# Patient Record
Sex: Male | Born: 1975
Health system: Southern US, Community
[De-identification: ages and names within clinical notes are randomized; demographics above are authoritative.]

## PROBLEM LIST (undated history)

## (undated) ENCOUNTER — Emergency Department (HOSPITAL_COMMUNITY): Discharge status: Absent without leave | Payer: Self-pay

## (undated) DIAGNOSIS — L309 Dermatitis, unspecified: Secondary | ICD-10-CM

## (undated) DIAGNOSIS — G43909 Migraine, unspecified, not intractable, without status migrainosus: Secondary | ICD-10-CM

## (undated) DIAGNOSIS — I10 Essential (primary) hypertension: Secondary | ICD-10-CM

---

## 2006-01-30 ENCOUNTER — Emergency Department (HOSPITAL_COMMUNITY): Admission: EM | Admit: 2006-01-30 | Discharge: 2006-01-30 | Payer: Self-pay | Admitting: Emergency Medicine

## 2006-04-17 ENCOUNTER — Emergency Department (HOSPITAL_COMMUNITY): Admission: EM | Admit: 2006-04-17 | Discharge: 2006-04-17 | Payer: Self-pay | Admitting: Emergency Medicine

## 2006-05-14 ENCOUNTER — Emergency Department (HOSPITAL_COMMUNITY): Admission: EM | Admit: 2006-05-14 | Discharge: 2006-05-14 | Payer: Self-pay | Admitting: Emergency Medicine

## 2006-10-16 ENCOUNTER — Emergency Department (HOSPITAL_COMMUNITY): Admission: EM | Admit: 2006-10-16 | Discharge: 2006-10-16 | Payer: Self-pay | Admitting: *Deleted

## 2007-04-08 ENCOUNTER — Emergency Department (HOSPITAL_COMMUNITY): Admission: EM | Admit: 2007-04-08 | Discharge: 2007-04-08 | Payer: Self-pay | Admitting: Emergency Medicine

## 2008-05-27 ENCOUNTER — Emergency Department (HOSPITAL_BASED_OUTPATIENT_CLINIC_OR_DEPARTMENT_OTHER): Admission: EM | Admit: 2008-05-27 | Discharge: 2008-05-27 | Payer: Self-pay | Admitting: Emergency Medicine

## 2008-06-05 ENCOUNTER — Emergency Department (HOSPITAL_BASED_OUTPATIENT_CLINIC_OR_DEPARTMENT_OTHER): Admission: EM | Admit: 2008-06-05 | Discharge: 2008-06-05 | Payer: Self-pay | Admitting: Emergency Medicine

## 2008-06-10 ENCOUNTER — Emergency Department (HOSPITAL_BASED_OUTPATIENT_CLINIC_OR_DEPARTMENT_OTHER): Admission: EM | Admit: 2008-06-10 | Discharge: 2008-06-10 | Payer: Self-pay | Admitting: Emergency Medicine

## 2008-06-14 ENCOUNTER — Emergency Department (HOSPITAL_COMMUNITY): Admission: EM | Admit: 2008-06-14 | Discharge: 2008-06-14 | Payer: Self-pay | Admitting: Emergency Medicine

## 2008-06-26 ENCOUNTER — Ambulatory Visit: Payer: Self-pay | Admitting: Physical Medicine & Rehabilitation

## 2009-11-24 ENCOUNTER — Emergency Department (HOSPITAL_COMMUNITY): Admission: EM | Admit: 2009-11-24 | Discharge: 2009-11-24 | Payer: Self-pay | Admitting: Emergency Medicine

## 2009-12-11 ENCOUNTER — Emergency Department (HOSPITAL_COMMUNITY): Admission: EM | Admit: 2009-12-11 | Discharge: 2009-12-11 | Payer: Self-pay | Admitting: Emergency Medicine

## 2010-03-21 ENCOUNTER — Emergency Department (HOSPITAL_BASED_OUTPATIENT_CLINIC_OR_DEPARTMENT_OTHER): Admission: EM | Admit: 2010-03-21 | Discharge: 2010-03-21 | Payer: Self-pay | Admitting: Emergency Medicine
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
## 2010-09-25 ENCOUNTER — Emergency Department (HOSPITAL_BASED_OUTPATIENT_CLINIC_OR_DEPARTMENT_OTHER)
Admission: EM | Admit: 2010-09-25 | Discharge: 2010-09-26 | Disposition: A | Discharge status: Home / Self Care | Payer: Self-pay | Attending: Emergency Medicine | Admitting: Emergency Medicine

## 2010-09-25 ENCOUNTER — Emergency Department (INDEPENDENT_AMBULATORY_CARE_PROVIDER_SITE_OTHER): Payer: Self-pay

## 2010-09-25 DIAGNOSIS — J9819 Other pulmonary collapse: Secondary | ICD-10-CM

## 2010-09-25 DIAGNOSIS — R42 Dizziness and giddiness: Secondary | ICD-10-CM

## 2010-09-25 DIAGNOSIS — R079 Chest pain, unspecified: Secondary | ICD-10-CM | POA: Insufficient documentation

## 2010-09-25 DIAGNOSIS — IMO0001 Reserved for inherently not codable concepts without codable children: Secondary | ICD-10-CM | POA: Insufficient documentation

## 2010-09-25 DIAGNOSIS — R252 Cramp and spasm: Secondary | ICD-10-CM | POA: Insufficient documentation

## 2010-09-25 DIAGNOSIS — F17200 Nicotine dependence, unspecified, uncomplicated: Secondary | ICD-10-CM | POA: Insufficient documentation

## 2010-09-25 LAB — DIFFERENTIAL
Basophils Relative: 0 % (ref 0–1)
Eosinophils Relative: 1 % (ref 0–5)
Lymphocytes Relative: 17 % (ref 12–46)
Monocytes Absolute: 0.8 10*3/uL (ref 0.1–1.0)
Monocytes Relative: 9 % (ref 3–12)
Neutro Abs: 6.5 10*3/uL (ref 1.7–7.7)

## 2010-09-25 LAB — CK TOTAL AND CKMB (NOT AT ARMC)
CK, MB: 2 ng/mL (ref 0.3–4.0)
Total CK: 85 U/L (ref 7–232)

## 2010-09-25 LAB — TROPONIN I: Troponin I: <0.3 ng/mL (ref <0.30)

## 2010-09-25 LAB — COMPREHENSIVE METABOLIC PANEL
Chloride: 102 mEq/L (ref 96–112)
Creatinine, Ser: 0.8 mg/dL (ref 0.4–1.5)
GFR calc Af Amer: >60 mL/min (ref >60)
Potassium: 3.6 mEq/L (ref 3.5–5.1)
Sodium: 139 mEq/L (ref 135–145)

## 2010-09-25 LAB — D-DIMER, QUANTITATIVE: D-Dimer, Quant: <0.22 ug/mL-FEU (ref 0.00–0.48)

## 2010-09-25 LAB — URINALYSIS, ROUTINE W REFLEX MICROSCOPIC
Glucose, UA: NEGATIVE mg/dL
Ketones, ur: NEGATIVE mg/dL
Protein, ur: NEGATIVE mg/dL
pH: 6 (ref 5.0–8.0)

## 2010-09-25 LAB — CBC
HCT: 43.2 % (ref 39.0–52.0)
Hemoglobin: 15.3 g/dL (ref 13.0–17.0)
MCH: 32.1 pg (ref 26.0–34.0)
RDW: 12.8 % (ref 11.5–15.5)

## 2010-09-25 LAB — CK: Total CK: 87 U/L (ref 7–232)

## 2011-01-06 ENCOUNTER — Encounter: Payer: Self-pay | Admitting: Emergency Medicine

## 2011-01-06 ENCOUNTER — Emergency Department (HOSPITAL_BASED_OUTPATIENT_CLINIC_OR_DEPARTMENT_OTHER)
Admission: EM | Admit: 2011-01-06 | Discharge: 2011-01-06 | Disposition: A | Discharge status: Home / Self Care | Payer: Self-pay | Attending: Emergency Medicine | Admitting: Emergency Medicine

## 2011-01-06 DIAGNOSIS — B356 Tinea cruris: Secondary | ICD-10-CM | POA: Insufficient documentation

## 2011-01-06 DIAGNOSIS — L259 Unspecified contact dermatitis, unspecified cause: Secondary | ICD-10-CM | POA: Insufficient documentation

## 2011-01-06 LAB — GLUCOSE, CAPILLARY: Glucose-Capillary: 103 mg/dL — ABNORMAL HIGH (ref 70–99)

## 2011-01-06 MED ORDER — TERBINAFINE HCL 1 % EX CREA: TOPICAL_CREAM | Freq: Two times a day (BID) | CUTANEOUS | Status: DC

## 2011-01-06 MED ORDER — TRIAMCINOLONE ACETONIDE 0.1 % EX CREA: TOPICAL_CREAM | Freq: Two times a day (BID) | CUTANEOUS | Status: DC

## 2011-01-06 NOTE — ED Provider Notes (Signed)
History     CSN: [id number] Arrival date & time: 01/06/2011  7:08 PM  Chief Complaint  Patient presents with  . Rash   HPI Comments: Pt states that he first had a rash to his left thigh over the last couple of months and then in the last month he has developed an area to his between is buttocks that is agitated and causing discomfort:pt states that he has tried all kinds of otc creams without relief  Patient is a 35 y.o. male presenting with rash. The history is provided by the patient. No language interpreter was used.  Rash  This is a new problem. The current episode started more than 1 week ago. The problem has been gradually worsening. The problem is associated with an unknown factor. There has been no fever. The patient is experiencing no pain. Associated symptoms include itching. Pertinent negatives include no blisters, no pain and no weeping. He has tried antibiotic cream, antihistamines, anti-itch cream and steriods for the symptoms.    History reviewed. No pertinent past medical history.  History reviewed. No pertinent past surgical history.  No family history on file.  History  Substance Use Topics  . Smoking status: Current Everyday Smoker  . Smokeless tobacco: Not on file  . Alcohol Use: No      Review of Systems  HENT: Negative.   Respiratory: Negative.   Cardiovascular: Negative.   Musculoskeletal: Negative.   Skin: Positive for itching and rash.    Physical Exam  BP 129/73  Pulse 92  Temp(Src) 98.8 F (37.1 C) (Oral)  Resp 18  SpO2 97%  Physical Exam  Nursing note and vitals reviewed. Constitutional: He is oriented to person, place, and time. He appears well-developed and well-nourished.  HENT:  Head: Normocephalic and atraumatic.  Cardiovascular: Normal rate and regular rhythm.   Musculoskeletal: Normal range of motion. He exhibits edema.  Neurological: He is alert and oriented to person, place, and time.  Skin:       Pt has red raised papules to  the left thigh:pt has a erythematous rash to the area between his buttock:no rash noted to the groin area  Psychiatric: He has a normal mood and affect.    ED Course  Procedures  MDM Pt appear to have 2 separate rashes thigh consistent with contact:pt appear to have tinea crusis in the buttock area;pt doesn't have diabetes      Teressa Lower, NP 01/06/11 1948

## 2011-01-06 NOTE — ED Notes (Signed)
Rash on left upper leg, left hip and buttock.

## 2011-01-18 NOTE — ED Provider Notes (Signed)
Evaluation and management procedures were performed by the PA/NP under my supervision/collaboration.   Dione Booze, MD 01/18/11 1229

## 2011-02-06 LAB — DIFFERENTIAL
Basophils Absolute: 0.1
Eosinophils Absolute: 0.1 — ABNORMAL LOW
Neutro Abs: 7.3
Neutrophils Relative %: 75

## 2011-02-06 LAB — URINALYSIS, ROUTINE W REFLEX MICROSCOPIC
Glucose, UA: NEGATIVE
Ketones, ur: NEGATIVE
Nitrite: NEGATIVE
Protein, ur: NEGATIVE
Urobilinogen, UA: 1
pH: 7.5

## 2011-02-06 LAB — COMPREHENSIVE METABOLIC PANEL
AST: 43 — ABNORMAL HIGH
Albumin: 4.2
BUN: 15
CO2: 27
Calcium: 9.3
Chloride: 104
Creatinine, Ser: 1
GFR calc non Af Amer: >60
Glucose, Bld: 104 — ABNORMAL HIGH
Potassium: 4.2
Total Protein: 6.9

## 2011-02-06 LAB — CBC
MCHC: 34.8
Platelets: 199

## 2011-02-15 LAB — URINALYSIS, ROUTINE W REFLEX MICROSCOPIC
Hgb urine dipstick: NEGATIVE
Nitrite: NEGATIVE
Specific Gravity, Urine: 1.018
Urobilinogen, UA: 0.2
pH: 6.5

## 2011-02-15 LAB — COMPREHENSIVE METABOLIC PANEL
ALT: 69 — ABNORMAL HIGH
AST: 53 — ABNORMAL HIGH
Albumin: 4.3
Alkaline Phosphatase: 42
BUN: 10
CO2: 25
Calcium: 9.4
Chloride: 102
Creatinine, Ser: 0.97
GFR calc Af Amer: >60
GFR calc non Af Amer: >60
Glucose, Bld: 86
Potassium: 4.2
Sodium: 136
Total Bilirubin: 1.9 — ABNORMAL HIGH
Total Protein: 6.7

## 2011-02-15 LAB — RAPID URINE DRUG SCREEN, HOSP PERFORMED
Barbiturates: NOT DETECTED
Opiates: NOT DETECTED

## 2011-02-15 LAB — CBC
HCT: 47.7
MCHC: 34.4
Platelets: 216
RDW: 12.4

## 2011-02-15 LAB — URINE MICROSCOPIC-ADD ON

## 2011-10-15 ENCOUNTER — Emergency Department (HOSPITAL_BASED_OUTPATIENT_CLINIC_OR_DEPARTMENT_OTHER): Payer: Self-pay

## 2011-10-15 ENCOUNTER — Encounter (HOSPITAL_BASED_OUTPATIENT_CLINIC_OR_DEPARTMENT_OTHER): Payer: Self-pay

## 2011-10-15 ENCOUNTER — Emergency Department (HOSPITAL_BASED_OUTPATIENT_CLINIC_OR_DEPARTMENT_OTHER)
Admission: EM | Admit: 2011-10-15 | Discharge: 2011-10-15 | Disposition: A | Discharge status: Home / Self Care | Payer: Self-pay | Attending: Emergency Medicine | Admitting: Emergency Medicine

## 2011-10-15 DIAGNOSIS — M25529 Pain in unspecified elbow: Secondary | ICD-10-CM | POA: Insufficient documentation

## 2011-10-15 DIAGNOSIS — F17200 Nicotine dependence, unspecified, uncomplicated: Secondary | ICD-10-CM | POA: Insufficient documentation

## 2011-10-15 DIAGNOSIS — M779 Enthesopathy, unspecified: Secondary | ICD-10-CM

## 2011-10-15 MED ORDER — DICLOFENAC SODIUM 75 MG PO TBEC: 75.0000 mg | DELAYED_RELEASE_TABLET | Freq: Two times a day (BID) | ORAL | Status: DC

## 2011-10-15 MED ORDER — HYDROCODONE-ACETAMINOPHEN 5-325 MG PO TABS: 1.0000 | ORAL_TABLET | Freq: Four times a day (QID) | ORAL | Status: DC | PRN

## 2011-10-15 MED ORDER — KETOROLAC TROMETHAMINE 60 MG/2ML IM SOLN
60.0000 mg | Freq: Once | INTRAMUSCULAR | Status: AC
  Administered 2011-10-15: 60 mg via INTRAMUSCULAR
  Filled 2011-10-15: qty 2

## 2011-10-15 MED ORDER — HYDROCODONE-ACETAMINOPHEN 5-325 MG PO TABS: 1.0000 | ORAL_TABLET | Freq: Four times a day (QID) | ORAL | Status: AC | PRN

## 2011-10-15 MED ORDER — LOPERAMIDE HCL 2 MG PO TABS: 2.0000 mg | ORAL_TABLET | Freq: Four times a day (QID) | ORAL | Status: DC | PRN

## 2011-10-15 MED ORDER — HYDROMORPHONE HCL PF 1 MG/ML IJ SOLN: 0.5000 mg | Freq: Once | INTRAMUSCULAR | Status: DC

## 2011-10-15 NOTE — ED Notes (Signed)
Patient reports that he developed right elbow pain this am after moving furniture yesterday. Denies any other trauma. Pain with any movement and tender to touch, minimal to no swelling

## 2011-10-15 NOTE — ED Provider Notes (Signed)
History     CSN: [id number]  Arrival date & time 10/15/11  1644   First MD Initiated Contact with Patient 10/15/11 1837      6:54 PM HPI Patient presents today complaining of right elbow pain. Reports he woke up with pain this morning. Points to pain located in the medial. Reports painful to move or palpate. States pain began after moving heavy furniture yesterday. Denies erythema, swelling, traumatic injury. Denies numbness, tingling, weakness. Reports no improvement with over-the-counter medication.  The history is provided by the patient.    History reviewed. No pertinent past medical history.  History reviewed. No pertinent past surgical history.  No family history on file.  History  Substance Use Topics  . Smoking status: Current Everyday Smoker  . Smokeless tobacco: Not on file  . Alcohol Use: No      Review of Systems  HENT: Negative for neck pain.   Musculoskeletal: Negative for back pain and joint swelling.       Elbow pain  Skin: Negative for color change and wound.  Neurological: Negative for weakness and numbness.  All other systems reviewed and are negative.    Allergies  Mushroom extract complex  Home Medications   Current Outpatient Rx  Name Route Sig Dispense Refill  . IBUPROFEN 200 MG PO TABS Oral Take 600 mg by mouth 2 (two) times daily as needed. For pain    . MELATONIN 3 MG PO TABS Oral Take 1 tablet by mouth at bedtime as needed. For sleep    . ADULT MULTIVITAMIN W/MINERALS CH Oral Take 1 tablet by mouth daily.      BP 126/84  Pulse 87  Temp 99.1 F (37.3 C)  Resp 18  SpO2 99%  Physical Exam  Constitutional: He is oriented to person, place, and time. He appears well-developed and well-nourished.  HENT:  Head: Normocephalic and atraumatic.  Eyes: Pupils are equal, round, and reactive to light.  Musculoskeletal:       Right elbow: He exhibits decreased range of motion. He exhibits no swelling, no effusion, no deformity and no  laceration. tenderness found. Medial epicondyle tenderness noted. No radial head, no lateral epicondyle and no olecranon process tenderness noted.       Arms: Neurological: He is alert and oriented to person, place, and time.  Skin: Skin is warm and dry. No rash noted. No erythema. No pallor.  Psychiatric: He has a normal mood and affect. His behavior is normal.    ED Course  Procedures Dg Elbow Complete Right  10/15/2011  *RADIOLOGY REPORT*  Clinical Data: Right elbow pain.  No injury.  RIGHT ELBOW - COMPLETE 3+ VIEW  Comparison: None.  Findings: Anatomic alignment of the right elbow.  No effusion.  No fracture identified.  Eccentric position of the radial head could represent an old radial head or neck fracture.  No acute fracture is identified.  IMPRESSION: No acute osseous abnormality.  Original Report Authenticated By: Andreas Newport, M.D.      MDM   Patient likely has tendinitis of his right elbow. Advised Ace wrap and gentle range of motion exercises. We'll give prescription of analgesics as well as anti-inflammatory medications. Advised to medications ran out he should continue to take anti-inflammatory medication. Will refer patient to orthopedic physician for further evaluation if needed for pain. Patient voices understanding and is ready for discharge.      Thomasene Lot, PA-C 10/15/11 2050

## 2011-10-15 NOTE — Discharge Instructions (Signed)
Tendinitis Tendinitis is swelling and inflammation of the tendons. Tendons are band-like tissues that connect muscle to bone. Tendinitis commonly occurs in the:   Shoulders (rotator cuff).   Heels (Achilles tendon).   Elbows (triceps tendon).  CAUSES Tendinitis is usually caused by overusing the tendon, muscles, and joints involved. When the tissue surrounding a tendon (synovium) becomes inflamed, it is called tenosynovitis. Tendinitis commonly develops in people whose jobs require repetitive motions. SYMPTOMS  Pain.   Tenderness.   Mild swelling.  DIAGNOSIS Tendinitis is usually diagnosed by physical exam. Your caregiver may also order X-rays or other imaging tests. TREATMENT Your caregiver may recommend certain medicines or exercises for your treatment. HOME CARE INSTRUCTIONS   Use a sling or splint for as long as directed by your caregiver until the pain decreases.   Put ice on the injured area.   Put ice in a plastic bag.   Place a towel between your skin and the bag.   Leave the ice on for 15 to 20 minutes, 3 to 4 times a day.   Avoid using the limb while the tendon is painful. Perform gentle range of motion exercises only as directed by your caregiver. Stop exercises if pain or discomfort increase, unless directed otherwise by your caregiver.   Only take over-the-counter or prescription medicines for pain, discomfort, or fever as directed by your caregiver.  SEEK MEDICAL CARE IF:   Your pain and swelling increase.   You develop new, unexplained symptoms, especially increased numbness in the hands.  MAKE SURE YOU:   Understand these instructions.   Will watch your condition.   Will get help right away if you are not doing well or get worse.  Document Released: 04/14/2000 Document Revised: 04/06/2011 Document Reviewed: 07/04/2010 [facility] Patient Information 2012 [place], [place].Tendinitis Tendinitis is swelling and inflammation of the tendons. Tendons are  band-like tissues that connect muscle to bone. Tendinitis commonly occurs in the:   Shoulders (rotator cuff).   Heels (Achilles tendon).   Elbows (triceps tendon).  CAUSES Tendinitis is usually caused by overusing the tendon, muscles, and joints involved. When the tissue surrounding a tendon (synovium) becomes inflamed, it is called tenosynovitis. Tendinitis commonly develops in people whose jobs require repetitive motions. SYMPTOMS  Pain.   Tenderness.   Mild swelling.  DIAGNOSIS Tendinitis is usually diagnosed by physical exam. Your caregiver may also order X-rays or other imaging tests. TREATMENT Your caregiver may recommend certain medicines or exercises for your treatment. HOME CARE INSTRUCTIONS   Use a sling or splint for as long as directed by your caregiver until the pain decreases.   Put ice on the injured area.   Put ice in a plastic bag.   Place a towel between your skin and the bag.   Leave the ice on for 15 to 20 minutes, 3 to 4 times a day.   Avoid using the limb while the tendon is painful. Perform gentle range of motion exercises only as directed by your caregiver. Stop exercises if pain or discomfort increase, unless directed otherwise by your caregiver.   Only take over-the-counter or prescription medicines for pain, discomfort, or fever as directed by your caregiver.  SEEK MEDICAL CARE IF:   Your pain and swelling increase.   You develop new, unexplained symptoms, especially increased numbness in the hands.  MAKE SURE YOU:   Understand these instructions.   Will watch your condition.   Will get help right away if you are not doing well or get worse.  Document Released: 04/14/2000 Document Revised: 04/06/2011 Document Reviewed: 07/04/2010 [facility] Patient Information 2012 [place], [place].Tendinitis Tendinitis is swelling and inflammation of the tendons. Tendons are band-like tissues that connect muscle to bone. Tendinitis commonly occurs in the:    Shoulders (rotator cuff).   Heels (Achilles tendon).   Elbows (triceps tendon).  CAUSES Tendinitis is usually caused by overusing the tendon, muscles, and joints involved. When the tissue surrounding a tendon (synovium) becomes inflamed, it is called tenosynovitis. Tendinitis commonly develops in people whose jobs require repetitive motions. SYMPTOMS  Pain.   Tenderness.   Mild swelling.  DIAGNOSIS Tendinitis is usually diagnosed by physical exam. Your caregiver may also order X-rays or other imaging tests. TREATMENT Your caregiver may recommend certain medicines or exercises for your treatment. HOME CARE INSTRUCTIONS   Use a sling or splint for as long as directed by your caregiver until the pain decreases.   Put ice on the injured area.   Put ice in a plastic bag.   Place a towel between your skin and the bag.   Leave the ice on for 15 to 20 minutes, 3 to 4 times a day.   Avoid using the limb while the tendon is painful. Perform gentle range of motion exercises only as directed by your caregiver. Stop exercises if pain or discomfort increase, unless directed otherwise by your caregiver.   Only take over-the-counter or prescription medicines for pain, discomfort, or fever as directed by your caregiver.  SEEK MEDICAL CARE IF:   Your pain and swelling increase.   You develop new, unexplained symptoms, especially increased numbness in the hands.  MAKE SURE YOU:   Understand these instructions.   Will watch your condition.   Will get help right away if you are not doing well or get worse.  Document Released: 04/14/2000 Document Revised: 04/06/2011 Document Reviewed: 07/04/2010 [facility] Patient Information 2012 [place], [place].

## 2011-10-17 NOTE — ED Provider Notes (Signed)
Medical screening examination/treatment/procedure(s) were performed by non-physician practitioner and as supervising physician I was immediately available for consultation/collaboration.    Bartt Gonzaga L Blondina Coderre, MD 10/17/11 0707 

## 2012-03-04 ENCOUNTER — Emergency Department (HOSPITAL_COMMUNITY)
Admission: EM | Admit: 2012-03-04 | Discharge: 2012-03-04 | Disposition: A | Discharge status: Home / Self Care | Payer: Self-pay | Attending: Emergency Medicine | Admitting: Emergency Medicine

## 2012-03-04 ENCOUNTER — Encounter (HOSPITAL_COMMUNITY): Payer: Self-pay | Admitting: Emergency Medicine

## 2012-03-04 DIAGNOSIS — Z79899 Other long term (current) drug therapy: Secondary | ICD-10-CM | POA: Insufficient documentation

## 2012-03-04 DIAGNOSIS — IMO0001 Reserved for inherently not codable concepts without codable children: Secondary | ICD-10-CM | POA: Insufficient documentation

## 2012-03-04 DIAGNOSIS — Z8739 Personal history of other diseases of the musculoskeletal system and connective tissue: Secondary | ICD-10-CM | POA: Insufficient documentation

## 2012-03-04 DIAGNOSIS — M791 Myalgia, unspecified site: Secondary | ICD-10-CM

## 2012-03-04 DIAGNOSIS — M549 Dorsalgia, unspecified: Secondary | ICD-10-CM | POA: Insufficient documentation

## 2012-03-04 DIAGNOSIS — F17200 Nicotine dependence, unspecified, uncomplicated: Secondary | ICD-10-CM | POA: Insufficient documentation

## 2012-03-04 MED ORDER — IBUPROFEN 600 MG PO TABS: 600.0000 mg | ORAL_TABLET | Freq: Four times a day (QID) | ORAL | Status: DC | PRN

## 2012-03-04 MED ORDER — HYDROCODONE-ACETAMINOPHEN 5-500 MG PO TABS: 1.0000 | ORAL_TABLET | Freq: Four times a day (QID) | ORAL | Status: DC | PRN

## 2012-03-04 NOTE — ED Provider Notes (Signed)
History  This chart was scribed for Suzi Roots, MD by Shari Heritage. The patient was seen in room TR04C/TR04C. Patient's care was started at 0819.     CSN: [id number]  Arrival date & time 03/04/12  4098   First MD Initiated Contact with Patient 03/04/12 [phone]      Chief Complaint  Patient presents with  . Back Pain    The history is provided by the patient. No language interpreter was used.  HPI Comments: Patrick [name] is a 36 y.o. male with who presents to the Emergency Department complaining of constant, left middle back pain onset 1 week ago. Patient describes the pain as stabbing, sore and achy and states that it radiates down. Patient also says that he occasionally experiences a "pins and needles" sensation in his legs. Patient denies fever or numbness. No weakness He denies any obvious recent injury or trauma, but says he got a new mattress about 1 month ago. Patient states that a chiropractor told him he had alignment issues years ago, but patient has never followed up on this. Patient has tried Flexeril, Done's, Ibuprofen, Aspirin and heat for pain relief. Patient says that he has a history of lower back pain, but current pain is in a different area. He reports no other significant past medical or surgical history. No gi or gu c/o   History reviewed. No pertinent past medical history.  History reviewed. No pertinent past surgical history.  History reviewed. No pertinent family history.  History  Substance Use Topics  . Smoking status: Current Every Day Smoker  . Smokeless tobacco: Not on file  . Alcohol Use: Yes      Review of Systems  Constitutional: Negative for fever.  Musculoskeletal: Positive for back pain.  Neurological: Negative for numbness.    Allergies  Mushroom extract complex  Home Medications   Current Outpatient Rx  Name  Route  Sig  Dispense  Refill  . ASPIRIN 325 MG PO TABS   Oral   Take 650 mg by mouth every 2 (two) hours as needed. For  pain         . IBUPROFEN 200 MG PO TABS   Oral   Take 800-1,000 mg by mouth every 2 (two) hours as needed. For pain         . ADULT MULTIVITAMIN W/MINERALS CH   Oral   Take 2 tablets by mouth at bedtime.          Marland Kitchen OVER THE COUNTER MEDICATION   Oral   Take 2 tablets by mouth every 6 (six) hours as needed. Doans  For pain           BP 129/94  Pulse 90  Temp 97.2 F (36.2 C) (Oral)  Resp 20  SpO2 99%  Physical Exam  Nursing note and vitals reviewed. Constitutional: He is oriented to person, place, and time. He appears well-developed and well-nourished. No distress.  HENT:  Head: Normocephalic and atraumatic.  Eyes: Conjunctivae normal are normal.  Neck: Normal range of motion. Neck supple.  Cardiovascular: Normal rate.   Pulmonary/Chest: Effort normal.  Musculoskeletal: Normal range of motion. He exhibits tenderness. He exhibits no edema.       Spine nontender. Left mid back tenderness especially below the left scapula.  CTLS spine, non tender, aligned, no step off.   Neurological: He is alert and oriented to person, place, and time. No sensory deficit.       Motor intact bi. Steady gait.  Skin: No rash noted.  Psychiatric: He has a normal mood and affect. His behavior is normal.    ED Course  Procedures (including critical care time) DIAGNOSTIC STUDIES: Oxygen Saturation is 99% on room air, normal by my interpretation.    COORDINATION OF CARE: 8:22am- Patient informed of current plan for treatment and evaluation and agrees with plan at this time.        MDM  I personally performed the services described in this documentation, which was scribed in my presence. The recorded information has been reviewed and considered. Suzi Roots, MD  Spine non tender. Muscular tenderness left mid back. rx motrin, vicodin, heat, massage.    Suzi Roots, MD 03/04/12 [phone]

## 2012-03-04 NOTE — ED Notes (Signed)
Pt c/o 8/10 sharp, stabbing back pain to mid-left back onset one week ago. Pt has been taking ibuprofen for one week, doan's pain pill for three days, and flexeril for two day with no relief. Pt A&Ox4, ambulatory, full movement.

## 2012-03-04 NOTE — ED Notes (Signed)
Pt c/o 8/10 sharp, stabbing back pain to mid-left back since 1 week ago. Pt denies trauma, heavy lifting, or recent strain to back. Use of heating pad and Bengay patch along with pain meds had not effect.

## 2012-03-11 ENCOUNTER — Encounter (HOSPITAL_COMMUNITY): Payer: Self-pay | Admitting: Emergency Medicine

## 2012-03-11 ENCOUNTER — Emergency Department (HOSPITAL_COMMUNITY)
Admission: EM | Admit: 2012-03-11 | Discharge: 2012-03-11 | Disposition: A | Discharge status: Home / Self Care | Payer: Self-pay | Attending: Emergency Medicine | Admitting: Emergency Medicine

## 2012-03-11 ENCOUNTER — Emergency Department (HOSPITAL_COMMUNITY): Payer: Self-pay

## 2012-03-11 DIAGNOSIS — M549 Dorsalgia, unspecified: Secondary | ICD-10-CM | POA: Insufficient documentation

## 2012-03-11 DIAGNOSIS — F17200 Nicotine dependence, unspecified, uncomplicated: Secondary | ICD-10-CM | POA: Insufficient documentation

## 2012-03-11 MED ORDER — NAPROXEN 500 MG PO TABS: 500.0000 mg | ORAL_TABLET | Freq: Two times a day (BID) | ORAL | Status: DC

## 2012-03-11 MED ORDER — CYCLOBENZAPRINE HCL 10 MG PO TABS: 10.0000 mg | ORAL_TABLET | Freq: Two times a day (BID) | ORAL | Status: DC | PRN

## 2012-03-11 MED ORDER — HYDROCODONE-ACETAMINOPHEN 5-325 MG PO TABS: 1.0000 | ORAL_TABLET | Freq: Four times a day (QID) | ORAL | Status: DC | PRN

## 2012-03-11 NOTE — ED Provider Notes (Signed)
History     CSN: [id number]  Arrival date & time 03/11/12  6213   First MD Initiated Contact with Patient 03/11/12 [phone]      Chief Complaint  Patient presents with  . Back Pain    (Consider location/radiation/quality/duration/timing/severity/associated sxs/prior treatment) Patient is a 36 y.o. male presenting with back pain. The history is provided by the patient.  Back Pain  Pertinent negatives include no chest pain, no fever, no headaches and no dysuria.   patient presents with a complaint of mid back pain. Greater on the left than the right pain at worst is 10 out of 10 currently pain is about a 6/10. Patient was seen for same on November 4 treated with pain medications and anti-inflammatory medicines. Symptoms have not gotten better. Patient does not have any neuro focal deficits. No incontinence no fever. No history of injury. The pain is described as sharp ache. Nonradiating.  History reviewed. No pertinent past medical history.  History reviewed. No pertinent past surgical history.  No family history on file.  History  Substance Use Topics  . Smoking status: Current Every Day Smoker  . Smokeless tobacco: Not on file  . Alcohol Use: Yes      Review of Systems  Constitutional: Negative for fever.  HENT: Negative for neck pain.   Eyes: Negative for visual disturbance.  Respiratory: Negative for shortness of breath.   Cardiovascular: Negative for chest pain.  Gastrointestinal: Negative for vomiting and abdominal distention.  Genitourinary: Negative for dysuria.  Musculoskeletal: Positive for back pain.  Skin: Negative for rash.  Neurological: Negative for headaches.  Hematological: Does not bruise/bleed easily.    Allergies  Mushroom extract complex  Home Medications   Current Outpatient Rx  Name  Route  Sig  Dispense  Refill  . HYDROCODONE-ACETAMINOPHEN 5-500 MG PO TABS   Oral   Take 1-2 tablets by mouth every 6 (six) hours as needed for pain.   20  tablet   0   . IBUPROFEN 600 MG PO TABS   Oral   Take 1 tablet (600 mg total) by mouth every 6 (six) hours as needed for pain. Take with food.   20 tablet   0   . ADULT MULTIVITAMIN W/MINERALS CH   Oral   Take 2 tablets by mouth at bedtime.          . NYQUIL PO   Oral   Take 2 capsules by mouth at bedtime as needed. To help sleep.         . CYCLOBENZAPRINE HCL 10 MG PO TABS   Oral   Take 1 tablet (10 mg total) by mouth 2 (two) times daily as needed for muscle spasms.   20 tablet   0   . HYDROCODONE-ACETAMINOPHEN 5-325 MG PO TABS   Oral   Take 1-2 tablets by mouth every 6 (six) hours as needed for pain.   20 tablet   0   . NAPROXEN 500 MG PO TABS   Oral   Take 1 tablet (500 mg total) by mouth 2 (two) times daily.   14 tablet   0     BP 130/90  Pulse 90  Temp 97.6 F (36.4 C) (Oral)  Resp 18  Ht 5\' 11"  (1.803 m)  Wt 190 lb (86.183 kg)  BMI 26.50 kg/m2  SpO2 99%  Physical Exam  Nursing note and vitals reviewed. Constitutional: He is oriented to person, place, and time. He appears well-developed and well-nourished.  HENT:  Head:  Normocephalic and atraumatic.  Eyes: Conjunctivae normal and EOM are normal. Pupils are equal, round, and reactive to light.  Neck: Normal range of motion. Neck supple.  Pulmonary/Chest: Effort normal and breath sounds normal.  Abdominal: Soft. Bowel sounds are normal.  Musculoskeletal: Normal range of motion. He exhibits no tenderness.       Back is nontender no point tenderness no muscle spasm.  Neurological: He is alert and oriented to person, place, and time. No cranial nerve deficit. He exhibits normal muscle tone. Coordination normal.  Skin: Skin is warm. No rash noted.    ED Course  Procedures (including critical care time)  Labs Reviewed - No data to display Dg Thoracic Spine 2 View  03/11/2012  *RADIOLOGY REPORT*  Clinical Data: Mid and lower back pain, pain with movement  THORACIC SPINE - 2 VIEW  Comparison: Chest  radiograph 09/25/2010  Findings: 11 pairs of ribs. Mild broad-based dextroconvex thoracic scoliosis at upper thoracic spine. Vertebral body and disc space heights maintained. No acute fracture, subluxation or bone destruction. Visualized posterior ribs intact.  IMPRESSION: No acute thoracic spine abnormalities.   Original Report Authenticated By: Ulyses Southward, M.D.    Dg Lumbar Spine Complete  03/11/2012  *RADIOLOGY REPORT*  Clinical Data: Mid and lower back pain, pain with movement  LUMBAR SPINE - COMPLETE 4+ VIEW  Comparison: None  Findings: Five non-rib bearing lumbar vertebrae. Vertebral body and disc space heights maintained. No acute fracture, subluxation or bone destruction. No spondylolysis. SI joints symmetric.  IMPRESSION: No acute lumbar spine abnormalities.   Original Report Authenticated By: Ulyses Southward, M.D.      1. Back pain       MDM  X-rays are negative for any bony abnormalities. Most likely musculoskeletal back pain that's persisting. If it does not get better in a few weeks will require MRI. Patient will be treated with Flexeril, Naprosyn, and hydrocodone.        Shelda Jakes, MD 03/11/12 [phone]

## 2012-03-11 NOTE — ED Notes (Signed)
Pt c/o mid back pain X2w, seen last week for same, no other complaints, ambulatory to dep, NAD

## 2012-03-20 ENCOUNTER — Emergency Department (INDEPENDENT_AMBULATORY_CARE_PROVIDER_SITE_OTHER)
Admission: EM | Admit: 2012-03-20 | Discharge: 2012-03-20 | Disposition: A | Discharge status: Home / Self Care | Payer: Self-pay | Source: Home / Self Care

## 2012-03-20 ENCOUNTER — Encounter (HOSPITAL_COMMUNITY): Payer: Self-pay | Admitting: *Deleted

## 2012-03-20 DIAGNOSIS — IMO0001 Reserved for inherently not codable concepts without codable children: Secondary | ICD-10-CM

## 2012-03-20 DIAGNOSIS — S29012A Strain of muscle and tendon of back wall of thorax, initial encounter: Secondary | ICD-10-CM

## 2012-03-20 DIAGNOSIS — M549 Dorsalgia, unspecified: Secondary | ICD-10-CM

## 2012-03-20 DIAGNOSIS — M791 Myalgia, unspecified site: Secondary | ICD-10-CM

## 2012-03-20 DIAGNOSIS — G8929 Other chronic pain: Secondary | ICD-10-CM

## 2012-03-20 DIAGNOSIS — S239XXA Sprain of unspecified parts of thorax, initial encounter: Secondary | ICD-10-CM

## 2012-03-20 MED ORDER — KETOROLAC TROMETHAMINE 60 MG/2ML IM SOLN
INTRAMUSCULAR | Status: AC
  Filled 2012-03-20: qty 2

## 2012-03-20 MED ORDER — KETOROLAC TROMETHAMINE 60 MG/2ML IM SOLN
60.0000 mg | Freq: Once | INTRAMUSCULAR | Status: AC
  Administered 2012-03-20: 60 mg via INTRAMUSCULAR

## 2012-03-20 MED ORDER — TRAMADOL HCL 50 MG PO TABS: 50.0000 mg | ORAL_TABLET | Freq: Four times a day (QID) | ORAL | Status: DC | PRN

## 2012-03-20 MED ORDER — NAPROXEN 500 MG PO TBEC: 500.0000 mg | DELAYED_RELEASE_TABLET | Freq: Two times a day (BID) | ORAL | Status: DC

## 2012-03-20 NOTE — ED Provider Notes (Signed)
History     CSN: [id number]  Arrival date & time 03/20/12  4098   None     Chief Complaint  Patient presents with  . Back Pain    (Consider location/radiation/quality/duration/timing/severity/associated sxs/prior treatment) HPI Comments: This is the third visit to the [facility] health system for back pain at this patient's had for 3-4 weeks. He is uncertain as to how he developed the back pain but he does work with. The pain is primarily in the upper left back. He has tenderness in the. Scapular musculature and left para thoracic musculature. He is either unable or unwilling to abduct his arms above 70. He states most movement of any kind produces the pain. He also states that he visited the ER here twice was given muscle relaxants, NSAIDs, and Lortab. He said the muscle relaxants did not do much to help relieve the pain. He took himself out of work for a week and did improve some when he went back to work he started developing increased back pain after approximately an hour and a half. He denies distal paresthesias or motor weakness. He denies any known trauma or single event but produces pain.   History reviewed. No pertinent past medical history.  History reviewed. No pertinent past surgical history.  No family history on file.  History  Substance Use Topics  . Smoking status: Current Every Day Smoker  . Smokeless tobacco: Not on file  . Alcohol Use: Yes      Review of Systems  Constitutional: Negative.   Respiratory: Negative.   Gastrointestinal: Negative.   Genitourinary: Negative.   Musculoskeletal:       As per HPI  Skin: Negative.   Neurological: Negative for dizziness, weakness, numbness and headaches.    Allergies  Mushroom extract complex  Home Medications   Current Outpatient Rx  Name  Route  Sig  Dispense  Refill  . CYCLOBENZAPRINE HCL 10 MG PO TABS   Oral   Take 1 tablet (10 mg total) by mouth 2 (two) times daily as needed for muscle spasms.   20  tablet   0   . HYDROCODONE-ACETAMINOPHEN 5-325 MG PO TABS   Oral   Take 1-2 tablets by mouth every 6 (six) hours as needed for pain.   20 tablet   0   . HYDROCODONE-ACETAMINOPHEN 5-500 MG PO TABS   Oral   Take 1-2 tablets by mouth every 6 (six) hours as needed for pain.   20 tablet   0   . IBUPROFEN 600 MG PO TABS   Oral   Take 1 tablet (600 mg total) by mouth every 6 (six) hours as needed for pain. Take with food.   20 tablet   0   . ADULT MULTIVITAMIN W/MINERALS CH   Oral   Take 2 tablets by mouth at bedtime.          Marland Kitchen NAPROXEN 500 MG PO TBEC   Oral   Take 1 tablet (500 mg total) by mouth 2 (two) times daily with a meal.   20 tablet   0   . NAPROXEN 500 MG PO TABS   Oral   Take 1 tablet (500 mg total) by mouth 2 (two) times daily.   14 tablet   0   . NYQUIL PO   Oral   Take 2 capsules by mouth at bedtime as needed. To help sleep.         Marland Kitchen TRAMADOL HCL 50 MG PO TABS   Oral  Take 1 tablet (50 mg total) by mouth every 6 (six) hours as needed for pain.   25 tablet   0     BP 123/86  Pulse 94  Temp 98.8 F (37.1 C) (Oral)  Resp 22  SpO2 98%  Physical Exam  Constitutional: He is oriented to person, place, and time. He appears well-developed and well-nourished.  HENT:  Head: Normocephalic and atraumatic.  Eyes: EOM are normal. Left eye exhibits no discharge.  Neck: Normal range of motion. Neck supple.  Musculoskeletal:       There is marked tenderness in the left parathoracic and care he scapular musculature including the trapezius. There is also mild discomfort in the right upper back musculature. Denies tenderness or pain in the low back although he states that sometimes it feels as though the pain in the upper back radiates all the way down to his toes. He does not described radicular type pain. Unable to move his right arm across his chest because it hurts the left upper back.  Neurological: He is alert and oriented to person, place, and time. No  cranial nerve deficit.  Skin: Skin is warm and dry.  Psychiatric: He has a normal mood and affect.    ED Course  Procedures (including critical care time)  Labs Reviewed - No data to display No results found.   1. Chronic upper back pain   2. Myalgia   3. Upper back strain       MDM  Toradol 60 mg IM now Ultram 50 mg one q. 6 hours when necessary pain Naprosyn EC 500 mg twice a day for Heat No overhead working lifting. Call [phone] for appointment with the adult care center        [name] Rasmussen, NP 03/20/12 1155

## 2012-03-20 NOTE — ED Notes (Signed)
Pt  Seen  Er  x2  Over  Last  Several weeks  For  Back  Pain    -  He  Continues  To  Have  Symptoms      He  denys  Any  Recent injury  He  Reports  The  Pain is  Worse on movement and  position

## 2012-03-20 NOTE — ED Provider Notes (Signed)
Medical screening examination/treatment/procedure(s) were performed by non-physician practitioner and as supervising physician I was immediately available for consultation/collaboration.   [facility]; MD   Sharin Grave, MD 03/20/12 [phone]

## 2012-03-27 ENCOUNTER — Encounter (HOSPITAL_COMMUNITY): Payer: Self-pay

## 2012-03-27 ENCOUNTER — Emergency Department (HOSPITAL_COMMUNITY)
Admission: EM | Admit: 2012-03-27 | Discharge: 2012-03-27 | Disposition: A | Discharge status: Home / Self Care | Payer: Self-pay | Source: Home / Self Care

## 2012-03-27 DIAGNOSIS — M549 Dorsalgia, unspecified: Secondary | ICD-10-CM

## 2012-03-27 MED ORDER — CYCLOBENZAPRINE HCL 5 MG PO TABS: 5.0000 mg | ORAL_TABLET | Freq: Three times a day (TID) | ORAL | Status: DC | PRN

## 2012-03-27 MED ORDER — TRAMADOL HCL 50 MG PO TABS: 50.0000 mg | ORAL_TABLET | Freq: Four times a day (QID) | ORAL | Status: DC | PRN

## 2012-03-27 MED ORDER — PREDNISONE 20 MG PO TABS: 20.0000 mg | ORAL_TABLET | Freq: Every day | ORAL | Status: DC

## 2012-03-27 NOTE — ED Notes (Signed)
Patient complains of middle back pain x1 month.  Was seen last week in the Urgent care facility.  Also complains Right ear itchy for 1 week.

## 2012-03-27 NOTE — ED Provider Notes (Signed)
History     CSN: [id number]  Arrival date & time 03/27/12  1532   None     Chief Complaint  Patient presents with  . Back Pain    middle of back    (Consider location/radiation/quality/duration/timing/severity/associated sxs/prior treatment) Patient is a 36 y.o. male presenting with back pain. The history is provided by the patient.  Back Pain  This is a recurrent problem. The current episode started more than 1 week ago. The problem occurs constantly. The problem has been gradually improving. The pain is associated with twisting. The pain is present in the thoracic spine. The quality of the pain is described as aching, shooting and stabbing. The pain is at a severity of 7/10. The pain is moderate. The symptoms are aggravated by bending and twisting. The pain is worse during the day. Stiffness is present in the morning. He has tried ice and NSAIDs for the symptoms. The treatment provided mild relief.    History reviewed. No pertinent past medical history.  History reviewed. No pertinent past surgical history.  No family history on file.  History  Substance Use Topics  . Smoking status: Current Every Day Smoker  . Smokeless tobacco: Not on file  . Alcohol Use: Yes      Review of Systems  Musculoskeletal: Positive for back pain.  All other systems reviewed and negative  Allergies  Mushroom extract complex  Home Medications   Current Outpatient Rx  Name  Route  Sig  Dispense  Refill  . CYCLOBENZAPRINE HCL 10 MG PO TABS   Oral   Take 1 tablet (10 mg total) by mouth 2 (two) times daily as needed for muscle spasms.   20 tablet   0   . HYDROCODONE-ACETAMINOPHEN 5-325 MG PO TABS   Oral   Take 1-2 tablets by mouth every 6 (six) hours as needed for pain.   20 tablet   0   . HYDROCODONE-ACETAMINOPHEN 5-500 MG PO TABS   Oral   Take 1-2 tablets by mouth every 6 (six) hours as needed for pain.   20 tablet   0   . IBUPROFEN 600 MG PO TABS   Oral   Take 1 tablet  (600 mg total) by mouth every 6 (six) hours as needed for pain. Take with food.   20 tablet   0   . ADULT MULTIVITAMIN W/MINERALS CH   Oral   Take 2 tablets by mouth at bedtime.          Marland Kitchen NAPROXEN 500 MG PO TBEC   Oral   Take 1 tablet (500 mg total) by mouth 2 (two) times daily with a meal.   20 tablet   0   . NAPROXEN 500 MG PO TABS   Oral   Take 1 tablet (500 mg total) by mouth 2 (two) times daily.   14 tablet   0   . NYQUIL PO   Oral   Take 2 capsules by mouth at bedtime as needed. To help sleep.         Marland Kitchen TRAMADOL HCL 50 MG PO TABS   Oral   Take 1 tablet (50 mg total) by mouth every 6 (six) hours as needed for pain.   25 tablet   0     BP 110/89  Pulse 90  Temp 98.6 F (37 C) (Oral)  Resp 20  SpO2 99%  Physical Exam  Constitutional: He is oriented to person, place, and time. He appears well-developed and well-nourished. No  distress.  HENT:  Head: Normocephalic and atraumatic.  Eyes: EOM are normal. Pupils are equal, round, and reactive to light. Right eye exhibits no discharge. Left eye exhibits no discharge.  Neck: Normal range of motion. Neck supple. No thyromegaly present.  Cardiovascular: Normal rate, regular rhythm and normal heart sounds.   No murmur heard. Pulmonary/Chest: Effort normal and breath sounds normal. No respiratory distress.  Abdominal: Soft. Bowel sounds are normal. He exhibits no distension and no mass. There is no tenderness. There is no rebound and no guarding.  Musculoskeletal: He exhibits tenderness.       Tenderness with palpation of spinous process of T8 area with tight paraspinal muscles, negative straight leg raise  Neurological: He is alert and oriented to person, place, and time. No cranial nerve deficit. Coordination normal.  Skin: Skin is warm and dry. No rash noted. No erythema. No pallor.  Psychiatric: He has a normal mood and affect. His behavior is normal. Judgment and thought content normal.    ED Course    Procedures (including critical care time)  Labs Reviewed - No data to display No results found.   No diagnosis found.  MDM  IMPRESSION  Thoracic Back Pain, like disc injury  RECOMMENDATIONS / PLAN  Reviewed xray results with patient, he likely will need an MRI if no improvement in next week,   Trial of prednisone taper over 2 weeks, refilled flexeril 5 mg po tid prn spasm, tramadol 50 mg prn pain Avoid heavy lifting and prolonged sitting  FOLLOW UP 2 weeks  The patient was given clear instructions to go to ER or return to medical center if symptoms don't improve, worsen or new problems develop.  The patient verbalized understanding.  The patient was told to call to get lab results if they haven't heard anything in the next week.           Cleora Fleet, MD 03/27/12 1944

## 2012-04-04 ENCOUNTER — Encounter (HOSPITAL_COMMUNITY): Payer: Self-pay

## 2012-04-04 ENCOUNTER — Emergency Department (HOSPITAL_COMMUNITY)
Admission: EM | Admit: 2012-04-04 | Discharge: 2012-04-04 | Disposition: A | Discharge status: Home / Self Care | Payer: Self-pay | Source: Home / Self Care

## 2012-04-04 DIAGNOSIS — M549 Dorsalgia, unspecified: Secondary | ICD-10-CM

## 2012-04-04 MED ORDER — TRAMADOL HCL 50 MG PO TABS: 50.0000 mg | ORAL_TABLET | Freq: Four times a day (QID) | ORAL | Status: DC | PRN

## 2012-04-04 MED ORDER — LIDOCAINE 5 % EX PTCH: 1.0000 | MEDICATED_PATCH | CUTANEOUS | Status: DC

## 2012-04-04 MED ORDER — METHYLPREDNISOLONE SODIUM SUCC 125 MG IJ SOLR
INTRAMUSCULAR | Status: AC
  Filled 2012-04-04: qty 2

## 2012-04-04 MED ORDER — CYCLOBENZAPRINE HCL 5 MG PO TABS: 5.0000 mg | ORAL_TABLET | Freq: Three times a day (TID) | ORAL | Status: DC | PRN

## 2012-04-04 MED ORDER — METHYLPREDNISOLONE SODIUM SUCC 125 MG IJ SOLR
60.0000 mg | Freq: Once | INTRAMUSCULAR | Status: AC
  Administered 2012-04-04: 60 mg via INTRAMUSCULAR

## 2012-04-04 NOTE — ED Provider Notes (Signed)
Patient Demographics  Patrick [name], is a 36 y.o. male  ZOX:[id number]  WJX:[id number]  DOB - Jun 22, 1975  Chief Complaint  Patient presents with  . Back Pain        Subjective:   Patrick [name] is a 36 year old with history of recurrent upper back pain today last seen here in the clinic on 11/27 for same and states that while he was taking the prednisone 60 mg his back pain improved and was not requiring any additional pain medications, states that few days after dropping down to 40 mg daily on his prednisone his taking the Ultram 4 times a day now but still with increased pain. He states as part of his job is still having to do some lifting of heavy objects-10-15pounds. He denies any weakness and no paresthesias reported. Also today No headache, No chest pain, No abdominal pain - No Nausea, No new weakness tingling or numbness, No Cough - SOB.   Objective:    Filed Vitals:   04/04/12 1745  BP: 115/89  Pulse: 85  Temp: 97.7 F (36.5 C)  TempSrc: Oral  Resp: 20  SpO2: 100%     Exam  Awake Alert, Oriented X 3, nonfocal, Normal affect [place].AT,PERRAL Supple Neck,No JVD, No cervical lymphadenopathy appriciated.  Symmetrical Chest wall movement, Good air movement bilaterally, CTAB Back: Upper back/thoracic spine tenderness and paraspinal muscle tightness RRR,No Gallops,Rubs or new Murmurs, No Parasternal Heave +ve B.Sounds, Abd Soft, Non tender, No organomegaly appriciated, No rebound - guarding or rigidity. No Cyanosis, Clubbing or edema, No rash    Data Review   CBC No results found for this basename: WBC:5,HGB:5,HCT:5,PLT:5,MCV:5,MCH:5,MCHC:5,RDW:5,NEUTRABS:5,LYMPHSABS:5,MONOABS:5,EOSABS:5,BASOSABS:5,BANDABS:5,BANDSABD:5 in the last 168 hours  Chemistries   No results found for this basename: NA:5,K:5,CL:5,CO2:5,GLUCOSE:5,BUN:5,CREATININE:5,GFRCGP,:5,CALCIUM:5,MG:5,AST:5,ALT:5,ALKPHOS:5,BILITOT:5 in the last 168  hours ------------------------------------------------------------------------------------------------------------------ No results found for this basename: HGBA1C:2 in the last 72 hours ------------------------------------------------------------------------------------------------------------------ No results found for this basename: CHOL:2,HDL:2,LDLCALC:2,TRIG:2,CHOLHDL:2,LDLDIRECT:2 in the last 72 hours ------------------------------------------------------------------------------------------------------------------ No results found for this basename: TSH,T4TOTAL,FREET3,T3FREE,THYROIDAB in the last 72 hours ------------------------------------------------------------------------------------------------------------------ No results found for this basename: VITAMINB12:2,FOLATE:2,FERRITIN:2,TIBC:2,IRON:2,RETICCTPCT:2 in the last 72 hours  Coagulation profile  No results found for this basename: INR:5,PROTIME:5 in the last 168 hours     Prior to Admission medications   Medication Sig Start Date End Date Taking? Authorizing Provider  cyclobenzaprine (FLEXERIL) 5 MG tablet Take 1 tablet (5 mg total) by mouth 3 (three) times daily as needed for muscle spasms. 04/04/12   Kela Millin, MD  lidocaine (LIDODERM) 5 % Place 1 patch onto the skin daily. Remove & Discard patch within 12 hours or as directed by MD 04/04/12   Kela Millin, MD  Multiple Vitamin (MULTIVITAMIN WITH MINERALS) TABS Take 2 tablets by mouth at bedtime.     Historical Provider, MD  predniSONE (DELTASONE) 20 MG tablet Take 1 tablet (20 mg total) by mouth daily. Take 3 tabs po once daily for 3 days, then take 2 tabs po once daily for 7 days, then take 1 tab po daily for 7 days, then stop 03/27/12   Clanford L Laural Benes, MD  Pseudoeph-Doxylamine-DM-APAP (NYQUIL PO) Take 2 capsules by mouth at bedtime as needed. To help sleep.    Historical Provider, MD  traMADol (ULTRAM) 50 MG tablet Take 1-2 tablets (50-100 mg total) by mouth  every 6 (six) hours as needed for pain. 04/04/12   Kela Millin, MD     Assessment & Plan   Thoracic/upper back pain, recurrent -Discussed above, strongly recommended that he does no lifting at  work -Patient given a dose of IM of Solu-Medrol 60 mgx1 -Changed his Ultram to one to tablets every 6 when necessary and added Lidoderm patches -History follow back up at this clinic as previously scheduled or as needed. Follow-up Information    Please follow up. (As previously scheduled  at [facility])           Kela Millin M.D on 04/04/2012 at 6:38 PM   Kela Millin, MD 04/04/12 4540

## 2012-04-04 NOTE — ED Notes (Signed)
Patient states still having back pain. When he was on higher dose of prednisone he was feeling better but as he was decreasing the  Dose pain returned.

## 2012-04-10 ENCOUNTER — Emergency Department (HOSPITAL_COMMUNITY)
Admission: EM | Admit: 2012-04-10 | Discharge: 2012-04-10 | Disposition: A | Discharge status: Home / Self Care | Payer: Self-pay | Source: Home / Self Care

## 2012-04-10 ENCOUNTER — Encounter (HOSPITAL_COMMUNITY): Payer: Self-pay

## 2012-04-10 DIAGNOSIS — M546 Pain in thoracic spine: Secondary | ICD-10-CM

## 2012-04-10 DIAGNOSIS — J329 Chronic sinusitis, unspecified: Secondary | ICD-10-CM

## 2012-04-10 MED ORDER — CYCLOBENZAPRINE HCL 5 MG PO TABS: 5.0000 mg | ORAL_TABLET | Freq: Three times a day (TID) | ORAL | Status: DC | PRN

## 2012-04-10 MED ORDER — TRAMADOL HCL 50 MG PO TABS: ORAL_TABLET | ORAL | Status: DC

## 2012-04-10 NOTE — ED Notes (Signed)
Follow up -has been having back pain for quite a while
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
## 2012-04-17 NOTE — ED Notes (Signed)
Patient called today .  Coming in Friday for appt but needs refill on Naproxen.  Prescription for Naproxen 500mg  1 po every 12 hours #20 with NO refills called into [facility] @[phone] spoke with Yaw in pharmacy

## 2012-04-19 ENCOUNTER — Encounter (HOSPITAL_COMMUNITY): Payer: Self-pay

## 2012-04-19 ENCOUNTER — Emergency Department (HOSPITAL_COMMUNITY)
Admission: EM | Admit: 2012-04-19 | Discharge: 2012-04-19 | Disposition: A | Discharge status: Home / Self Care | Payer: Self-pay | Source: Home / Self Care

## 2012-04-19 DIAGNOSIS — M549 Dorsalgia, unspecified: Secondary | ICD-10-CM

## 2012-04-19 MED ORDER — HYDROCODONE-ACETAMINOPHEN 7.5-500 MG PO TABS: 1.0000 | ORAL_TABLET | Freq: Four times a day (QID) | ORAL | Status: DC | PRN

## 2012-04-19 MED ORDER — PREDNISONE 20 MG PO TABS: 60.0000 mg | ORAL_TABLET | Freq: Every day | ORAL | Status: DC

## 2012-04-19 NOTE — ED Provider Notes (Signed)
History     CSN: [id number]  Arrival date & time 04/19/12  1559   None     Chief Complaint  Patient presents with  . Follow-up    (Consider location/radiation/quality/duration/timing/severity/associated sxs/prior treatment) HPI Pt still having severe pain, he is up crying at nights with pain, he is taking meds as prescribed, only got minimal relief with high dose steroids.  The patient reports that he would like to go ahead and pursue an MRI study to try and figure out the extent of the problem and get to a qualified surgeon if necessary.  History reviewed. No pertinent past medical history.  History reviewed. No pertinent past surgical history.  No family history on file.  History  Substance Use Topics  . Smoking status: Current Every Day Smoker  . Smokeless tobacco: Not on file  . Alcohol Use: Yes    Review of Systems  Musculoskeletal: Positive for back pain.  All other systems reviewed and are negative.    Allergies  Mushroom extract complex  Home Medications   Current Outpatient Rx  Name  Route  Sig  Dispense  Refill  . CYCLOBENZAPRINE HCL 5 MG PO TABS   Oral   Take 1 tablet (5 mg total) by mouth 3 (three) times daily as needed for muscle spasms.   60 tablet   0   . HYDROCODONE-ACETAMINOPHEN 7.5-500 MG PO TABS   Oral   Take 1 tablet by mouth every 6 (six) hours as needed for pain.   30 tablet   0   . ADULT MULTIVITAMIN W/MINERALS CH   Oral   Take 2 tablets by mouth at bedtime.          Marland Kitchen PREDNISONE 20 MG PO TABS   Oral   Take 3 tablets (60 mg total) by mouth daily.   30 tablet   0     BP 117/90  Pulse 92  Temp 97.5 F (36.4 C) (Oral)  Resp 19  SpO2 98%  Physical Exam  Nursing note and vitals reviewed. Constitutional: He is oriented to person, place, and time. He appears well-developed and well-nourished. No distress.  HENT:  Head: Normocephalic and atraumatic.  Eyes: EOM are normal. Pupils are equal, round, and reactive to light.   Neck: Normal range of motion. Neck supple.  Cardiovascular: Normal rate, regular rhythm and normal heart sounds.   Pulmonary/Chest: Effort normal and breath sounds normal.  Abdominal: Soft. Bowel sounds are normal.  Musculoskeletal:       Severe tenderness along the T. spine and severe spasm of the paraspinal muscles in the thoracic area  Neurological: He is alert and oriented to person, place, and time.  Skin: Skin is warm and dry. No erythema.  Psychiatric: He has a normal mood and affect. His behavior is normal. Judgment and thought content normal.    ED Course  Procedures (including critical care time)  Labs Reviewed - No data to display No results found.   1. Back pain     MDM  IMPRESSION  Persistent chronic back pain of T spine   RECOMMENDATIONS / PLAN  Ordered MRI T spine w wo contrast  Lortab 7.5 milligrams by mouth every 6 hours when necessary severe pain Prednisone 60 mg po daily for 10 days  Continue cyclobenzaprine 5 mg 3 times a day  FOLLOW UP After the MRI study  The patient was given clear instructions to go to ER or return to medical center if symptoms don't improve, worsen or new  problems develop.  The patient verbalized understanding.  The patient was told to call to get lab results if they haven't heard anything in the next week.           Cleora Fleet, MD 04/19/12 2159

## 2012-04-19 NOTE — ED Notes (Signed)
Follow up back pain

## 2012-04-23 ENCOUNTER — Other Ambulatory Visit (HOSPITAL_COMMUNITY): Payer: Self-pay | Admitting: Family Medicine

## 2012-04-23 ENCOUNTER — Ambulatory Visit (HOSPITAL_COMMUNITY)
Admission: RE | Admit: 2012-04-23 | Discharge: 2012-04-23 | Disposition: A | Discharge status: Home / Self Care | Payer: Self-pay | Source: Ambulatory Visit | Attending: Family Medicine | Admitting: Family Medicine

## 2012-04-23 DIAGNOSIS — R52 Pain, unspecified: Secondary | ICD-10-CM

## 2012-04-23 DIAGNOSIS — M549 Dorsalgia, unspecified: Secondary | ICD-10-CM | POA: Insufficient documentation

## 2012-04-25 ENCOUNTER — Telehealth (HOSPITAL_COMMUNITY): Payer: Self-pay

## 2012-04-25 NOTE — Telephone Encounter (Signed)
Message copied by Lestine Mount on Thu Apr 25, 2012  1:32 PM ------      Message from: Cleora Fleet      Created: Thu Apr 25, 2012 11:22 AM      Regarding: MRI results       Please notify patient that his MRI study is normal.  If he is still having pain, please go ahead and make an appointment with him to see the orthopedics back doctor for further evaluation.              Rodney Langton, MD, CDE, FAAFP      [facility]      [facility]      [place], [place]

## 2012-04-25 NOTE — Telephone Encounter (Signed)
Message copied by Lestine Mount on Thu Apr 25, 2012  1:26 PM ------      Message from: Cleora Fleet      Created: Thu Apr 25, 2012 11:22 AM      Regarding: MRI results       Please notify patient that his MRI study is normal.  If he is still having pain, please go ahead and make an appointment with him to see the orthopedics back doctor for further evaluation.              Rodney Langton, MD, CDE, FAAFP      [facility]      [facility]      [place], [place]

## 2012-04-29 ENCOUNTER — Telehealth (HOSPITAL_COMMUNITY): Payer: Self-pay

## 2013-03-23 ENCOUNTER — Encounter (HOSPITAL_COMMUNITY): Payer: Self-pay | Admitting: Emergency Medicine

## 2013-03-23 ENCOUNTER — Emergency Department (HOSPITAL_COMMUNITY)
Admission: EM | Admit: 2013-03-23 | Discharge: 2013-03-23 | Disposition: A | Discharge status: Home / Self Care | Payer: Self-pay | Source: Home / Self Care

## 2013-03-23 ENCOUNTER — Emergency Department (INDEPENDENT_AMBULATORY_CARE_PROVIDER_SITE_OTHER)
Admission: EM | Admit: 2013-03-23 | Discharge: 2013-03-23 | Disposition: A | Discharge status: Home / Self Care | Payer: Self-pay | Source: Home / Self Care | Attending: Emergency Medicine | Admitting: Emergency Medicine

## 2013-03-23 DIAGNOSIS — K0401 Reversible pulpitis: Secondary | ICD-10-CM

## 2013-03-23 MED ORDER — AMOXICILLIN 500 MG PO CAPS: 1000.0000 mg | ORAL_CAPSULE | Freq: Three times a day (TID) | ORAL | Status: DC

## 2013-03-23 MED ORDER — MELOXICAM 15 MG PO TABS: 15.0000 mg | ORAL_TABLET | Freq: Every day | ORAL | Status: DC

## 2013-03-23 MED ORDER — HYDROCODONE-ACETAMINOPHEN 5-325 MG PO TABS
2.0000 | ORAL_TABLET | Freq: Once | ORAL | Status: AC
  Administered 2013-03-23: 2 via ORAL

## 2013-03-23 MED ORDER — OXYCODONE-ACETAMINOPHEN 5-325 MG PO TABS: ORAL_TABLET | ORAL | Status: DC

## 2013-03-23 MED ORDER — HYDROCODONE-ACETAMINOPHEN 5-325 MG PO TABS
ORAL_TABLET | ORAL | Status: AC
  Filled 2013-03-23: qty 2

## 2013-03-23 MED ORDER — METRONIDAZOLE 500 MG PO TABS: 500.0000 mg | ORAL_TABLET | Freq: Three times a day (TID) | ORAL | Status: DC

## 2013-03-23 NOTE — ED Provider Notes (Signed)
Chief Complaint:   Chief Complaint  Patient presents with  . Dental Pain    History of Present Illness:   Patrick [name] is a 37 year old male who has had a three-day history of toothache in the right, lower, second molar. This tooth broke about a year ago and a filling came out about 2 months ago. He has swelling of the gingiva and the cheek, pain with swallowing, and sore throat. The pain radiates towards his ear and these had some headache. No fever or chills, no difficulty breathing, no swelling of the neck or adenopathy. It hurts to chew and open the mouth. He is able to fully open his mouth.  Review of Systems:  Other than noted above, the patient denies any of the following symptoms: Systemic:  No fever, chills,  Or sweats. ENT:  No headache, ear ache, sore throat, nasal congestion, facial pain, or swelling. Lymphatic:  No adenopathy. Lungs:  No coughing, wheezing or shortness of breath.  PMFSH:  Past medical history, family history, social history, meds, and allergies were reviewed.  Physical Exam:   Vital signs:  BP 138/91  Pulse 67  Temp(Src) 99.1 F (37.3 C) (Oral)  Resp 18  SpO2 99% General:  Alert, oriented, in no distress. ENT:  TMs and canals normal.  Nasal mucosa normal. Mouth exam:  He has several very carious teeth. The tooth in question was the right, lower, second molar. It's severely decayed. There is no purulent drainage, no collection of pus, no gingival or cheek swelling. It's tender to touch. There is no swelling of the tongue floor the mouth. The pharynx is clear and widely patent. Neck:  No swelling or adenopathy. Lungs:  Breath sounds clear and equal bilaterally.  No wheezes, rales or rhonchi. Heart:  Regular rhythm.  No gallops or murmers. Skin:  Clear, warm and dry.   Course in Urgent Care Center:   Given Norco 5/325, 2 by mouth for pain.  Assessment:  The encounter diagnosis was Pulpitis.  Plan:   1.  Meds:  The following meds were prescribed:    Discharge Medication List as of 03/23/2013  1:34 PM    START taking these medications   Details  amoxicillin (AMOXIL) 500 MG capsule Take 2 capsules (1,000 mg total) by mouth 3 (three) times daily., Starting 03/23/2013, Until Discontinued, Normal    meloxicam (MOBIC) 15 MG tablet Take 1 tablet (15 mg total) by mouth daily., Starting 03/23/2013, Until Discontinued, Normal    metroNIDAZOLE (FLAGYL) 500 MG tablet Take 1 tablet (500 mg total) by mouth 3 (three) times daily., Starting 03/23/2013, Until Discontinued, Normal    oxyCODONE-acetaminophen (PERCOCET) 5-325 MG per tablet 1 to 2 tablets every 6 hours as needed for pain., Print        2.  Patient Education/Counseling:  The patient was given appropriate handouts, self care instructions, and instructed in symptomatic relief. Suggested sleeping with head of bed elevated and hot salt water mouthwash.   3.  Follow up:  The patient was told to follow up if no better in 3 to 4 days, if becoming worse in any way, and given some red flag symptoms such as difficulty swallowing or breathing which would prompt immediate return.  Follow up with a dentist as soon as posssible.     Reuben Likes, MD 03/23/13 [phone]

## 2013-03-23 NOTE — ED Notes (Signed)
Assessment per Dr. Keller. 

## 2013-07-07 ENCOUNTER — Emergency Department (INDEPENDENT_AMBULATORY_CARE_PROVIDER_SITE_OTHER)
Admission: EM | Admit: 2013-07-07 | Discharge: 2013-07-07 | Disposition: A | Discharge status: Home / Self Care | Payer: Self-pay | Source: Home / Self Care | Attending: Emergency Medicine | Admitting: Emergency Medicine

## 2013-07-07 ENCOUNTER — Encounter (HOSPITAL_COMMUNITY): Payer: Self-pay | Admitting: Emergency Medicine

## 2013-07-07 DIAGNOSIS — L309 Dermatitis, unspecified: Secondary | ICD-10-CM

## 2013-07-07 DIAGNOSIS — H60399 Other infective otitis externa, unspecified ear: Secondary | ICD-10-CM

## 2013-07-07 DIAGNOSIS — H6090 Unspecified otitis externa, unspecified ear: Secondary | ICD-10-CM

## 2013-07-07 DIAGNOSIS — L259 Unspecified contact dermatitis, unspecified cause: Secondary | ICD-10-CM

## 2013-07-07 DIAGNOSIS — J329 Chronic sinusitis, unspecified: Secondary | ICD-10-CM

## 2013-07-07 DIAGNOSIS — S025XXA Fracture of tooth (traumatic), initial encounter for closed fracture: Secondary | ICD-10-CM

## 2013-07-07 HISTORY — DX: Dermatitis, unspecified: L30.9

## 2013-07-07 MED ORDER — PREDNISONE 10 MG PO TABS: ORAL_TABLET | ORAL | Status: DC

## 2013-07-07 MED ORDER — TRAMADOL HCL 50 MG PO TABS: 50.0000 mg | ORAL_TABLET | Freq: Four times a day (QID) | ORAL | Status: DC | PRN

## 2013-07-07 MED ORDER — AMOXICILLIN 875 MG PO TABS: 875.0000 mg | ORAL_TABLET | Freq: Two times a day (BID) | ORAL | Status: DC

## 2013-07-07 MED ORDER — TRIAMCINOLONE ACETONIDE 0.1 % EX CREA: 1.0000 "application " | TOPICAL_CREAM | Freq: Two times a day (BID) | CUTANEOUS | Status: DC

## 2013-07-07 MED ORDER — NEOMYCIN-COLIST-HC-THONZONIUM 3.3-3-10-0.5 MG/ML OT SUSP
4.0000 [drp] | Freq: Four times a day (QID) | OTIC | Status: DC
  Administered 2013-07-07: 4 [drp] via OTIC

## 2013-07-07 NOTE — ED Notes (Signed)
Patient reports uri: sinus drainage and nasal stuffiness, itchy ears-these symptoms for 1 1/2 -2 weeks.  As of Thursday 3/5 started with sore throat.  Friday ( 3/6) patient was tripped by dog and fell.  Patient reports tooth broke due to this event and now has pain in location of broken tooth-bottom, right.   Patient requests refill of triamcinolone cream.

## 2013-07-07 NOTE — ED Provider Notes (Signed)
CSN: [id number]     Arrival date & time 07/07/13  1014 History   First MD Initiated Contact with Patient 07/07/13 1142     Chief Complaint  Patient presents with  . URI  . Dental Pain   (Consider location/radiation/quality/duration/timing/severity/associated sxs/prior Treatment) HPI Comments:  38 year old male presents with multiple complaints. The first is that he believes he has a sinus infection. He has had nasal congestion, Postnasal drip, and runny nose for about 10 days, getting progressively worse. He has been taking Tylenol Sinus and using Nasacort but his symptoms persist. He is having a sore throat now as well. Overall, he feels very fatigued and drained. No fever, chills, NVD. Second, he needs a refill of his triamcinolone cream that he uses as needed for eczema.  Finally, he fell and broke his tooth 3 days ago. His dog was running and clotheslined him in the back of the legs with a chain. He fell to the ground, hit the back of his head, and fractured his ear lower right molar. He is having pain in this tooth from that. He does not believe he lost consciousness. No headache, nausea, vomiting, blurry vision. No extremity numbness or weakness. So, admits to itchy external ear canals for the past few weeks.    Past Medical History  Diagnosis Date  . Eczema    History reviewed. No pertinent past surgical history. No family history on file. History  Substance Use Topics  . Smoking status: Current Every Day Smoker  . Smokeless tobacco: Not on file  . Alcohol Use: Yes    Review of Systems  Constitutional: Positive for fatigue. Negative for fever and chills.  HENT: Positive for congestion, dental problem, postnasal drip, rhinorrhea, sinus pressure and sore throat. Negative for ear discharge, ear pain, nosebleeds and trouble swallowing.   Eyes: Negative for visual disturbance.  Respiratory: Positive for cough. Negative for chest tightness and shortness of breath.   Cardiovascular:  Negative for chest pain, palpitations and leg swelling.  Gastrointestinal: Negative for nausea, vomiting, abdominal pain, diarrhea and constipation.  Genitourinary: Negative for dysuria, urgency, frequency and hematuria.  Musculoskeletal: Negative for arthralgias, myalgias, neck pain and neck stiffness.  Skin: Negative for rash.  Neurological: Negative for dizziness, weakness and light-headedness.    Allergies  Mushroom extract complex  Home Medications   Current Outpatient Rx  Name  Route  Sig  Dispense  Refill  . Naproxen Sodium (ALEVE PO)   Oral   Take by mouth.         . Phenylephrine-Acetaminophen (TYLENOL SINUS CONGESTION/PAIN PO)   Oral   Take by mouth.         . Triamcinolone Acetonide (NASACORT AQ NA)   Nasal   Place into the nose.         Marland Kitchen amoxicillin (AMOXIL) 500 MG capsule   Oral   Take 2 capsules (1,000 mg total) by mouth 3 (three) times daily.   30 capsule   0     Dispense as written.   Marland Kitchen amoxicillin (AMOXIL) 875 MG tablet   Oral   Take 1 tablet (875 mg total) by mouth 2 (two) times daily.   14 tablet   0   . cyclobenzaprine (FLEXERIL) 5 MG tablet   Oral   Take 1 tablet (5 mg total) by mouth 3 (three) times daily as needed for muscle spasms.   60 tablet   0   . HYDROcodone-acetaminophen (LORTAB 7.5) 7.5-500 MG per tablet   Oral  Take 1 tablet by mouth every 6 (six) hours as needed for pain.   30 tablet   0   . meloxicam (MOBIC) 15 MG tablet   Oral   Take 1 tablet (15 mg total) by mouth daily.   15 tablet   0   . metroNIDAZOLE (FLAGYL) 500 MG tablet   Oral   Take 1 tablet (500 mg total) by mouth 3 (three) times daily.   30 tablet   0   . Multiple Vitamin (MULTIVITAMIN WITH MINERALS) TABS   Oral   Take 2 tablets by mouth at bedtime.          Marland Kitchen oxyCODONE-acetaminophen (PERCOCET) 5-325 MG per tablet      1 to 2 tablets every 6 hours as needed for pain.   20 tablet   0   . predniSONE (DELTASONE) 10 MG tablet      4 tabs  PO QD for 4 days; 3 tabs PO QD for 3 days; 2 tabs PO QD for 2 days; 1 tab PO QD for 1 day   30 tablet   0   . predniSONE (DELTASONE) 20 MG tablet   Oral   Take 3 tablets (60 mg total) by mouth daily.   30 tablet   0   . traMADol (ULTRAM) 50 MG tablet   Oral   Take 1 tablet (50 mg total) by mouth every 6 (six) hours as needed.   20 tablet   0   . triamcinolone cream (KENALOG) 0.1 %   Topical   Apply 1 application topically 2 (two) times daily.   85.2 g   1    BP 128/91  Pulse 77  Temp(Src) 98.5 F (36.9 C) (Oral)  Resp 16  SpO2 100% Physical Exam  Nursing note and vitals reviewed. Constitutional: He is oriented to person, place, and time. He appears well-developed and well-nourished. No distress.  HENT:  Head: Normocephalic and atraumatic.  Right Ear: Tympanic membrane normal. There is drainage.  Left Ear: Tympanic membrane normal. There is drainage.  Nose: Right sinus exhibits maxillary sinus tenderness. Right sinus exhibits no frontal sinus tenderness. Left sinus exhibits maxillary sinus tenderness. Left sinus exhibits no frontal sinus tenderness.  Mouth/Throat: Uvula is midline, oropharynx is clear and moist and mucous membranes are normal. Abnormal dentition (Fractured right posterior lower molar).  Neck: Normal range of motion. Neck supple.  Cardiovascular: Normal rate, regular rhythm and normal heart sounds.  Exam reveals no friction rub.   No murmur heard. Pulmonary/Chest: Effort normal and breath sounds normal. No respiratory distress. He has no wheezes. He has no rales.  Lymphadenopathy:    He has cervical adenopathy (submandibularand tonsillar, bilateral).  Neurological: He is alert and oriented to person, place, and time. Coordination normal.  Skin: Skin is warm and dry. No rash noted. He is not diaphoretic.  Psychiatric: He has a normal mood and affect. Judgment normal.    ED Course  Procedures (including critical care time) Labs Review Labs Reviewed -  No data to display Imaging Review No results found.   MDM   1. Sinusitis   2. Fractured tooth   3. Eczema   4. Otitis externa    He should continue to use the Nasacort and Tylenol Sinus. He has been given Cortisporin otic for the otitis externa. Treating sinusitis with amoxicillin and prednisone pack. Tramadol when necessary for pain in the tooth, and he needs to see a dentist as soon as possible. Triamcinolone cream refilled.  Meds  ordered this encounter  Medications  . triamcinolone cream (KENALOG) 0.1 %    Sig: Apply 1 application topically 2 (two) times daily.    Dispense:  85.2 g    Refill:  1    Order Specific Question:  Supervising Provider    Answer:  Lorenz CoasterKELLER, DAVID C [id number]]  . traMADol (ULTRAM) 50 MG tablet    Sig: Take 1 tablet (50 mg total) by mouth every 6 (six) hours as needed.    Dispense:  20 tablet    Refill:  0    Order Specific Question:  Supervising Provider    Answer:  Lorenz CoasterKELLER, DAVID C [id number]]  . amoxicillin (AMOXIL) 875 MG tablet    Sig: Take 1 tablet (875 mg total) by mouth 2 (two) times daily.    Dispense:  14 tablet    Refill:  0    Order Specific Question:  Supervising Provider    Answer:  Lorenz CoasterKELLER, DAVID C [id number]]  . predniSONE (DELTASONE) 10 MG tablet    Sig: 4 tabs PO QD for 4 days; 3 tabs PO QD for 3 days; 2 tabs PO QD for 2 days; 1 tab PO QD for 1 day    Dispense:  30 tablet    Refill:  0    Order Specific Question:  Supervising Provider    Answer:  Lorenz CoasterKELLER, DAVID C [id number]]  . neomycin-colistin-hydrocortisone-thonzonium (CORTISPORIN TC) otic suspension 4 drop    Sig:         Graylon GoodZachary H Jocie Meroney, PA-C 07/07/13 1210

## 2013-07-07 NOTE — ED Provider Notes (Signed)
Medical screening examination/treatment/procedure(s) were performed by non-physician practitioner and as supervising physician I was immediately available for consultation/collaboration.  Leslee Homeavid Jatavious Peppard, M.D.  Reuben Likesavid C Rosalba Totty, MD 07/07/13 [phone]

## 2013-07-07 NOTE — Discharge Instructions (Signed)
Take Advil sinus in addition to the prescriptions as needed for sinus pressure or pain. Advil allergy sinus will help more if you're having a lot of drainage and runny nose.   Dental Injury Your exam shows that you have injured your teeth. The treatment of broken teeth and other dental injuries depends on how badly they are hurt. All dental injuries should be checked as soon as possible by a dentist if there are:  Loose teeth which may need to be wired or bonded with a plastic device to hold them in place.  Broken teeth with exposed tooth pulp which may cause a serious infection.  Painful teeth especially when you bite or chew.  Sharp tooth edges that cut your tongue or lips. Sometimes, antibiotics or pain medicine are prescribed to prevent infection and control pain. Eat a soft or liquid diet and rinse your mouth out after meals with warm water. You should see a dentist or return here at once if you have increased swelling, increased pain or uncontrolled bleeding from the site of your injury. SEEK MEDICAL CARE IF:   You have increased pain not controlled with medicines.  You have swelling around your tooth, in your face or neck.  You have bleeding which starts, continues, or gets worse.  You have a fever. Document Released: 04/17/2005 Document Revised: 07/10/2011 Document Reviewed: 04/16/2009 [facility] Patient Information 2014 [place], [place].  Sinusitis Sinusitis is redness, soreness, and swelling (inflammation) of the paranasal sinuses. Paranasal sinuses are air pockets within the bones of your face (beneath the eyes, the middle of the forehead, or above the eyes). In healthy paranasal sinuses, mucus is able to drain out, and air is able to circulate through them by way of your nose. However, when your paranasal sinuses are inflamed, mucus and air can become trapped. This can allow bacteria and other germs to grow and cause infection. Sinusitis can develop quickly and last only a short  time (acute) or continue over a long period (chronic). Sinusitis that lasts for more than 12 weeks is considered chronic.  CAUSES  Causes of sinusitis include:  Allergies.  Structural abnormalities, such as displacement of the cartilage that separates your nostrils (deviated septum), which can decrease the air flow through your nose and sinuses and affect sinus drainage.  Functional abnormalities, such as when the small hairs (cilia) that line your sinuses and help remove mucus do not work properly or are not present. SYMPTOMS  Symptoms of acute and chronic sinusitis are the same. The primary symptoms are pain and pressure around the affected sinuses. Other symptoms include:  Upper toothache.  Earache.  Headache.  Bad breath.  Decreased sense of smell and taste.  A cough, which worsens when you are lying flat.  Fatigue.  Fever.  Thick drainage from your nose, which often is green and may contain pus (purulent).  Swelling and warmth over the affected sinuses. DIAGNOSIS  Your caregiver will perform a physical exam. During the exam, your caregiver may:  Look in your nose for signs of abnormal growths in your nostrils (nasal polyps).  Tap over the affected sinus to check for signs of infection.  View the inside of your sinuses (endoscopy) with a special imaging device with a light attached (endoscope), which is inserted into your sinuses. If your caregiver suspects that you have chronic sinusitis, one or more of the following tests may be recommended:  Allergy tests.  Nasal culture A sample of mucus is taken from your nose and sent to  a lab and screened for bacteria.  Nasal cytology A sample of mucus is taken from your nose and examined by your caregiver to determine if your sinusitis is related to an allergy. TREATMENT  Most cases of acute sinusitis are related to a viral infection and will resolve on their own within 10 days. Sometimes medicines are prescribed to help  relieve symptoms (pain medicine, decongestants, nasal steroid sprays, or saline sprays).  However, for sinusitis related to a bacterial infection, your caregiver will prescribe antibiotic medicines. These are medicines that will help kill the bacteria causing the infection.  Rarely, sinusitis is caused by a fungal infection. In theses cases, your caregiver will prescribe antifungal medicine. For some cases of chronic sinusitis, surgery is needed. Generally, these are cases in which sinusitis recurs more than 3 times per year, despite other treatments. HOME CARE INSTRUCTIONS   Drink plenty of water. Water helps thin the mucus so your sinuses can drain more easily.  Use a humidifier.  Inhale steam 3 to 4 times a day (for example, sit in the bathroom with the shower running).  Apply a warm, moist washcloth to your face 3 to 4 times a day, or as directed by your caregiver.  Use saline nasal sprays to help moisten and clean your sinuses.  Take over-the-counter or prescription medicines for pain, discomfort, or fever only as directed by your caregiver. SEEK IMMEDIATE MEDICAL CARE IF:  You have increasing pain or severe headaches.  You have nausea, vomiting, or drowsiness.  You have swelling around your face.  You have vision problems.  You have a stiff neck.  You have difficulty breathing. MAKE SURE YOU:   Understand these instructions.  Will watch your condition.  Will get help right away if you are not doing well or get worse. Document Released: 04/17/2005 Document Revised: 07/10/2011 Document Reviewed: 05/02/2011 [facility] Patient Information 2014 [place], [place].

## 2013-11-18 ENCOUNTER — Emergency Department (INDEPENDENT_AMBULATORY_CARE_PROVIDER_SITE_OTHER)
Admission: EM | Admit: 2013-11-18 | Discharge: 2013-11-18 | Disposition: A | Discharge status: Home / Self Care | Payer: Self-pay | Source: Home / Self Care | Attending: Family Medicine | Admitting: Family Medicine

## 2013-11-18 ENCOUNTER — Encounter (HOSPITAL_COMMUNITY): Payer: Self-pay | Admitting: Emergency Medicine

## 2013-11-18 DIAGNOSIS — S51809A Unspecified open wound of unspecified forearm, initial encounter: Secondary | ICD-10-CM

## 2013-11-18 DIAGNOSIS — X58XXXA Exposure to other specified factors, initial encounter: Secondary | ICD-10-CM

## 2013-11-18 DIAGNOSIS — S51812A Laceration without foreign body of left forearm, initial encounter: Secondary | ICD-10-CM

## 2013-11-18 NOTE — ED Provider Notes (Signed)
Medical screening examination/treatment/procedure(s) were performed by resident physician or non-physician practitioner and as supervising physician I was immediately available for consultation/collaboration.   Javohn Basey DOUGLAS MD.   Autry Prust D Michaela Broski, MD 11/18/13 1710 

## 2013-11-18 NOTE — Discharge Instructions (Signed)

## 2013-11-18 NOTE — ED Notes (Addendum)
Patient c/o cut on left forearm onset today. Patient reports he cut it on some tile. Had a tetanus shot last year. Patient request that he could have "butterfly strips, because last time I had stitches I got an infection" Patient is alert and oriented and in no acute distress.

## 2013-11-18 NOTE — ED Provider Notes (Signed)
CSN: [id number]     Arrival date & time 11/18/13  1215 History   First MD Initiated Contact with Patient 11/18/13 1249     Chief Complaint  Patient presents with  . Extremity Laceration   (Consider location/radiation/quality/duration/timing/severity/associated sxs/prior Treatment) HPI Comments: 38 year old male presents complaining of a laceration to his left arm over top of the triceps. This occurred earlier today. Bleeding has been controlled with direct pressure. He is requesting to have this rinsed out and have Steri-Strips placed. He declines even the possibility of sutures, stating that he has had an allergic reaction to sutures in the past and doesn't want to risk it again. No numbness distal to the injury. No systemic symptoms. No other injury   Past Medical History  Diagnosis Date  . Eczema    History reviewed. No pertinent past surgical history. No family history on file. History  Substance Use Topics  . Smoking status: Current Every Day Smoker  . Smokeless tobacco: Not on file  . Alcohol Use: Yes    Review of Systems  Skin: Positive for wound.  All other systems reviewed and are negative.   Allergies  Mushroom extract complex  Home Medications   Prior to Admission medications   Medication Sig Start Date End Date Taking? Authorizing Provider  amoxicillin (AMOXIL) 500 MG capsule Take 2 capsules (1,000 mg total) by mouth 3 (three) times daily. 03/23/13   Reuben Likes, MD  amoxicillin (AMOXIL) 875 MG tablet Take 1 tablet (875 mg total) by mouth 2 (two) times daily. 07/07/13   Adrian Blackwater Kimble Delaurentis, PA-C  cyclobenzaprine (FLEXERIL) 5 MG tablet Take 1 tablet (5 mg total) by mouth 3 (three) times daily as needed for muscle spasms. 04/10/12   Nishant Dhungel, MD  HYDROcodone-acetaminophen (LORTAB 7.5) 7.5-500 MG per tablet Take 1 tablet by mouth every 6 (six) hours as needed for pain. 04/19/12   Clanford Cyndie Mull, MD  meloxicam (MOBIC) 15 MG tablet Take 1 tablet (15 mg total) by  mouth daily. 03/23/13   Reuben Likes, MD  metroNIDAZOLE (FLAGYL) 500 MG tablet Take 1 tablet (500 mg total) by mouth 3 (three) times daily. 03/23/13   Reuben Likes, MD  Multiple Vitamin (MULTIVITAMIN WITH MINERALS) TABS Take 2 tablets by mouth at bedtime.     Historical Provider, MD  Naproxen Sodium (ALEVE PO) Take by mouth.    Historical Provider, MD  oxyCODONE-acetaminophen (PERCOCET) 5-325 MG per tablet 1 to 2 tablets every 6 hours as needed for pain. 03/23/13   Reuben Likes, MD  Phenylephrine-Acetaminophen (TYLENOL SINUS CONGESTION/PAIN PO) Take by mouth.    Historical Provider, MD  predniSONE (DELTASONE) 10 MG tablet 4 tabs PO QD for 4 days; 3 tabs PO QD for 3 days; 2 tabs PO QD for 2 days; 1 tab PO QD for 1 day 07/07/13   Graylon Good, PA-C  predniSONE (DELTASONE) 20 MG tablet Take 3 tablets (60 mg total) by mouth daily. 04/19/12   Clanford Cyndie Mull, MD  traMADol (ULTRAM) 50 MG tablet Take 1 tablet (50 mg total) by mouth every 6 (six) hours as needed. 07/07/13   Adrian Blackwater Ellanora Rayborn, PA-C  Triamcinolone Acetonide (NASACORT AQ NA) Place into the nose.    Historical Provider, MD  triamcinolone cream (KENALOG) 0.1 % Apply 1 application topically 2 (two) times daily. 07/07/13   Adrian Blackwater Vianny Schraeder, PA-C   BP 125/82  Pulse 95  Temp(Src) 98.7 F (37.1 C) (Oral)  Resp 16  SpO2 98% Physical Exam  Nursing note and vitals reviewed. Constitutional: He is oriented to person, place, and time. He appears well-developed and well-nourished. No distress.  HENT:  Head: Normocephalic.  Cardiovascular:  Pulses:      Radial pulses are 2+ on the right side, and 2+ on the left side.  Pulmonary/Chest: Effort normal. No respiratory distress.  Musculoskeletal:       Left forearm: He exhibits tenderness and laceration.       Arms: Neurological: He is alert and oriented to person, place, and time. Coordination normal.  Skin: Skin is warm and dry. No rash noted. He is not diaphoretic.  Psychiatric: He has a  normal mood and affect. Judgment normal.    ED Course  LACERATION REPAIR Date/Time: 11/18/2013 1:30 PM Performed by: Autumn MessingBAKER, Aalliyah Kilker, H Authorized by: Bradd CanaryKINDL, JAMES D Consent: Verbal consent obtained. Risks and benefits: risks, benefits and alternatives were discussed Consent given by: patient Patient understanding: patient states understanding of the procedure being performed Patient identity confirmed: verbally with patient Time out: Immediately prior to procedure a "time out" was called to verify the correct patient, procedure, equipment, support staff and site/side marked as required. Body area: upper extremity Location details: left lower arm Laceration length: 3 cm Contamination: The wound is contaminated. Foreign body present: ceramic shards. Tendon involvement: none Nerve involvement: none Vascular damage: yes Anesthesia method: pt declines. Patient sedated: no Irrigation solution: saline and tap water Irrigation method: syringe Amount of cleaning: extensive Debridement: minimal Skin closure: Steri-Strips Approximation difficulty: simple Patient tolerance: Patient tolerated the procedure well with no immediate complications.   (including critical care time) Labs Review Labs Reviewed - No data to display  Imaging Review No results found.   MDM   1. Laceration of forearm, left, initial encounter    Encouraged patient to consider sutures, but he still declines. The wound was debrided of all visible pieces of ceramic, rinsed again, and then the skin was closed with Steri-Strips. He will watch for signs of infection. Keep clean, dry, covered. Followup as needed. Ibuprofen for pain      Graylon GoodZachary H Lashia Niese, PA-C 11/18/13 1335

## 2013-11-26 ENCOUNTER — Emergency Department (HOSPITAL_COMMUNITY)
Admission: EM | Admit: 2013-11-26 | Discharge: 2013-11-26 | Disposition: A | Discharge status: Home / Self Care | Payer: Self-pay | Attending: Emergency Medicine | Admitting: Emergency Medicine

## 2013-11-26 ENCOUNTER — Encounter (HOSPITAL_COMMUNITY): Payer: Self-pay | Admitting: Emergency Medicine

## 2013-11-26 DIAGNOSIS — Z872 Personal history of diseases of the skin and subcutaneous tissue: Secondary | ICD-10-CM | POA: Insufficient documentation

## 2013-11-26 DIAGNOSIS — Z79899 Other long term (current) drug therapy: Secondary | ICD-10-CM | POA: Insufficient documentation

## 2013-11-26 DIAGNOSIS — F17200 Nicotine dependence, unspecified, uncomplicated: Secondary | ICD-10-CM | POA: Insufficient documentation

## 2013-11-26 DIAGNOSIS — K029 Dental caries, unspecified: Secondary | ICD-10-CM | POA: Insufficient documentation

## 2013-11-26 DIAGNOSIS — K0889 Other specified disorders of teeth and supporting structures: Secondary | ICD-10-CM

## 2013-11-26 DIAGNOSIS — K089 Disorder of teeth and supporting structures, unspecified: Secondary | ICD-10-CM | POA: Insufficient documentation

## 2013-11-26 DIAGNOSIS — R509 Fever, unspecified: Secondary | ICD-10-CM | POA: Insufficient documentation

## 2013-11-26 MED ORDER — IBUPROFEN 800 MG PO TABS: 800.0000 mg | ORAL_TABLET | Freq: Three times a day (TID) | ORAL | Status: DC

## 2013-11-26 MED ORDER — PENICILLIN V POTASSIUM 500 MG PO TABS: 500.0000 mg | ORAL_TABLET | Freq: Four times a day (QID) | ORAL | Status: DC

## 2013-11-26 MED ORDER — HYDROCODONE-ACETAMINOPHEN 5-325 MG PO TABS: 1.0000 | ORAL_TABLET | ORAL | Status: DC | PRN

## 2013-11-26 NOTE — ED Notes (Signed)
The pt has had a toothache for 3 days 

## 2013-11-26 NOTE — ED Provider Notes (Signed)
CSN: [id number]     Arrival date & time 11/26/13  1845 History  This chart was scribed for Mellody Drown, PA-C working with Flint Melter, MD by Evon Slack, ED Scribe. This patient was seen in room TR11C/TR11C and the patient's care was started at 8:35 PM.     Chief Complaint  Patient presents with  . Dental Pain   HPI Comments: Patrick [name] is a 38 y.o. male who presents to the Emergency Department complaining of right lower dental pain onset 3 days prior. He states he has subjective fever and chills. He states he has been taken aleve and aspirin with no relief. He states he took half of an oxycodone with no relief. He states that he currently does not have a dentist. He states that he is a current every day smoker.  Patient is a 38 y.o. male presenting with tooth pain. The history is provided by the patient. No language interpreter was used.  Dental Pain Associated symptoms: fever       Past Medical History  Diagnosis Date  . Eczema    History reviewed. No pertinent past surgical history. No family history on file. History  Substance Use Topics  . Smoking status: Current Every Day Smoker  . Smokeless tobacco: Not on file  . Alcohol Use: Yes    Review of Systems  Constitutional: Positive for fever and chills.  HENT: Positive for dental problem.     Allergies  Mushroom extract complex  Home Medications   Prior to Admission medications   Medication Sig Start Date End Date Taking? Authorizing Provider  aspirin EC 325 MG tablet Take 650 mg by mouth once.   Yes Historical Provider, MD  Multiple Vitamin (MULTIVITAMIN WITH MINERALS) TABS Take 1 tablet by mouth at bedtime.    Yes Historical Provider, MD  naproxen sodium (ALEVE) 220 MG tablet Take 440 mg by mouth 4 (four) times daily as needed (pain).   Yes Historical Provider, MD  Pseudoephedrine-APAP-DM (DAYQUIL PO) Take 2 capsules by mouth once.   Yes Historical Provider, MD  triamcinolone cream (KENALOG) 0.1 % Apply 1  application topically 2 (two) times daily as needed (eczema).   Yes Historical Provider, MD   Triage Vitals: BP 147/107  Pulse 74  Temp(Src) 98.2 F (36.8 C)  Resp 18  Ht 5\' 11"  (1.803 m)  Wt 190 lb (86.183 kg)  BMI 26.51 kg/m2  SpO2 96%  Physical Exam  Nursing note and vitals reviewed. Constitutional: He is oriented to person, place, and time. He appears well-developed and well-nourished.  Non-toxic appearance. He does not have a sickly appearance. He does not appear ill. No distress.  HENT:  Head: Normocephalic and atraumatic.  Mouth/Throat: Uvula is midline and oropharynx is clear and moist. No trismus in the jaw. Abnormal dentition. Dental caries present. No dental abscesses.  Tenderness to palpation of tooth # 31. Large cavity in tooth #31 No signs of peritonsillar or tonsillar abscess. No signs of gingival abscess. Oropharynx is clear and without exudates. Soft non-tender sublingual mucosa, no tongue elevation, no edema to sublingual space, normal voice. Airway patent.   Eyes: Conjunctivae and EOM are normal.  Neck: Neck supple.  Pulmonary/Chest: Effort normal. No respiratory distress.  Musculoskeletal: Normal range of motion.  Neurological: He is alert and oriented to person, place, and time.  Skin: Skin is warm and dry. He is not diaphoretic.  Psychiatric: He has a normal mood and affect. His behavior is normal.    ED Course  Procedures (including critical care time) DIAGNOSTIC STUDIES: Oxygen Saturation is 96% on RA, adequate by my interpretation.    COORDINATION OF CARE: 8:38 PM-Discussed treatment plan which includes pain medication, antibiotics, and dentist referral with pt at bedside and pt agreed to plan.     Labs Review Labs Reviewed - No data to display  Imaging Review No results found.   EKG Interpretation None      MDM   Final diagnoses:  Pain, dental   Patient with toothache.  No gross abscess.  Exam unconcerning for Ludwig's angina or spread  of infection.  Will treat with penicillin and pain medicine.  Urged patient to follow-up with dentist.    Meds given in ED:  Medications - No data to display  Discharge Medication List as of 11/26/2013  8:57 PM    START taking these medications   Details  HYDROcodone-acetaminophen (NORCO/VICODIN) 5-325 MG per tablet Take 1 tablet by mouth every 4 (four) hours as needed., Starting 11/26/2013, Until Discontinued, Print    ibuprofen (ADVIL,MOTRIN) 800 MG tablet Take 1 tablet (800 mg total) by mouth 3 (three) times daily with meals., Starting 11/26/2013, Until Discontinued, Print    penicillin v potassium (VEETID) 500 MG tablet Take 1 tablet (500 mg total) by mouth 4 (four) times daily., Starting 11/26/2013, Until Discontinued, Print        I personally performed the services described in this documentation, which was scribed in my presence. The recorded information has been reviewed and is accurate.      Clabe SealLauren M Tabius Rood, PA-C 11/27/13 [phone]

## 2013-11-26 NOTE — Discharge Instructions (Signed)
Call Dr. Lucky CowboyKnox, or another dentist, for further evaluation of your dental pain. Use the resource guide listed below to help you find a dentist if you do not already have one to followup with. It is very important that you get evaluated by a dentist as soon as possible. Call tomorrow to schedule an appointment. Use your pain medication as prescribed and do not operate heavy machinery while on pain medication. Note that your pain medication contains acetaminophen (Tylenol) & its is not reccommended that you use additional acetaminophen (Tylenol) while taking this medication. Take your full course of antibiotics. Read the instructions below.  Eat a soft or liquid diet and rinse your mouth out after meals with warm water. You should see a dentist or return here at once if you have increased swelling, increased pain or uncontrolled bleeding from the site of your injury.   SEEK MEDICAL CARE IF:   You have increased pain not controlled with medicines.   You have swelling around your tooth, in your face or neck.   You have bleeding which starts, continues, or gets worse.   You have a fever >101  If you are unable to open your mouth  RESOURCE GUIDE  Dental Problems  Patients with Medicaid: [facility]                     [facility] [phone]                                           [phone] Phone:  [phone]                                                  Phone:  [phone]  If unable to pay or uninsured, contact:  [facility] or [facility] to become qualified for the adult dental clinic.  Chronic Pain Problems Contact [facility]  [phone] Patients need to be referred by their primary care doctor.  Insufficient Money for Medicine Contact United Way:  call "211" or [facility] [phone].  No Primary Care Doctor Call [facility]  [phone] Other agencies that provide inexpensive medical care  [facility]  [phone]    [facility]  [phone]    [facility]  [phone]    [facility]  [phone]    [facility]  [phone]    [facility]  [phone]  [facility]  [phone] [facility]  [phone] [facility]   [phone] (emergency services [phone])  Substance Abuse Resources [facility]  [phone] [facility] [phone] [facility] [phone] [facility] [phone] Residential & Outpatient Substance Abuse Program  [phone]  Abuse/Neglect [facility] Child [facility] Hotline 7078760417(336) [phone] [facility] Hotline [phone] (After Hours)  Emergency Shelter [facility] [phone] [phone]  [facility] [phone] [facility] [phone]) [phone]  MRSA Hotline #:   (218)119-9759(519)071-9744    [facility]  [facility]     [employer]  [facility] [address]      [place] Phone:  [phone]                                   Phone:  [phone]                 Phone:  [place] Phone:  [place] [phone] (After Hours)

## 2013-11-27 NOTE — ED Provider Notes (Signed)
Medical screening examination/treatment/procedure(s) were performed by non-physician practitioner and as supervising physician I was immediately available for consultation/collaboration.  Emberleigh Reily L Yazmyne Sara, MD 11/27/13 2338 

## 2015-06-03 ENCOUNTER — Encounter (HOSPITAL_COMMUNITY): Payer: Self-pay | Admitting: *Deleted

## 2015-06-03 ENCOUNTER — Emergency Department (HOSPITAL_COMMUNITY)
Admission: EM | Admit: 2015-06-03 | Discharge: 2015-06-04 | Disposition: A | Discharge status: Home / Self Care | Payer: Self-pay | Attending: Emergency Medicine | Admitting: Emergency Medicine

## 2015-06-03 DIAGNOSIS — R1012 Left upper quadrant pain: Secondary | ICD-10-CM | POA: Insufficient documentation

## 2015-06-03 DIAGNOSIS — R197 Diarrhea, unspecified: Secondary | ICD-10-CM | POA: Insufficient documentation

## 2015-06-03 DIAGNOSIS — Z8719 Personal history of other diseases of the digestive system: Secondary | ICD-10-CM | POA: Insufficient documentation

## 2015-06-03 DIAGNOSIS — Z872 Personal history of diseases of the skin and subcutaneous tissue: Secondary | ICD-10-CM | POA: Insufficient documentation

## 2015-06-03 DIAGNOSIS — F17200 Nicotine dependence, unspecified, uncomplicated: Secondary | ICD-10-CM | POA: Insufficient documentation

## 2015-06-03 DIAGNOSIS — R0602 Shortness of breath: Secondary | ICD-10-CM | POA: Insufficient documentation

## 2015-06-03 DIAGNOSIS — Z79899 Other long term (current) drug therapy: Secondary | ICD-10-CM | POA: Insufficient documentation

## 2015-06-03 LAB — URINALYSIS, ROUTINE W REFLEX MICROSCOPIC
BILIRUBIN URINE: NEGATIVE
Glucose, UA: NEGATIVE mg/dL
HGB URINE DIPSTICK: NEGATIVE
Ketones, ur: NEGATIVE mg/dL
Leukocytes, UA: NEGATIVE
Nitrite: NEGATIVE
PROTEIN: NEGATIVE mg/dL
Specific Gravity, Urine: 1.004 — ABNORMAL LOW (ref 1.005–1.030)
pH: 5.5 (ref 5.0–8.0)

## 2015-06-03 LAB — COMPREHENSIVE METABOLIC PANEL
ALK PHOS: 53 U/L (ref 38–126)
ALT: 67 U/L — AB (ref 17–63)
AST: 39 U/L (ref 15–41)
Albumin: 4.1 g/dL (ref 3.5–5.0)
Anion gap: 12 (ref 5–15)
BUN: 10 mg/dL (ref 6–20)
CALCIUM: 8.9 mg/dL (ref 8.9–10.3)
CO2: 20 mmol/L — AB (ref 22–32)
Chloride: 103 mmol/L (ref 101–111)
Creatinine, Ser: 0.96 mg/dL (ref 0.61–1.24)
GFR calc non Af Amer: >60 mL/min (ref >60)
GLUCOSE: 120 mg/dL — AB (ref 65–99)
Potassium: 3.7 mmol/L (ref 3.5–5.1)
Sodium: 135 mmol/L (ref 135–145)
Total Bilirubin: 0.7 mg/dL (ref 0.3–1.2)
Total Protein: 6.6 g/dL (ref 6.5–8.1)

## 2015-06-03 LAB — CBC
HCT: 45.7 % (ref 39.0–52.0)
Hemoglobin: 16.3 g/dL (ref 13.0–17.0)
MCH: 34.1 pg — AB (ref 26.0–34.0)
MCHC: 35.7 g/dL (ref 30.0–36.0)
MCV: 95.6 fL (ref 78.0–100.0)
Platelets: 162 10*3/uL (ref 150–400)
RBC: 4.78 MIL/uL (ref 4.22–5.81)
RDW: 12.5 % (ref 11.5–15.5)
WBC: 8.3 10*3/uL (ref 4.0–10.5)

## 2015-06-03 LAB — LIPASE, BLOOD: Lipase: 24 U/L (ref 11–51)

## 2015-06-03 MED ORDER — ALUM & MAG HYDROXIDE-SIMETH 200-200-20 MG/5ML PO SUSP
30.0000 mL | Freq: Once | ORAL | Status: AC
  Administered 2015-06-03: 30 mL via ORAL
  Filled 2015-06-03: qty 30

## 2015-06-03 MED ORDER — RANITIDINE HCL 150 MG/10ML PO SYRP
300.0000 mg | ORAL_SOLUTION | Freq: Once | ORAL | Status: AC
  Administered 2015-06-03: 300 mg via ORAL
  Filled 2015-06-03: qty 20

## 2015-06-03 MED ORDER — SUCRALFATE 1 GM/10ML PO SUSP
1.0000 g | Freq: Once | ORAL | Status: AC
  Administered 2015-06-04: 1 g via ORAL
  Filled 2015-06-03 (×2): qty 10

## 2015-06-03 MED ORDER — SODIUM CHLORIDE 0.9 % IV BOLUS (SEPSIS): 1000.0000 mL | Freq: Once | INTRAVENOUS | Status: DC

## 2015-06-03 NOTE — ED Notes (Signed)
Dr. Oni at the bedside.  

## 2015-06-03 NOTE — Discharge Instructions (Signed)
Abdominal Pain, Adult Patrick [name], see gastroenterology within 3 days for close follow up.  Take ranitidine as needed for your pain.  If symptoms worsen, come back to the ED immediately. Thank you. Many things can cause belly (abdominal) pain. Most times, the belly pain is not dangerous. Many cases of belly pain can be watched and treated at home. HOME CARE   Do not take medicines that help you go poop (laxatives) unless told to by your doctor.  Only take medicine as told by your doctor.  Eat or drink as told by your doctor. Your doctor will tell you if you should be on a special diet. GET HELP IF:  You do not know what is causing your belly pain.  You have belly pain while you are sick to your stomach (nauseous) or have runny poop (diarrhea).  You have pain while you pee or poop.  Your belly pain wakes you up at night.  You have belly pain that gets worse or better when you eat.  You have belly pain that gets worse when you eat fatty foods.  You have a fever. GET HELP RIGHT AWAY IF:   The pain does not go away within 2 hours.  You keep throwing up (vomiting).  The pain changes and is only in the right or left part of the belly.  You have bloody or tarry looking poop. MAKE SURE YOU:   Understand these instructions.  Will watch your condition.  Will get help right away if you are not doing well or get worse.   This information is not intended to replace advice given to you by your health care provider. Make sure you discuss any questions you have with your health care provider.   Document Released: 10/04/2007 Document Revised: 05/08/2014 Document Reviewed: 12/25/2012 Elsevier Interactive Patient Education [employer]

## 2015-06-03 NOTE — ED Notes (Signed)
The pt has been drinking alcohol

## 2015-06-03 NOTE — ED Notes (Signed)
The pt has had abd pain for one month with n v diarrhea

## 2015-06-03 NOTE — ED Provider Notes (Signed)
CSN: [id number]     Arrival date & time 06/03/15  2131 History   By signing my name below, I, Patrick [name], attest that this documentation has been prepared under the direction and in the presence of Tomasita Crumble, MD. Electronically Signed: Lyndel [name], ED Scribe. 06/03/2015. 11:24 PM.     Chief Complaint  Patient presents with  . Abdominal Pain    The history is provided by the patient. No language interpreter was used.   HPI Comments: Patrick [name] is a 40 y.o. male, with a h/o GERD, who presents to the Emergency Department complaining of intermittent LUQ abdominal pain that has been present for ~ 1 month but worsened in severity and became a waxing and waning pain over the past week. He notes no pattern to the pain. Pt associates SOB. The pt also reports an episode of loose stool each morning occurring for approximately 1 month. He states he has been consuming excessive EtOH over the course of his symptoms but he denies drinking EtOH daily. He also denies nausea, vomiting, or diaphoresis.   Past Medical History  Diagnosis Date  . Eczema    History reviewed. No pertinent past surgical history. No family history on file. Social History  Substance Use Topics  . Smoking status: Current Every Day Smoker  . Smokeless tobacco: None  . Alcohol Use: Yes    Review of Systems  Constitutional: Negative for diaphoresis.  Respiratory: Positive for shortness of breath.   Gastrointestinal: Positive for abdominal pain and diarrhea. Negative for nausea and vomiting.  A complete 10 system review of systems was obtained and is otherwise negative except at noted in the HPI and PMH.  Allergies  Mushroom extract complex  Home Medications   Prior to Admission medications   Medication Sig Start Date End Date Taking? Authorizing Provider  Multiple Vitamin (MULTIVITAMIN WITH MINERALS) TABS Take 1 tablet by mouth at bedtime.    Yes Historical Provider, MD  Pseudoephedrine-APAP-DM (DAYQUIL PO) Take  2 capsules by mouth once.   Yes Historical Provider, MD  triamcinolone cream (KENALOG) 0.1 % Apply 1 application topically 2 (two) times daily as needed (eczema).   Yes Historical Provider, MD   BP 121/83 mmHg  Pulse 101  Temp(Src) 97.6 F (36.4 C)  Resp 18  Ht  (1.803 m)  Wt 201 lb 5 oz (91.315 kg)  BMI 28.09 kg/m2  SpO2 92% Physical Exam  Constitutional: He is oriented to person, place, and time. Vital signs are normal. He appears well-developed and well-nourished.  Non-toxic appearance. He does not appear ill. No distress.  HENT:  Head: Normocephalic and atraumatic.  Nose: Nose normal.  Mouth/Throat: Oropharynx is clear and moist. No oropharyngeal exudate.  Eyes: Conjunctivae and EOM are normal. Pupils are equal, round, and reactive to light. No scleral icterus.  Neck: Normal range of motion. Neck supple. No tracheal deviation, no edema, no erythema and normal range of motion present. No thyroid mass and no thyromegaly present.  Cardiovascular: Normal rate, regular rhythm, S1 normal, S2 normal, normal heart sounds, intact distal pulses and normal pulses.  Exam reveals no gallop and no friction rub.   No murmur heard. Pulmonary/Chest: Effort normal and breath sounds normal. No respiratory distress. He has no wheezes. He has no rhonchi. He has no rales.  Abdominal: Soft. Normal appearance and bowel sounds are normal. He exhibits no distension, no ascites and no mass. There is no hepatosplenomegaly. There is tenderness. There is no rebound, no guarding and no CVA  tenderness.  LUQ tenderness on palpation.   Musculoskeletal: Normal range of motion. He exhibits no edema or tenderness.  Lymphadenopathy:    He has no cervical adenopathy.  Neurological: He is alert and oriented to person, place, and time. He has normal strength. No cranial nerve deficit or sensory deficit.  Skin: Skin is warm, dry and intact. No petechiae and no rash noted. He is not diaphoretic. No erythema. No pallor.   Psychiatric: He has a normal mood and affect. His behavior is normal. Judgment normal.  Nursing note and vitals reviewed.   ED Course  Procedures  DIAGNOSTIC STUDIES: Oxygen Saturation is 92% on RA, adequate by my interpretation.    COORDINATION OF CARE: 11:11 PM Discussed treatment plan with pt. Pt acknowledges and agrees to plan.   Labs Review Labs Reviewed  COMPREHENSIVE METABOLIC PANEL - Abnormal; Notable for the following:    CO2 20 (*)    Glucose, Bld 120 (*)    ALT 67 (*)    All other components within normal limits  CBC - Abnormal; Notable for the following:    MCH 34.1 (*)    All other components within normal limits  URINALYSIS, ROUTINE W REFLEX MICROSCOPIC (NOT AT [facility]) - Abnormal; Notable for the following:    Specific Gravity, Urine 1.004 (*)    All other components within normal limits  LIPASE, BLOOD    Imaging Review No results found. I have personally reviewed and evaluated these images and lab results as part of my medical decision-making.   EKG Interpretation None      MDM   Final diagnoses:  None   Patient presents to the ED for abdominal pain in his LUQ.  He describes no associated symptoms.  He does have a history of GERD and this may be worsening reflux or an ulcer.  He was given maalox, ranitidine, and sucralfate for treatment.  Labs are unremarkable.  GI follow up recommended within 3 days.    Upon repeat evaluation, patient states this medication did not work.  He takes it at home without relief.  He states he "feels like he is wasting his time here and wants to go home". Of note, patient was sleeping in the room and in NAD.  He was encouraged to follow up with GI, and fu was provided.  He appears well and in NAD.  VS remain within his normal limits and he is [name] for Dc.    I personally performed the services described in this documentation, which was scribed in my presence. The recorded information has been reviewed and is accurate.      Tomasita Crumble, MD 06/04/15 [phone]

## 2015-06-03 NOTE — ED Notes (Signed)
Patient reports ETOH consumed today, no hx of abdominal surgeries.

## 2015-06-04 NOTE — ED Notes (Signed)
Pharmacy called for carafate

## 2016-03-02 ENCOUNTER — Emergency Department (HOSPITAL_COMMUNITY)
Admission: EM | Admit: 2016-03-02 | Discharge: 2016-03-02 | Disposition: A | Discharge status: Home / Self Care | Payer: Self-pay | Attending: Emergency Medicine | Admitting: Emergency Medicine

## 2016-03-02 ENCOUNTER — Encounter (HOSPITAL_COMMUNITY): Payer: Self-pay | Admitting: *Deleted

## 2016-03-02 DIAGNOSIS — K0889 Other specified disorders of teeth and supporting structures: Secondary | ICD-10-CM | POA: Insufficient documentation

## 2016-03-02 DIAGNOSIS — F17200 Nicotine dependence, unspecified, uncomplicated: Secondary | ICD-10-CM | POA: Insufficient documentation

## 2016-03-02 DIAGNOSIS — L259 Unspecified contact dermatitis, unspecified cause: Secondary | ICD-10-CM | POA: Insufficient documentation

## 2016-03-02 MED ORDER — PREDNISONE 20 MG PO TABS: 40.0000 mg | ORAL_TABLET | Freq: Every day | ORAL | 0 refills | Status: DC

## 2016-03-02 MED ORDER — PENICILLIN V POTASSIUM 500 MG PO TABS: 500.0000 mg | ORAL_TABLET | Freq: Four times a day (QID) | ORAL | 0 refills | Status: DC

## 2016-03-02 NOTE — ED Triage Notes (Signed)
Pt reports having rash to arms and legs x 3 weeks. Rash is itching, no relief with otc creams. Also having left upper dental pain. Airway intact.

## 2016-03-02 NOTE — ED Notes (Signed)
Itching rash to ankles and wrists "for a while". States has hx of ECZEMA but not responding to meds used in past.'  Also, c/o left upper dental pain x 2 weeks.

## 2016-03-02 NOTE — ED Provider Notes (Signed)
[facility] Provider Note   CSN: [id number] Arrival date & time: 03/02/16  1256  By signing my name below, I, Freida Busmaniana Omoyeni, attest that this documentation has been prepared under the direction and in the presence of non-physician practitioner, Roxy Horsemanobert Kapono Luhn, PA-C. Electronically Signed: Freida Busmaniana Omoyeni, Scribe. 03/02/2016. 1:51 PM.    History   Chief Complaint Chief Complaint  Patient presents with  . Rash  . Dental Pain    The history is provided by the patient. No language interpreter was used.    HPI Comments:  Patrick [name] is a 40 y.o. male who presents to the Emergency Department complaining of moderate left upper dental pain for the last 1-1.5 weeks. Pt notes he chipped a tooth in that area ~2-3 weeks ago. He has been taking advil and tylenol without relief. Pt does not have a dentist to follow up with.  He is also complaining of a pruritic rash to his bilateral ankles and left wrist x ~3 weeks. He states he has scratched the areas so much he now has a burning sensation st the sites. He has used OTC steroid creams and anti-fungals without relief. He does not believe he has been in the proximity of poison ivy/poison oak.   Past Medical History:  Diagnosis Date  . Eczema     There are no active problems to display for this patient.   History reviewed. No pertinent surgical history.     Home Medications    Prior to Admission medications   Medication Sig Start Date End Date Taking? Authorizing Provider  Multiple Vitamin (MULTIVITAMIN WITH MINERALS) TABS Take 1 tablet by mouth at bedtime.     Historical Provider, MD  Pseudoephedrine-APAP-DM (DAYQUIL PO) Take 2 capsules by mouth once.    Historical Provider, MD  triamcinolone cream (KENALOG) 0.1 % Apply 1 application topically 2 (two) times daily as needed (eczema).    Historical Provider, MD    Family History History reviewed. No pertinent family history.  Social History Social History  Substance Use  Topics  . Smoking status: Current Every Day Smoker  . Smokeless tobacco: Not on file  . Alcohol use Yes     Allergies   Mushroom extract complex   Review of Systems Review of Systems  Constitutional: Negative for chills and fever.  HENT: Positive for dental problem.   Skin: Positive for rash.     Physical Exam Updated Vital Signs BP 125/93 (BP Location: Left Arm)   Pulse 93   Temp 98.2 F (36.8 C) (Oral)   Resp 18   SpO2 97%   Physical Exam Physical Exam  Constitutional: Pt appears well-developed and well-nourished.  HENT:  Head: Normocephalic.  Right Ear: Tympanic membrane, external ear and ear canal normal.  Left Ear: Tympanic membrane, external ear and ear canal normal.  Nose: Nose normal. Right sinus exhibits no maxillary sinus tenderness and no frontal sinus tenderness. Left sinus exhibits no maxillary sinus tenderness and no frontal sinus tenderness.  Mouth/Throat: Uvula is midline, oropharynx is clear and moist and mucous membranes are normal. No oral lesions. No uvula swelling or lacerations. No oropharyngeal exudate, posterior oropharyngeal edema, posterior oropharyngeal erythema or tonsillar abscesses.  Poor dentition No gingival swelling, fluctuance or induration No gross abscess  No sublingual edema, tenderness to palpation, or sign of Ludwig's angina, or deep space infection Pain at left upper rear molar Eyes: Conjunctivae are normal. Pupils are equal, round, and reactive to light. Right eye exhibits no discharge. Left eye exhibits no discharge.  Neck: Normal range of motion. Neck supple.  No stridor Handling secretions without difficulty No nuchal rigidity No cervical lymphadenopathy Cardiovascular: Normal rate, regular rhythm and normal heart sounds.   Pulmonary/Chest: Effort normal. No respiratory distress.  Equal chest rise  Abdominal: Soft. Bowel sounds are normal. Pt exhibits no distension. There is no tenderness.  Lymphadenopathy: Pt has no  cervical adenopathy.  Neurological: Pt is alert and oriented x 4  Skin: Skin is warm and dry. contact dermatitis on wrists and lower legs, no evidence of cellulitis or abscess Psychiatric: Pt has a normal mood and affect.  Nursing note and vitals reviewed.    ED Treatments / Results  DIAGNOSTIC STUDIES:  Oxygen Saturation is 97% on RA, normal by my interpretation.    COORDINATION OF CARE:  1:53 PM Discussed treatment plan with pt at bedside and pt agreed to plan.  Labs (all labs ordered are listed, but only abnormal results are displayed) Labs Reviewed - No data to display  EKG  EKG Interpretation None       Radiology No results found.  Procedures Procedures (including critical care time)  Medications Ordered in ED Medications - No data to display   Initial Impression / Assessment and Plan / ED Course  I have reviewed the triage vital signs and the nursing notes.  Pertinent labs & imaging results that were available during my care of the patient were reviewed by me and considered in my medical decision making (see chart for details).  Clinical Course      Patient with dentalgia.  No abscess requiring immediate incision and drainage.  Exam not concerning for Ludwig's angina or pharyngeal abscess.  Will treat with penicillin. Pt instructed to follow-up with dentist.  Discussed return precautions. Pt safe for discharge.  Patient with contact dermatitis. Instructed to avoid offending agent and to use unscented soaps, lotions, and detergents. Will treat with prednisone taper.  No signs of secondary infection. Follow up with PCP in 2-3 days. Return precautions discussed. Pt is safe for discharge at this time.   Final Clinical Impressions(s) / ED Diagnoses   Final diagnoses:  Pain, dental  Contact dermatitis, unspecified contact dermatitis type, unspecified trigger    New Prescriptions New Prescriptions   PENICILLIN V POTASSIUM (VEETID) 500 MG TABLET    Take 1  tablet (500 mg total) by mouth 4 (four) times daily.   PREDNISONE (DELTASONE) 20 MG TABLET    Take 2 tablets (40 mg total) by mouth daily. Take 40 mg by mouth daily for 3 days, then 20mg  by mouth daily for 3 days, then 10mg  daily for 3 days    I personally performed the services described in this documentation, which was scribed in my presence. The recorded information has been reviewed and is accurate.       Roxy Horsemanobert Leightyn Cina, PA-C 03/02/16 1405    Gerhard Munchobert Lockwood, MD 03/02/16 1728

## 2016-03-02 NOTE — ED Notes (Signed)
[facility] COUPON 186 GIVEN.

## 2016-05-26 ENCOUNTER — Emergency Department (HOSPITAL_COMMUNITY)
Admission: EM | Admit: 2016-05-26 | Discharge: 2016-05-26 | Disposition: A | Discharge status: Home / Self Care | Payer: Self-pay | Attending: Emergency Medicine | Admitting: Emergency Medicine

## 2016-05-26 ENCOUNTER — Emergency Department (HOSPITAL_COMMUNITY): Payer: Self-pay

## 2016-05-26 ENCOUNTER — Encounter (HOSPITAL_COMMUNITY): Payer: Self-pay | Admitting: *Deleted

## 2016-05-26 DIAGNOSIS — F17200 Nicotine dependence, unspecified, uncomplicated: Secondary | ICD-10-CM | POA: Insufficient documentation

## 2016-05-26 DIAGNOSIS — J069 Acute upper respiratory infection, unspecified: Secondary | ICD-10-CM | POA: Insufficient documentation

## 2016-05-26 DIAGNOSIS — L299 Pruritus, unspecified: Secondary | ICD-10-CM | POA: Insufficient documentation

## 2016-05-26 DIAGNOSIS — R059 Cough, unspecified: Secondary | ICD-10-CM

## 2016-05-26 DIAGNOSIS — R05 Cough: Secondary | ICD-10-CM

## 2016-05-26 MED ORDER — BENZONATATE 100 MG PO CAPS: 100.0000 mg | ORAL_CAPSULE | Freq: Three times a day (TID) | ORAL | 0 refills | Status: DC

## 2016-05-26 MED ORDER — AZITHROMYCIN 250 MG PO TABS: 250.0000 mg | ORAL_TABLET | Freq: Every day | ORAL | 0 refills | Status: DC

## 2016-05-26 MED ORDER — CLOTRIMAZOLE-BETAMETHASONE 1-0.05 % EX CREA: TOPICAL_CREAM | CUTANEOUS | 0 refills | Status: DC

## 2016-05-26 NOTE — Discharge Instructions (Signed)
It was my pleasure taking care of you today!  Please take all of your antibiotics until finished! Tessalon as needed for cough.  Increase hydration.  It is important to have a primary care physician, especially when you have a chronic medical conditions such as eczema. Please follow up with a primary care provider of your choice or the clinic I have listed. Return to the emergency department for new or worsening symptoms, any additional concerns.

## 2016-05-26 NOTE — ED Provider Notes (Signed)
[facility] Provider Note   CSN: [id number] Arrival date & time: 05/26/16  [id number]     History   Chief Complaint Chief Complaint  Patient presents with  . Cough  . Influenza    HPI Patrick [name] is a 41 y.o. male.  The history is provided by the patient and medical records. No language interpreter was used.  Cough  Associated symptoms include sore throat. Pertinent negatives include no chills, no headaches and no shortness of breath.  Influenza  Presenting symptoms: cough and sore throat   Presenting symptoms: no fever, no headaches, no nausea, no shortness of breath and no vomiting   Associated symptoms: nasal congestion   Associated symptoms: no chills    Patrick [name] is a 41 y.o. male  with a PMH of eczema who presents to the Emergency Department complaining of productive cough. He states approximately 3 weeks ago he had flulike symptoms including body aches, fever, cough, congestion. He took Mucinex for a week as well as Tylenol as needed for fever. He notes that he felt much improved except for dry cough that was lingering. Patient reports over the last several days, cough has progressively worsened and become productive. He also reports having bilateral sinus pressure/pain. He reports fevers have subsided. No abdominal pain, nausea, vomiting, diarrhea. Denies sick contacts. + current daily smoker.   Patient additionally complaining of rash consistent with eczema flare to his left forearm which has been itching for the last several days. Patient states that he took steroids for this in November, which improved symptoms which have now returned.  Past Medical History:  Diagnosis Date  . Eczema     There are no active problems to display for this patient.   History reviewed. No pertinent surgical history.     Home Medications    Prior to Admission medications   Medication Sig Start Date End Date Taking? Authorizing Provider  azithromycin (ZITHROMAX) 250 MG  tablet Take 1 tablet (250 mg total) by mouth daily. Take first 2 tablets together, then 1 every day until finished. 05/26/16   Chase PicketJaime Pilcher Jesse Hirst, PA-C  benzonatate (TESSALON) 100 MG capsule Take 1 capsule (100 mg total) by mouth every 8 (eight) hours. 05/26/16   Chase PicketJaime Pilcher Tifini Reeder, PA-C  clotrimazole-betamethasone (LOTRISONE) cream Apply to affected area 2 times daily prn 05/26/16   [facility]ic[facility] LLCJaime Pilcher Jaysion Ramseyer, PA-C  Multiple Vitamin (MULTIVITAMIN WITH MINERALS) TABS Take 1 tablet by mouth at bedtime.     Historical Provider, MD  penicillin v potassium (VEETID) 500 MG tablet Take 1 tablet (500 mg total) by mouth 4 (four) times daily. 03/02/16   Roxy Horsemanobert Browning, PA-C  predniSONE (DELTASONE) 20 MG tablet Take 2 tablets (40 mg total) by mouth daily. Take 40 mg by mouth daily for 3 days, then 20mg  by mouth daily for 3 days, then 10mg  daily for 3 days 03/02/16   Roxy Horsemanobert Browning, PA-C  Pseudoephedrine-APAP-DM (DAYQUIL PO) Take 2 capsules by mouth once.    Historical Provider, MD  triamcinolone cream (KENALOG) 0.1 % Apply 1 application topically 2 (two) times daily as needed (eczema).    Historical Provider, MD    Family History History reviewed. No pertinent family history.  Social History Social History  Substance Use Topics  . Smoking status: Current Every Day Smoker  . Smokeless tobacco: Not on file  . Alcohol use Yes     Allergies   Mushroom extract complex   Review of Systems Review of Systems  Constitutional: Negative for chills and fever.  HENT: Positive for congestion and sore throat.   Eyes: Negative for visual disturbance.  Respiratory: Positive for cough. Negative for shortness of breath.   Cardiovascular: Negative.   Gastrointestinal: Negative for abdominal pain, nausea and vomiting.  Genitourinary: Negative for dysuria.  Musculoskeletal: Negative for back pain and neck pain.  Skin: Positive for rash.  Neurological: Negative for headaches.     Physical Exam Updated Vital Signs BP  118/96   Pulse 74   Temp 97.9 F (36.6 C) (Oral)   Resp 16   SpO2 95%   Physical Exam  Constitutional: He is oriented to person, place, and time. He appears well-developed and well-nourished. No distress.  HENT:  Head: Normocephalic and atraumatic.  OP with erythema, no exudates or tonsillar hypertrophy. + nasal congestion with mucosal edema.   Neck: Normal range of motion. Neck supple.  No meningeal signs.   Cardiovascular: Normal rate, regular rhythm and normal heart sounds.   Pulmonary/Chest: Effort normal.  Lungs are clear to auscultation bilaterally - no w/r/r  Abdominal: Soft. He exhibits no distension. There is no tenderness.  Musculoskeletal: Normal range of motion.  Neurological: He is alert and oriented to person, place, and time.  Skin: Skin is warm and dry. He is not diaphoretic.  Erythematous pruritic rash to left wrist and forearm with no surrounding erythema. No drainage or abscess appreciated.  Nursing note and vitals reviewed.    ED Treatments / Results  Labs (all labs ordered are listed, but only abnormal results are displayed) Labs Reviewed - No data to display  EKG  EKG Interpretation None       Radiology Dg Chest 2 View  Result Date: 05/26/2016 CLINICAL DATA:  Chest congestion and chest pain EXAM: CHEST  2 VIEW COMPARISON:  09/25/2010 FINDINGS: The heart size and mediastinal contours are within normal limits. Both lungs are clear. The visualized skeletal structures are unremarkable. IMPRESSION: No active cardiopulmonary disease. Electronically Signed   By: Alcide Clever M.D.   On: 05/26/2016 12:42    Procedures Procedures (including critical care time)  Medications Ordered in ED Medications - No data to display   Initial Impression / Assessment and Plan / ED Course  I have reviewed the triage vital signs and the nursing notes.  Pertinent labs & imaging results that were available during my care of the patient were reviewed by me and considered  in my medical decision making (see chart for details).    [name] is a 41 y.o. male who presents to ED for two complaints:   1. Cough, congestion 3 weeks. Patient notes he had a febrile illness 3 weeks ago associated with body aches, cough, congestion. Fever and body aches have resolved, but cough congestion has persisted. On exam, patient is afebrile and nontoxic appearing with a clear lung exam. Oropharynx with erythema but no exudates. Chest x-ray negative. Given he is a daily smoker with symptoms for 3 weeks now, discussed symptomatic treatment versus antibiotic treatment with Zithromax. Patient would like ABX today and Rx given. Rx for Tessalon also given.  2. Left forearm rash consistent with his typical eczema flares. Patient also endorses that he has had a fungal infection which appear to similarly. Appears more c/w eczema to me on exam. Will treat with Lotrisone cream.   Significant amount of time was taken to discuss the importance of primary care follow-up. Resources for PCP discussed and [facility] information given. Smoking cessation also encouraged. Reasons to return to ER discussed and all questions answered.  Blood pressure 118/96, pulse 74, temperature 97.9 F (36.6 C), temperature source Oral, resp. rate 16, SpO2 95 %.    Final Clinical Impressions(s) / ED Diagnoses   Final diagnoses:  Cough  Upper respiratory tract infection, unspecified type    New Prescriptions New Prescriptions   AZITHROMYCIN (ZITHROMAX) 250 MG TABLET    Take 1 tablet (250 mg total) by mouth daily. Take first 2 tablets together, then 1 every day until finished.   BENZONATATE (TESSALON) 100 MG CAPSULE    Take 1 capsule (100 mg total) by mouth every 8 (eight) hours.   CLOTRIMAZOLE-BETAMETHASONE (LOTRISONE) CREAM    Apply to affected area 2 times daily prn     [facility] Aneyah Lortz, PA-C 05/26/16 1303    Melene Plan, DO 05/26/16 1444

## 2016-05-26 NOTE — ED Notes (Signed)
Patient transported to X-ray 

## 2016-05-26 NOTE — ED Triage Notes (Addendum)
Pt reports having the flu approx 3 weeks ago. Had fever initially but now just has headache, bodyaches, cough and fatigue. Denies n/v/d. Mask on pt at triage.

## 2017-02-27 ENCOUNTER — Encounter (HOSPITAL_COMMUNITY): Payer: Self-pay | Admitting: Emergency Medicine

## 2017-02-27 ENCOUNTER — Emergency Department (HOSPITAL_COMMUNITY)
Admission: EM | Admit: 2017-02-27 | Discharge: 2017-02-27 | Disposition: A | Discharge status: Home / Self Care | Payer: Self-pay | Attending: Emergency Medicine | Admitting: Emergency Medicine

## 2017-02-27 DIAGNOSIS — Z630 Problems in relationship with spouse or partner: Secondary | ICD-10-CM

## 2017-02-27 DIAGNOSIS — F1910 Other psychoactive substance abuse, uncomplicated: Secondary | ICD-10-CM | POA: Insufficient documentation

## 2017-02-27 DIAGNOSIS — F1010 Alcohol abuse, uncomplicated: Secondary | ICD-10-CM | POA: Insufficient documentation

## 2017-02-27 DIAGNOSIS — R4689 Other symptoms and signs involving appearance and behavior: Secondary | ICD-10-CM

## 2017-02-27 DIAGNOSIS — Y908 Blood alcohol level of 240 mg/100 ml or more: Secondary | ICD-10-CM | POA: Insufficient documentation

## 2017-02-27 DIAGNOSIS — Z046 Encounter for general psychiatric examination, requested by authority: Secondary | ICD-10-CM

## 2017-02-27 DIAGNOSIS — F17200 Nicotine dependence, unspecified, uncomplicated: Secondary | ICD-10-CM | POA: Insufficient documentation

## 2017-02-27 DIAGNOSIS — R45851 Suicidal ideations: Secondary | ICD-10-CM | POA: Insufficient documentation

## 2017-02-27 DIAGNOSIS — Z79899 Other long term (current) drug therapy: Secondary | ICD-10-CM | POA: Insufficient documentation

## 2017-02-27 DIAGNOSIS — F1020 Alcohol dependence, uncomplicated: Secondary | ICD-10-CM

## 2017-02-27 LAB — CBC WITH DIFFERENTIAL/PLATELET
BASOS ABS: 0.1 10*3/uL (ref 0.0–0.1)
Basophils Relative: 1 %
EOS PCT: 1 %
Eosinophils Absolute: 0.1 10*3/uL (ref 0.0–0.7)
HEMATOCRIT: 48.6 % (ref 39.0–52.0)
HEMOGLOBIN: 17.6 g/dL — AB (ref 13.0–17.0)
LYMPHS ABS: 3.1 10*3/uL (ref 0.7–4.0)
LYMPHS PCT: 38 %
MCH: 35.3 pg — AB (ref 26.0–34.0)
MCHC: 36.2 g/dL — ABNORMAL HIGH (ref 30.0–36.0)
MCV: 97.4 fL (ref 78.0–100.0)
Monocytes Absolute: 0.3 10*3/uL (ref 0.1–1.0)
Monocytes Relative: 4 %
NEUTROS ABS: 4.6 10*3/uL (ref 1.7–7.7)
NEUTROS PCT: 56 %
Platelets: 169 10*3/uL (ref 150–400)
RBC: 4.99 MIL/uL (ref 4.22–5.81)
RDW: 12.5 % (ref 11.5–15.5)
WBC: 8.1 10*3/uL (ref 4.0–10.5)

## 2017-02-27 LAB — COMPREHENSIVE METABOLIC PANEL
ALK PHOS: 61 U/L (ref 38–126)
ALT: 86 U/L — ABNORMAL HIGH (ref 17–63)
ANION GAP: 15 (ref 5–15)
AST: 62 U/L — AB (ref 15–41)
Albumin: 4.4 g/dL (ref 3.5–5.0)
BILIRUBIN TOTAL: 0.6 mg/dL (ref 0.3–1.2)
BUN: 7 mg/dL (ref 6–20)
CALCIUM: 8.9 mg/dL (ref 8.9–10.3)
CO2: 22 mmol/L (ref 22–32)
Chloride: 105 mmol/L (ref 101–111)
Creatinine, Ser: 0.77 mg/dL (ref 0.61–1.24)
GFR calc Af Amer: >60 mL/min (ref >60)
Glucose, Bld: 141 mg/dL — ABNORMAL HIGH (ref 65–99)
POTASSIUM: 3.6 mmol/L (ref 3.5–5.1)
Sodium: 142 mmol/L (ref 135–145)
TOTAL PROTEIN: 7.8 g/dL (ref 6.5–8.1)

## 2017-02-27 LAB — RAPID URINE DRUG SCREEN, HOSP PERFORMED
Amphetamines: NOT DETECTED
BARBITURATES: NOT DETECTED
BENZODIAZEPINES: NOT DETECTED
Cocaine: NOT DETECTED
Opiates: NOT DETECTED
Tetrahydrocannabinol: NOT DETECTED

## 2017-02-27 LAB — ETHANOL: ALCOHOL ETHYL (B): 259 mg/dL — AB (ref <10)

## 2017-02-27 MED ORDER — LORAZEPAM 2 MG/ML IJ SOLN: 0.0000 mg | Freq: Two times a day (BID) | INTRAMUSCULAR | Status: DC

## 2017-02-27 MED ORDER — IBUPROFEN 200 MG PO TABS: 600.0000 mg | ORAL_TABLET | Freq: Three times a day (TID) | ORAL | Status: DC | PRN

## 2017-02-27 MED ORDER — VITAMIN B-1 100 MG PO TABS
100.0000 mg | ORAL_TABLET | Freq: Every day | ORAL | Status: DC
  Administered 2017-02-27: 100 mg via ORAL
  Filled 2017-02-27: qty 1

## 2017-02-27 MED ORDER — ACETAMINOPHEN 325 MG PO TABS
650.0000 mg | ORAL_TABLET | Freq: Once | ORAL | Status: AC
  Administered 2017-02-27: 650 mg via ORAL

## 2017-02-27 MED ORDER — ACETAMINOPHEN 325 MG PO TABS: 650.0000 mg | ORAL_TABLET | Freq: Four times a day (QID) | ORAL | Status: DC | PRN

## 2017-02-27 MED ORDER — IBUPROFEN 200 MG PO TABS
600.0000 mg | ORAL_TABLET | Freq: Three times a day (TID) | ORAL | Status: DC | PRN
  Administered 2017-02-27: 600 mg via ORAL
  Filled 2017-02-27: qty 3

## 2017-02-27 MED ORDER — THIAMINE HCL 100 MG/ML IJ SOLN: 100.0000 mg | Freq: Every day | INTRAMUSCULAR | Status: DC

## 2017-02-27 MED ORDER — LORAZEPAM 2 MG/ML IJ SOLN
0.0000 mg | Freq: Four times a day (QID) | INTRAMUSCULAR | Status: DC
Start: 2017-02-27 — End: 2017-02-27

## 2017-02-27 MED ORDER — ADULT MULTIVITAMIN W/MINERALS CH: 1.0000 | ORAL_TABLET | Freq: Every day | ORAL | Status: DC

## 2017-02-27 MED ORDER — LORAZEPAM 1 MG PO TABS: 0.0000 mg | ORAL_TABLET | Freq: Four times a day (QID) | ORAL | Status: DC

## 2017-02-27 MED ORDER — LORAZEPAM 1 MG PO TABS: 0.0000 mg | ORAL_TABLET | Freq: Two times a day (BID) | ORAL | Status: DC

## 2017-02-27 NOTE — [facility] Assessment (Signed)
[facility] Assessment Progress Note  Pt unable to be awakened enough to do assessment.  Will try again.

## 2017-02-27 NOTE — ED Provider Notes (Signed)
TIME SEEN: 3:25 AM  CHIEF COMPLAINT: Involuntary commitment  HPI: Patient is a 41 year old male with history of eczema who [facility] that was taken out by his brother Ladean RayaJustin Currie, [phone].  Per the involuntary committed paperwork patient has told several people that he plan to shoot himself and his girlfriend.  Patient denies this.  Patient has been drinking alcohol tonight but is unable to quantify the amount.  Denies drug use.  He denies any medical complaints.  He denies to me SI, HI or hallucinations.  ROS: See HPI Constitutional: no fever  Eyes: no drainage  ENT: no runny nose   Cardiovascular:  no chest pain  Resp: no SOB  GI: no vomiting GU: no dysuria Integumentary: no rash  Allergy: no hives  Musculoskeletal: no leg swelling  Neurological: no slurred speech ROS otherwise negative  PAST MEDICAL HISTORY/PAST SURGICAL HISTORY:  Past Medical History:  Diagnosis Date  . Eczema     MEDICATIONS:  Prior to Admission medications   Medication Sig Start Date End Date Taking? Authorizing Provider  azithromycin (ZITHROMAX) 250 MG tablet Take 1 tablet (250 mg total) by mouth daily. Take first 2 tablets together, then 1 every day until finished. 05/26/16   Jonathandavid Marlett, Chase PicketJaime Pilcher, PA-C  benzonatate (TESSALON) 100 MG capsule Take 1 capsule (100 mg total) by mouth every 8 (eight) hours. 05/26/16   Stokely Jeancharles, Chase PicketJaime Pilcher, PA-C  clotrimazole-betamethasone (LOTRISONE) cream Apply to affected area 2 times daily prn 05/26/16   Dannelle Rhymes, Chase PicketJaime Pilcher, PA-C  Multiple Vitamin (MULTIVITAMIN WITH MINERALS) TABS Take 1 tablet by mouth at bedtime.     [provider]  penicillin v potassium (VEETID) 500 MG tablet Take 1 tablet (500 mg total) by mouth 4 (four) times daily. 03/02/16   Roxy HorsemanBrowning, Robert, PA-C  predniSONE (DELTASONE) 20 MG tablet Take 2 tablets (40 mg total) by mouth daily. Take 40 mg by mouth daily  for 3 days, then 20mg  by mouth daily for 3 days, then 10mg  daily for 3 days 03/02/16   Roxy HorsemanBrowning, Robert, PA-C  Pseudoephedrine-APAP-DM (DAYQUIL PO) Take 2 capsules by mouth once.    [provider]  triamcinolone cream (KENALOG) 0.1 % Apply 1 application topically 2 (two) times daily as needed (eczema).    [provider]    ALLERGIES:  Allergies  Allergen Reactions  . Mushroom Extract Complex Diarrhea and Other (See Comments)    Itchy throat    SOCIAL HISTORY:  Social History  Substance Use Topics  . Smoking status: Current Every Day Smoker  . Smokeless tobacco: Never Used  . Alcohol use Yes    FAMILY HISTORY: History reviewed. No pertinent family history.  EXAM: BP 113/81 (BP Location: Left Arm)   Pulse 99   Temp 98.6 F (37 C) (Oral)   Resp 18   SpO2 98%  CONSTITUTIONAL: Alert and oriented and responds appropriately to questions. Well-appearing; well-nourished, patient is intoxicated but appears comfortable HEAD: Normocephalic, atraumatic EYES: Conjunctivae clear, pupils appear equal, EOMI ENT: normal nose; moist mucous membranes NECK: Supple, no meningismus, no nuchal rigidity, no LAD; no midline spinal tenderness or step-off or deformity CARD: RRR; S1 and S2 appreciated; no murmurs, no clicks, no rubs, no gallops RESP: Normal chest excursion without splinting or tachypnea; breath sounds clear and equal bilaterally; no wheezes, no rhonchi, no rales, no hypoxia or respiratory distress, speaking full sentences ABD/GI: Normal bowel sounds; non-distended; soft, non-tender, no rebound, no guarding, no peritoneal signs,  no hepatosplenomegaly BACK:  The back appears normal and is non-tender to palpation, there is no CVA tenderness, no midline spinal tenderness or step-off or deformity EXT: Normal ROM in all joints; non-tender to palpation; no edema; normal capillary refill; no cyanosis, no calf tenderness or swelling    SKIN: Normal color for age and race; warm;  no rash NEURO: Moves all extremities equally PSYCH: Intoxicated.  Laughing inappropriately.  Denies SI, HI or hallucinations.  MEDICAL DECISION MAKING: Patient here under involuntary commitment.  Reportedly told multiple people that he was going to shoot himself and his girlfriend.  Patient denies this.  He is intoxicated.  We will consult TTS once his alcohol level is under 200.  Labs unremarkable other than alcohol level of 259 and mild elevation of AST and ALT consistent with alcohol abuse.  Drug screen negative.  ED PROGRESS: Spoke to [employer] by phone.  He states things escalated Saturday evening at a Halloween party.  He states his brother is an alcoholic and possibly using illicit drugs.  Patient became upset with his girlfriend, accusing her of cheating.  Shouting at [employer] [name]) at party and was escorted out.  Came back later and threatened guests stating "he would shoot them up."  Also made comments about killing himself and his girlfriend.  Has had suicidal ideations in past per brother.  He has had psychiatric hospital admission before after injecting himself with his grandfather's insulin.  Patient has access to several firearms.  Patient's brother will contact sheriff to see if they can help with taking away patient's firearms while he is in the hospital.  Patient has been heavy drinker for 4-5 years.  No h/o withdrawal seizures or DTs per brother's knowledge.  Has h/o previous heroin abuse.   Given this history, I have completed my part of the IVC paperwork.  TTS will be consulted.  I feel patient needs inpatient psychiatric treatment.   Pt medically cleared.  Awaiting psych dispo.  I have recommended inpt psych treatment.   I reviewed all nursing notes, vitals, pertinent previous records, EKGs, lab and urine results, imaging (as available).    Kristel Durkee, Layla Maw, DO 02/27/17 [phone]

## 2017-02-27 NOTE — ED Notes (Signed)
Bed: WA28 Expected date:  Expected time:  Means of arrival:  Comments: [facility]/[facility] 

## 2017-02-27 NOTE — [facility] Suicide Risk Assessment (Addendum)
[facility]   Principal Problem: Behavior change due to substance use Discharge Diagnoses:  Patient Active Problem List   Diagnosis Date Noted  . Behavior change due to substance use [R46.89] 02/27/2017  . Alcohol use disorder, severe, dependence (HCC) [F10.20] 02/27/2017   Subjective: [name] [facility] placed by his brother. He reports that he does not remember what happened last night. He does remember getting into an argument with his girlfriend but he does not remember threatening anyone. He reports drinking 3-5 beers daily although he drank more this past weekend because he was at a party. He was drinking last night. BAL was 259 on admission. He reports drinking more in the past but denies a history of withdrawal symptoms or blackouts. He does not believe he has a problem with alcohol use and does not desire treatment for use. He reports tobacco use but denies other illicit substance use. He denies SI, HI or AVH. He denies a prior psychiatric history (although reported history of intentionally taking his grandfather's insulin to harm self) and denies taking psychotropic medications. He lives alone but his brother lives next door. He is agreeable to having the treatment team speak to his brother to obtain collateral. Please see documentation of phone conversation with patient's brother in another encounter. He denies a family history of psychiatric illness. He worries about losing his job at [employer] and would like to discharge to go to work this morning.    Total Time spent with patient: 45 minutes  Musculoskeletal: Strength & Muscle Tone: within normal limits Gait & Station: normal Patient leans: Right  Psychiatric Specialty Exam: Review of Systems  Psychiatric/Behavioral: Positive for substance abuse. Negative for depression, hallucinations and suicidal ideas. The patient is not nervous/anxious and does not have insomnia.     Blood pressure  (!) 140/97, pulse 88, temperature 98.2 F (36.8 C), temperature source Oral, resp. rate 18, SpO2 95 %.There is no height or weight on file to calculate BMI.  General Appearance: Well Groomed, Caucasian male with a shaved head, full beard and tattoos on his arms.   Eye Contact::  Good  Speech:  Normal Rate  Volume:  Normal  Mood:  "Okay"  Affect:  Appropriate and Congruent  Thought Process:  Goal Directed and Linear  Orientation:  Full (Time, Place, and Person)  Thought Content:  He reports no recollection of the events of last night.  Suicidal Thoughts:  No  Homicidal Thoughts:  No  Memory:  Immediate;   Good Recent;   Fair Remote;   Fair  Judgement:  Fair  Insight:  Fair  Psychomotor Activity:  Normal  Concentration:  Good  Recall:  Fair  Fund of Knowledge:Good  Language: Good  Akathisia:  No  Handed:  Right  AIMS (if indicated):     Assets:  Financial Resources/Insurance Housing Physical Health Social Support Transportation  Sleep:   Okay  Cognition: WNL  ADL's:  Intact   Mental Status Per Nursing Assessment::   On Admission:    Patient intoxicated on admission.  Demographic Factors:  Male, Caucasian, Living alone and Access to firearms (brother agrees to remove the guns from his home).   Loss Factors: Loss of significant relationship (recent discord with girlfriend and believes they have broke up)  Historical Factors: Prior suicide attempts and Impulsivity (reportedly took grandfather's insulin to harm self).  Risk Reduction Factors:   Employed and Positive social support  Continued Clinical Symptoms:  Alcohol/Substance Abuse/Dependencies  Cognitive Features That Contribute To Risk:  None    Suicide Risk:  Minimal: No identifiable suicidal ideation.  Patients presenting with no risk factors; may be classified as minimal risk based on the severity of the depressive symptoms    Plan Of Care/Follow-up recommendations:  Other:  Psychiatrically stable for  discharge. Discharge to home. Declines problem with substance use so will not provide resources for substance abuse. Patient's brother was contacted and will remove guns from the home. Patient was educated about risk for impulsivity in the setting of alcohol use/intoxication.  Patrick Beach, DO 02/27/2017, 1:43 PM

## 2017-02-27 NOTE — ED Notes (Signed)
Patient c/o right upper tooth pain.  MD notified.

## 2017-02-27 NOTE — ED Triage Notes (Signed)
[facility] for suicidal ideation ask claimed by his girlfriend who stated the pt said he was going to shoot her in the head and then kill himself

## 2017-02-27 NOTE — BH Assessment (Addendum)
Assessment Note  Patrick [name] is an 41 y.o. male.  -Clinician discussed patient with Dr. Elesa [name].  She said patient may not be trusted to tell the truth, was evasive with her somewhat. Dr. Elesa [name] contacted brother.  : Spoke to Patrick RayaJustin [name] by phone.  He states things escalated Saturday evening at a Halloween party.  He states his brother is an alcoholic and possibly using illicit drugs.  Patient became upset with his girlfriend, accusing her of cheating.  Shouting at [employer]F Patrick [name](Patrick [name]) at party and was escorted out.  Came back later and threatened guests stating "he would shoot them up."  Also made comments about killing himself and his girlfriend.  Has had suicidal ideations in past per brother.  He has had psychiatric hospital admission before after injecting himself with his grandfather's insulin.  Patient has access to several firearms.   Clinician talked to patient.  Initially patient was unable to be roused from sleep.  An hour later he was wanting ibuprophen for pain (toothache).    Patient is denying ever having said he wanted to kill his girlfriend.  He is denying ever having attempted to kill himself or having any intention to kill self.  Patient also denies any A/V hallucinations.  Patient does admit to having guns in the home.  But also says he has not made any threats to use any guns on anyone.  Patient admits to drinking daily.  He says he has drank more than his usual 4-5 cans of beer (12oz) per day lately.  He will not quantify how much.  Patient says that he has been drinking like this "for years."  Patient denies any withdrawal symptoms.  Pt denies use of other substances and his UDS is clear.  His BAL however was at 259.  Patient denies any previous inpatient or outpatient care.  Clinician did call and talk to pt's brother.  Patient was fine with gathering information from brother.  Brother says that patient did go to the party on Saturday evening and did the things that were mentioned  above.  He said that on Sunday some of the party attendees had told him (brother) about patient's behavior and the threats he made.  Brother said that patient had also texted their mother on Monday(10/29) and was saying things like "I'm going to end it all" and that he was going to shoot himself.  Patient had even called brother and verbally said the same things about shooting girlfriend in the head and "blowing my brains out."    Brother said that patient had several inpatient psychiatric hospitalizations as a teenager.  At some point he did try to overdose on insulin.  Patient has had outpatient counseling in the past also.  Brother said that patient's drinking has gotten worse over the last few days and he is concerned about patient safety and safety of girlfriend.  He said he thinks that gf is aware of the threats that patient has made.  Brother said that girlfriend had said something in a Facebook post about "needing to sleep with a loaded gun under the pillow."  Brother said that theirs is a toxic relationship and they are always fighting and she has firearms of her own.  -Clinician discussed patient care with Patrick SievertSpencer Simon, PA who recommends inpatient care.  First opinion to be completed by psychiatry.  Diagnosis: Bi-polar d/o; ETOH use d/o severe  Past Medical History:  Past Medical History:  Diagnosis Date  . Eczema  History reviewed. No pertinent surgical history.  Family History: History reviewed. No pertinent family history.  Social History:  reports that he has been smoking.  He has never used smokeless tobacco. He reports that he drinks alcohol. He reports that he does not use drugs.  Additional Social History:  Alcohol / Drug Use Pain Medications: None Prescriptions: None Over the Counter: ASA & advil lately to address toothache pain History of alcohol / drug use?: Yes Substance #1 Name of Substance 1: ETOH 1 - Age of First Use: Teenage years 1 - Amount (size/oz): 4-5  twelve ounce beers 1 - Frequency: Nightly 1 - Duration: on-going 1 - Last Use / Amount: 10/29 "I drank more yesterday."  CIWA: CIWA-Ar BP: 118/84 Pulse Rate: 98 Nausea and Vomiting: no nausea and no vomiting Tactile Disturbances: none Tremor: no tremor Auditory Disturbances: not present Paroxysmal Sweats: no sweat visible Visual Disturbances: not present Anxiety: mildly anxious Headache, Fullness in Head: none present Agitation: normal activity Orientation and Clouding of Sensorium: cannot do serial additions or is uncertain about date CIWA-Ar Total: 2 COWS:    Allergies:  Allergies  Allergen Reactions  . Mushroom Extract Complex Diarrhea and Other (See Comments)    Itchy throat    Home Medications:  (Not in a hospital admission)  OB/GYN Status:  No LMP for male patient.  General Assessment Data Assessment unable to be completed: Yes Reason for not completing assessment: Pt unable to be awakened. Location of Assessment: [facility] TTS Assessment: In system Is this a Tele or Face-to-Face Assessment?: Face-to-Face Is this an Initial Assessment or a Re-assessment for this encounter?: Initial Assessment Marital status: Single Is patient pregnant?: No Pregnancy Status: No Living Arrangements: Alone Can pt return to current living arrangement?: Yes Admission Status: Involuntary Is patient capable of signing voluntary admission?: No Referral Source: Self/Family/Friend (Pt referred by family member.) Insurance type: self pay     Crisis Care Plan Living Arrangements: Alone Name of Psychiatrist: None Name of Therapist: None  Education Status Is patient currently in school?: No Highest grade of school patient has completed: 12th grade  Risk to self with the past 6 months Suicidal Ideation: No Has patient been a risk to self within the past 6 months prior to admission? : No Suicidal Intent: No Has patient had any suicidal intent within the past 6 months prior to  admission? : No Is patient at risk for suicide?: No (Pt reports no claims to want to harm himself.) Suicidal Plan?: No Has patient had any suicidal plan within the past 6 months prior to admission? :  (None per patient) Access to Means: Yes (Guns in the home.) Specify Access to Suicidal Means: Guns in home but pt denies SI What has been your use of drugs/alcohol within the last 12 months?: ETOH use Previous Attempts/Gestures: No How many times?: 0 Other Self Harm Risks: None Triggers for Past Attempts: None known Intentional Self Injurious Behavior: None Family Suicide History: No Recent stressful life event(s): Other (Comment) (Pt denies stressful events) Persecutory voices/beliefs?: No Depression: No Depression Symptoms:  (Pt denies depressive symptoms) Substance abuse history and/or treatment for substance abuse?: Yes Suicide prevention information given to non-admitted patients: Not applicable  Risk to Others within the past 6 months Homicidal Ideation: No (Pt denies making threats to kill girlfriend.) Does patient have any lifetime risk of violence toward others beyond the six months prior to admission? : Unknown Thoughts of Harm to Others: No (Pt denies.) Current Homicidal Intent: No Current Homicidal  Plan: No (IVC papers say pt threatened to shoot girlfriend.) Access to Homicidal Means: Yes Describe Access to Homicidal Means: guns in the home Identified Victim: Girlfriend, although pt denies. History of harm to others?: No Assessment of Violence: None Noted Violent Behavior Description: Pt denies Does patient have access to weapons?: Yes (Comment) (Pt has guns in the home.) Criminal Charges Pending?: No Does patient have a court date: No Is patient on probation?: No  Psychosis Hallucinations: None noted Delusions: None noted  Mental Status Report Appearance/Hygiene: Unremarkable, In scrubs Eye Contact: Good Motor Activity: Freedom of movement, Unremarkable Speech:  Logical/coherent Level of Consciousness: Alert, Irritable Mood: Apprehensive Affect: Apprehensive Anxiety Level: None Thought Processes: Coherent, Relevant Judgement: Impaired Orientation: Appropriate for developmental age Obsessive Compulsive Thoughts/Behaviors: None  Cognitive Functioning Concentration: Normal Memory: Recent Intact, Remote Intact IQ: Average Insight: Fair Impulse Control: Fair Appetite: Good Weight Loss: 0 Weight Gain: 0 Sleep: No Change Total Hours of Sleep:  (4-5 hours is his normal.) Vegetative Symptoms: None  ADLScreening [facility] Assessment Services) Patient's cognitive ability adequate to safely complete daily activities?: Yes Patient able to express need for assistance with ADLs?: Yes Independently performs ADLs?: Yes (appropriate for developmental age)  Prior Inpatient Therapy Prior Inpatient Therapy: No Prior Therapy Dates: None Prior Therapy Facilty/Provider(s): N/A Reason for Treatment: N/A  Prior Outpatient Therapy Prior Outpatient Therapy: No Prior Therapy Dates: N/A Prior Therapy Facilty/Provider(s): N/A Reason for Treatment: N/A Does patient have an ACCT team?: No Does patient have Intensive In-House Services?  : No Does patient have [facility] services? : No Does patient have P4CC services?: No  ADL Screening (condition at time of admission) Patient's cognitive ability adequate to safely complete daily activities?: Yes Is the patient deaf or have difficulty hearing?: No Does the patient have difficulty seeing, even when wearing glasses/contacts?: No Does the patient have difficulty concentrating, remembering, or making decisions?: No Patient able to express need for assistance with ADLs?: Yes Does the patient have difficulty dressing or bathing?: No Independently performs ADLs?: Yes (appropriate for developmental age) Does the patient have difficulty walking or climbing stairs?: No Weakness of Legs: None Weakness of Arms/Hands:  None       Abuse/Neglect Assessment (Assessment to be complete while patient is alone) Physical Abuse: Denies Verbal Abuse: Denies Sexual Abuse: Denies Exploitation of patient/patient's resources: Denies Self-Neglect: Denies     Merchant navy officer (For Healthcare) Does Patient Have a Medical Advance Directive?: No Would patient like information on creating a medical advance directive?: No - Patient declined    Additional Information 1:1 In Past 12 Months?: No CIRT Risk: No Elopement Risk: No Does patient have medical clearance?: Yes     Disposition:  Disposition Initial Assessment Completed for this Encounter: Yes Disposition of Patient: Other dispositions Other disposition(s): Other (Comment) (Pt to be reviewed by PA)  On Site Evaluation by:   Reviewed with Physician:    Alexandria Lodge 02/27/2017 6:56 AM

## 2017-06-04 ENCOUNTER — Encounter (HOSPITAL_COMMUNITY): Payer: Self-pay | Admitting: Emergency Medicine

## 2017-06-04 DIAGNOSIS — R05 Cough: Secondary | ICD-10-CM | POA: Insufficient documentation

## 2017-06-04 DIAGNOSIS — Z5321 Procedure and treatment not carried out due to patient leaving prior to being seen by health care provider: Secondary | ICD-10-CM | POA: Insufficient documentation

## 2017-06-04 LAB — CBC WITH DIFFERENTIAL/PLATELET
Basophils Absolute: 0 10*3/uL (ref 0.0–0.1)
Basophils Relative: 1 %
EOS PCT: 2 %
Eosinophils Absolute: 0.1 10*3/uL (ref 0.0–0.7)
HCT: 48.2 % (ref 39.0–52.0)
HEMOGLOBIN: 16.8 g/dL (ref 13.0–17.0)
LYMPHS ABS: 2.6 10*3/uL (ref 0.7–4.0)
LYMPHS PCT: 30 %
MCH: 34.6 pg — AB (ref 26.0–34.0)
MCHC: 34.9 g/dL (ref 30.0–36.0)
MCV: 99.2 fL (ref 78.0–100.0)
MONOS PCT: 7 %
Monocytes Absolute: 0.6 10*3/uL (ref 0.1–1.0)
Neutro Abs: 5.2 10*3/uL (ref 1.7–7.7)
Neutrophils Relative %: 60 %
PLATELETS: 145 10*3/uL — AB (ref 150–400)
RBC: 4.86 MIL/uL (ref 4.22–5.81)
RDW: 13 % (ref 11.5–15.5)
WBC: 8.6 10*3/uL (ref 4.0–10.5)

## 2017-06-04 LAB — BASIC METABOLIC PANEL
Anion gap: 16 — ABNORMAL HIGH (ref 5–15)
BUN: 11 mg/dL (ref 6–20)
CHLORIDE: 105 mmol/L (ref 101–111)
CO2: 20 mmol/L — ABNORMAL LOW (ref 22–32)
Calcium: 9.4 mg/dL (ref 8.9–10.3)
Creatinine, Ser: 0.84 mg/dL (ref 0.61–1.24)
GFR calc Af Amer: >60 mL/min (ref >60)
GFR calc non Af Amer: >60 mL/min (ref >60)
GLUCOSE: 72 mg/dL (ref 65–99)
POTASSIUM: 4.3 mmol/L (ref 3.5–5.1)
Sodium: 141 mmol/L (ref 135–145)

## 2017-06-04 NOTE — ED Triage Notes (Signed)
Patient reports multiple complaints : Cold Sx for 1 month with productive cough , sneezing , and  rhinorrhea . Patient added headache form 2 weeks , denies head injury , no emesis or photophobia . Denies fever or chills .

## 2017-06-05 ENCOUNTER — Emergency Department (HOSPITAL_COMMUNITY)
Admission: EM | Admit: 2017-06-05 | Discharge: 2017-06-05 | Discharge status: Against Medical Advice | Payer: Self-pay | Attending: Emergency Medicine | Admitting: Emergency Medicine

## 2017-06-05 HISTORY — DX: Essential (primary) hypertension: I10

## 2017-06-05 HISTORY — DX: Migraine, unspecified, not intractable, without status migrainosus: G43.909

## 2017-08-26 ENCOUNTER — Encounter (HOSPITAL_COMMUNITY): Payer: Self-pay | Admitting: Emergency Medicine

## 2017-08-26 ENCOUNTER — Emergency Department (HOSPITAL_COMMUNITY): Payer: Self-pay

## 2017-08-26 ENCOUNTER — Observation Stay (HOSPITAL_COMMUNITY)
Admission: EM | Admit: 2017-08-26 | Discharge: 2017-08-27 | Disposition: A | Discharge status: Home / Self Care | Payer: Self-pay | Attending: General Surgery | Admitting: General Surgery

## 2017-08-26 DIAGNOSIS — F10120 Alcohol abuse with intoxication, uncomplicated: Secondary | ICD-10-CM | POA: Insufficient documentation

## 2017-08-26 DIAGNOSIS — S32020A Wedge compression fracture of second lumbar vertebra, initial encounter for closed fracture: Principal | ICD-10-CM | POA: Insufficient documentation

## 2017-08-26 DIAGNOSIS — F10920 Alcohol use, unspecified with intoxication, uncomplicated: Secondary | ICD-10-CM

## 2017-08-26 DIAGNOSIS — K219 Gastro-esophageal reflux disease without esophagitis: Secondary | ICD-10-CM | POA: Insufficient documentation

## 2017-08-26 DIAGNOSIS — I10 Essential (primary) hypertension: Secondary | ICD-10-CM | POA: Insufficient documentation

## 2017-08-26 DIAGNOSIS — K760 Fatty (change of) liver, not elsewhere classified: Secondary | ICD-10-CM | POA: Insufficient documentation

## 2017-08-26 DIAGNOSIS — R7989 Other specified abnormal findings of blood chemistry: Secondary | ICD-10-CM

## 2017-08-26 DIAGNOSIS — I700 Atherosclerosis of aorta: Secondary | ICD-10-CM | POA: Insufficient documentation

## 2017-08-26 DIAGNOSIS — S2231XA Fracture of one rib, right side, initial encounter for closed fracture: Secondary | ICD-10-CM | POA: Insufficient documentation

## 2017-08-26 DIAGNOSIS — E876 Hypokalemia: Secondary | ICD-10-CM | POA: Insufficient documentation

## 2017-08-26 DIAGNOSIS — Z79899 Other long term (current) drug therapy: Secondary | ICD-10-CM | POA: Insufficient documentation

## 2017-08-26 DIAGNOSIS — R0902 Hypoxemia: Secondary | ICD-10-CM

## 2017-08-26 DIAGNOSIS — W14XXXA Fall from tree, initial encounter: Secondary | ICD-10-CM | POA: Insufficient documentation

## 2017-08-26 DIAGNOSIS — F17200 Nicotine dependence, unspecified, uncomplicated: Secondary | ICD-10-CM | POA: Insufficient documentation

## 2017-08-26 DIAGNOSIS — R918 Other nonspecific abnormal finding of lung field: Secondary | ICD-10-CM | POA: Insufficient documentation

## 2017-08-26 LAB — COMPREHENSIVE METABOLIC PANEL
ALBUMIN: 3.6 g/dL (ref 3.5–5.0)
ALT: 88 U/L — ABNORMAL HIGH (ref 17–63)
ANION GAP: 11 (ref 5–15)
AST: 81 U/L — ABNORMAL HIGH (ref 15–41)
Alkaline Phosphatase: 58 U/L (ref 38–126)
BUN: 9 mg/dL (ref 6–20)
CHLORIDE: 105 mmol/L (ref 101–111)
CO2: 23 mmol/L (ref 22–32)
Calcium: 8.6 mg/dL — ABNORMAL LOW (ref 8.9–10.3)
Creatinine, Ser: 0.97 mg/dL (ref 0.61–1.24)
GFR calc non Af Amer: >60 mL/min (ref >60)
GLUCOSE: 155 mg/dL — AB (ref 65–99)
POTASSIUM: 3.3 mmol/L — AB (ref 3.5–5.1)
SODIUM: 139 mmol/L (ref 135–145)
Total Bilirubin: 0.7 mg/dL (ref 0.3–1.2)
Total Protein: 6.8 g/dL (ref 6.5–8.1)

## 2017-08-26 LAB — CBC
HEMATOCRIT: 47 % (ref 39.0–52.0)
HEMOGLOBIN: 16.6 g/dL (ref 13.0–17.0)
MCH: 34.7 pg — AB (ref 26.0–34.0)
MCHC: 35.3 g/dL (ref 30.0–36.0)
MCV: 98.3 fL (ref 78.0–100.0)
Platelets: 122 10*3/uL — ABNORMAL LOW (ref 150–400)
RBC: 4.78 MIL/uL (ref 4.22–5.81)
RDW: 13.3 % (ref 11.5–15.5)
WBC: 6.5 10*3/uL (ref 4.0–10.5)

## 2017-08-26 LAB — URINALYSIS, ROUTINE W REFLEX MICROSCOPIC
Bilirubin Urine: NEGATIVE
GLUCOSE, UA: NEGATIVE mg/dL
HGB URINE DIPSTICK: NEGATIVE
Ketones, ur: NEGATIVE mg/dL
LEUKOCYTES UA: NEGATIVE
Nitrite: NEGATIVE
PROTEIN: NEGATIVE mg/dL
SPECIFIC GRAVITY, URINE: 1.016 (ref 1.005–1.030)
pH: 5 (ref 5.0–8.0)

## 2017-08-26 LAB — SAMPLE TO BLOOD BANK

## 2017-08-26 LAB — I-STAT CHEM 8, ED
BUN: 8 mg/dL (ref 6–20)
CALCIUM ION: 1.04 mmol/L — AB (ref 1.15–1.40)
CHLORIDE: 105 mmol/L (ref 101–111)
Creatinine, Ser: 1.1 mg/dL (ref 0.61–1.24)
GLUCOSE: 148 mg/dL — AB (ref 65–99)
HCT: 46 % (ref 39.0–52.0)
HEMOGLOBIN: 15.6 g/dL (ref 13.0–17.0)
Potassium: 3.2 mmol/L — ABNORMAL LOW (ref 3.5–5.1)
Sodium: 140 mmol/L (ref 135–145)
TCO2: 21 mmol/L — AB (ref 22–32)

## 2017-08-26 LAB — ETHANOL: ALCOHOL ETHYL (B): 237 mg/dL — AB (ref <10)

## 2017-08-26 LAB — PROTIME-INR
INR: 0.95
PROTHROMBIN TIME: 12.6 s (ref 11.4–15.2)

## 2017-08-26 LAB — I-STAT CG4 LACTIC ACID, ED: LACTIC ACID, VENOUS: 2.25 mmol/L — AB (ref 0.5–1.9)

## 2017-08-26 LAB — CDS SEROLOGY

## 2017-08-26 LAB — HIV ANTIBODY (ROUTINE TESTING W REFLEX): HIV Screen 4th Generation wRfx: NONREACTIVE

## 2017-08-26 MED ORDER — HYDROMORPHONE HCL 2 MG/ML IJ SOLN
0.5000 mg | INTRAMUSCULAR | Status: DC | PRN
  Administered 2017-08-26 – 2017-08-27 (×4): 0.5 mg via INTRAVENOUS
  Filled 2017-08-26 (×4): qty 1

## 2017-08-26 MED ORDER — GABAPENTIN 300 MG PO CAPS
300.0000 mg | ORAL_CAPSULE | Freq: Three times a day (TID) | ORAL | Status: DC
  Administered 2017-08-26 – 2017-08-27 (×3): 300 mg via ORAL
  Filled 2017-08-26 (×5): qty 1

## 2017-08-26 MED ORDER — FOLIC ACID 1 MG PO TABS
1.0000 mg | ORAL_TABLET | Freq: Every day | ORAL | Status: DC
  Administered 2017-08-27: 1 mg via ORAL
  Filled 2017-08-26 (×3): qty 1

## 2017-08-26 MED ORDER — IOPAMIDOL (ISOVUE-370) INJECTION 76%: 100.0000 mL | Freq: Once | INTRAVENOUS | Status: DC | PRN

## 2017-08-26 MED ORDER — POTASSIUM CHLORIDE CRYS ER 20 MEQ PO TBCR
40.0000 meq | EXTENDED_RELEASE_TABLET | Freq: Once | ORAL | Status: AC
  Administered 2017-08-26: 40 meq via ORAL
  Filled 2017-08-26: qty 2

## 2017-08-26 MED ORDER — LORAZEPAM 2 MG/ML IJ SOLN
0.0000 mg | Freq: Four times a day (QID) | INTRAMUSCULAR | Status: DC
  Administered 2017-08-26: 1 mg via INTRAVENOUS
  Filled 2017-08-26: qty 1

## 2017-08-26 MED ORDER — OXYCODONE HCL 5 MG PO TABS
10.0000 mg | ORAL_TABLET | ORAL | Status: DC | PRN
  Administered 2017-08-26 – 2017-08-27 (×4): 10 mg via ORAL
  Filled 2017-08-26 (×4): qty 2

## 2017-08-26 MED ORDER — ONDANSETRON HCL 4 MG/2ML IJ SOLN
4.0000 mg | Freq: Once | INTRAMUSCULAR | Status: AC
  Administered 2017-08-26: 4 mg via INTRAVENOUS
  Filled 2017-08-26: qty 2

## 2017-08-26 MED ORDER — THIAMINE HCL 100 MG/ML IJ SOLN: 100.0000 mg | Freq: Every day | INTRAMUSCULAR | Status: DC

## 2017-08-26 MED ORDER — HYDRALAZINE HCL 20 MG/ML IJ SOLN
10.0000 mg | INTRAMUSCULAR | Status: DC | PRN
  Administered 2017-08-27: 10 mg via INTRAVENOUS
  Filled 2017-08-26: qty 1

## 2017-08-26 MED ORDER — ENOXAPARIN SODIUM 40 MG/0.4ML ~~LOC~~ SOLN
40.0000 mg | SUBCUTANEOUS | Status: DC
  Filled 2017-08-26: qty 0.4

## 2017-08-26 MED ORDER — IOPAMIDOL (ISOVUE-300) INJECTION 61%
INTRAVENOUS | Status: AC
  Filled 2017-08-26: qty 100

## 2017-08-26 MED ORDER — SODIUM CHLORIDE 0.9 % IV BOLUS
1000.0000 mL | Freq: Once | INTRAVENOUS | Status: AC
  Administered 2017-08-26: 1000 mL via INTRAVENOUS

## 2017-08-26 MED ORDER — LORAZEPAM 1 MG PO TABS: 1.0000 mg | ORAL_TABLET | Freq: Four times a day (QID) | ORAL | Status: DC | PRN

## 2017-08-26 MED ORDER — VITAMIN B-1 100 MG PO TABS
100.0000 mg | ORAL_TABLET | Freq: Every day | ORAL | Status: DC
  Administered 2017-08-26 – 2017-08-27 (×2): 100 mg via ORAL
  Filled 2017-08-26 (×3): qty 1

## 2017-08-26 MED ORDER — ONDANSETRON HCL 4 MG/2ML IJ SOLN
4.0000 mg | Freq: Four times a day (QID) | INTRAMUSCULAR | Status: DC | PRN
  Administered 2017-08-26 (×2): 4 mg via INTRAVENOUS
  Filled 2017-08-26 (×2): qty 2

## 2017-08-26 MED ORDER — METHOCARBAMOL 1000 MG/10ML IJ SOLN
500.0000 mg | Freq: Four times a day (QID) | INTRAVENOUS | Status: DC | PRN
  Filled 2017-08-26 (×2): qty 5

## 2017-08-26 MED ORDER — IOPAMIDOL (ISOVUE-300) INJECTION 61%
100.0000 mL | Freq: Once | INTRAVENOUS | Status: AC | PRN
  Administered 2017-08-26: 100 mL via INTRAVENOUS

## 2017-08-26 MED ORDER — METOPROLOL TARTRATE 5 MG/5ML IV SOLN
5.0000 mg | Freq: Four times a day (QID) | INTRAVENOUS | Status: DC | PRN
  Administered 2017-08-27: 5 mg via INTRAVENOUS

## 2017-08-26 MED ORDER — MORPHINE SULFATE (PF) 4 MG/ML IV SOLN
4.0000 mg | Freq: Once | INTRAVENOUS | Status: AC
  Administered 2017-08-26: 4 mg via INTRAVENOUS
  Filled 2017-08-26: qty 1

## 2017-08-26 MED ORDER — DOCUSATE SODIUM 100 MG PO CAPS
100.0000 mg | ORAL_CAPSULE | Freq: Two times a day (BID) | ORAL | Status: DC
  Filled 2017-08-26 (×4): qty 1

## 2017-08-26 MED ORDER — LORAZEPAM 2 MG/ML IJ SOLN: 1.0000 mg | Freq: Four times a day (QID) | INTRAMUSCULAR | Status: DC | PRN

## 2017-08-26 MED ORDER — ACETAMINOPHEN 325 MG PO TABS
650.0000 mg | ORAL_TABLET | ORAL | Status: DC | PRN
  Administered 2017-08-26 – 2017-08-27 (×3): 650 mg via ORAL
  Filled 2017-08-26 (×3): qty 2

## 2017-08-26 MED ORDER — ADULT MULTIVITAMIN W/MINERALS CH
1.0000 | ORAL_TABLET | Freq: Every day | ORAL | Status: DC
  Administered 2017-08-26 – 2017-08-27 (×2): 1 via ORAL
  Filled 2017-08-26 (×3): qty 1

## 2017-08-26 MED ORDER — OXYCODONE HCL 5 MG PO TABS
5.0000 mg | ORAL_TABLET | ORAL | Status: DC | PRN
  Administered 2017-08-26: 5 mg via ORAL
  Filled 2017-08-26 (×2): qty 1

## 2017-08-26 MED ORDER — NICOTINE 14 MG/24HR TD PT24
14.0000 mg | MEDICATED_PATCH | Freq: Every day | TRANSDERMAL | Status: DC
  Administered 2017-08-26 – 2017-08-27 (×2): 14 mg via TRANSDERMAL
  Filled 2017-08-26 (×2): qty 1

## 2017-08-26 MED ORDER — ONDANSETRON 4 MG PO TBDP: 4.0000 mg | ORAL_TABLET | Freq: Four times a day (QID) | ORAL | Status: DC | PRN

## 2017-08-26 MED ORDER — LORAZEPAM 2 MG/ML IJ SOLN: 0.0000 mg | Freq: Two times a day (BID) | INTRAMUSCULAR | Status: DC

## 2017-08-26 NOTE — ED Notes (Addendum)
Pt Oxygen dropped to 86, pt was woken up and told to take some deep breathes. Oxygen raised to 92. Pt placed on nasal cannula at 2L. MD notified.

## 2017-08-26 NOTE — Progress Notes (Signed)
Patients needs encouragement with his incentive spirometer.

## 2017-08-26 NOTE — Progress Notes (Signed)
Orthopedic Tech Progress Note Patient Details:  Kayen Grabel Apr 12, 1976 [id number]  Patient ID: Loma Messing, male   DOB: June 10, 1975, 42 y.o.   MRN: [id number]   Nikki Dom 08/26/2017, 10:30 AM Called in bio-tech brace order; spoke with answering service

## 2017-08-26 NOTE — Evaluation (Signed)
Physical Therapy Evaluation Patient Details Name: Patrick [name] MRN: [id number] DOB: 06-01-1975 Today's Date: 08/26/2017   History of Present Illness  Pt. is a 42 y.o. with significant PMH of HTN and alcohol use disorder who presents after fallen out of a tree stand. Found to have T12 compression fracture and 12th rib fracture.   Clinical Impression  Patient presenting with decreased functional mobility secondary to pain, diminished functional strength, and activity tolerance. Able to ambulate 50 feet with RW and supervision; mainly uses RW for pain control not balance. Educated extensively on movements to avoid with certain activities (I.e. Bending from waist, trunk rotation), demonstrated car transfer, back brace management, and core stabilization exercises for improved pain control. Will follow acutely to progress mobility. Recommending home with HHPT.    Follow Up Recommendations Home health PT    Equipment Recommendations  Rolling walker with 5" wheels;3in1 (PT)    Recommendations for Other Services       Precautions / Restrictions Precautions Precautions: Fall;Back Required Braces or Orthoses: Spinal Brace(When ambulating, only when OOB. Don in bed) Spinal Brace: Thoracolumbosacral orthotic Restrictions Weight Bearing Restrictions: No      Mobility  Bed Mobility Overal bed mobility: Modified Independent             General bed mobility comments: Needs reinforcement on log roll technique; patient impulsively sat straight up before explanation  Transfers Overall transfer level: Modified independent Equipment used: None;Rolling walker (2 wheeled)             General transfer comment: Modified independent with sit to stand with and without RW  Ambulation/Gait Ambulation/Gait assistance: Supervision Ambulation Distance (Feet): 50 Feet Assistive device: Rolling walker (2 wheeled) Gait Pattern/deviations: Step-through pattern;Decreased stride length     General  Gait Details: slow and steady gait speed using RW for pain control.   Stairs            Wheelchair Mobility    Modified Rankin (Stroke Patients Only)       Balance Overall balance assessment: No apparent balance deficits (not formally assessed)                                           Pertinent Vitals/Pain Pain Assessment: Faces Faces Pain Scale: Hurts even more Pain Location: Back Pain Descriptors / Indicators: Discomfort;Guarding Pain Intervention(s): Limited activity within patient's tolerance;Monitored during session;Patient requesting pain meds-RN notified    Home Living Family/patient expects to be discharged to:: Private residence Living Arrangements: Spouse/significant other Available Help at Discharge: Family;Friend(s) Type of Home: House Home Access: Level entry     Home Layout: Able to live on main level with bedroom/bathroom Home Equipment: None      Prior Function Level of Independence: Independent               Hand Dominance        Extremity/Trunk Assessment   Upper Extremity Assessment Upper Extremity Assessment: Overall WFL for tasks assessed    Lower Extremity Assessment Lower Extremity Assessment: Overall WFL for tasks assessed       Communication   Communication: No difficulties  Cognition Arousal/Alertness: Awake/alert Behavior During Therapy: WFL for tasks assessed/performed Overall Cognitive Status: Within Functional Limits for tasks assessed  General Comments General comments (skin integrity, edema, etc.): patient significant other present throughout.     Exercises Other Exercises Other Exercises: Instructed on transverse abdominus contraction for core stabilization   Assessment/Plan    PT Assessment Patient needs continued PT services  PT Problem List Decreased strength;Decreased activity tolerance;Decreased balance;Decreased  mobility;Decreased knowledge of use of DME;Pain       PT Treatment Interventions DME instruction;Gait training;Functional mobility training;Stair training;Therapeutic activities;Therapeutic exercise;Balance training;Patient/family education    PT Goals (Current goals can be found in the Care Plan section)  Acute Rehab PT Goals Patient Stated Goal: Decrease pain PT Goal Formulation: With patient Time For Goal Achievement: 09/09/17 Potential to Achieve Goals: Good Additional Goals Additional Goal #1: Patient will don and doff brace independently    Frequency Min 5X/week   Barriers to discharge        Co-evaluation               AM-PAC PT "6 Clicks" Daily Activity  Outcome Measure Difficulty turning over in bed (including adjusting bedclothes, sheets and blankets)?: None Difficulty moving from lying on back to sitting on the side of the bed? : None Difficulty sitting down on and standing up from a chair with arms (e.g., wheelchair, bedside commode, etc,.)?: None Help needed moving to and from a bed to chair (including a wheelchair)?: A Little Help needed walking in hospital room?: A Little Help needed climbing 3-5 steps with a railing? : A Little 6 Click Score: 21    End of Session Equipment Utilized During Treatment: Back brace Activity Tolerance: Patient limited by pain Patient left: in chair;with call bell/phone within reach;with family/visitor present Nurse Communication: Mobility status PT Visit Diagnosis: Pain;Difficulty in walking, not elsewhere classified (R26.2) Pain - part of body: (back)    Time: 1610-9604 PT Time Calculation (min) (ACUTE ONLY): 28 min   Charges:   PT Evaluation $PT Eval Low Complexity: 1 Low PT Treatments $Self Care/Home Management: 8-22   PT G Codes:        Laurina Bustle, PT, DPT Acute Rehabilitation Services  Pager: [phone]   Vanetta Mulders 08/26/2017, 5:39 PM

## 2017-08-26 NOTE — Consult Note (Signed)
I have reviewed the patient's CT of the chest abdomen and pelvis only as it pertains to his spine.  He has a mild L2 compression fracture.  This should heal fine in the TLSO.  Please have the patient follow-up with me in the office in a few weeks for follow-up x-ray.  He should wear his TLSO when the head of bed is greater than 45 degrees and don it in bed.  He can take it off to shower but should not bend or twist.  Please call that I can be of further assistance.

## 2017-08-26 NOTE — H&P (Addendum)
Surgical H&P  CC: fall  HPI: 42 year old with history of hypertension and alcohol use disorder who presents via private vehicle and triaged as a level 2 trauma alert few hours ago having fallen out of a tree stand approximately 20 feet off the ground.  He landed on some grass and thinks he landed on his feet first and then rolled onto his back.  Complains of pain in his lower back.  Positive loss of consciousness.  He does endorse alcohol consumption earlier in the evening. Imaging workup as below reveals an L2 compression fracture and a 12th rib facture.   Allergies  Allergen Reactions  . Mushroom Extract Complex Diarrhea and Other (See Comments)    Itchy throat    Past Medical History:  Diagnosis Date  . Eczema   . Hypertension   . Migraine headache     History reviewed. No pertinent surgical history.  No family history on file.  Social History   Socioeconomic History  . Marital status: Single    Spouse name: Not on file  . Number of children: Not on file  . Years of education: Not on file  . Highest education level: Not on file  Occupational History  . Not on file  Social Needs  . Financial resource strain: Not on file  . Food insecurity:    Worry: Not on file    Inability: Not on file  . Transportation needs:    Medical: Not on file    Non-medical: Not on file  Tobacco Use  . Smoking status: Current Every Day Smoker  . Smokeless tobacco: Never Used  Substance and Sexual Activity  . Alcohol use: Yes  . Drug use: Yes    Types: Marijuana  . Sexual activity: Not on file  Lifestyle  . Physical activity:    Days per week: Not on file    Minutes per session: Not on file  . Stress: Not on file  Relationships  . Social connections:    Talks on phone: Not on file    Gets together: Not on file    Attends religious service: Not on file    Active member of club or organization: Not on file    Attends meetings of clubs or organizations: Not on file    Relationship  status: Not on file  Other Topics Concern  . Not on file  Social History Narrative  . Not on file    No current facility-administered medications on file prior to encounter.    Current Outpatient Medications on File Prior to Encounter  Medication Sig Dispense Refill  . Multiple Vitamin (MULTIVITAMIN WITH MINERALS) TABS Take 1 tablet by mouth at bedtime.       Review of Systems: a complete, 10pt review of systems was completed with pertinent positives and negatives as documented in the HPI  Physical Exam: Vitals:   08/26/17 0445 08/26/17 0500  BP: (!) 126/102 (!) 131/93  Pulse: 90 86  Resp: 19 20  Temp:    SpO2: 98% 97%   Gen: A&Ox3, no distress  Head: normocephalic, atraumatic Eyes: extraocular motions intact, anicteric.  Neck: c-collar in place, no midline tenderness Chest: unlabored respirations, symmetrical air entry, clear bilaterally . On initial evaluation he is sleeping/snoring and his sats are 98%.  Cardiovascular: RRR with palpable distal pulses, no pedal edema Abdomen: soft, nondistended, nontender. No mass or organomegaly.  Back is tender in the upper lumbar area without step-off or deformity Extremities: warm, without edema, no deformities  Neuro:  grossly intact Psych: appropriate mood and affect, normal insight  Skin: warm and dry   CBC Latest Ref Rng & Units 08/26/2017 08/26/2017 06/04/2017  WBC 4.0 - 10.5 K/uL - 6.5 8.6  Hemoglobin 13.0 - 17.0 g/dL 16.1 09.6 04.5  Hematocrit 39.0 - 52.0 % 46.0 47.0 48.2  Platelets 150 - 400 K/uL - 122(L) 145(L)    CMP Latest Ref Rng & Units 08/26/2017 08/26/2017 06/04/2017  Glucose 65 - 99 mg/dL 409(W) 119(J) 72  BUN 6 - 20 mg/dL Creatinine 0.61 - 1.24 mg/dL 4.78 2.95 6.21  Sodium 135 - 145 mmol/L 140 139 141  Potassium 3.5 - 5.1 mmol/L 3.2(L) 3.3(L) 4.3  Chloride 101 - 111 mmol/L 105 105 105  CO2 22 - 32 mmol/L - 23 20(L)  Calcium 8.9 - 10.3 mg/dL - 8.6(L) 9.4  Total Protein 6.5 - 8.1 g/dL - 6.8 -  Total  Bilirubin 0.3 - 1.2 mg/dL - 0.7 -  Alkaline Phos 38 - 126 U/L - 58 -  AST 15 - 41 U/L - 81(H) -  ALT 17 - 63 U/L - 88(H) -    Lab Results  Component Value Date   INR 0.95 08/26/2017    Imaging: Ct Head Wo Contrast  Result Date: 08/26/2017 CLINICAL DATA:  Patient fell from a tree stand 20 feet. Unsure loss of consciousness. EXAM: CT HEAD WITHOUT CONTRAST CT CERVICAL SPINE WITHOUT CONTRAST TECHNIQUE: Multidetector CT imaging of the head and cervical spine was performed following the standard protocol without intravenous contrast. Multiplanar CT image reconstructions of the cervical spine were also generated. COMPARISON:  None. FINDINGS: CT HEAD FINDINGS Brain: No evidence of acute infarction, hemorrhage, hydrocephalus, extra-axial collection or mass lesion/mass effect. Vascular: No hyperdense vessel or unexpected calcification. Skull: Normal. Negative for fracture or focal lesion. Sinuses/Orbits: Retention cysts in the maxillary antra. No acute air-fluid levels. Mastoid air cells are not opacified. Other: None. CT CERVICAL SPINE FINDINGS Alignment: Normal. Skull base and vertebrae: No acute fracture. No primary bone lesion or focal pathologic process. Soft tissues and spinal canal: No prevertebral fluid or swelling. No visible canal hematoma. Disc levels:  Intervertebral disc space heights are preserved. Upper chest: Infiltration or edema in the lung apices. Other: None. IMPRESSION: 1. No acute intracranial abnormalities. 2. Normal alignment of the cervical spine. No acute displaced fractures identified. Electronically Signed   By: Burman Nieves M.D.   On: 08/26/2017 02:41   Ct Chest W Contrast  Result Date: 08/26/2017 CLINICAL DATA:  Patient fell 20 feet from a tree stand. Landed on the back. EXAM: CT CHEST, ABDOMEN, AND PELVIS WITH CONTRAST TECHNIQUE: Multidetector CT imaging of the chest, abdomen and pelvis was performed following the standard protocol during bolus administration of intravenous  contrast. CONTRAST:  ISOVUE-300 IOPAMIDOL (ISOVUE-300) INJECTION 61% COMPARISON:  None. FINDINGS: CT CHEST FINDINGS Cardiovascular: No significant vascular findings. Normal heart size. No pericardial effusion. Coronary artery calcifications. Mediastinum/Nodes: No enlarged mediastinal, hilar, or axillary lymph nodes. Thyroid gland, trachea, and esophagus demonstrate no significant findings. Lungs/Pleura: Increased density in the posterior lungs bilaterally. This could represent edema, contusion, or atelectasis. No focal consolidation. No pleural effusions. No pneumothorax. Airways are patent. Musculoskeletal: Displaced fracture of the right twelfth rib. Ribs and thoracic spine appear otherwise intact. Sternum is not depressed. CT ABDOMEN PELVIS FINDINGS Hepatobiliary: Diffuse fatty infiltration of the liver. No focal liver lesions identified. Gallbladder and bile ducts are unremarkable. Pancreas: Unremarkable. No pancreatic ductal dilatation or surrounding inflammatory changes. Spleen: No splenic  injury or perisplenic hematoma. Adrenals/Urinary Tract: No adrenal hemorrhage or renal injury identified. Bladder is unremarkable. Stomach/Bowel: Stomach is within normal limits. Appendix appears normal. No evidence of bowel wall thickening, distention, or inflammatory changes. Vascular/Lymphatic: Aortic atherosclerosis. No enlarged abdominal or pelvic lymph nodes. Reproductive: Prostate is unremarkable. Other: No abdominal wall hernia or abnormality. No abdominopelvic ascites. Musculoskeletal: There is an acute compression fracture of the L2 vertebral body mostly involving the superior endplate. Mild retropulsion of fracture fragments. Pedicles and posterior elements are not involved. Lumbar vertebrae, sacrum, pelvis, and hips appear otherwise intact. IMPRESSION: 1. Acute compression fracture of the L2 vertebral body with mild retropulsion of fracture fragment. 2. Acute fracture right twelfth rib. 3. No acute  posttraumatic changes demonstrated in the mediastinum. 4. Posterior infiltrates in the lungs may represent edema, contusion, or atelectasis. No pneumothorax. 5. No evidence of solid organ injury or bowel perforation in the abdomen or pelvis. 6. Diffuse fatty infiltration of the liver. 7. Aortic atherosclerosis. These results were called by telephone at the time of interpretation on 08/26/2017 at 2:49 am to Dr. Dione Booze , who verbally acknowledged these results. Electronically Signed   By: Burman Nieves M.D.   On: 08/26/2017 02:51   Ct Cervical Spine Wo Contrast  Result Date: 08/26/2017 CLINICAL DATA:  Patient fell from a tree stand 20 feet. Unsure loss of consciousness. EXAM: CT HEAD WITHOUT CONTRAST CT CERVICAL SPINE WITHOUT CONTRAST TECHNIQUE: Multidetector CT imaging of the head and cervical spine was performed following the standard protocol without intravenous contrast. Multiplanar CT image reconstructions of the cervical spine were also generated. COMPARISON:  None. FINDINGS: CT HEAD FINDINGS Brain: No evidence of acute infarction, hemorrhage, hydrocephalus, extra-axial collection or mass lesion/mass effect. Vascular: No hyperdense vessel or unexpected calcification. Skull: Normal. Negative for fracture or focal lesion. Sinuses/Orbits: Retention cysts in the maxillary antra. No acute air-fluid levels. Mastoid air cells are not opacified. Other: None. CT CERVICAL SPINE FINDINGS Alignment: Normal. Skull base and vertebrae: No acute fracture. No primary bone lesion or focal pathologic process. Soft tissues and spinal canal: No prevertebral fluid or swelling. No visible canal hematoma. Disc levels:  Intervertebral disc space heights are preserved. Upper chest: Infiltration or edema in the lung apices. Other: None. IMPRESSION: 1. No acute intracranial abnormalities. 2. Normal alignment of the cervical spine. No acute displaced fractures identified. Electronically Signed   By: Burman Nieves M.D.   On:  08/26/2017 02:41   Ct Abdomen Pelvis W Contrast  Result Date: 08/26/2017 CLINICAL DATA:  Patient fell 20 feet from a tree stand. Landed on the back. EXAM: CT CHEST, ABDOMEN, AND PELVIS WITH CONTRAST TECHNIQUE: Multidetector CT imaging of the chest, abdomen and pelvis was performed following the standard protocol during bolus administration of intravenous contrast. CONTRAST:  ISOVUE-300 IOPAMIDOL (ISOVUE-300) INJECTION 61% COMPARISON:  None. FINDINGS: CT CHEST FINDINGS Cardiovascular: No significant vascular findings. Normal heart size. No pericardial effusion. Coronary artery calcifications. Mediastinum/Nodes: No enlarged mediastinal, hilar, or axillary lymph nodes. Thyroid gland, trachea, and esophagus demonstrate no significant findings. Lungs/Pleura: Increased density in the posterior lungs bilaterally. This could represent edema, contusion, or atelectasis. No focal consolidation. No pleural effusions. No pneumothorax. Airways are patent. Musculoskeletal: Displaced fracture of the right twelfth rib. Ribs and thoracic spine appear otherwise intact. Sternum is not depressed. CT ABDOMEN PELVIS FINDINGS Hepatobiliary: Diffuse fatty infiltration of the liver. No focal liver lesions identified. Gallbladder and bile ducts are unremarkable. Pancreas: Unremarkable. No pancreatic ductal dilatation or surrounding inflammatory changes. Spleen: No splenic  injury or perisplenic hematoma. Adrenals/Urinary Tract: No adrenal hemorrhage or renal injury identified. Bladder is unremarkable. Stomach/Bowel: Stomach is within normal limits. Appendix appears normal. No evidence of bowel wall thickening, distention, or inflammatory changes. Vascular/Lymphatic: Aortic atherosclerosis. No enlarged abdominal or pelvic lymph nodes. Reproductive: Prostate is unremarkable. Other: No abdominal wall hernia or abnormality. No abdominopelvic ascites. Musculoskeletal: There is an acute compression fracture of the L2 vertebral body mostly  involving the superior endplate. Mild retropulsion of fracture fragments. Pedicles and posterior elements are not involved. Lumbar vertebrae, sacrum, pelvis, and hips appear otherwise intact. IMPRESSION: 1. Acute compression fracture of the L2 vertebral body with mild retropulsion of fracture fragment. 2. Acute fracture right twelfth rib. 3. No acute posttraumatic changes demonstrated in the mediastinum. 4. Posterior infiltrates in the lungs may represent edema, contusion, or atelectasis. No pneumothorax. 5. No evidence of solid organ injury or bowel perforation in the abdomen or pelvis. 6. Diffuse fatty infiltration of the liver. 7. Aortic atherosclerosis. These results were called by telephone at the time of interpretation on 08/26/2017 at 2:49 am to Dr. Dione Booze , who verbally acknowledged these results. Electronically Signed   By: Burman Nieves M.D.   On: 08/26/2017 02:51   Dg Pelvis Portable  Result Date: 08/26/2017 CLINICAL DATA:  Patient fell from a tree stand about 20 feet. EXAM: PORTABLE PELVIS 1-2 VIEWS COMPARISON:  None. FINDINGS: There is no evidence of pelvic fracture or diastasis. No pelvic bone lesions are seen. IMPRESSION: Negative. Electronically Signed   By: Burman Nieves M.D.   On: 08/26/2017 01:54   Dg Chest Port 1 View  Result Date: 08/26/2017 CLINICAL DATA:  Patient fell from a tree stand about 20 feet. Alcohol use. Unsure loss of consciousness. EXAM: PORTABLE CHEST 1 VIEW COMPARISON:  05/26/2016 FINDINGS: Shallow inspiration. Normal heart size and pulmonary vascularity. No focal airspace disease or consolidation in the lungs. No blunting of costophrenic angles. No pneumothorax. Mediastinal contours appear intact. IMPRESSION: No active disease. Electronically Signed   By: Burman Nieves M.D.   On: 08/26/2017 01:52     A/P: 42 year old gentleman status post 20 foot fall.  CT head through pelvis demonstrates L2 vertebral body compression fracture with mild retropulsion as  well as a right 12th rib fracture.  Pain control has been challenging while in the emergency room and patient has desaturated a few times after falling asleep.  Trauma has been requested to admit the patient for observation. At the time of this consultation, his pain is better controlled and it seems that his sats are better. Will admit for obs   Phylliss Blakes, MD [facility], [place] Pager [phone]

## 2017-08-26 NOTE — ED Notes (Signed)
Pt admits to drinking etoh daily "it varies" per amount-- will not give definite answer-- will call trauma admit for CIWA protocal orders

## 2017-08-26 NOTE — ED Notes (Signed)
Trauma Doc and ED Provider @ Bedside

## 2017-08-26 NOTE — ED Provider Notes (Signed)
[facility] EMERGENCY DEPARTMENT Provider Note   CSN: [id number] Arrival date & time: 08/26/17  0112     History   Chief Complaint Chief Complaint  Patient presents with  . Fall    HPI Patrick [name] is a 42 y.o. male.  The history is provided by the patient.  He has history of hypertension and alcohol use disorder and comes in having fallen out of a tree stand.  He fell approximately 20 feet landing on some grass.  He thinks he landed on his feet first and then rolled onto his back.  Is complaining of pain in his lower back.  He thinks he may have been loss of consciousness.  He does admit to having consumed alcohol earlier in the evening.  He rates his pain at 10/10.  Past Medical History:  Diagnosis Date  . Eczema   . Hypertension   . Migraine headache     Patient Active Problem List   Diagnosis Date Noted  . Behavior change due to substance use 02/27/2017  . Alcohol use disorder, severe, dependence (HCC) 02/27/2017    History reviewed. No pertinent surgical history.      Home Medications    Prior to Admission medications   Medication Sig Start Date End Date Taking? Authorizing Provider  Multiple Vitamin (MULTIVITAMIN WITH MINERALS) TABS Take 1 tablet by mouth at bedtime.     [provider]    Family History No family history on file.  Social History Social History   Tobacco Use  . Smoking status: Current Every Day Smoker  . Smokeless tobacco: Never Used  Substance Use Topics  . Alcohol use: Yes  . Drug use: Yes    Types: Marijuana     Allergies   Mushroom extract complex   Review of Systems Review of Systems  All other systems reviewed and are negative.    Physical Exam Updated Vital Signs BP (!) 139/99   Pulse 94   Temp 97.7 F (36.5 C)   Resp (!) 22   Ht  (1.803 m)   Wt 81.6 kg (180 lb)   SpO2 94%   BMI 25.10 kg/m   Physical Exam  Nursing note and vitals reviewed.  42 year old male, in pain,  but in no acute distress. Vital signs are significant for elevated blood pressure and mild tachypnea. Oxygen saturation is 94%, which is normal. Head is normocephalic and atraumatic. PERRLA, EOMI. Oropharynx is clear. Neck is immobilized in a stiff cervical collar and is noted to be mildly tender without adenopathy or JVD. Back is tender in the mid and upper lumbar area.  There is no CVA tenderness. Lungs are clear without rales, wheezes, or rhonchi. Chest has mild tenderness diffusely without crepitus. Heart has regular rate and rhythm without murmur. Abdomen is soft, flat, with mild tenderness diffusely.  There is no rebound or guarding.  There are no masses or hepatosplenomegaly and peristalsis is hypoactive.  Pelvis is stable.  Mild abrasion is noted in the left pelvis postero-laterally. Extremities have no cyanosis or edema, full range of motion is present.  There is mild tenderness to palpation over both hips.  Range of motion of the hips causes pain in the back, also pain in the left hip with passive range of motion. Skin is warm and dry without rash. Neurologic: Mental status is normal, cranial nerves are intact, there are no motor or sensory deficits.  ED Treatments / Results  Labs (all labs ordered are  listed, but only abnormal results are displayed) Labs Reviewed  COMPREHENSIVE METABOLIC PANEL - Abnormal; Notable for the following components:      Result Value   Potassium 3.3 (*)    Glucose, Bld 155 (*)    Calcium 8.6 (*)    AST 81 (*)    ALT 88 (*)    All other components within normal limits  CBC - Abnormal; Notable for the following components:   MCH 34.7 (*)    Platelets 122 (*)    All other components within normal limits  ETHANOL - Abnormal; Notable for the following components:   Alcohol, Ethyl (B) 237 (*)    All other components within normal limits  I-STAT CHEM 8, ED - Abnormal; Notable for the following components:   Potassium 3.2 (*)    Glucose, Bld 148 (*)     Calcium, Ion 1.04 (*)    TCO2 21 (*)    All other components within normal limits  I-STAT CG4 LACTIC ACID, ED - Abnormal; Notable for the following components:   Lactic Acid, Venous 2.25 (*)    All other components within normal limits  CDS SEROLOGY  URINALYSIS, ROUTINE W REFLEX MICROSCOPIC  PROTIME-INR  SAMPLE TO BLOOD BANK   Radiology Ct Head Wo Contrast  Result Date: 08/26/2017 CLINICAL DATA:  Patient fell from a tree stand 20 feet. Unsure loss of consciousness. EXAM: CT HEAD WITHOUT CONTRAST CT CERVICAL SPINE WITHOUT CONTRAST TECHNIQUE: Multidetector CT imaging of the head and cervical spine was performed following the standard protocol without intravenous contrast. Multiplanar CT image reconstructions of the cervical spine were also generated. COMPARISON:  None. FINDINGS: CT HEAD FINDINGS Brain: No evidence of acute infarction, hemorrhage, hydrocephalus, extra-axial collection or mass lesion/mass effect. Vascular: No hyperdense vessel or unexpected calcification. Skull: Normal. Negative for fracture or focal lesion. Sinuses/Orbits: Retention cysts in the maxillary antra. No acute air-fluid levels. Mastoid air cells are not opacified. Other: None. CT CERVICAL SPINE FINDINGS Alignment: Normal. Skull base and vertebrae: No acute fracture. No primary bone lesion or focal pathologic process. Soft tissues and spinal canal: No prevertebral fluid or swelling. No visible canal hematoma. Disc levels:  Intervertebral disc space heights are preserved. Upper chest: Infiltration or edema in the lung apices. Other: None. IMPRESSION: 1. No acute intracranial abnormalities. 2. Normal alignment of the cervical spine. No acute displaced fractures identified. Electronically Signed   By: Burman Nieves M.D.   On: 08/26/2017 02:41   Ct Chest W Contrast  Result Date: 08/26/2017 CLINICAL DATA:  Patient fell 20 feet from a tree stand. Landed on the back. EXAM: CT CHEST, ABDOMEN, AND PELVIS WITH CONTRAST TECHNIQUE:  Multidetector CT imaging of the chest, abdomen and pelvis was performed following the standard protocol during bolus administration of intravenous contrast. CONTRAST:  ISOVUE-300 IOPAMIDOL (ISOVUE-300) INJECTION 61% COMPARISON:  None. FINDINGS: CT CHEST FINDINGS Cardiovascular: No significant vascular findings. Normal heart size. No pericardial effusion. Coronary artery calcifications. Mediastinum/Nodes: No enlarged mediastinal, hilar, or axillary lymph nodes. Thyroid gland, trachea, and esophagus demonstrate no significant findings. Lungs/Pleura: Increased density in the posterior lungs bilaterally. This could represent edema, contusion, or atelectasis. No focal consolidation. No pleural effusions. No pneumothorax. Airways are patent. Musculoskeletal: Displaced fracture of the right twelfth rib. Ribs and thoracic spine appear otherwise intact. Sternum is not depressed. CT ABDOMEN PELVIS FINDINGS Hepatobiliary: Diffuse fatty infiltration of the liver. No focal liver lesions identified. Gallbladder and bile ducts are unremarkable. Pancreas: Unremarkable. No pancreatic ductal dilatation or surrounding inflammatory changes.  Spleen: No splenic injury or perisplenic hematoma. Adrenals/Urinary Tract: No adrenal hemorrhage or renal injury identified. Bladder is unremarkable. Stomach/Bowel: Stomach is within normal limits. Appendix appears normal. No evidence of bowel wall thickening, distention, or inflammatory changes. Vascular/Lymphatic: Aortic atherosclerosis. No enlarged abdominal or pelvic lymph nodes. Reproductive: Prostate is unremarkable. Other: No abdominal wall hernia or abnormality. No abdominopelvic ascites. Musculoskeletal: There is an acute compression fracture of the L2 vertebral body mostly involving the superior endplate. Mild retropulsion of fracture fragments. Pedicles and posterior elements are not involved. Lumbar vertebrae, sacrum, pelvis, and hips appear otherwise intact. IMPRESSION: 1. Acute  compression fracture of the L2 vertebral body with mild retropulsion of fracture fragment. 2. Acute fracture right twelfth rib. 3. No acute posttraumatic changes demonstrated in the mediastinum. 4. Posterior infiltrates in the lungs may represent edema, contusion, or atelectasis. No pneumothorax. 5. No evidence of solid organ injury or bowel perforation in the abdomen or pelvis. 6. Diffuse fatty infiltration of the liver. 7. Aortic atherosclerosis. These results were called by telephone at the time of interpretation on 08/26/2017 at 2:49 am to Dr. Dione Booze , who verbally acknowledged these results. Electronically Signed   By: Burman Nieves M.D.   On: 08/26/2017 02:51   Ct Cervical Spine Wo Contrast  Result Date: 08/26/2017 CLINICAL DATA:  Patient fell from a tree stand 20 feet. Unsure loss of consciousness. EXAM: CT HEAD WITHOUT CONTRAST CT CERVICAL SPINE WITHOUT CONTRAST TECHNIQUE: Multidetector CT imaging of the head and cervical spine was performed following the standard protocol without intravenous contrast. Multiplanar CT image reconstructions of the cervical spine were also generated. COMPARISON:  None. FINDINGS: CT HEAD FINDINGS Brain: No evidence of acute infarction, hemorrhage, hydrocephalus, extra-axial collection or mass lesion/mass effect. Vascular: No hyperdense vessel or unexpected calcification. Skull: Normal. Negative for fracture or focal lesion. Sinuses/Orbits: Retention cysts in the maxillary antra. No acute air-fluid levels. Mastoid air cells are not opacified. Other: None. CT CERVICAL SPINE FINDINGS Alignment: Normal. Skull base and vertebrae: No acute fracture. No primary bone lesion or focal pathologic process. Soft tissues and spinal canal: No prevertebral fluid or swelling. No visible canal hematoma. Disc levels:  Intervertebral disc space heights are preserved. Upper chest: Infiltration or edema in the lung apices. Other: None. IMPRESSION: 1. No acute intracranial abnormalities. 2.  Normal alignment of the cervical spine. No acute displaced fractures identified. Electronically Signed   By: Burman Nieves M.D.   On: 08/26/2017 02:41   Ct Abdomen Pelvis W Contrast  Result Date: 08/26/2017 CLINICAL DATA:  Patient fell 20 feet from a tree stand. Landed on the back. EXAM: CT CHEST, ABDOMEN, AND PELVIS WITH CONTRAST TECHNIQUE: Multidetector CT imaging of the chest, abdomen and pelvis was performed following the standard protocol during bolus administration of intravenous contrast. CONTRAST:  ISOVUE-300 IOPAMIDOL (ISOVUE-300) INJECTION 61% COMPARISON:  None. FINDINGS: CT CHEST FINDINGS Cardiovascular: No significant vascular findings. Normal heart size. No pericardial effusion. Coronary artery calcifications. Mediastinum/Nodes: No enlarged mediastinal, hilar, or axillary lymph nodes. Thyroid gland, trachea, and esophagus demonstrate no significant findings. Lungs/Pleura: Increased density in the posterior lungs bilaterally. This could represent edema, contusion, or atelectasis. No focal consolidation. No pleural effusions. No pneumothorax. Airways are patent. Musculoskeletal: Displaced fracture of the right twelfth rib. Ribs and thoracic spine appear otherwise intact. Sternum is not depressed. CT ABDOMEN PELVIS FINDINGS Hepatobiliary: Diffuse fatty infiltration of the liver. No focal liver lesions identified. Gallbladder and bile ducts are unremarkable. Pancreas: Unremarkable. No pancreatic ductal dilatation or surrounding inflammatory changes.  Spleen: No splenic injury or perisplenic hematoma. Adrenals/Urinary Tract: No adrenal hemorrhage or renal injury identified. Bladder is unremarkable. Stomach/Bowel: Stomach is within normal limits. Appendix appears normal. No evidence of bowel wall thickening, distention, or inflammatory changes. Vascular/Lymphatic: Aortic atherosclerosis. No enlarged abdominal or pelvic lymph nodes. Reproductive: Prostate is unremarkable. Other: No abdominal wall  hernia or abnormality. No abdominopelvic ascites. Musculoskeletal: There is an acute compression fracture of the L2 vertebral body mostly involving the superior endplate. Mild retropulsion of fracture fragments. Pedicles and posterior elements are not involved. Lumbar vertebrae, sacrum, pelvis, and hips appear otherwise intact. IMPRESSION: 1. Acute compression fracture of the L2 vertebral body with mild retropulsion of fracture fragment. 2. Acute fracture right twelfth rib. 3. No acute posttraumatic changes demonstrated in the mediastinum. 4. Posterior infiltrates in the lungs may represent edema, contusion, or atelectasis. No pneumothorax. 5. No evidence of solid organ injury or bowel perforation in the abdomen or pelvis. 6. Diffuse fatty infiltration of the liver. 7. Aortic atherosclerosis. These results were called by telephone at the time of interpretation on 08/26/2017 at 2:49 am to Dr. Dione Booze , who verbally acknowledged these results. Electronically Signed   By: Burman Nieves M.D.   On: 08/26/2017 02:51   Dg Pelvis Portable  Result Date: 08/26/2017 CLINICAL DATA:  Patient fell from a tree stand about 20 feet. EXAM: PORTABLE PELVIS 1-2 VIEWS COMPARISON:  None. FINDINGS: There is no evidence of pelvic fracture or diastasis. No pelvic bone lesions are seen. IMPRESSION: Negative. Electronically Signed   By: Burman Nieves M.D.   On: 08/26/2017 01:54   Dg Chest Port 1 View  Result Date: 08/26/2017 CLINICAL DATA:  Patient fell from a tree stand about 20 feet. Alcohol use. Unsure loss of consciousness. EXAM: PORTABLE CHEST 1 VIEW COMPARISON:  05/26/2016 FINDINGS: Shallow inspiration. Normal heart size and pulmonary vascularity. No focal airspace disease or consolidation in the lungs. No blunting of costophrenic angles. No pneumothorax. Mediastinal contours appear intact. IMPRESSION: No active disease. Electronically Signed   By: Burman Nieves M.D.   On: 08/26/2017 01:52     Procedures Procedures  CRITICAL CARE Performed by: Dione Booze Total critical care time: 50 minutes Critical care time was exclusive of separately billable procedures and treating other patients. Critical care was necessary to treat or prevent imminent or life-threatening deterioration. Critical care was time spent personally by me on the following activities: development of treatment plan with patient and/or surrogate as well as nursing, discussions with consultants, evaluation of patient's response to treatment, examination of patient, obtaining history from patient or surrogate, ordering and performing treatments and interventions, ordering and review of laboratory studies, ordering and review of radiographic studies, pulse oximetry and re-evaluation of patient's condition.  Medications Ordered in ED Medications  iopamidol (ISOVUE-300) 61 % injection (has no administration in time range)  iopamidol (ISOVUE-370) 76 % injection 100 mL (has no administration in time range)  ondansetron (ZOFRAN) injection 4 mg (4 mg Intravenous Given 08/26/17 0137)  morphine 4 MG/ML injection 4 mg (4 mg Intravenous Given 08/26/17 0138)  sodium chloride 0.9 % bolus 1,000 mL (0 mLs Intravenous Stopped 08/26/17 0404)  potassium chloride SA (K-DUR,KLOR-CON) CR tablet 40 mEq (40 mEq Oral Given 08/26/17 0412)  iopamidol (ISOVUE-300) 61 % injection 100 mL (100 mLs Intravenous Contrast Given 08/26/17 0156)  morphine 4 MG/ML injection 4 mg (4 mg Intravenous Given 08/26/17 0243)     Initial Impression / Assessment and Plan / ED Course  I have reviewed the triage  vital signs and the nursing notes.  Pertinent labs & imaging results that were available during my care of the patient were reviewed by me and considered in my medical decision making (see chart for details).  Blunt trauma from fall 2020 feet.  Patient was designated a level 2 trauma on arrival.  He is given morphine for pain.  Initial portable chest x-ray  and pelvis x-ray are unremarkable per my reading with radiology interpretation pending.  Because of rather diffuse tenderness and with loss of consciousness he will have CT scans obtained of the head, cervical spine, chest, abdomen, pelvis.  Initial lactic acid level is come back slightly elevated and he is given some IV fluids.  This elevated lactic acid level is due to trauma and not due to sepsis.  Also, incidental finding of mild hypokalemia and he will be given some oral potassium.  Old records are reviewed, and he does have a behavioral health hospital admission last October for behavior change due to substance abuse.  CT scans show compression fracture of L2, and fracture of right 12th rib with no other obvious injuries seen.  I have  personally discussed the CT findingswith radiologist.  He was given a dose of morphine for pain, but continued to complain of pain.  However, he was noted to have significant oxygen desaturation with oxygen saturations dropping to 85%.  He is placed on nasal oxygen.  He is observed in the ED, but continued to desaturate whenever taken off of nasal oxygen.  He is noted to be sleeping and snoring and I suspect that most of this is related to obstructive sleep apnea.  However, it is felt prudent to observe him.  Case is discussed with Dr. Fredricka Bonine of trauma surgery service who agrees to come to evaluate the patient for possible admission.  Final Clinical Impressions(s) / ED Diagnoses   Final diagnoses:  Fall from tree, initial encounter  Closed compression fracture of L2 lumbar vertebra, initial encounter (HCC)  Closed fracture of one rib of right side, initial encounter  Alcohol intoxication, uncomplicated (HCC)  Elevated lactic acid level  Hypokalemia  Hypoxia    ED Discharge Orders    None       Dione Booze, MD 08/26/17 [phone]

## 2017-08-26 NOTE — ED Triage Notes (Signed)
Pt arrived via car, pt states just prior to arrival pt fell from tree stand @ 20 ft, pt states he called friend who helped him get out of the woods. +etoh. Pt states he landed on back with arms legs curled, pt unsure of LOC.  Pt driven to ED by girlfriend.

## 2017-08-26 NOTE — Progress Notes (Signed)
PT Cancellation Note  Patient Details Name: Patrick [name] MRN: [id number] DOB: [birth year]   Cancelled Treatment:    Reason Eval/Treat Not Completed: Medical issues which prohibited therapy: Note imminent discharge.  Awaiting TLSO to initiate mobility eval and education prior to d/c.  Will return as soon as able once brace delivered.   Narda Amber [facility] 08/26/2017, 1:02 PM

## 2017-08-26 NOTE — Progress Notes (Signed)
Cleared C-spine. Informed patient about TLSO brace and OP f/u with Dr. Lovell Sheehan. Pt voiced understanding. PT/OT. Hopefully home tomorrow.  Wells Guiles , [facility] 08/26/2017, 10:34 AM Pager: [phone] Mon-Fri 7:00 am-4:30 pm Sat-Sun 7:00 am-11:30 am

## 2017-08-27 ENCOUNTER — Other Ambulatory Visit: Payer: Self-pay

## 2017-08-27 MED ORDER — ACETAMINOPHEN 325 MG PO TABS: 1000.0000 mg | ORAL_TABLET | Freq: Four times a day (QID) | ORAL | Status: DC | PRN

## 2017-08-27 MED ORDER — DOCUSATE SODIUM 100 MG PO CAPS: 100.0000 mg | ORAL_CAPSULE | Freq: Two times a day (BID) | ORAL | 0 refills | Status: DC

## 2017-08-27 MED ORDER — OXYCODONE HCL 10 MG PO TABS: 10.0000 mg | ORAL_TABLET | ORAL | 0 refills | Status: DC | PRN

## 2017-08-27 MED ORDER — METHOCARBAMOL 500 MG PO TABS: 500.0000 mg | ORAL_TABLET | Freq: Three times a day (TID) | ORAL | 0 refills | Status: DC | PRN

## 2017-08-27 MED ORDER — LISINOPRIL 5 MG PO TABS: 5.0000 mg | ORAL_TABLET | Freq: Every day | ORAL | 0 refills | Status: DC

## 2017-08-27 NOTE — Progress Notes (Signed)
Patient discharged to home with instructions, prescriptions and 3 equipments.

## 2017-08-27 NOTE — Discharge Summary (Signed)
Patient ID: Patrick [name] [id number] Nov 19, 1975 42 y.o.  Admit date: 08/26/2017 Discharge date: 08/27/2017  Admitting Diagnosis: Fall  L2 compression fracture 12th rib fracture  Discharge Diagnosis Patient Active Problem List   Diagnosis Date Noted  . Closed compression fracture of L2 lumbar vertebra, initial encounter (HCC) 08/26/2017  . Behavior change due to substance use 02/27/2017  . Alcohol use disorder, severe, dependence (HCC) 02/27/2017    Consultants Dr. Delma Officer - Neurosurgery  Reason for Admission: 42 year old with history of hypertension and alcohol use disorder who presents via private vehicle and triaged as a level 2 trauma alert few hours ago having fallen out of a tree stand approximately 20 feet off the ground.  He landed on some grass and thinks he landed on his feet first and then rolled onto his back.  Complains of pain in his lower back.  Positive loss of consciousness.  He does endorse alcohol consumption earlier in the evening. Imaging workup as below reveals an L2 compression fracture and a 12th rib facture.   Procedures none  Hospital Course:  The patient was admitted and NS was consulted.  After review of his imaging, he was felt to need a TLSO brace.  He then had his pain controlled with oral pain medications. He was evaluated by PT/OT where he was found to need some HHPT. He was tolerating a solid diet, voiding, and otherwise stable for DC home on HD 1.  He was also noted to have HTN.  He states he does at home too when he checks it.  He was given prn hydralazine and lopressor which did not make much difference.  He will be discharged on oral lisinopril , low dose, to start.  We will give him information on obtained a PCP without insurance so he can arrange follow up with a PCP provider to manage this condition long-term.  Physical Exam: See note from earlier today.  Allergies as of 08/27/2017      Reactions   Mushroom Extract Complex  Diarrhea, Other (See Comments)   Itchy throat      Medication List    TAKE these medications   acetaminophen 325 MG tablet Commonly known as:  TYLENOL Take 3 tablets (975 mg total) by mouth every 6 (six) hours as needed for mild pain.   docusate sodium 100 MG capsule Commonly known as:  COLACE Take 1 capsule (100 mg total) by mouth 2 (two) times daily.   ibuprofen 200 MG tablet Commonly known as:  ADVIL,MOTRIN Take 200-800 mg by mouth every 6 (six) hours as needed for moderate pain.   lisinopril 5 MG tablet Commonly known as:  PRINIVIL,ZESTRIL Take 1 tablet (5 mg total) by mouth daily.   loratadine 10 MG tablet Commonly known as:  CLARITIN Take 10 mg by mouth daily as needed for allergies.   methocarbamol 500 MG tablet Commonly known as:  ROBAXIN Take 1 tablet (500 mg total) by mouth every 8 (eight) hours as needed for muscle spasms.   multivitamin with minerals Tabs tablet Take 1 tablet by mouth at bedtime.   omeprazole 20 MG capsule Commonly known as:  PRILOSEC Take 20 mg by mouth daily as needed (acid reflux).   Oxycodone HCl 10 MG Tabs Take 1 tablet (10 mg total) by mouth every 4 (four) hours as needed.            Durable Medical Equipment  (From admission, onward)        Start  Ordered   08/27/17 0945  For home use only DME 3 n 1  Once     08/27/17 0944   08/27/17 0945  For home use only DME Walker rolling  Once    Question:  Patient needs a walker to treat with the following condition  Answer:  Closed compression fracture of L2 lumbar vertebra, initial encounter (HCC)   08/27/17 0944   08/27/17 0945  For home use only DME Tub bench  Once     08/27/17 4098       Follow-up Information    Tressie Stalker, MD. Schedule an appointment as soon as possible for a visit in 3 week(s).   Specialty:  Neurosurgery Contact information: [address] [place] [address] [phone]        Primary care provider Follow up.   Why:   establish a primary care provider for follow up of your high blood pressure          Signed: Barnetta Chapel, [facility] Surgery 08/27/2017, 11:02 AM Pager: [phone]

## 2017-08-27 NOTE — Progress Notes (Signed)
Patient ID: Patrick [name], male   DOB: Sep 06, 1975, 42 y.o.   MRN: [id number]       Subjective: Up with TLSO brace in place.  Patient states he has had BP issues at home, but has no insurance and no PCP to manage it.  Objective: Vital signs in last 24 hours: Temp:  [98.2 F (36.8 C)-101.2 F (38.4 C)] 98.5 F (36.9 C) (04/29 0551) Pulse Rate:  [67-95] 67 (04/29 0551) Resp:  [17-20] 20 (04/29 0443) BP: (138-188)/(101-123) 138/101 (04/29 0551) SpO2:  [95 %-99 %] 99 % (04/29 0551) Last BM Date: 08/25/17  Intake/Output from previous day: 04/28 0701 - 04/29 0700 In: 1062 [P.O.:1062] Out: 1400 [Urine:1400] Intake/Output this shift: No intake/output data recorded.  PE: Gen: NAD Heart: regular Lungs: CTAB Abd: soft, NT, TLSO brace in place  Lab Results:  Recent Labs    08/26/17 0120 08/26/17 0127  WBC 6.5  --   HGB 16.6 15.6  HCT 47.0 46.0  PLT 122*  --    BMET Recent Labs    08/26/17 0120 08/26/17 0127  NA 139 140  K 3.3* 3.2*  CL 105 105  CO2 23  --   GLUCOSE 155* 148*  BUN 9 8  CREATININE 0.97 1.10  CALCIUM 8.6*  --    PT/INR Recent Labs    08/26/17 0120  LABPROT 12.6  INR 0.95   CMP     Component Value Date/Time   NA 140 08/26/2017 0127   K 3.2 (L) 08/26/2017 0127   CL 105 08/26/2017 0127   CO2 23 08/26/2017 0120   GLUCOSE 148 (H) 08/26/2017 0127   BUN 8 08/26/2017 0127   CREATININE 1.10 08/26/2017 0127   CALCIUM 8.6 (L) 08/26/2017 0120   PROT 6.8 08/26/2017 0120   ALBUMIN 3.6 08/26/2017 0120   AST 81 (H) 08/26/2017 0120   ALT 88 (H) 08/26/2017 0120   ALKPHOS 58 08/26/2017 0120   BILITOT 0.7 08/26/2017 0120   GFRNONAA >60 08/26/2017 0120   GFRAA >60 08/26/2017 0120   Lipase     Component Value Date/Time   LIPASE 24 06/03/2015 2149       Studies/Results: Ct Head Wo Contrast  Result Date: 08/26/2017 CLINICAL DATA:  Patient fell from a tree stand 20 feet. Unsure loss of consciousness. EXAM: CT HEAD WITHOUT CONTRAST CT CERVICAL SPINE  WITHOUT CONTRAST TECHNIQUE: Multidetector CT imaging of the head and cervical spine was performed following the standard protocol without intravenous contrast. Multiplanar CT image reconstructions of the cervical spine were also generated. COMPARISON:  None. FINDINGS: CT HEAD FINDINGS Brain: No evidence of acute infarction, hemorrhage, hydrocephalus, extra-axial collection or mass lesion/mass effect. Vascular: No hyperdense vessel or unexpected calcification. Skull: Normal. Negative for fracture or focal lesion. Sinuses/Orbits: Retention cysts in the maxillary antra. No acute air-fluid levels. Mastoid air cells are not opacified. Other: None. CT CERVICAL SPINE FINDINGS Alignment: Normal. Skull base and vertebrae: No acute fracture. No primary bone lesion or focal pathologic process. Soft tissues and spinal canal: No prevertebral fluid or swelling. No visible canal hematoma. Disc levels:  Intervertebral disc space heights are preserved. Upper chest: Infiltration or edema in the lung apices. Other: None. IMPRESSION: 1. No acute intracranial abnormalities. 2. Normal alignment of the cervical spine. No acute displaced fractures identified. Electronically Signed   By: Burman Nieves M.D.   On: 08/26/2017 02:41   Ct Chest W Contrast  Result Date: 08/26/2017 CLINICAL DATA:  Patient fell 20 feet from a tree stand. Landed on  the back. EXAM: CT CHEST, ABDOMEN, AND PELVIS WITH CONTRAST TECHNIQUE: Multidetector CT imaging of the chest, abdomen and pelvis was performed following the standard protocol during bolus administration of intravenous contrast. CONTRAST:  ISOVUE-300 IOPAMIDOL (ISOVUE-300) INJECTION 61% COMPARISON:  None. FINDINGS: CT CHEST FINDINGS Cardiovascular: No significant vascular findings. Normal heart size. No pericardial effusion. Coronary artery calcifications. Mediastinum/Nodes: No enlarged mediastinal, hilar, or axillary lymph nodes. Thyroid gland, trachea, and esophagus demonstrate no  significant findings. Lungs/Pleura: Increased density in the posterior lungs bilaterally. This could represent edema, contusion, or atelectasis. No focal consolidation. No pleural effusions. No pneumothorax. Airways are patent. Musculoskeletal: Displaced fracture of the right twelfth rib. Ribs and thoracic spine appear otherwise intact. Sternum is not depressed. CT ABDOMEN PELVIS FINDINGS Hepatobiliary: Diffuse fatty infiltration of the liver. No focal liver lesions identified. Gallbladder and bile ducts are unremarkable. Pancreas: Unremarkable. No pancreatic ductal dilatation or surrounding inflammatory changes. Spleen: No splenic injury or perisplenic hematoma. Adrenals/Urinary Tract: No adrenal hemorrhage or renal injury identified. Bladder is unremarkable. Stomach/Bowel: Stomach is within normal limits. Appendix appears normal. No evidence of bowel wall thickening, distention, or inflammatory changes. Vascular/Lymphatic: Aortic atherosclerosis. No enlarged abdominal or pelvic lymph nodes. Reproductive: Prostate is unremarkable. Other: No abdominal wall hernia or abnormality. No abdominopelvic ascites. Musculoskeletal: There is an acute compression fracture of the L2 vertebral body mostly involving the superior endplate. Mild retropulsion of fracture fragments. Pedicles and posterior elements are not involved. Lumbar vertebrae, sacrum, pelvis, and hips appear otherwise intact. IMPRESSION: 1. Acute compression fracture of the L2 vertebral body with mild retropulsion of fracture fragment. 2. Acute fracture right twelfth rib. 3. No acute posttraumatic changes demonstrated in the mediastinum. 4. Posterior infiltrates in the lungs may represent edema, contusion, or atelectasis. No pneumothorax. 5. No evidence of solid organ injury or bowel perforation in the abdomen or pelvis. 6. Diffuse fatty infiltration of the liver. 7. Aortic atherosclerosis. These results were called by telephone at the time of interpretation on  08/26/2017 at 2:49 am to Dr. Dione Booze , who verbally acknowledged these results. Electronically Signed   By: Burman Nieves M.D.   On: 08/26/2017 02:51   Ct Cervical Spine Wo Contrast  Result Date: 08/26/2017 CLINICAL DATA:  Patient fell from a tree stand 20 feet. Unsure loss of consciousness. EXAM: CT HEAD WITHOUT CONTRAST CT CERVICAL SPINE WITHOUT CONTRAST TECHNIQUE: Multidetector CT imaging of the head and cervical spine was performed following the standard protocol without intravenous contrast. Multiplanar CT image reconstructions of the cervical spine were also generated. COMPARISON:  None. FINDINGS: CT HEAD FINDINGS Brain: No evidence of acute infarction, hemorrhage, hydrocephalus, extra-axial collection or mass lesion/mass effect. Vascular: No hyperdense vessel or unexpected calcification. Skull: Normal. Negative for fracture or focal lesion. Sinuses/Orbits: Retention cysts in the maxillary antra. No acute air-fluid levels. Mastoid air cells are not opacified. Other: None. CT CERVICAL SPINE FINDINGS Alignment: Normal. Skull base and vertebrae: No acute fracture. No primary bone lesion or focal pathologic process. Soft tissues and spinal canal: No prevertebral fluid or swelling. No visible canal hematoma. Disc levels:  Intervertebral disc space heights are preserved. Upper chest: Infiltration or edema in the lung apices. Other: None. IMPRESSION: 1. No acute intracranial abnormalities. 2. Normal alignment of the cervical spine. No acute displaced fractures identified. Electronically Signed   By: Burman Nieves M.D.   On: 08/26/2017 02:41   Ct Abdomen Pelvis W Contrast  Result Date: 08/26/2017 CLINICAL DATA:  Patient fell 20 feet from a tree stand. Landed on  the back. EXAM: CT CHEST, ABDOMEN, AND PELVIS WITH CONTRAST TECHNIQUE: Multidetector CT imaging of the chest, abdomen and pelvis was performed following the standard protocol during bolus administration of intravenous contrast. CONTRAST:   ISOVUE-300 IOPAMIDOL (ISOVUE-300) INJECTION 61% COMPARISON:  None. FINDINGS: CT CHEST FINDINGS Cardiovascular: No significant vascular findings. Normal heart size. No pericardial effusion. Coronary artery calcifications. Mediastinum/Nodes: No enlarged mediastinal, hilar, or axillary lymph nodes. Thyroid gland, trachea, and esophagus demonstrate no significant findings. Lungs/Pleura: Increased density in the posterior lungs bilaterally. This could represent edema, contusion, or atelectasis. No focal consolidation. No pleural effusions. No pneumothorax. Airways are patent. Musculoskeletal: Displaced fracture of the right twelfth rib. Ribs and thoracic spine appear otherwise intact. Sternum is not depressed. CT ABDOMEN PELVIS FINDINGS Hepatobiliary: Diffuse fatty infiltration of the liver. No focal liver lesions identified. Gallbladder and bile ducts are unremarkable. Pancreas: Unremarkable. No pancreatic ductal dilatation or surrounding inflammatory changes. Spleen: No splenic injury or perisplenic hematoma. Adrenals/Urinary Tract: No adrenal hemorrhage or renal injury identified. Bladder is unremarkable. Stomach/Bowel: Stomach is within normal limits. Appendix appears normal. No evidence of bowel wall thickening, distention, or inflammatory changes. Vascular/Lymphatic: Aortic atherosclerosis. No enlarged abdominal or pelvic lymph nodes. Reproductive: Prostate is unremarkable. Other: No abdominal wall hernia or abnormality. No abdominopelvic ascites. Musculoskeletal: There is an acute compression fracture of the L2 vertebral body mostly involving the superior endplate. Mild retropulsion of fracture fragments. Pedicles and posterior elements are not involved. Lumbar vertebrae, sacrum, pelvis, and hips appear otherwise intact. IMPRESSION: 1. Acute compression fracture of the L2 vertebral body with mild retropulsion of fracture fragment. 2. Acute fracture right twelfth rib. 3. No acute posttraumatic changes demonstrated  in the mediastinum. 4. Posterior infiltrates in the lungs may represent edema, contusion, or atelectasis. No pneumothorax. 5. No evidence of solid organ injury or bowel perforation in the abdomen or pelvis. 6. Diffuse fatty infiltration of the liver. 7. Aortic atherosclerosis. These results were called by telephone at the time of interpretation on 08/26/2017 at 2:49 am to Dr. Dione Booze , who verbally acknowledged these results. Electronically Signed   By: Burman Nieves M.D.   On: 08/26/2017 02:51   Dg Pelvis Portable  Result Date: 08/26/2017 CLINICAL DATA:  Patient fell from a tree stand about 20 feet. EXAM: PORTABLE PELVIS 1-2 VIEWS COMPARISON:  None. FINDINGS: There is no evidence of pelvic fracture or diastasis. No pelvic bone lesions are seen. IMPRESSION: Negative. Electronically Signed   By: Burman Nieves M.D.   On: 08/26/2017 01:54   Dg Chest Port 1 View  Result Date: 08/26/2017 CLINICAL DATA:  Patient fell from a tree stand about 20 feet. Alcohol use. Unsure loss of consciousness. EXAM: PORTABLE CHEST 1 VIEW COMPARISON:  05/26/2016 FINDINGS: Shallow inspiration. Normal heart size and pulmonary vascularity. No focal airspace disease or consolidation in the lungs. No blunting of costophrenic angles. No pneumothorax. Mediastinal contours appear intact. IMPRESSION: No active disease. Electronically Signed   By: Burman Nieves M.D.   On: 08/26/2017 01:52    Anti-infectives: Anti-infectives (From admission, onward)   None       Assessment/Plan Fall from deer stand L2 compression fracture - TLSO brace, follow up with Dr. Lovell Sheehan HTN - uncontrolled.  On prn hydralazine.  Will need meds at home.  Will have case manage discuss PCP options with no insurance.  Would like to start something prior to discharge.  Will d/w MD.  Beta blocker may not be a good choice with HR in the 60s, but we'll see.  FEN - regular diet VTE - Lovenox Dispo - home with PT and DME equipment.  Will need to  establish a PCP for HTN.  F/u withi Dr. Lovell Sheehan in a few weeks.   LOS: 0 days    Letha Cape , [facility] 08/27/2017, 9:42 AM Pager: [phone]

## 2017-08-27 NOTE — Progress Notes (Signed)
Physical Therapy Treatment Patient Details Name: Patrick Patrick [name] MRN: [id number] DOB: [birth year] Today's Date: 08/27/2017    History of Present Illness Pt. is a 42 y.o. with significant PMH of HTN and alcohol use disorder who presents after fallen out of a tree stand. Found to have T12 compression fracture and 12th rib fracture.     PT Comments    Pt admitted with above diagnosis. Pt currently with functional limitations due to the deficits listed below (see PT Problem List). Patient showing greater understanding of log roll, don/doff TLSO, importance of wearing TLSO anytime not lying down, and also of pain management vs activity tolerance and the balance between them and the need for pain medication. Pt will benefit from skilled PT to increase their independence and safety with mobility to allow discharge to the venue listed below.  Patient is making good progress with PT.  From a mobility standpoint anticipate patient will be ready for DC home today.      Follow Up Recommendations  Home health PT     Equipment Recommendations  Rolling walker with 5" wheels;3in1 (PT)    Recommendations for Other Services       Precautions / Restrictions Precautions Precautions: Fall;Back Required Braces or Orthoses: Spinal Brace(When ambulating, only when OOB. Don in bed) Spinal Brace: Thoracolumbosacral orthotic Restrictions Weight Bearing Restrictions: No    Mobility  Bed Mobility               General bed mobility comments: patient sitting in recliner when therapist arrived and at end of session.  Transfers Overall transfer level: Needs assistance Equipment used: None Transfers: Sit to/from Patrick Patrick [name] Sit to Stand: Supervision Stand pivot transfers: Supervision       General transfer comment: Supervision for safety.   Ambulation/Gait Ambulation/Gait assistance: Supervision Ambulation Distance (Feet): 100 Feet Assistive device: Rolling walker (2 wheeled) Gait  Pattern/deviations: Step-through pattern;Decreased stride length     General Gait Details: slow and steady gait speed using RW for pain control. Uses UEs to unload spine to help control pain.    Stairs             Wheelchair Mobility    Modified Rankin (Stroke Patients Only)       Balance Overall balance assessment: Needs assistance Sitting-balance support: No upper extremity supported;Feet supported Sitting balance-Patrick [name] Scale: Good     Standing balance support: During functional activity;No upper extremity supported Standing balance-Patrick [name] Scale: Fair                              Cognition Arousal/Alertness: Awake/alert Behavior During Therapy: WFL for tasks assessed/performed Overall Cognitive Status: Within Functional Limits for tasks assessed                                        Exercises      General Comments        Pertinent Vitals/Pain Pain Score: 9  Pain Location: Back Pain Descriptors / Indicators: Discomfort;Guarding Pain Intervention(s): Limited activity within patient's tolerance;Monitored during session    Home Living Family/patient expects to be discharged to:: Private residence Living Arrangements: Spouse/significant other Available Help at Discharge: Family;Friend(s) Type of Home: House Home Access: Level entry   Home Layout: Able to live on main level with bedroom/bathroom Home Equipment: None      Prior Function Level of Independence: Independent  PT Goals (current goals can now be found in the care plan section) Acute Rehab PT Goals Patient Stated Goal: Decrease pain PT Goal Formulation: With patient Time For Goal Achievement: 09/09/17 Potential to Achieve Goals: Good Additional Goals Additional Goal #1: Patient will don and doff brace independently Progress towards PT goals: Progressing toward goals    Frequency    Min 5X/week      PT Plan Current plan remains appropriate     Co-evaluation              AM-PAC PT "6 Clicks" Daily Activity  Outcome Measure  Difficulty turning over in bed (including adjusting bedclothes, sheets and blankets)?: None Difficulty moving from lying on back to sitting on the side of the bed? : None Difficulty sitting down on and standing up from a chair with arms (e.g., wheelchair, bedside commode, etc,.)?: A Little Help needed moving to and from a bed to chair (including a wheelchair)?: A Little Help needed walking in hospital room?: A Little Help needed climbing 3-5 steps with a railing? : A Little 6 Click Score: 20    End of Session Equipment Utilized During Treatment: Back brace Activity Tolerance: Patient limited by pain;Patient tolerated treatment well Patient left: in chair;with call bell/phone within reach;with family/visitor present Nurse Communication: Mobility status PT Visit Diagnosis: Pain;Difficulty in walking, not elsewhere classified (R26.2) Pain - part of body: (back)     Time: 8469-6295 PT Time Calculation (min) (ACUTE ONLY): 24 min  Charges:  $Gait Training: 8-22 mins $Therapeutic Activity: 8-22 mins                    G Codes:       Patrick Bermingham D. Hartnett-Rands, MS, PT [place] [facility] [place] [phone] 08/27/2017, 2:52 PM

## 2017-08-27 NOTE — Progress Notes (Signed)
SLP Cancellation Note  Patient Details Name: Patrick [name] MRN: [id number] DOB: [birth year]   Cancelled treatment:   Pt with D/C paperwork in hand and preparing to leave.  Briefly discussed s/s of concussion and encouraged pt to f/u with PCP if symptoms become evident.  He denied difficulty currently but agreed to f/u if necessary.        Blenda Mounts Laurice 08/27/2017, 12:40 PM

## 2017-08-27 NOTE — Care Management Note (Signed)
Case Management Note  Patient Details  Name: Chang Tiggs MRN: [id number] Date of Birth: November 29, 1975  Subjective/Objective:  Pt. is a 42 y.o. with significant PMH of HTN and alcohol use disorder who presents after fallen out of a tree stand. Found to have T12 compression fracture and 12th rib fracture.  PTA, pt independent, lives at home with significant other.                   Action/Plan: Pt medically stable for dc home today with significant other.  PT recommending HH follow up, DME.  Referral to [facility] to evaluate for [facility] and DME charity program.  Pt is uninsured, but is eligible for medication assistance through [facility] program. [facility] letter given with explanation of program benefits.  Pt needs PCP follow up; called for follow up appointment at [facility] and [facility], but no appointments available.  Clinic info put on AVS.  Pt instructed to call next Monday at 8:30am for appointment.  He verbalizes understanding of instructions.     Expected Discharge Date:  08/27/17               Expected Discharge Plan:  Home w Home Health Services  In-House Referral:  Clinical Social Work  Discharge planning Services  CM Consult  Post Acute Care Choice:  Home Health Choice offered to:  Patient  DME Arranged:  3-N-1, Walker rolling, Tub bench DME Agency:  [facility]  HH Arranged:  PT HH Agency:  [facility]  Status of Service:  Completed, signed off  If discussed at Long Length of Stay Meetings, dates discussed:    Additional Comments:  Quintella Baton, RN, BSN  Trauma/Neuro ICU Case Manager [phone]

## 2017-08-27 NOTE — Evaluation (Signed)
Occupational Therapy Evaluation Patient Details Name: Patrick [name] MRN: [id number] DOB: 1976/02/10 Today's Date: 08/27/2017    History of Present Illness Pt. is a 42 y.o. with significant PMH of HTN and alcohol use disorder who presents after fallen out of a tree stand. Found to have T12 compression fracture and 12th rib fracture.    Clinical Impression   PTA, pt was independent with ADL and functional mobility. He currently requires overall supervision for standing grooming tasks and LB ADL with cues for back precautions. Educated pt that per MD, he should don brace in bed prior to getting OOB, which proved to be frustrating to pt as he reports "this is not how I have been doing it." Reinforced protection of spine and spinal cord while T12 fracture heals. Pt currently requiring max assist to don/doff brace. Pt would benefit from continued OT services while admitted to improve independence and safety with ADL and functional mobility. Recommend initial 24 hour assistance post-acute D/C and pt verbalized understanding.     Follow Up Recommendations  No OT follow up;Supervision/Assistance - 24 hour(initial 24 hour assistance)    Equipment Recommendations  3 in 1 bedside commode;Tub/shower seat    Recommendations for Other Services       Precautions / Restrictions Precautions Precautions: Fall;Back Required Braces or Orthoses: Spinal Brace(when OOB/amb or when HOB>45 deg; don in bed) Spinal Brace: Thoracolumbosacral orthotic(don in bed) Restrictions Weight Bearing Restrictions: No      Mobility Bed Mobility Overal bed mobility: Needs Assistance Bed Mobility: Rolling;Sidelying to Sit Rolling: Supervision Sidelying to sit: Supervision       General bed mobility comments: Pt impulsively attempting to sit straight up in bed. OT educated on log roll technique. Pt states "that is not how I have been doing this" although note PT spent significant time educating pt concerning log roll  during her evaluation yesterday.   Transfers Overall transfer level: Needs assistance Equipment used: None Transfers: Sit to/from Stand Sit to Stand: Supervision         General transfer comment: Supervision for safety.     Balance Overall balance assessment: No apparent balance deficits (not formally assessed)                                         ADL either performed or assessed with clinical judgement   ADL Overall ADL's : Needs assistance/impaired Eating/Feeding: Set up;Sitting   Grooming: Supervision/safety;Standing   Upper Body Bathing: Supervision/ safety;Sitting   Lower Body Bathing: Supervison/ safety;Sit to/from stand   Upper Body Dressing : Supervision/safety;Sitting   Lower Body Dressing: Supervision/safety;Sit to/from stand   Toilet Transfer: Supervision/safety;Ambulation;RW   Toileting- Clothing Manipulation and Hygiene: Supervision/safety;Sit to/from stand   Tub/ Shower Transfer: Supervision/safety;Ambulation;Walk-in shower   Functional mobility during ADLs: Supervision/safety General ADL Comments: Pt able to complete LB dressing tasks with compensatory techniques with cues for reinforcement. Pt instructed in use of 3-in-1 BSC for toilet seat and need to sit in shower for safety. Pt reporting frustration with OT due to educating him to don brace while in bed. OT reinforced that this is what Dr. Lovell Sheehan stated in his note in order to protect his spinal cord while spinal fracture heals.      Vision Patient Visual Report: No change from baseline Vision Assessment?: No apparent visual deficits     Perception     Praxis      Pertinent  Vitals/Pain Pain Assessment: 0-10 Pain Score: 9  Pain Location: Back Pain Descriptors / Indicators: Discomfort;Guarding Pain Intervention(s): Limited activity within patient's tolerance;Monitored during session;Repositioned     Hand Dominance     Extremity/Trunk Assessment Upper Extremity  Assessment Upper Extremity Assessment: Overall WFL for tasks assessed   Lower Extremity Assessment Lower Extremity Assessment: Overall WFL for tasks assessed       Communication Communication Communication: No difficulties   Cognition Arousal/Alertness: Awake/alert Behavior During Therapy: WFL for tasks assessed/performed Overall Cognitive Status: Within Functional Limits for tasks assessed                                     General Comments  significant other present during session    Exercises Exercises: Other exercises   Shoulder Instructions      Home Living Family/patient expects to be discharged to:: Private residence Living Arrangements: Spouse/significant other Available Help at Discharge: Family;Friend(s) Type of Home: House Home Access: Level entry     Home Layout: Able to live on main level with bedroom/bathroom     Bathroom Shower/Tub: Producer, television/film/video: Standard     Home Equipment: None          Prior Functioning/Environment Level of Independence: Independent                 OT Problem List: Decreased strength;Decreased activity tolerance;Decreased range of motion;Impaired balance (sitting and/or standing);Decreased safety awareness;Decreased knowledge of use of DME or AE;Decreased knowledge of precautions;Pain      OT Treatment/Interventions:      OT Goals(Current goals can be found in the care plan section) Acute Rehab OT Goals Patient Stated Goal: Decrease pain OT Goal Formulation: With patient Time For Goal Achievement: 09/10/17 Potential to Achieve Goals: Good ADL Goals Pt Will Perform Grooming: with modified independence;standing Pt Will Perform Upper Body Dressing: with min assist;bed level(donning/doffing brace included) Additional ADL Goal #1: Pt will complete bed mobility in preparation for ADL seated at EOB adhering to back precautions independently.  OT Frequency:     Barriers to D/C:             Co-evaluation              AM-PAC PT "6 Clicks" Daily Activity     Outcome Measure Help from another person eating meals?: None Help from another person taking care of personal grooming?: A Little Help from another person toileting, which includes using toliet, bedpan, or urinal?: A Little Help from another person bathing (including washing, rinsing, drying)?: A Little Help from another person to put on and taking off regular upper body clothing?: A Lot Help from another person to put on and taking off regular lower body clothing?: A Little 6 Click Score: 18   End of Session Equipment Utilized During Treatment: Back brace(TLSO donned in bed) Nurse Communication: Mobility status;Other (comment)(per MD to don brace in bed prior to getting up)  Activity Tolerance: Patient tolerated treatment well Patient left: in chair;with call bell/phone within reach;with family/visitor present  OT Visit Diagnosis: Other abnormalities of gait and mobility (R26.89);Pain Pain - part of body: (ribcage; lower back)                Time: 0811-0829 OT Time Calculation (min): 18 min Charges:  OT General Charges $OT Visit: 1 Visit OT Evaluation $OT Eval Moderate Complexity: 1 Mod G-Codes:     Vernice Mannina A  Rashanna Christiana, MS OTR/L  Pager: [phone]   Larrisa Cravey A Oline Belk 08/27/2017, 9:30 AM

## 2017-08-27 NOTE — Discharge Instructions (Signed)
He should wear his TLSO when the head of bed is greater than 45 degrees and don it in bed.  He can take it off to shower but should not bend or twist.  ° ° °How to Use a Back Brace °A back brace is a form-fitting device that wraps around your trunk to support your lower back, abdomen, and hips. You may need to wear a back brace to relieve back pain or to correct a medical condition related to the back, such as abnormal curvature of the spine (scoliosis). A back brace can maintain or correct the shape of the spine and prevent a spinal problem from getting worse. A back brace can also take pressure off the layers of tissue (disks) between the bones of the spine (vertebrae). You may need a back brace to keep your back and spine in place while you heal from an injury or recover from surgery. °Back braces can be either plastic (rigid brace) or soft elastic (dynamic brace). A rigid brace usually covers both the front and back of the entire upper body. A soft brace may cover only the lower back and abdomen and may fasten with self-adhesive elastic straps. Your health care provider will recommend the proper brace for your needs and medical condition. °What are the risks? °· A back brace may not help if you do not wear it as directed by your health care provider. Be sure to wear the brace exactly as instructed in order to prevent further back problems. °· Wearing the brace may be uncomfortable at first. You may have trouble sleeping with it on. It may also be hard for you to do certain activities while wearing the brace. °How to use a back brace °Different types of braces will have different instructions for use. Follow instructions from your health care provider about: °· How to put on the brace. °· When and how often to wear the brace. In some cases, braces may need to be worn for long stretches of time. For example, a brace may need to be worn for 16-23 hours a day when used for scoliosis. °· How to take off the  brace. °· Any safety tips you should follow when wearing the brace. This may include: °? Moving carefully while wearing the brace. The brace restricts your movement and could lead to additional injuries. °? Using a cane or walker for support if you feel unsteady. °? Sitting in high, firm chairs. It may be difficult to stand up from low, soft chairs. ° °How to care for a back brace °· Do not let the back brace get wet. Typically, you will remove the brace for bathing and then put it back on afterward. °· If you have a rigid brace, be sure to store it in a safe place when you are not wearing it. This will help to prevent damage. °· Clean or wash the back brace with mild soap and water as told by your health care provider. °Contact a health care provider if: °· Your brace gets damaged. °· You have pain or discomfort when wearing the back brace. °· Your back pain is getting worse or is not improving over time. °This information is not intended to replace advice given to you by your health care provider. Make sure you discuss any questions you have with your health care provider. °Document Released: 04/06/2011 Document Revised: 05/13/2015 Document Reviewed: 12/09/2014 °Elsevier Interactive Patient Education © 2018 [employer] °Spinal Compression Fracture °A spinal compression fracture is a   collapse of the bones that form the spine (vertebrae). With this type of fracture, the vertebrae become squashed (compressed) into a wedge shape. Most compression fractures happen in the middle or lower part of the spine. °What are the causes? °This condition may be caused by: °· Thinning and loss of density in the bones (osteoporosis). This is the most common cause. °· A fall. °· A car or motorcycle accident. °· Cancer. °· Trauma, such as a heavy, direct hit to the head. ° °What increases the risk? °You may be at greater risk for a spinal compression fracture if you: °· Are 50 years old or older. °· Have osteoporosis. °· Have  certain types of cancer, including: °? Multiple myeloma. °? Lymphoma. °? Prostate cancer. °? Lung cancer. °? Breast cancer. ° °What are the signs or symptoms? °Symptoms of this condition include: °· Severe pain. °· Pain that gets worse over time. °· Pain that is worse when you stand, walk, sit, or bend. °· Sudden pain that is so bad that it is hard for you to move. °· Bending or humping of the spine. °· Gradual loss of height. °· Numbness, tingling, or weakness in the back and legs. °· Trouble walking. ° °Your symptoms will depend on the cause of the fracture and how quickly it develops. For example, fractures that are caused by osteoporosis can cause few symptoms, no symptoms, or symptoms that develop slowly over time. °How is this diagnosed? °This condition may be diagnosed based on symptoms, medical history, and a physical exam. During the physical exam, your health care provider may tap along the length of your spine to check for tenderness. Tests may be done to confirm the diagnosis. They may include: °· A bone density test to check for osteoporosis. °· Imaging tests, such as a spine X-ray, a CT scan, or MRI. ° °How is this treated? °Treatment for this condition depends on the cause and severity of the condition. Some fractures, such as those that are caused by osteoporosis, may heal on their own with supportive care. This may include: °· Pain medicine. °· Rest. °· A back brace. °· Physical therapy exercises. °· Medicine that reduces bone pain. °· Calcium and vitamin D supplements. ° °Fractures that cause the back to become misshapen, cause nerve pain or weakness, or do not respond to other treatment may be treated with a surgical procedure, such as: °· Vertebroplasty. In this procedure, bone cement is injected into the collapsed vertebrae to stabilize them. °· Balloon kyphoplasty. In this procedure, the collapsed vertebrae are expanded with a balloon and then bone cement is injected into them. °· Spinal  fusion. In this procedure, the collapsed vertebrae are connected (fused) to normal vertebrae. ° °Follow these instructions at home: °General instructions °· Take medicines only as directed by your health care provider. °· Do not drive or operate heavy machinery while taking pain medicine. °· If directed, apply ice to the injured area: °? Put ice in a plastic bag. °? Place a towel between your skin and the bag. °? Leave the ice on for 30 minutes every two hours at first. Then apply the ice as needed. °· Wear your neck brace or back brace as directed by your health care provider. °· Do not drink alcohol. Alcohol can interfere with your treatment. °· Keep all follow-up visits as directed by your health care provider. This is important. It can help to prevent permanent injury, disability, and long-lasting (chronic) pain. °Activity °· Stay in bed (on bed rest)   only as directed by your health care provider. Being on bed rest for too long can make your condition worse.  Return to your normal activities as directed by your health care provider. Ask what activities are safe for you.  Do exercises to improve motion and strength in your back (physical therapy), as recommended by your health care provider.  Exercise regularly as directed by your health care provider. Contact a health care provider if:  You have a fever.  You develop a cough that makes your pain worse.  Your pain medicine is not helping.  Your pain does not get better over time.  You cannot return to your normal activities as planned or expected. Get help right away if:  Your pain is very bad and it suddenly gets worse.  You are unable to move any body part (paralysis) that is below the level of your injury.  You have numbness, tingling, or weakness in any body part that is below the level of your injury.  You cannot control your bladder or bowels. This information is not intended to replace advice given to you by your health care  provider. Make sure you discuss any questions you have with your health care provider. Document Released: 04/17/2005 Document Revised: 12/14/2015 Document Reviewed: 04/21/2014 Elsevier Interactive Patient Education  [employer].

## 2017-09-29 ENCOUNTER — Emergency Department (HOSPITAL_COMMUNITY)
Admission: EM | Admit: 2017-09-29 | Discharge: 2017-09-29 | Disposition: A | Discharge status: Home / Self Care | Payer: Self-pay | Attending: Emergency Medicine | Admitting: Emergency Medicine

## 2017-09-29 ENCOUNTER — Encounter (HOSPITAL_COMMUNITY): Payer: Self-pay | Admitting: Emergency Medicine

## 2017-09-29 DIAGNOSIS — M549 Dorsalgia, unspecified: Secondary | ICD-10-CM | POA: Insufficient documentation

## 2017-09-29 DIAGNOSIS — Z5321 Procedure and treatment not carried out due to patient leaving prior to being seen by health care provider: Secondary | ICD-10-CM | POA: Insufficient documentation

## 2017-09-29 MED ORDER — KETOROLAC TROMETHAMINE 30 MG/ML IJ SOLN
30.0000 mg | Freq: Once | INTRAMUSCULAR | Status: AC
  Administered 2017-09-29: 30 mg via INTRAVENOUS
  Filled 2017-09-29: qty 1

## 2017-09-29 NOTE — ED Triage Notes (Signed)
Patient presents today with complaints of back pain. Reports fracture of L2 x1 month. Per EMS patient was family and fell and now has back pain.. Patient reports pain 10/10. Denies any any changes in bowel and bladder.

## 2017-10-01 NOTE — ED Notes (Signed)
Follow up call made   No answer  10/01/17 1055 s Loney Domingo rn

## 2017-12-05 ENCOUNTER — Encounter (HOSPITAL_COMMUNITY): Payer: Self-pay

## 2017-12-05 ENCOUNTER — Emergency Department (HOSPITAL_COMMUNITY): Payer: Self-pay

## 2017-12-05 ENCOUNTER — Other Ambulatory Visit: Payer: Self-pay

## 2017-12-05 ENCOUNTER — Emergency Department (HOSPITAL_COMMUNITY)
Admission: EM | Admit: 2017-12-05 | Discharge: 2017-12-05 | Disposition: A | Discharge status: Home / Self Care | Payer: Self-pay | Attending: Emergency Medicine | Admitting: Emergency Medicine

## 2017-12-05 DIAGNOSIS — Y990 Civilian activity done for income or pay: Secondary | ICD-10-CM | POA: Insufficient documentation

## 2017-12-05 DIAGNOSIS — X500XXA Overexertion from strenuous movement or load, initial encounter: Secondary | ICD-10-CM | POA: Insufficient documentation

## 2017-12-05 DIAGNOSIS — F1020 Alcohol dependence, uncomplicated: Secondary | ICD-10-CM | POA: Insufficient documentation

## 2017-12-05 DIAGNOSIS — S46001A Unspecified injury of muscle(s) and tendon(s) of the rotator cuff of right shoulder, initial encounter: Secondary | ICD-10-CM | POA: Insufficient documentation

## 2017-12-05 DIAGNOSIS — Z79899 Other long term (current) drug therapy: Secondary | ICD-10-CM | POA: Insufficient documentation

## 2017-12-05 DIAGNOSIS — S46209A Unspecified injury of muscle, fascia and tendon of other parts of biceps, unspecified arm, initial encounter: Secondary | ICD-10-CM

## 2017-12-05 DIAGNOSIS — F17210 Nicotine dependence, cigarettes, uncomplicated: Secondary | ICD-10-CM | POA: Insufficient documentation

## 2017-12-05 DIAGNOSIS — Y929 Unspecified place or not applicable: Secondary | ICD-10-CM | POA: Insufficient documentation

## 2017-12-05 DIAGNOSIS — Y9389 Activity, other specified: Secondary | ICD-10-CM | POA: Insufficient documentation

## 2017-12-05 DIAGNOSIS — I10 Essential (primary) hypertension: Secondary | ICD-10-CM | POA: Insufficient documentation

## 2017-12-05 NOTE — ED Provider Notes (Signed)
[facility] EMERGENCY DEPARTMENT Provider Note   CSN: [id number] Arrival date & time: 12/05/17  1252     History   Chief Complaint Chief Complaint  Patient presents with  . Arm Pain    HPI Patrick [name] is a 42 y.o. male.  Patient is a 42 year old male with a history of hypertension presenting today with pain in the right elbow.  Patient was at work yesterday and he was lifted a door and threw it over his head which is something he does normally and he felt a pop in his right elbow area and since that time he has had severe pain in the [facility] area of the right elbow and pain with bending.  The pain is made worse with bending his arm he noticed this morning that his right hand was swollen.  He denies any other injury.  No numbness or tingling of his hand.  No prior injury other than having tendinitis in the past.  The history is provided by the patient.  Arm Pain  This is a new problem. The current episode started yesterday. The problem occurs constantly. The problem has not changed since onset.Associated symptoms comments: Pain in the right elbow and swelling of the right hand. The symptoms are aggravated by bending. Nothing relieves the symptoms. Treatments tried: brace and ibuprofen. The treatment provided mild relief.    Past Medical History:  Diagnosis Date  . Eczema   . Hypertension   . Migraine headache     Patient Active Problem List   Diagnosis Date Noted  . Closed compression fracture of L2 lumbar vertebra, initial encounter (HCC) 08/26/2017  . Behavior change due to substance use 02/27/2017  . Alcohol use disorder, severe, dependence (HCC) 02/27/2017    History reviewed. No pertinent surgical history.      Home Medications    Prior to Admission medications   Medication Sig Start Date End Date Taking? Authorizing Provider  acetaminophen (TYLENOL) 325 MG tablet Take 3 tablets (975 mg total) by mouth every 6 (six) hours as needed for mild pain.  08/27/17   Barnetta Chapel, PA-C  docusate sodium (COLACE) 100 MG capsule Take 1 capsule (100 mg total) by mouth 2 (two) times daily. 08/27/17   Barnetta Chapel, PA-C  ibuprofen (ADVIL,MOTRIN) 200 MG tablet Take 200-800 mg by mouth every 6 (six) hours as needed for moderate pain.    [provider]  lisinopril (PRINIVIL,ZESTRIL) 5 MG tablet Take 1 tablet (5 mg total) by mouth daily. 08/27/17 09/26/17  Barnetta Chapel, PA-C  loratadine (CLARITIN) 10 MG tablet Take 10 mg by mouth daily as needed for allergies.    [provider]  methocarbamol (ROBAXIN) 500 MG tablet Take 1 tablet (500 mg total) by mouth every 8 (eight) hours as needed for muscle spasms. 08/27/17   Barnetta Chapel, PA-C  Multiple Vitamin (MULTIVITAMIN WITH MINERALS) TABS Take 1 tablet by mouth at bedtime.     [provider]  omeprazole (PRILOSEC) 20 MG capsule Take 20 mg by mouth daily as needed (acid reflux).    [provider]  Oxycodone HCl 10 MG TABS Take 1 tablet (10 mg total) by mouth every 4 (four) hours as needed. 08/27/17   Barnetta Chapel, PA-C    Family History History reviewed. No pertinent family history.  Social History Social History   Tobacco Use  . Smoking status: Current Every Day Smoker    Packs/day: 1.00    Types: Cigarettes  . Smokeless tobacco: Never Used  Substance Use Topics  . Alcohol use: Yes    Comment: every day depends on day of week as to how much  . Drug use: Yes    Types: Marijuana    Comment: every day     Allergies   Mushroom extract complex   Review of Systems Review of Systems  All other systems reviewed and are negative.    Physical Exam Updated Vital Signs BP 126/87 (BP Location: Left Arm)   Pulse 76   Temp 97.8 F (36.6 C) (Oral)   Resp 18   Ht 5\' 11"  (1.803 m)   Wt 79.4 kg (175 lb)   SpO2 100%   BMI 24.41 kg/m   Physical Exam  Constitutional: He is oriented to person, place, and time. He appears well-developed and well-nourished. No  distress.  HENT:  Head: Normocephalic and atraumatic.  Eyes: Pupils are equal, round, and reactive to light.  Cardiovascular: Normal rate and intact distal pulses.  Pulmonary/Chest: Effort normal.  Musculoskeletal:       Right elbow: He exhibits swelling. He exhibits normal range of motion and no deformity. No medial epicondyle, no lateral epicondyle and no olecranon process tenderness noted.       Arms: Normal sensation in the right hand and 2+ radial pulse  Neurological: He is alert and oriented to person, place, and time.  Skin: Skin is warm and dry.  Psychiatric: He has a normal mood and affect. His behavior is normal.  Nursing note and vitals reviewed.    ED Treatments / Results  Labs (all labs ordered are listed, but only abnormal results are displayed) Labs Reviewed - No data to display  EKG None  Radiology Dg Elbow Complete Right  Result Date: 12/05/2017 CLINICAL DATA:  Right elbow pain after feeling a pop yesterday. EXAM: RIGHT ELBOW - COMPLETE 3+ VIEW COMPARISON:  Right elbow x-rays dated October 15, 2011. FINDINGS: There is no evidence of fracture, dislocation, or joint effusion. There is no evidence of arthropathy or other focal bone abnormality. Soft tissues are unremarkable. IMPRESSION: Negative. Electronically Signed   By: Obie DredgeWilliam T Derry M.D.   On: 12/05/2017 13:29    Procedures Procedures (including critical care time)  Medications Ordered in ED Medications - No data to display   Initial Impression / Assessment and Plan / ED Course  I have reviewed the triage vital signs and the nursing notes.  Pertinent labs & imaging results that were available during my care of the patient were reviewed by me and considered in my medical decision making (see chart for details).     Patient presenting today after an injury to his right arm yesterday.  Concern for possible bicep tendon injury.  He does not have complete rupture because he is able to flex and extend the arm  and he does not have a high riding bicep.  He does however have swelling of the arm but is neurovascularly intact.  Recommended continuing ice, Ace wrap and NSAIDs.  Follow-up with orthopedics if it is not better in 1 week.  Low suspicion for elbow injury and imaging is negative.  Final Clinical Impressions(s) / ED Diagnoses   Final diagnoses:  Injury of biceps brachii muscle    ED Discharge Orders    None       Gwyneth SproutPlunkett, Tanairi Cypert, MD 12/05/17 1549

## 2017-12-05 NOTE — ED Triage Notes (Signed)
Pt endorse working yesterday and felt something pop in his right elbow with pain. Has hx of tendonitis in same arm. Wearing elbow brace from home. PMS intact.

## 2017-12-05 NOTE — Discharge Instructions (Signed)
No lifting with the right arm.  Ice and use ibuprofen for pain and swelling.  Try to elevate when you can.  Follow-up in 1 week if symptoms are not improving

## 2017-12-05 NOTE — ED Notes (Signed)
Pt ambulated to lobby with wife after d/c paperwork was given. Pt denied any questions or concerns at time of d/c. Work note included; no prescriptions.

## 2018-04-10 ENCOUNTER — Emergency Department (HOSPITAL_COMMUNITY)
Admission: EM | Admit: 2018-04-10 | Discharge: 2018-04-10 | Disposition: A | Discharge status: Home / Self Care | Payer: Self-pay | Attending: Emergency Medicine | Admitting: Emergency Medicine

## 2018-04-10 ENCOUNTER — Encounter (HOSPITAL_COMMUNITY): Payer: Self-pay | Admitting: Emergency Medicine

## 2018-04-10 ENCOUNTER — Emergency Department (HOSPITAL_COMMUNITY): Payer: Self-pay

## 2018-04-10 DIAGNOSIS — F17210 Nicotine dependence, cigarettes, uncomplicated: Secondary | ICD-10-CM | POA: Insufficient documentation

## 2018-04-10 DIAGNOSIS — Z7982 Long term (current) use of aspirin: Secondary | ICD-10-CM | POA: Insufficient documentation

## 2018-04-10 DIAGNOSIS — I10 Essential (primary) hypertension: Secondary | ICD-10-CM | POA: Insufficient documentation

## 2018-04-10 DIAGNOSIS — R569 Unspecified convulsions: Secondary | ICD-10-CM | POA: Insufficient documentation

## 2018-04-10 LAB — CBC
HEMATOCRIT: 51.8 % (ref 39.0–52.0)
Hemoglobin: 17.6 g/dL — ABNORMAL HIGH (ref 13.0–17.0)
MCH: 34 pg (ref 26.0–34.0)
MCHC: 34 g/dL (ref 30.0–36.0)
MCV: 100 fL (ref 80.0–100.0)
PLATELETS: 124 10*3/uL — AB (ref 150–400)
RBC: 5.18 MIL/uL (ref 4.22–5.81)
RDW: 12.1 % (ref 11.5–15.5)
WBC: 7.3 10*3/uL (ref 4.0–10.5)
nRBC: 0 % (ref 0.0–0.2)

## 2018-04-10 LAB — BASIC METABOLIC PANEL
Anion gap: 12 (ref 5–15)
BUN: 10 mg/dL (ref 6–20)
CO2: 25 mmol/L (ref 22–32)
Calcium: 9.2 mg/dL (ref 8.9–10.3)
Chloride: 102 mmol/L (ref 98–111)
Creatinine, Ser: 1.25 mg/dL — ABNORMAL HIGH (ref 0.61–1.24)
GLUCOSE: 129 mg/dL — AB (ref 70–99)
Potassium: 3.5 mmol/L (ref 3.5–5.1)
SODIUM: 139 mmol/L (ref 135–145)

## 2018-04-10 MED ORDER — LISINOPRIL 5 MG PO TABS: 5.0000 mg | ORAL_TABLET | Freq: Every day | ORAL | 0 refills | Status: DC

## 2018-04-10 NOTE — ED Notes (Signed)
Patient transported to CT SCAN . 

## 2018-04-10 NOTE — ED Provider Notes (Signed)
[facility] EMERGENCY DEPARTMENT Provider Note   CSN: [id number] Arrival date & time: 04/10/18  1953     History   Chief Complaint Chief Complaint  Patient presents with  . Seizures    HPI Patrick [name] is a 42 y.o. male.  The history is provided by the patient.  Seizures   This is a new problem. The current episode started less than 1 hour ago. The problem has been resolved. There was 1 seizure. The most recent episode lasted less than 30 seconds. Associated symptoms include confusion. Pertinent negatives include no speech difficulty, no visual disturbance, no neck stiffness, no sore throat, no chest pain, no cough, no vomiting, no diarrhea and no muscle weakness. Characteristics include rhythmic jerking and loss of consciousness. Characteristics do not include bladder incontinence. The episode was witnessed. There was no sensation of an aura present. The seizures did not continue in the ED. The seizure(s) had upper extremity and lower extremity focality. Possible causes include sleep deprivation and change in alcohol use. There has been no fever. The fever has been present for less than 1 day. There were no medications administered prior to arrival.    Past Medical History:  Diagnosis Date  . Eczema   . Hypertension   . Migraine headache     Patient Active Problem List   Diagnosis Date Noted  . Closed compression fracture of L2 lumbar vertebra, initial encounter (HCC) 08/26/2017  . Behavior change due to substance use 02/27/2017  . Alcohol use disorder, severe, dependence (HCC) 02/27/2017    History reviewed. No pertinent surgical history.      Home Medications    Prior to Admission medications   Medication Sig Start Date End Date Taking? Authorizing Provider  aspirin EC 81 MG tablet Take 81 mg by mouth daily.   Yes [provider]  ibuprofen (ADVIL,MOTRIN) 200 MG tablet Take 200-800 mg by mouth every 6 (six) hours as needed for moderate  pain.   Yes [provider]  acetaminophen (TYLENOL) 325 MG tablet Take 3 tablets (975 mg total) by mouth every 6 (six) hours as needed for mild pain. Patient not taking: Reported on 04/10/2018 08/27/17   Barnetta Chapel, PA-C  docusate sodium (COLACE) 100 MG capsule Take 1 capsule (100 mg total) by mouth 2 (two) times daily. Patient not taking: Reported on 04/10/2018 08/27/17   Barnetta Chapel, PA-C  lisinopril (PRINIVIL,ZESTRIL) 5 MG tablet Take 1 tablet (5 mg total) by mouth daily. 04/10/18 05/10/18  Teletha Petrea, DO  methocarbamol (ROBAXIN) 500 MG tablet Take 1 tablet (500 mg total) by mouth every 8 (eight) hours as needed for muscle spasms. Patient not taking: Reported on 04/10/2018 08/27/17   Barnetta Chapel, PA-C  Oxycodone HCl 10 MG TABS Take 1 tablet (10 mg total) by mouth every 4 (four) hours as needed. Patient not taking: Reported on 04/10/2018 08/27/17   Barnetta Chapel, PA-C    Family History No family history on file.  Social History Social History   Tobacco Use  . Smoking status: Current Every Day Smoker    Packs/day: 1.00    Types: Cigarettes  . Smokeless tobacco: Never Used  Substance Use Topics  . Alcohol use: Yes    Comment: every day depends on day of week as to how much  . Drug use: Yes    Types: Marijuana    Comment: every day     Allergies   Mushroom extract complex   Review of Systems Review of Systems  Constitutional: Negative for chills and fever.  HENT: Negative for ear pain and sore throat.   Eyes: Negative for pain and visual disturbance.  Respiratory: Negative for cough and shortness of breath.   Cardiovascular: Negative for chest pain and palpitations.  Gastrointestinal: Negative for abdominal pain, diarrhea and vomiting.  Genitourinary: Negative for bladder incontinence, dysuria and hematuria.  Musculoskeletal: Negative for arthralgias and back pain.  Skin: Negative for color change and rash.  Neurological: Positive for seizures and  loss of consciousness. Negative for syncope and speech difficulty.  Psychiatric/Behavioral: Positive for confusion.  All other systems reviewed and are negative.    Physical Exam Updated Vital Signs  ED Triage Vitals  Enc Vitals Group     BP 04/10/18 1959 (!) 154/109     Pulse Rate 04/10/18 1959 99     Resp 04/10/18 1959 20     Temp 04/10/18 1959 99.4 F (37.4 C)     Temp Source 04/10/18 1959 Oral     SpO2 04/10/18 1959 92 %     Weight --      Height --      Head Circumference --      Peak Flow --      Pain Score 04/10/18 1954 0     Pain Loc --      Pain Edu? --      Excl. in GC? --     Physical Exam  Constitutional: He is oriented to person, place, and time. He appears well-developed and well-nourished.  HENT:  Head: Normocephalic and atraumatic.  Eyes: Pupils are equal, round, and reactive to light. Conjunctivae and EOM are normal.  Neck: Normal range of motion. Neck supple.  Cardiovascular: Normal rate, regular rhythm, normal heart sounds and intact distal pulses.  No murmur heard. Pulmonary/Chest: Effort normal and breath sounds normal. No respiratory distress.  Abdominal: Soft. There is no tenderness.  Musculoskeletal: Normal range of motion. He exhibits no edema.  Neurological: He is alert and oriented to person, place, and time. No cranial nerve deficit or sensory deficit. He exhibits normal muscle tone. Coordination normal.  5+/5 strength, normal sensation, no drift, normal gait, normal finger to nose finger  Skin: Skin is warm and dry.  Psychiatric: He has a normal mood and affect.  Nursing note and vitals reviewed.    ED Treatments / Results  Labs (all labs ordered are listed, but only abnormal results are displayed) Labs Reviewed  CBC - Abnormal; Notable for the following components:      Result Value   Hemoglobin 17.6 (*)    Platelets 124 (*)    All other components within normal limits  BASIC METABOLIC PANEL - Abnormal; Notable for the following  components:   Glucose, Bld 129 (*)    Creatinine, Ser 1.25 (*)    All other components within normal limits    EKG EKG Interpretation  Date/Time:  Wednesday April 10 2018 19:57:52 EST Ventricular Rate:  102 PR Interval:    QRS Duration: 95 QT Interval:  350 QTC Calculation: 456 R Axis:   84 Text Interpretation:  Sinus tachycardia Left atrial enlargement Confirmed by Virgina Norfolk [phone]) on 04/10/2018 8:01:02 PM   Radiology Ct Head Wo Contrast  Result Date: 04/10/2018 CLINICAL DATA:  Altered level of consciousness EXAM: CT HEAD WITHOUT CONTRAST TECHNIQUE: Contiguous axial images were obtained from the base of the skull through the vertex without intravenous contrast. COMPARISON:  08/26/2017 FINDINGS: Brain: No evidence of acute infarction, hemorrhage, hydrocephalus, extra-axial collection or  mass lesion/mass effect. Vascular: No hyperdense vessel or unexpected calcification. Skull: No osseous abnormality. Sinuses/Orbits: Mucous retention cyst of the left maxillary sinus. Visualized mastoid sinuses are clear. Visualized orbits demonstrate no focal abnormality. Other: None IMPRESSION: No acute intracranial pathology. Electronically Signed   By: Elige KoHetal  Patel   On: 04/10/2018 20:34    Procedures Procedures (including critical care time)  Medications Ordered in ED Medications - No data to display   Initial Impression / Assessment and Plan / ED Course  I have reviewed the triage vital signs and the nursing notes.  Pertinent labs & imaging results that were available during my care of the patient were reviewed by me and considered in my medical decision making (see chart for details).     Patrick [name] is a 42 year old male with no significant medical history who presents to the ED after seizure-like activity.  Patient with unremarkable vitals.  No fever.  Patient with apparent full body shaking prior to arrival witnessed by family member.  Patient was nonresponsive during the  event.  Had period of confusion afterwards.  Patient is now back at his baseline.  Patient is a daily alcohol user and had not had a beverage yet today.  Did have alcohol yesterday.  Low concern for alcohol withdrawal symptoms.  Patient with normal blood sugar.  Patient denies any chest pain, shortness of breath.  Patient had EKG that showed sinus rhythm.  No signs of ischemic changes.  Normal intervals.  Doubt arrhythmia.  Patient does use of marijuana but no other drugs.  Patient used to be on blood pressure medicine called lisinopril but no longer on it.  Will give new prescription for lisinopril.  Patient had CT of the head that was unremarkable.  No significant electrolyte abnormality, kidney injury, leukocytosis, anemia.  Patient neurologically intact on exam.  No concern for stroke.  No concern for meningitis.  Patient has not been eating or sleeping well.  Patient appears to have multiple risk factors for lowering of seizure threshold with alcohol use, poor sleep.  Patient with no history of seizures and possibly for seizure.  Was given education about this.  Patient does not drive.  But told him not to drive until he is cleared by neurology.  Given information to follow-up with neurology for EEG and MRI.  Told him to return to the ED if symptoms worsen.  Patient given prescription for lisinopril.  Given information to follow-up with primary care doctor and discharged in ED good condition.  This chart was dictated using voice recognition software.  Despite best efforts to proofread,  errors can occur which can change the documentation meaning.   Final Clinical Impressions(s) / ED Diagnoses   Final diagnoses:  Seizure-like activity [facility](HCC)    ED Discharge Orders         Ordered    lisinopril (PRINIVIL,ZESTRIL) 5 MG tablet  Daily     04/10/18 2221           Virgina NorfolkCuratolo, Ysmael Hires, DO 04/10/18 2223

## 2018-04-10 NOTE — ED Triage Notes (Signed)
Patient arrived with EMS from home ,witnessed grand mal seizure by family this evening , alert and oriented at arrival , pt. unable to recall incident , respirations unlabored , CBG= 113 , denies pain or injury.

## 2018-04-10 NOTE — Discharge Instructions (Signed)
No driving until cleared by physician, no operating heavy machinery.

## 2018-09-23 ENCOUNTER — Emergency Department (HOSPITAL_COMMUNITY): Payer: Self-pay

## 2018-09-23 ENCOUNTER — Other Ambulatory Visit: Payer: Self-pay

## 2018-09-23 ENCOUNTER — Emergency Department (HOSPITAL_COMMUNITY)
Admission: EM | Admit: 2018-09-23 | Discharge: 2018-09-23 | Disposition: A | Discharge status: Home / Self Care | Payer: Self-pay | Attending: Emergency Medicine | Admitting: Emergency Medicine

## 2018-09-23 ENCOUNTER — Encounter (HOSPITAL_COMMUNITY): Payer: Self-pay | Admitting: *Deleted

## 2018-09-23 DIAGNOSIS — M546 Pain in thoracic spine: Secondary | ICD-10-CM | POA: Insufficient documentation

## 2018-09-23 DIAGNOSIS — Y939 Activity, unspecified: Secondary | ICD-10-CM | POA: Insufficient documentation

## 2018-09-23 DIAGNOSIS — F17210 Nicotine dependence, cigarettes, uncomplicated: Secondary | ICD-10-CM | POA: Insufficient documentation

## 2018-09-23 DIAGNOSIS — Z7982 Long term (current) use of aspirin: Secondary | ICD-10-CM | POA: Insufficient documentation

## 2018-09-23 DIAGNOSIS — W19XXXA Unspecified fall, initial encounter: Secondary | ICD-10-CM

## 2018-09-23 DIAGNOSIS — I10 Essential (primary) hypertension: Secondary | ICD-10-CM | POA: Insufficient documentation

## 2018-09-23 DIAGNOSIS — M7021 Olecranon bursitis, right elbow: Secondary | ICD-10-CM | POA: Insufficient documentation

## 2018-09-23 DIAGNOSIS — M25421 Effusion, right elbow: Secondary | ICD-10-CM | POA: Insufficient documentation

## 2018-09-23 DIAGNOSIS — M25562 Pain in left knee: Secondary | ICD-10-CM | POA: Insufficient documentation

## 2018-09-23 NOTE — ED Notes (Signed)
Patient verbalizes understanding of discharge instructions . Opportunity for questions and answers were provided . Armband removed by staff ,Pt discharged from ED. W/C  offered at D/C  and Declined W/C at D/C and was escorted to lobby by RN.  

## 2018-09-23 NOTE — ED Triage Notes (Signed)
PT slipped last Thursday and landed on RT elbow and LT knee. Pt reports pain to Lt knee and increased pain to RT elbow.  Pt also reports swelling to RT elbow for several weeks.

## 2018-09-23 NOTE — Discharge Instructions (Signed)
Continue taking home medications for pain.  Continue taking ibuprofen and Tylenol. Use ice to help with pain and swelling. Contact your orthopedic doctor for further evaluation. Your blood pressure was elevated today.  You should follow-up with your primary care doctor for further management of your blood pressure. If you do not have a primary care doctor, you can follow up at the clinic listed below. Return to the emergency room if you develop numbness, severe worsening symptoms, any new or concerning symptoms.

## 2018-09-23 NOTE — ED Provider Notes (Signed)
[facility] EMERGENCY DEPARTMENT Provider Note   CSN: [id number] Arrival date & time: 09/23/18  1352    History   Chief Complaint Chief Complaint  Patient presents with  . Knee Pain    HPI Patrick [name] is a 43 y.o. male senting for evaluation of left knee, right elbow, and left thorax pain.  Patient states he fell 3 days ago, slipping on some leaves, landing on his left knee, right elbow.  He reports acute onset pain.  Patient also reports at that time he started develop left thoracic back and side pain.  He has a history of a L2 fracture last year.  Patient states prior to the fall, he had had several weeks of swelling in his right elbow, which has been nontender.  Since the fall, elbow and knee are both tender.  Patient is able to ambulate without significant tenderness, but he does report tenderness with palpation.  He denies hitting his head or loss of consciousness.  He denies neck or midline back pain.  He denies hip pain.  He has been taking ibuprofen and Mobic with mild improvement of his symptoms, he has not taken anything else.  He denies numbness or tingling.  He denies radiation of the pain.     HPI  Past Medical History:  Diagnosis Date  . Eczema   . Hypertension   . Migraine headache     Patient Active Problem List   Diagnosis Date Noted  . Closed compression fracture of L2 lumbar vertebra, initial encounter (HCC) 08/26/2017  . Behavior change due to substance use 02/27/2017  . Alcohol use disorder, severe, dependence (HCC) 02/27/2017    History reviewed. No pertinent surgical history.      Home Medications    Prior to Admission medications   Medication Sig Start Date End Date Taking? Authorizing Provider  acetaminophen (TYLENOL) 325 MG tablet Take 3 tablets (975 mg total) by mouth every 6 (six) hours as needed for mild pain. Patient not taking: Reported on 04/10/2018 08/27/17   Barnetta Chapel, PA-C  aspirin EC 81 MG tablet Take 81 mg by  mouth daily.    [provider]  docusate sodium (COLACE) 100 MG capsule Take 1 capsule (100 mg total) by mouth 2 (two) times daily. Patient not taking: Reported on 04/10/2018 08/27/17   Barnetta Chapel, PA-C  ibuprofen (ADVIL,MOTRIN) 200 MG tablet Take 200-800 mg by mouth every 6 (six) hours as needed for moderate pain.    [provider]  lisinopril (PRINIVIL,ZESTRIL) 5 MG tablet Take 1 tablet (5 mg total) by mouth daily. 04/10/18 05/10/18  Curatolo, Adam, DO  methocarbamol (ROBAXIN) 500 MG tablet Take 1 tablet (500 mg total) by mouth every 8 (eight) hours as needed for muscle spasms. Patient not taking: Reported on 04/10/2018 08/27/17   Barnetta Chapel, PA-C  Oxycodone HCl 10 MG TABS Take 1 tablet (10 mg total) by mouth every 4 (four) hours as needed. Patient not taking: Reported on 04/10/2018 08/27/17   Barnetta Chapel, PA-C    Family History History reviewed. No pertinent family history.  Social History Social History   Tobacco Use  . Smoking status: Current Every Day Smoker    Packs/day: 1.00    Types: Cigarettes  . Smokeless tobacco: Never Used  Substance Use Topics  . Alcohol use: Yes    Comment: every day depends on day of week as to how much  . Drug use: Yes    Types: Marijuana    Comment: every  day     Allergies   Mushroom extract complex   Review of Systems Review of Systems  Musculoskeletal: Positive for arthralgias, back pain and joint swelling.  All other systems reviewed and are negative.    Physical Exam Updated Vital Signs BP (S) (!) 172/129 (BP Location: Right Arm) Comment: PT is out of BP meds  Pulse 88   Temp 98.2 F (36.8 C) (Oral)   Resp 16   Ht 5\' 11"  (1.803 m)   Wt 81.6 kg   SpO2 99%   BMI 25.10 kg/m   Physical Exam Vitals signs and nursing note reviewed.  Constitutional:      General: He is not in acute distress.    Appearance: He is well-developed.     Comments: Appears nontoxic  HENT:     Head: Normocephalic and  atraumatic.  Eyes:     Extraocular Movements: Extraocular movements intact.     Conjunctiva/sclera: Conjunctivae normal.     Pupils: Pupils are equal, round, and reactive to light.  Neck:     Musculoskeletal: Normal range of motion and neck supple.  Cardiovascular:     Rate and Rhythm: Normal rate and regular rhythm.  Pulmonary:     Effort: Pulmonary effort is normal. No respiratory distress.     Breath sounds: Normal breath sounds. No wheezing.  Abdominal:     General: There is no distension.     Palpations: Abdomen is soft. There is no mass.     Tenderness: There is no abdominal tenderness. There is no guarding or rebound.  Musculoskeletal: Normal range of motion.        General: Swelling and tenderness present.     Comments: Swelling of the right elbow over the olecranon, consistent with bursitis.  Tenderness palpation of the lateral epicondyles and radial head.  Full active range of motion of the elbow without difficulty.  Radial pulses intact.  Grip strength intact. Minimal swelling of the anterior left knee.  Tenderness palpation over the patella.  No tenderness palpation over the joint line or popliteal area.  Full active range of motion of the knee without pain.  Patient able to ambulate without pain. Tenderness palpation of left thoracic back musculature and left lateral ribs.  No tenderness palpation of the anterior chest.  No tenderness palpation of midline spine.  No step-offs or deformities.  Skin:    General: Skin is warm and dry.     Capillary Refill: Capillary refill takes less than 2 seconds.  Neurological:     Mental Status: He is alert and oriented to person, place, and time.      ED Treatments / Results  Labs (all labs ordered are listed, but only abnormal results are displayed) Labs Reviewed - No data to display  EKG None  Radiology Dg Ribs Unilateral W/chest Left  Result Date: 09/23/2018 CLINICAL DATA:  43 year old male with a history of fall 5 days ago  EXAM: LEFT RIBS AND CHEST - 3+ VIEW COMPARISON:  None. FINDINGS: No fracture or other bone lesions are seen involving the ribs. There is no evidence of pneumothorax or pleural effusion. Both lungs are clear. Heart size and mediastinal contours are within normal limits. IMPRESSION: Negative Electronically Signed   By: Gilmer Mor D.O.   On: 09/23/2018 15:32   Dg Elbow Complete Right  Result Date: 09/23/2018 CLINICAL DATA:  Fall, pain EXAM: RIGHT ELBOW - COMPLETE 3+ VIEW COMPARISON:  12/05/2017 FINDINGS: No definite fracture or dislocation of the right elbow. There  is a small, nonspecific right elbow joint effusion, suspicious for nondisplaced radial head or neck fracture. There is extensive soft tissue edema overlying the olecranon. IMPRESSION: No definite fracture or dislocation of the right elbow. There is a small, nonspecific right elbow joint effusion, suspicious for nondisplaced radial head or neck fracture. There is extensive soft tissue edema overlying the olecranon. Electronically Signed   By: Lauralyn PrimesAlex  Bibbey M.D.   On: 09/23/2018 15:30   Dg Knee Complete 4 Views Left  Result Date: 09/23/2018 CLINICAL DATA:  43 year old male with a history of fall EXAM: LEFT KNEE - COMPLETE 4+ VIEW COMPARISON:  None. FINDINGS: No evidence of fracture, dislocation, or joint effusion. No evidence of arthropathy or other focal bone abnormality. Soft tissues are unremarkable. IMPRESSION: Negative for acute bony abnormality Electronically Signed   By: Gilmer MorJaime  Wagner D.O.   On: 09/23/2018 15:25    Procedures Procedures (including critical care time)  Medications Ordered in ED Medications - No data to display   Initial Impression / Assessment and Plan / ED Course  I have reviewed the triage vital signs and the nursing notes.  Pertinent labs & imaging results that were available during my care of the patient were reviewed by me and considered in my medical decision making (see chart for details).         Patient presenting for evaluation of right elbow, left knee, and left backslash works pain after fall.  Physical exam reassuring, he is neurovascularly intact.  Patient had previous swelling over the olecranon, and physical exam consistent with left bursitis.  However, as he has no pain after the fall, will obtain x-rays of the elbow, knee, and ribs.  X-rays viewed interpreted by me, no fracture dislocation of the ribs or the knee.  Per radiology, there is a small joint effusion within the right elbow, suspicious for a nondisplaced radial head fracture.  Additionally, patient has swelling consistent with bursitis.  Discussed findings with patient.  Discussed importance of immobilization with a radial head fracture, as such will not place in a sling or splint.  Encouraged continued pain control with Tylenol and ibuprofen.  Encouraged follow-up with orthopedics for further management.  At this time, patient appears safe for discharge. Return precautions given.  Patient states he understands agrees plan.   Final Clinical Impressions(s) / ED Diagnoses   Final diagnoses:  Fall, initial encounter  Olecranon bursitis of right elbow  Elbow joint effusion, right  Acute pain of left knee  Acute left-sided thoracic back pain    ED Discharge Orders    None       Alveria ApleyCaccavale, Myria Steenbergen, PA-C 09/23/18 2056    Cathren LaineSteinl, Kevin, MD 09/24/18 1421

## 2018-10-26 ENCOUNTER — Emergency Department (HOSPITAL_COMMUNITY)
Admission: EM | Admit: 2018-10-26 | Discharge: 2018-10-26 | Discharge status: Against Medical Advice | Payer: Self-pay | Attending: Emergency Medicine | Admitting: Emergency Medicine

## 2018-10-26 DIAGNOSIS — Z5321 Procedure and treatment not carried out due to patient leaving prior to being seen by health care provider: Secondary | ICD-10-CM | POA: Insufficient documentation

## 2018-10-26 DIAGNOSIS — Z0289 Encounter for other administrative examinations: Secondary | ICD-10-CM | POA: Insufficient documentation

## 2018-10-26 NOTE — ED Triage Notes (Signed)
Per EMS, patient from home, reports binge drinking x9 days. Requesting detox. Reports hx seizures with detox. Patient is anxious. Denies SI. States last drink x10 minutes prior to EMS arrival.

## 2018-10-26 NOTE — ED Notes (Addendum)
Prior to completion of triage, patient ambulatory out of ED. States "my ride is on the way."

## 2018-10-26 NOTE — ED Notes (Signed)
Bed: WTR5 Expected date:  Expected time:  Means of arrival:  Comments: 

## 2019-08-28 ENCOUNTER — Inpatient Hospital Stay (HOSPITAL_COMMUNITY)
Admission: EM | Admit: 2019-08-28 | Discharge: 2019-09-05 | DRG: 871 | Disposition: A | Discharge status: Home / Self Care | Payer: Self-pay | Attending: Family Medicine | Admitting: Family Medicine

## 2019-08-28 ENCOUNTER — Encounter (HOSPITAL_COMMUNITY): Payer: Self-pay

## 2019-08-28 ENCOUNTER — Emergency Department (HOSPITAL_COMMUNITY): Payer: Self-pay

## 2019-08-28 ENCOUNTER — Other Ambulatory Visit: Payer: Self-pay

## 2019-08-28 DIAGNOSIS — Z791 Long term (current) use of non-steroidal anti-inflammatories (NSAID): Secondary | ICD-10-CM

## 2019-08-28 DIAGNOSIS — K921 Melena: Secondary | ICD-10-CM | POA: Diagnosis not present

## 2019-08-28 DIAGNOSIS — R52 Pain, unspecified: Secondary | ICD-10-CM

## 2019-08-28 DIAGNOSIS — D61818 Other pancytopenia: Secondary | ICD-10-CM | POA: Diagnosis present

## 2019-08-28 DIAGNOSIS — N179 Acute kidney failure, unspecified: Secondary | ICD-10-CM | POA: Diagnosis present

## 2019-08-28 DIAGNOSIS — F10229 Alcohol dependence with intoxication, unspecified: Secondary | ICD-10-CM | POA: Diagnosis present

## 2019-08-28 DIAGNOSIS — E860 Dehydration: Secondary | ICD-10-CM | POA: Diagnosis present

## 2019-08-28 DIAGNOSIS — D6959 Other secondary thrombocytopenia: Secondary | ICD-10-CM | POA: Diagnosis present

## 2019-08-28 DIAGNOSIS — R0682 Tachypnea, not elsewhere classified: Secondary | ICD-10-CM | POA: Diagnosis not present

## 2019-08-28 DIAGNOSIS — Z79899 Other long term (current) drug therapy: Secondary | ICD-10-CM

## 2019-08-28 DIAGNOSIS — R6521 Severe sepsis with septic shock: Secondary | ICD-10-CM | POA: Diagnosis present

## 2019-08-28 DIAGNOSIS — M25551 Pain in right hip: Secondary | ICD-10-CM | POA: Diagnosis present

## 2019-08-28 DIAGNOSIS — R7401 Elevation of levels of liver transaminase levels: Secondary | ICD-10-CM

## 2019-08-28 DIAGNOSIS — A4101 Sepsis due to Methicillin susceptible Staphylococcus aureus: Principal | ICD-10-CM | POA: Diagnosis present

## 2019-08-28 DIAGNOSIS — A419 Sepsis, unspecified organism: Secondary | ICD-10-CM

## 2019-08-28 DIAGNOSIS — W07XXXA Fall from chair, initial encounter: Secondary | ICD-10-CM | POA: Diagnosis present

## 2019-08-28 DIAGNOSIS — R296 Repeated falls: Secondary | ICD-10-CM | POA: Diagnosis present

## 2019-08-28 DIAGNOSIS — E876 Hypokalemia: Secondary | ICD-10-CM

## 2019-08-28 DIAGNOSIS — J189 Pneumonia, unspecified organism: Secondary | ICD-10-CM | POA: Diagnosis present

## 2019-08-28 DIAGNOSIS — L309 Dermatitis, unspecified: Secondary | ICD-10-CM | POA: Diagnosis present

## 2019-08-28 DIAGNOSIS — F10232 Alcohol dependence with withdrawal with perceptual disturbance: Secondary | ICD-10-CM

## 2019-08-28 DIAGNOSIS — B182 Chronic viral hepatitis C: Secondary | ICD-10-CM | POA: Diagnosis present

## 2019-08-28 DIAGNOSIS — K3189 Other diseases of stomach and duodenum: Secondary | ICD-10-CM | POA: Diagnosis present

## 2019-08-28 DIAGNOSIS — G43909 Migraine, unspecified, not intractable, without status migrainosus: Secondary | ICD-10-CM | POA: Diagnosis present

## 2019-08-28 DIAGNOSIS — G92 Toxic encephalopathy: Secondary | ICD-10-CM | POA: Diagnosis present

## 2019-08-28 DIAGNOSIS — F10231 Alcohol dependence with withdrawal delirium: Secondary | ICD-10-CM | POA: Diagnosis present

## 2019-08-28 DIAGNOSIS — K709 Alcoholic liver disease, unspecified: Secondary | ICD-10-CM | POA: Diagnosis present

## 2019-08-28 DIAGNOSIS — Z7982 Long term (current) use of aspirin: Secondary | ICD-10-CM

## 2019-08-28 DIAGNOSIS — R9431 Abnormal electrocardiogram [ECG] [EKG]: Secondary | ICD-10-CM | POA: Diagnosis present

## 2019-08-28 DIAGNOSIS — R6520 Severe sepsis without septic shock: Secondary | ICD-10-CM | POA: Diagnosis present

## 2019-08-28 DIAGNOSIS — D62 Acute posthemorrhagic anemia: Secondary | ICD-10-CM | POA: Diagnosis present

## 2019-08-28 DIAGNOSIS — R7881 Bacteremia: Secondary | ICD-10-CM

## 2019-08-28 DIAGNOSIS — K920 Hematemesis: Secondary | ICD-10-CM | POA: Diagnosis present

## 2019-08-28 DIAGNOSIS — K760 Fatty (change of) liver, not elsewhere classified: Secondary | ICD-10-CM | POA: Diagnosis present

## 2019-08-28 DIAGNOSIS — D638 Anemia in other chronic diseases classified elsewhere: Secondary | ICD-10-CM | POA: Diagnosis present

## 2019-08-28 DIAGNOSIS — I10 Essential (primary) hypertension: Secondary | ICD-10-CM | POA: Diagnosis present

## 2019-08-28 DIAGNOSIS — Z20822 Contact with and (suspected) exposure to covid-19: Secondary | ICD-10-CM | POA: Diagnosis present

## 2019-08-28 DIAGNOSIS — R0602 Shortness of breath: Secondary | ICD-10-CM

## 2019-08-28 DIAGNOSIS — R0683 Snoring: Secondary | ICD-10-CM | POA: Diagnosis not present

## 2019-08-28 DIAGNOSIS — F17210 Nicotine dependence, cigarettes, uncomplicated: Secondary | ICD-10-CM | POA: Diagnosis present

## 2019-08-28 DIAGNOSIS — E871 Hypo-osmolality and hyponatremia: Secondary | ICD-10-CM | POA: Diagnosis present

## 2019-08-28 DIAGNOSIS — E878 Other disorders of electrolyte and fluid balance, not elsewhere classified: Secondary | ICD-10-CM | POA: Diagnosis present

## 2019-08-28 DIAGNOSIS — B958 Unspecified staphylococcus as the cause of diseases classified elsewhere: Secondary | ICD-10-CM

## 2019-08-28 DIAGNOSIS — Z23 Encounter for immunization: Secondary | ICD-10-CM

## 2019-08-28 DIAGNOSIS — Z79891 Long term (current) use of opiate analgesic: Secondary | ICD-10-CM

## 2019-08-28 DIAGNOSIS — K7010 Alcoholic hepatitis without ascites: Secondary | ICD-10-CM

## 2019-08-28 DIAGNOSIS — Z91018 Allergy to other foods: Secondary | ICD-10-CM

## 2019-08-28 DIAGNOSIS — F1010 Alcohol abuse, uncomplicated: Secondary | ICD-10-CM

## 2019-08-28 DIAGNOSIS — K7200 Acute and subacute hepatic failure without coma: Secondary | ICD-10-CM | POA: Diagnosis present

## 2019-08-28 DIAGNOSIS — F10239 Alcohol dependence with withdrawal, unspecified: Secondary | ICD-10-CM | POA: Diagnosis present

## 2019-08-28 DIAGNOSIS — R569 Unspecified convulsions: Secondary | ICD-10-CM | POA: Diagnosis present

## 2019-08-28 DIAGNOSIS — M25561 Pain in right knee: Secondary | ICD-10-CM | POA: Diagnosis present

## 2019-08-28 DIAGNOSIS — F10939 Alcohol use, unspecified with withdrawal, unspecified: Secondary | ICD-10-CM | POA: Diagnosis present

## 2019-08-28 LAB — CBC WITH DIFFERENTIAL/PLATELET
Abs Immature Granulocytes: 0.56 10*3/uL — ABNORMAL HIGH (ref 0.00–0.07)
Basophils Absolute: 0 10*3/uL (ref 0.0–0.1)
Basophils Relative: 0 %
Eosinophils Absolute: 0 10*3/uL (ref 0.0–0.5)
Eosinophils Relative: 0 %
HCT: 33.4 % — ABNORMAL LOW (ref 39.0–52.0)
Hemoglobin: 11.9 g/dL — ABNORMAL LOW (ref 13.0–17.0)
Immature Granulocytes: 7 %
Lymphocytes Relative: 16 %
Lymphs Abs: 1.3 10*3/uL (ref 0.7–4.0)
MCH: 35.3 pg — ABNORMAL HIGH (ref 26.0–34.0)
MCHC: 35.6 g/dL (ref 30.0–36.0)
MCV: 99.1 fL (ref 80.0–100.0)
Monocytes Absolute: 0.4 10*3/uL (ref 0.1–1.0)
Monocytes Relative: 5 %
Neutro Abs: 6.1 10*3/uL (ref 1.7–7.7)
Neutrophils Relative %: 72 %
Platelets: 17 10*3/uL — CL (ref 150–400)
RBC: 3.37 MIL/uL — ABNORMAL LOW (ref 4.22–5.81)
RDW: 12.6 % (ref 11.5–15.5)
WBC: 8.4 10*3/uL (ref 4.0–10.5)
nRBC: 0.4 % — ABNORMAL HIGH (ref 0.0–0.2)

## 2019-08-28 LAB — COMPREHENSIVE METABOLIC PANEL
ALT: 106 U/L — ABNORMAL HIGH (ref 0–44)
AST: 239 U/L — ABNORMAL HIGH (ref 15–41)
Albumin: 3.2 g/dL — ABNORMAL LOW (ref 3.5–5.0)
Alkaline Phosphatase: 94 U/L (ref 38–126)
Anion gap: 23 — ABNORMAL HIGH (ref 5–15)
BUN: 14 mg/dL (ref 6–20)
CO2: 22 mmol/L (ref 22–32)
Calcium: 7.3 mg/dL — ABNORMAL LOW (ref 8.9–10.3)
Chloride: 85 mmol/L — ABNORMAL LOW (ref 98–111)
Creatinine, Ser: 1.14 mg/dL (ref 0.61–1.24)
GFR calc Af Amer: >60 mL/min (ref >60)
GFR calc non Af Amer: >60 mL/min (ref >60)
Glucose, Bld: 113 mg/dL — ABNORMAL HIGH (ref 70–99)
Potassium: <2 mmol/L — CL (ref 3.5–5.1)
Sodium: 130 mmol/L — ABNORMAL LOW (ref 135–145)
Total Bilirubin: 5.3 mg/dL — ABNORMAL HIGH (ref 0.3–1.2)
Total Protein: 6.5 g/dL (ref 6.5–8.1)

## 2019-08-28 LAB — URINALYSIS, ROUTINE W REFLEX MICROSCOPIC
Bacteria, UA: NONE SEEN
Bilirubin Urine: NEGATIVE
Glucose, UA: NEGATIVE mg/dL
Ketones, ur: NEGATIVE mg/dL
Leukocytes,Ua: NEGATIVE
Nitrite: NEGATIVE
Protein, ur: 30 mg/dL — AB
Specific Gravity, Urine: 1.012 (ref 1.005–1.030)
pH: 6 (ref 5.0–8.0)

## 2019-08-28 LAB — RESPIRATORY PANEL BY RT PCR (FLU A&B, COVID)
Influenza A by PCR: NEGATIVE
Influenza B by PCR: NEGATIVE
SARS Coronavirus 2 by RT PCR: NEGATIVE

## 2019-08-28 LAB — APTT: aPTT: 36 seconds (ref 24–36)

## 2019-08-28 LAB — PROTIME-INR
INR: 1.3 — ABNORMAL HIGH (ref 0.8–1.2)
Prothrombin Time: 16 seconds — ABNORMAL HIGH (ref 11.4–15.2)

## 2019-08-28 LAB — BLOOD GAS, VENOUS
Acid-Base Excess: 1.3 mmol/L (ref 0.0–2.0)
Bicarbonate: 24.2 mmol/L (ref 20.0–28.0)
O2 Saturation: 48.9 %
Patient temperature: 98.6
pCO2, Ven: 33.2 mmHg — ABNORMAL LOW (ref 44.0–60.0)
pH, Ven: 7.476 — ABNORMAL HIGH (ref 7.250–7.430)

## 2019-08-28 LAB — ETHANOL: Alcohol, Ethyl (B): 76 mg/dL — ABNORMAL HIGH (ref <10)

## 2019-08-28 LAB — MAGNESIUM: Magnesium: 1.2 mg/dL — ABNORMAL LOW (ref 1.7–2.4)

## 2019-08-28 LAB — LIPASE, BLOOD: Lipase: 67 U/L — ABNORMAL HIGH (ref 11–51)

## 2019-08-28 LAB — LACTIC ACID, PLASMA
Lactic Acid, Venous: >11 mmol/L (ref 0.5–1.9)
Lactic Acid, Venous: 9.7 mmol/L (ref 0.5–1.9)

## 2019-08-28 MED ORDER — POTASSIUM CHLORIDE 10 MEQ/100ML IV SOLN
10.0000 meq | Freq: Once | INTRAVENOUS | Status: AC
  Administered 2019-08-28: 10 meq via INTRAVENOUS
  Filled 2019-08-28: qty 100

## 2019-08-28 MED ORDER — SODIUM CHLORIDE (PF) 0.9 % IJ SOLN
INTRAMUSCULAR | Status: AC
  Filled 2019-08-28: qty 50

## 2019-08-28 MED ORDER — CHLORDIAZEPOXIDE HCL 25 MG PO CAPS
25.0000 mg | ORAL_CAPSULE | Freq: Once | ORAL | Status: AC
  Administered 2019-08-28: 16:00:00 25 mg via ORAL
  Filled 2019-08-28: qty 1

## 2019-08-28 MED ORDER — SENNOSIDES-DOCUSATE SODIUM 8.6-50 MG PO TABS: 1.0000 | ORAL_TABLET | Freq: Every evening | ORAL | Status: DC | PRN

## 2019-08-28 MED ORDER — POTASSIUM CHLORIDE IN NACL 40-0.9 MEQ/L-% IV SOLN
INTRAVENOUS | Status: DC
  Administered 2019-08-29 (×2): 125 mL/h via INTRAVENOUS
  Filled 2019-08-28 (×5): qty 1000

## 2019-08-28 MED ORDER — LACTATED RINGERS IV BOLUS (SEPSIS)
1000.0000 mL | Freq: Once | INTRAVENOUS | Status: AC
  Administered 2019-08-28: 14:00:00 1000 mL via INTRAVENOUS

## 2019-08-28 MED ORDER — METRONIDAZOLE IN NACL 5-0.79 MG/ML-% IV SOLN
500.0000 mg | Freq: Once | INTRAVENOUS | Status: AC
  Administered 2019-08-28: 500 mg via INTRAVENOUS
  Filled 2019-08-28: qty 100

## 2019-08-28 MED ORDER — MAGNESIUM SULFATE 4 GM/100ML IV SOLN
4.0000 g | Freq: Once | INTRAVENOUS | Status: AC
  Administered 2019-08-28: 4 g via INTRAVENOUS
  Filled 2019-08-28: qty 100

## 2019-08-28 MED ORDER — ONDANSETRON HCL 4 MG/2ML IJ SOLN: 4.0000 mg | Freq: Four times a day (QID) | INTRAMUSCULAR | Status: DC | PRN

## 2019-08-28 MED ORDER — NICOTINE 21 MG/24HR TD PT24
21.0000 mg | MEDICATED_PATCH | Freq: Every day | TRANSDERMAL | Status: DC
  Administered 2019-08-29 – 2019-09-05 (×7): 21 mg via TRANSDERMAL
  Filled 2019-08-28 (×8): qty 1

## 2019-08-28 MED ORDER — LACTATED RINGERS IV SOLN: INTRAVENOUS | Status: DC

## 2019-08-28 MED ORDER — SODIUM CHLORIDE 0.9 % IV SOLN
2.0000 g | Freq: Once | INTRAVENOUS | Status: AC
  Administered 2019-08-28: 2 g via INTRAVENOUS
  Filled 2019-08-28: qty 2

## 2019-08-28 MED ORDER — THIAMINE HCL 100 MG/ML IJ SOLN
Freq: Once | INTRAVENOUS | Status: AC
  Filled 2019-08-28: qty 1000

## 2019-08-28 MED ORDER — ACETAMINOPHEN 650 MG RE SUPP: 650.0000 mg | Freq: Four times a day (QID) | RECTAL | Status: DC | PRN

## 2019-08-28 MED ORDER — LACTATED RINGERS IV BOLUS
1000.0000 mL | Freq: Once | INTRAVENOUS | Status: AC
  Administered 2019-08-28: 1000 mL via INTRAVENOUS

## 2019-08-28 MED ORDER — IOHEXOL 300 MG/ML  SOLN
100.0000 mL | Freq: Once | INTRAMUSCULAR | Status: AC | PRN
  Administered 2019-08-28: 100 mL via INTRAVENOUS

## 2019-08-28 MED ORDER — LACTATED RINGERS IV BOLUS (SEPSIS)
1000.0000 mL | Freq: Once | INTRAVENOUS | Status: AC
  Administered 2019-08-28 (×2): 1000 mL via INTRAVENOUS

## 2019-08-28 MED ORDER — LACTATED RINGERS IV BOLUS (SEPSIS)
500.0000 mL | Freq: Once | INTRAVENOUS | Status: AC
  Administered 2019-08-28: 14:00:00 500 mL via INTRAVENOUS

## 2019-08-28 MED ORDER — POTASSIUM CHLORIDE CRYS ER 20 MEQ PO TBCR
40.0000 meq | EXTENDED_RELEASE_TABLET | Freq: Once | ORAL | Status: AC
  Administered 2019-08-28: 40 meq via ORAL
  Filled 2019-08-28: qty 2

## 2019-08-28 MED ORDER — PANTOPRAZOLE SODIUM 40 MG IV SOLR
40.0000 mg | Freq: Once | INTRAVENOUS | Status: AC
  Administered 2019-08-28: 40 mg via INTRAVENOUS
  Filled 2019-08-28: qty 40

## 2019-08-28 MED ORDER — MAGNESIUM SULFATE 50 % IJ SOLN: 1.0000 g | Freq: Once | INTRAMUSCULAR | Status: DC

## 2019-08-28 MED ORDER — LORAZEPAM 2 MG/ML IJ SOLN
2.0000 mg | Freq: Once | INTRAMUSCULAR | Status: AC
  Administered 2019-08-29: 2 mg via INTRAVENOUS
  Filled 2019-08-28: qty 1

## 2019-08-28 MED ORDER — ACETAMINOPHEN 325 MG PO TABS
650.0000 mg | ORAL_TABLET | Freq: Four times a day (QID) | ORAL | Status: DC | PRN
  Administered 2019-08-29 – 2019-09-05 (×5): 650 mg via ORAL
  Filled 2019-08-28 (×5): qty 2

## 2019-08-28 MED ORDER — VANCOMYCIN HCL IN DEXTROSE 1-5 GM/200ML-% IV SOLN
1000.0000 mg | Freq: Once | INTRAVENOUS | Status: AC
  Administered 2019-08-28: 16:00:00 1000 mg via INTRAVENOUS
  Filled 2019-08-28: qty 200

## 2019-08-28 MED ORDER — ONDANSETRON HCL 4 MG PO TABS: 4.0000 mg | ORAL_TABLET | Freq: Four times a day (QID) | ORAL | Status: DC | PRN

## 2019-08-28 NOTE — ED Notes (Signed)
Date and time results received: 08/28/19 8:21 PM   Test: VBG PO2 Critical Value: less than reportable  Name of Provider Notified: Denyse Amass RN  Orders Received? Or Actions Taken?: Primary RN made aware

## 2019-08-28 NOTE — ED Notes (Signed)
Date and time results received: 08/28/19 1509  Test: Potassium  Critical Value: <2  Name of Provider Notified: Greta Doom PA

## 2019-08-28 NOTE — ED Provider Notes (Addendum)
Patient is a 44 year old male that was transferred to me at shift change from [facility].  Please see his note for further detail regarding this patient but to summarize patient has a significant history of alcohol abuse.  Patient notes he has become progressively weak and has had more falls in the last week.  He has been complaining of fever and has a T-max in the emergency department of 101.2 Fahrenheit.  He met sepsis protocol was worked up for this and is currently on broad-spectrum antibiotics.  Unknown source at this point.  CT with contrast of the abdomen and pelvis is pending.  His lactic acid initially was greater than 11.  Rona Ravens PA-C spoke to Dr. Lake Bells with critical care and he recommended we fluid resuscitate and repeat his lactic acid.  We should then reconsult him.  Physical Exam  BP (!) 143/108   Pulse (!) 112   Temp (!) 101.2 F (38.4 C) (Rectal)   Resp 20   SpO2 100%   Physical Exam Constitutional:      General: He is in acute distress.     Appearance: He is obese. He is ill-appearing and toxic-appearing. He is not diaphoretic.  HENT:     Head: Normocephalic and atraumatic.     Right Ear: External ear normal.     Left Ear: External ear normal.     Nose: Nose normal.     Mouth/Throat:     Mouth: Mucous membranes are dry.     Pharynx: Oropharynx is clear.  Eyes:     Extraocular Movements: Extraocular movements intact.  Cardiovascular:     Rate and Rhythm: Regular rhythm. Tachycardia present.     Pulses: Normal pulses.     Heart sounds: Normal heart sounds. No murmur. No friction rub. No gallop.   Pulmonary:     Effort: Pulmonary effort is normal. No respiratory distress.     Breath sounds: Normal breath sounds. No stridor. No wheezing, rhonchi or rales.  Chest:     Chest wall: No tenderness.  Abdominal:     Comments: Protuberant abdomen.  TTP noted along the right abdomen, particularly in the right upper quadrant.  Musculoskeletal:        General: Normal range of  motion.     Cervical back: Normal range of motion and neck supple.  Neurological:     General: No focal deficit present.     Mental Status: He is alert.  Psychiatric:        Mood and Affect: Mood is depressed.        Speech: Speech is delayed.    ED Course/Procedures     Procedures  MDM   5:25 PM patient handed off to me from [facility] Pao at shift change.  Patient is septic.  He has already been discussed with critical care.  They requested he be fluid resuscitated and repeat lactic acid which was initially greater than 11.  Has been given 2.5 L of fluid at this time.  CT of the abdomen and pelvis shows no acute abdominopelvic findings.  I have ordered a limited ultrasound of the abdomen.  Repeat lactic pending.  IV antibiotics in process.  Patient is saturating well.  He is tachycardic while I am in the room and has been tachycardic throughout his stay so far.  Blood pressure has improved and is 137/108 at most recent reading.  Will discuss with critical care once lactic acid is resulted.  5:49 PM repeat lactic acid is 9.7.  Will reconsult intensivist to discuss patient admission.  6:48 PM I spoke to Dr. Lake Bells he recommends this patient be admitted to hospitalist service.  Will do so.  7:36 PM I spoke to the hospitalist, Dr. Jens Som, who will have him admitted to the telemetry unit.  Note: Portions of this report may have been transcribed using voice recognition software. Every effort was made to ensure accuracy; however, inadvertent computerized transcription errors may be present.     Rayna Sexton, PA-C 08/28/19 2053    Rayna Sexton, PA-C 08/28/19 2147    Veryl Speak, MD 08/28/19 2242

## 2019-08-28 NOTE — ED Triage Notes (Addendum)
EMS reports from home, called out for Pt lying on ground outside of home covered in feces, Pt alert and oriented but acting erratic, Pt reports weakness x 1 week with increased falls and reduced ability ADLs. Pt currently intoxicated admits to drinking daily. Pt states he had "4" drinks today. Pt states he fell and struck head a week ago and has had trouble with balance, also states he has been vomiting blood.   BP 131/90 HR 120 RR 26 Sp02 98 RA Temp 100.2 CBG 220  20ga R Hand NS enroute

## 2019-08-28 NOTE — ED Notes (Signed)
Date and time results received: 08/28/19 1503  Test: Lactic Acid Critical Value: >11  Name of Provider Notified: Greta Doom PA

## 2019-08-28 NOTE — Consult Note (Signed)
NAME:  Patrick [name], MRN:  [id number], DOB:  07-29-1975, LOS: 0 ADMISSION DATE:  08/28/2019, CONSULTATION DATE:  4/29 REFERRING MD:  Laveda Norman, CHIEF COMPLAINT:  Fall, hip pain   Brief History   44 year old male with a past medical history significant for heavy alcohol abuse came to the [facility] long emergency room complaining of hip pain, nausea after a fall recently.  Has been drinking heavily and has had poor p.o. intake. PCCM consulted for markedly elevated lactic acid.  History of present illness   This is a 44 year old male who has a significant history of alcohol abuse who came to the [facility] long emergency room today in the setting of right hip pain after a fall several days ago.  He says that he was trying to stand up with a chair in the chair slipped and he fell on his right hip.  He says that he lay on that side for quite some time afterwards.  He has been drinking heavily recently as many as 6-10 beers and 6 shots of hard liquor a day.  He has not a regular meal recently.  He says that he does drink quite a bit of ginger ale and Pedialyte in addition to alcohol.  His last drink of alcohol was today around noon.  In the emergency room he was found to have remarkably abnormal lab work so pulmonary and critical care medicine was consulted predominantly in the setting of an elevated lactic acid.  He denies any abdominal pain though he says he has had a bit of nausea and vomiting recently.  He says that this was nonbilious, nonbloody dark brown vomitus 1 or 2 times prior to admission.  He denies diarrhea.  He denies chest pain or shortness of breath.  Past Medical History  Migraine headaches Hypertension eczema  Significant Hospital Events   4/29  Consults:  PCCM  Procedures:    Significant Diagnostic Tests:  4/29 CT abdomen pelvis: No abdominopelvic findings, marked diffuse hepatic steatosis, bilateral nonobstructive nephrolithiasis, chronic superior endplate compression deformity of L2,  aortic atherosclerosis  Micro Data:  4/29 SARS CoV2, Flu > negative  Antimicrobials:  4/29 cefepime >  4/29 vancomycin >  4/29 flagyl >   Interim history/subjective:    Objective   Blood pressure (!) 143/113, pulse (!) 113, temperature (!) 101.2 F (38.4 C), temperature source Rectal, resp. rate (!) 27, SpO2 100 %.        Intake/Output Summary (Last 24 hours) at 08/28/2019 1720 Last data filed at 08/28/2019 1638 Gross per 24 hour  Intake 2900 ml  Output --  Net 2900 ml   There were no vitals filed for this visit.  Examination:  General:  Dishevled, resting comfortably in bed HENT: NCAT OP clear PULM: CTA B, normal effort CV: Tachy, regular, no mgr GI: BS+, soft, nontender MSK: normal bulk and tone Neuro: awake, alert, no distress, MAEW    Resolved Hospital Problem list     Assessment & Plan:  Febrile illness, sepsis syndrome with elevated lactic acid in a patient with fatty liver and significant alcohol abuse: At this time it is unclear if he truly has sepsis due to an infectious organism despite the fever.  His urinalysis is not suggestive of urinary tract infection, chest x-ray is normal, abdominal exam and CT scan are not suggestive of an acute process in his abdomen.  While he may have a viral prodrome I think the most likely explanation is that this gentleman is profoundly dehydrated and his  fever is related to early alcohol withdrawal.  Is not unreasonable to check cultures as you have done and treat with broad-spectrum antibiotics.  Regardless the cause of his systemic inflammatory response (viral, reactive in setting of alcohol withdrawal and profound volume depletion from poor p.o. intake) he needs continued volume resuscitation.  I do not think that the elevated lactic acid is due to septic shock from a bacterial cause but I think it is related to a sick liver and profound volume depletion.  I am encouraged by the decline after volume resuscitation suspected will  continue to decline.  Is not surprising to me that it is elevated in the setting of fatty liver. > Continue aggressive volume resuscitation, will order 1 more liter bolus of lactated Ringer's and continuous infusion > Continue to trend lactic acid > Continue broad-spectrum antibiotic coverage for now but suspect we could stop this within the next 24 hours > consider echo cardiogram to check for signs of heart failure  Alcohol abuse/risk of withdrawal: > Would administer thiamine and folate > Continue IV fluids, careful monitoring for alcohol withdrawal > CIWA protocol  Profound electrolyte derangements including hypokalemia and hypomagnesemia in setting of alcohol abuse > Replace  At this time the patient is not in shock and can be admitted by the hosptalists service.  Needs careful monitoring for EtOH withdrawal, IV fluid administration, consideration given for echocardiogram.  PCCM available PRN  Best practice:   n/a  Labs   CBC: Recent Labs  Lab 08/28/19 1412  WBC 8.4  NEUTROABS 6.1  HGB 11.9*  HCT 33.4*  MCV 99.1  PLT 17*    Basic Metabolic Panel: Recent Labs  Lab 08/28/19 1412  NA 130*  K <2.0*  CL 85*  CO2 22  GLUCOSE 113*  BUN 14  CREATININE 1.14  CALCIUM 7.3*  MG 1.2*   GFR: CrCl cannot be calculated (Unknown ideal weight.). Recent Labs  Lab 08/28/19 1412  WBC 8.4  LATICACIDVEN >11.0*    Liver Function Tests: Recent Labs  Lab 08/28/19 1412  AST 239*  ALT 106*  ALKPHOS 94  BILITOT 5.3*  PROT 6.5  ALBUMIN 3.2*   Recent Labs  Lab 08/28/19 1412  LIPASE 67*   No results for input(s): AMMONIA in the last 168 hours.  ABG    Component Value Date/Time   TCO2 21 (L) 08/26/2017 0127     Coagulation Profile: No results for input(s): INR, PROTIME in the last 168 hours.  Cardiac Enzymes: No results for input(s): CKTOTAL, CKMB, CKMBINDEX, TROPONINI in the last 168 hours.  HbA1C: No results found for: HGBA1C  CBG: No results for  input(s): GLUCAP in the last 168 hours.  Review of Systems:   Gen per HPI HEENT: Denies blurred vision, double vision, hearing loss, tinnitus, sinus congestion, rhinorrhea, sore throat, neck stiffness, dysphagia PULM: Denies shortness of breath, cough, sputum production, hemoptysis, wheezing CV: Denies chest pain, edema, orthopnea, paroxysmal nocturnal dyspnea, palpitations GI: Denies abdominal pain, nausea, + vomiting, diarrhea, hematochezia, melena, constipation, change in bowel habits GU: Denies dysuria, hematuria, polyuria, oliguria, urethral discharge Endocrine: Denies hot or cold intolerance, polyuria, polyphagia or appetite change Derm: Denies rash, dry skin, scaling or peeling skin change Heme: Denies easy bruising, bleeding, bleeding gums Neuro: Denies headache, numbness, weakness, slurred speech, loss of memory or consciousness   Past Medical History  He,  has a past medical history of Eczema, Hypertension, and Migraine headache.   Surgical History   History reviewed. No pertinent surgical history.  Social History   reports that he has been smoking cigarettes. He has been smoking about 1.00 pack per day. He has never used smokeless tobacco. He reports current alcohol use. He reports current drug use. Drug: Marijuana.   Family History   His family history is not on file.   Allergies Allergies  Allergen Reactions  . Mushroom Extract Complex Diarrhea and Other (See Comments)    Itchy throat     Home Medications  Prior to Admission medications   Medication Sig Start Date End Date Taking? Authorizing Provider  ibuprofen (ADVIL,MOTRIN) 200 MG tablet Take 200-800 mg by mouth every 6 (six) hours as needed for moderate pain.   Yes [provider]  lisinopril (ZESTRIL) 5 MG tablet Take 5 mg by mouth daily.   Yes [provider]  Multiple Vitamin (MULTIVITAMIN PO) Take 1 tablet by mouth daily as needed (nutrition).   Yes [provider]    acetaminophen (TYLENOL) 325 MG tablet Take 3 tablets (975 mg total) by mouth every 6 (six) hours as needed for mild pain. Patient not taking: Reported on 04/10/2018 08/27/17   Saverio Danker, PA-C  docusate sodium (COLACE) 100 MG capsule Take 1 capsule (100 mg total) by mouth 2 (two) times daily. Patient not taking: Reported on 04/10/2018 08/27/17   Saverio Danker, PA-C  lisinopril (PRINIVIL,ZESTRIL) 5 MG tablet Take 1 tablet (5 mg total) by mouth daily. 04/10/18 05/10/18  Curatolo, Adam, DO  methocarbamol (ROBAXIN) 500 MG tablet Take 1 tablet (500 mg total) by mouth every 8 (eight) hours as needed for muscle spasms. Patient not taking: Reported on 04/10/2018 08/27/17   Saverio Danker, PA-C  Oxycodone HCl 10 MG TABS Take 1 tablet (10 mg total) by mouth every 4 (four) hours as needed. Patient not taking: Reported on 04/10/2018 08/27/17   Saverio Danker, PA-C     Critical care time: n/a     Roselie Awkward, MD [facility] Pager: [phone] Cell: [phone] If no response, call [phone]

## 2019-08-28 NOTE — Progress Notes (Signed)
A consult was received from an ED physician for vanc/cefepime per pharmacy dosing.  The patient's profile has been reviewed for ht/wt/allergies/indication/available labs.   A one time order has been placed for vanc 1g and cefepime 2g.  Further antibiotics/pharmacy consults should be ordered by admitting physician if indicated.                       Thank you, Berkley Harvey 08/28/2019  1:48 PM

## 2019-08-28 NOTE — ED Provider Notes (Signed)
[facility]-EMERGENCY DEPT Provider Note   CSN: [id number] Arrival date & time: 08/28/19  1222     History Chief Complaint  Patient presents with  . Alcohol Intoxication  . Fatigue  . Nausea  . Hematemesis    Patrick [name] is a 44 y.o. male.  The history is provided by the patient and the EMS personnel. No language interpreter was used.  Alcohol Intoxication     44 year old male with significant history of alcohol abuse presenting to ED complaining generalized weakness.  Patient admits that he is a heavy smoker smoking at least a pack of cigarettes a day as well as heavy daily drinker.  States that for the past week he has been increasingly weaker than usual, has had multiple falls due to his generalized weakness, has had nausea, vomiting up dark emesis with blood, endorse abdominal pain, has had cough, generalized achiness and more lethargy.  Abdominal pain is sharp achy moderate in severity.  He is at home by himself, is having to use a walker to move around.  When EMS arrived, patient was found lying on the ground outside of his home covering in feces.  Last alcohol concern was today.  Denies any recent Covid exposure, has not had Covid vaccination.  Past Medical History:  Diagnosis Date  . Eczema   . Hypertension   . Migraine headache     Patient Active Problem List   Diagnosis Date Noted  . Closed compression fracture of L2 lumbar vertebra, initial encounter (HCC) 08/26/2017  . Behavior change due to substance use 02/27/2017  . Alcohol use disorder, severe, dependence (HCC) 02/27/2017    History reviewed. No pertinent surgical history.     History reviewed. No pertinent family history.  Social History   Tobacco Use  . Smoking status: Current Every Day Smoker    Packs/day: 1.00    Types: Cigarettes  . Smokeless tobacco: Never Used  Substance Use Topics  . Alcohol use: Yes    Comment: every day depends on day of week as to how much  . Drug  use: Yes    Types: Marijuana    Comment: every day    Home Medications Prior to Admission medications   Medication Sig Start Date End Date Taking? Authorizing Provider  acetaminophen (TYLENOL) 325 MG tablet Take 3 tablets (975 mg total) by mouth every 6 (six) hours as needed for mild pain. Patient not taking: Reported on 04/10/2018 08/27/17   Barnetta Chapel, PA-C  aspirin EC 81 MG tablet Take 81 mg by mouth daily.    [provider]  docusate sodium (COLACE) 100 MG capsule Take 1 capsule (100 mg total) by mouth 2 (two) times daily. Patient not taking: Reported on 04/10/2018 08/27/17   Barnetta Chapel, PA-C  ibuprofen (ADVIL,MOTRIN) 200 MG tablet Take 200-800 mg by mouth every 6 (six) hours as needed for moderate pain.    [provider]  lisinopril (PRINIVIL,ZESTRIL) 5 MG tablet Take 1 tablet (5 mg total) by mouth daily. 04/10/18 05/10/18  Curatolo, Adam, DO  methocarbamol (ROBAXIN) 500 MG tablet Take 1 tablet (500 mg total) by mouth every 8 (eight) hours as needed for muscle spasms. Patient not taking: Reported on 04/10/2018 08/27/17   Barnetta Chapel, PA-C  Oxycodone HCl 10 MG TABS Take 1 tablet (10 mg total) by mouth every 4 (four) hours as needed. Patient not taking: Reported on 04/10/2018 08/27/17   Barnetta Chapel, PA-C    Allergies    Mushroom extract complex  Review of Systems   Review of Systems  All other systems reviewed and are negative.   Physical Exam Updated Vital Signs BP (!) 104/91   Pulse (!) 125   Temp 100.1 F (37.8 C)   Resp (!) 22   SpO2 97%   Physical Exam Vitals and nursing note reviewed.  Constitutional:      General: He is not in acute distress.    Appearance: He is well-developed.     Comments: Patient appears disheveled, globally weak with tremulous voice and tremulous movement.  HENT:     Head: Normocephalic.     Comments: Tenderness noted to right temporal and parietal region on palpation without any crepitus  emphysema.  Conjunctiva injected bilaterally.  No hemotympanum, no septal hematoma, no malocclusion or midface tenderness.    Mouth/Throat:     Mouth: Mucous membranes are dry.     Comments: Lips are dry with dried blood noted Eyes:     Extraocular Movements: Extraocular movements intact.     Conjunctiva/sclera: Conjunctivae normal.     Pupils: Pupils are equal, round, and reactive to light.  Cardiovascular:     Rate and Rhythm: Tachycardia present.     Pulses: Normal pulses.     Heart sounds: Normal heart sounds.  Pulmonary:     Effort: Pulmonary effort is normal.     Breath sounds: Rales present. No wheezing or rhonchi.  Abdominal:     Palpations: Abdomen is soft.     Tenderness: There is abdominal tenderness (Abdomen is diffusely tender with guarding.).  Musculoskeletal:     Cervical back: Neck supple. No rigidity.     Comments: No significant midline spine tenderness.  Skin:    Findings: Bruising (Ecchymosis noted to right upper chest wall, right medial thigh, and left flank.) present. No rash.  Neurological:     Mental Status: He is alert and oriented to person, place, and time.     Comments: Globally weak but equal strength throughout.     ED Results / Procedures / Treatments   Labs (all labs ordered are listed, but only abnormal results are displayed) Labs Reviewed  LACTIC ACID, PLASMA - Abnormal; Notable for the following components:      Result Value   Lactic Acid, Venous >11.0 (*)    All other components within normal limits  COMPREHENSIVE METABOLIC PANEL - Abnormal; Notable for the following components:   Sodium 130 (*)    Potassium <2.0 (*)    Chloride 85 (*)    Glucose, Bld 113 (*)    Calcium 7.3 (*)    Albumin 3.2 (*)    AST 239 (*)    ALT 106 (*)    Total Bilirubin 5.3 (*)    Anion gap 23 (*)    All other components within normal limits  CBC WITH DIFFERENTIAL/PLATELET - Abnormal; Notable for the following components:   RBC 3.37 (*)    Hemoglobin  11.9 (*)    HCT 33.4 (*)    MCH 35.3 (*)    Platelets 17 (*)    nRBC 0.4 (*)    Abs Immature Granulocytes 0.56 (*)    All other components within normal limits  LIPASE, BLOOD - Abnormal; Notable for the following components:   Lipase 67 (*)    All other components within normal limits  ETHANOL - Abnormal; Notable for the following components:   Alcohol, Ethyl (B) 76 (*)    All other components within normal limits  MAGNESIUM - Abnormal; Notable  for the following components:   Magnesium 1.2 (*)    All other components within normal limits  CULTURE, BLOOD (ROUTINE X 2)  CULTURE, BLOOD (ROUTINE X 2)  URINE CULTURE  RESPIRATORY PANEL BY RT PCR (FLU A&B, COVID)  LACTIC ACID, PLASMA  APTT  PROTIME-INR  URINALYSIS, ROUTINE W REFLEX MICROSCOPIC  BLOOD GAS, VENOUS  POC OCCULT BLOOD, ED    EKG EKG Interpretation  Date/Time:  Thursday August 28 2019 12:39:40 EDT Ventricular Rate:  121 PR Interval:    QRS Duration: 95 QT Interval:  437 QTC Calculation: 621 R Axis:   94 Text Interpretation: Sinus or ectopic atrial tachycardia Borderline right axis deviation Prolonged QT interval Confirmed by Sherwood Gambler [phone]) on 08/28/2019 2:48:10 PM   Radiology DG Chest Port 1 View  Result Date: 08/28/2019 CLINICAL DATA:  Sepsis weakness EXAM: PORTABLE CHEST 1 VIEW COMPARISON:  09/23/2018, CT 08/26/2017, radiograph 05/26/2016 FINDINGS: Low lung volumes. Mildly elevated right diaphragm. No consolidation or effusion. Heart size within normal limits allowing for low lung volume. No pneumothorax.nodular opacity in the left mid lung probably represents vessel on end. IMPRESSION: Low lung volumes. Nodular opacity in the left mid lung probably represents vascular artifact. Suggest short interval two-view chest radiograph follow-up. Electronically Signed   By: Donavan Foil M.D.   On: 08/28/2019 15:01    Procedures .Critical Care Performed by: Domenic Moras, PA-C Authorized by: Domenic Moras, PA-C    Critical care provider statement:    Critical care time (minutes):  75   Critical care was time spent personally by me on the following activities:  Discussions with consultants, evaluation of patient's response to treatment, examination of patient, ordering and performing treatments and interventions, ordering and review of laboratory studies, ordering and review of radiographic studies, pulse oximetry, re-evaluation of patient's condition, obtaining history from patient or surrogate and review of old charts   (including critical care time)  Medications Ordered in ED Medications  metroNIDAZOLE (FLAGYL) IVPB 500 mg (500 mg Intravenous New Bag/Given 08/28/19 1509)  vancomycin (VANCOCIN) IVPB 1000 mg/200 mL premix (1,000 mg Intravenous New Bag/Given 08/28/19 1536)  potassium chloride 10 mEq in 100 mL IVPB (has no administration in time range)  magnesium sulfate IVPB 4 g 100 mL (has no administration in time range)  lactated ringers bolus 1,000 mL (1,000 mLs Intravenous New Bag/Given 08/28/19 1409)    And  lactated ringers bolus 1,000 mL (0 mLs Intravenous Stopped 08/28/19 1511)    And  lactated ringers bolus 500 mL (0 mLs Intravenous Stopped 08/28/19 1511)  ceFEPIme (MAXIPIME) 2 g in sodium chloride 0.9 % 100 mL IVPB (0 g Intravenous Stopped 08/28/19 1511)  pantoprazole (PROTONIX) injection 40 mg (40 mg Intravenous Given 08/28/19 1535)  chlordiazePOXIDE (LIBRIUM) capsule 25 mg (25 mg Oral Given 08/28/19 1535)  potassium chloride SA (KLOR-CON) CR tablet 40 mEq (40 mEq Oral Given 08/28/19 1535)    ED Course  I have reviewed the triage vital signs and the nursing notes.  Pertinent labs & imaging results that were available during my care of the patient were reviewed by me and considered in my medical decision making (see chart for details).    MDM Rules/Calculators/A&P                      BP (!) 143/108   Pulse (!) 112   Temp (!) 101.2 F (38.4 C) (Rectal)   Resp 20   SpO2 100%   Final  Clinical Impression(s) / ED Diagnoses  Final diagnoses:  Sepsis with acute liver failure without hepatic coma or septic shock, due to unspecified organism (HCC)  Hypokalemia  Transaminitis    Rx / DC Orders ED Discharge Orders    None     3:26 PM Patient with history of alcohol abuse presenting with generalized weakness and recurrent falls.  Symptoms seems to be ongoing for more than a week.  Patient appears to be disheveled, globally weak, but mentating and answering question appropriately.  He appears very dry, dried blood noted around lips and states he has been nauseous and vomiting multiple times with having abdominal pain as well as recurrent cough.  Patient was found to have an elevated temperature of 101.2, elevated heart rate of 110, lactic acid greater than 11.  This is concerning for infectious pathology such as aspiration pneumonia or potentially abdominal pathology due to his diffuse abdominal tenderness on exam.  Broad-spectrum antibiotic initiated along with IV fluid at 30 mm/kg.  History of alcohol use, last use was today.  He is at high risk for alcohol withdrawal therefore Librium was given.  Comprehensive metabolic panel returned showing a potassium less than 2, sodium of 130, transaminitis with AST 239, ALT 106, and a total bili of 5.3.  His anion gap is 23 likely due to hypochloremia of 85.  Platelets is very low at 17.  Magnesium level was low at 1.2.  Alcohol 76, lipase 67.  Patient will be given magnesium supplementation as his EKG shows prolonged QT.  He will also receive potassium supplementation.  Appreciate consultation from intensivist, Dr. Kendrick Fries, who request for a repeat lactic acid after patient received fluid resuscitation is at 30 mL/kg.  Patient should be reconsulted once results are back.  Patient sign out to oncoming provider who will consult intensivist after repeat lactic acid and CT of abd/pelvis.   Patrick [name] was evaluated in Emergency Department on  08/28/2019 for the symptoms described in the history of present illness. He was evaluated in the context of the global COVID-19 pandemic, which necessitated consideration that the patient might be at risk for infection with the SARS-CoV-2 virus that causes COVID-19. Institutional protocols and algorithms that pertain to the evaluation of patients at risk for COVID-19 are in a state of rapid change based on information released by regulatory bodies including the CDC and federal and state organizations. These policies and algorithms were followed during the patient's care in the ED.    Fayrene Helper, PA-C 08/28/19 1559    Pricilla Loveless, MD 08/29/19 [phone]

## 2019-08-28 NOTE — ED Notes (Signed)
Provider notified of lab result Lac 9.7

## 2019-08-29 ENCOUNTER — Inpatient Hospital Stay: Payer: Self-pay

## 2019-08-29 ENCOUNTER — Encounter (HOSPITAL_COMMUNITY): Payer: Self-pay | Admitting: Internal Medicine

## 2019-08-29 ENCOUNTER — Inpatient Hospital Stay (HOSPITAL_COMMUNITY): Payer: Self-pay

## 2019-08-29 ENCOUNTER — Inpatient Hospital Stay (HOSPITAL_COMMUNITY)
Admit: 2019-08-29 | Discharge: 2019-08-29 | Disposition: A | Discharge status: Home / Self Care | Payer: Self-pay | Attending: Internal Medicine | Admitting: Internal Medicine

## 2019-08-29 DIAGNOSIS — K760 Fatty (change of) liver, not elsewhere classified: Secondary | ICD-10-CM

## 2019-08-29 DIAGNOSIS — D696 Thrombocytopenia, unspecified: Secondary | ICD-10-CM

## 2019-08-29 DIAGNOSIS — R7881 Bacteremia: Secondary | ICD-10-CM

## 2019-08-29 DIAGNOSIS — I5030 Unspecified diastolic (congestive) heart failure: Secondary | ICD-10-CM

## 2019-08-29 DIAGNOSIS — A419 Sepsis, unspecified organism: Secondary | ICD-10-CM

## 2019-08-29 DIAGNOSIS — F10239 Alcohol dependence with withdrawal, unspecified: Secondary | ICD-10-CM

## 2019-08-29 DIAGNOSIS — R4182 Altered mental status, unspecified: Secondary | ICD-10-CM

## 2019-08-29 DIAGNOSIS — D638 Anemia in other chronic diseases classified elsewhere: Secondary | ICD-10-CM

## 2019-08-29 DIAGNOSIS — F17210 Nicotine dependence, cigarettes, uncomplicated: Secondary | ICD-10-CM

## 2019-08-29 DIAGNOSIS — R6520 Severe sepsis without septic shock: Secondary | ICD-10-CM | POA: Diagnosis present

## 2019-08-29 DIAGNOSIS — B958 Unspecified staphylococcus as the cause of diseases classified elsewhere: Secondary | ICD-10-CM

## 2019-08-29 DIAGNOSIS — K7200 Acute and subacute hepatic failure without coma: Secondary | ICD-10-CM

## 2019-08-29 DIAGNOSIS — F10230 Alcohol dependence with withdrawal, uncomplicated: Secondary | ICD-10-CM

## 2019-08-29 DIAGNOSIS — E876 Hypokalemia: Secondary | ICD-10-CM

## 2019-08-29 DIAGNOSIS — R7401 Elevation of levels of liver transaminase levels: Secondary | ICD-10-CM

## 2019-08-29 DIAGNOSIS — B1920 Unspecified viral hepatitis C without hepatic coma: Secondary | ICD-10-CM

## 2019-08-29 DIAGNOSIS — A4101 Sepsis due to Methicillin susceptible Staphylococcus aureus: Principal | ICD-10-CM

## 2019-08-29 LAB — BLOOD CULTURE ID PANEL (REFLEXED)

## 2019-08-29 LAB — COMPREHENSIVE METABOLIC PANEL
ALT: 84 U/L — ABNORMAL HIGH (ref 0–44)
AST: 182 U/L — ABNORMAL HIGH (ref 15–41)
Albumin: 2.6 g/dL — ABNORMAL LOW (ref 3.5–5.0)
Alkaline Phosphatase: 70 U/L (ref 38–126)
Anion gap: 13 (ref 5–15)
BUN: 13 mg/dL (ref 6–20)
CO2: 27 mmol/L (ref 22–32)
Calcium: 7 mg/dL — ABNORMAL LOW (ref 8.9–10.3)
Chloride: 89 mmol/L — ABNORMAL LOW (ref 98–111)
Creatinine, Ser: 0.86 mg/dL (ref 0.61–1.24)
GFR calc Af Amer: >60 mL/min (ref >60)
GFR calc non Af Amer: >60 mL/min (ref >60)
Glucose, Bld: 134 mg/dL — ABNORMAL HIGH (ref 70–99)
Potassium: <2 mmol/L — CL (ref 3.5–5.1)
Sodium: 129 mmol/L — ABNORMAL LOW (ref 135–145)
Total Bilirubin: 5.6 mg/dL — ABNORMAL HIGH (ref 0.3–1.2)
Total Protein: 5.5 g/dL — ABNORMAL LOW (ref 6.5–8.1)

## 2019-08-29 LAB — URINE CULTURE: Culture: NO GROWTH

## 2019-08-29 LAB — CBC
HCT: 26.6 % — ABNORMAL LOW (ref 39.0–52.0)
Hemoglobin: 9.3 g/dL — ABNORMAL LOW (ref 13.0–17.0)
MCH: 35.4 pg — ABNORMAL HIGH (ref 26.0–34.0)
MCHC: 35 g/dL (ref 30.0–36.0)
MCV: 101.1 fL — ABNORMAL HIGH (ref 80.0–100.0)
Platelets: 11 10*3/uL — CL (ref 150–400)
RBC: 2.63 MIL/uL — ABNORMAL LOW (ref 4.22–5.81)
RDW: 13 % (ref 11.5–15.5)
WBC: 5.7 10*3/uL (ref 4.0–10.5)
nRBC: 0.3 % — ABNORMAL HIGH (ref 0.0–0.2)

## 2019-08-29 LAB — RETICULOCYTES
Immature Retic Fract: 15.6 % (ref 2.3–15.9)
RBC.: 2.6 MIL/uL — ABNORMAL LOW (ref 4.22–5.81)
Retic Count, Absolute: 29.4 10*3/uL (ref 19.0–186.0)
Retic Ct Pct: 1.1 % (ref 0.4–3.1)

## 2019-08-29 LAB — LACTIC ACID, PLASMA
Lactic Acid, Venous: 3.4 mmol/L (ref 0.5–1.9)
Lactic Acid, Venous: 2.8 mmol/L (ref 0.5–1.9)

## 2019-08-29 LAB — RAPID URINE DRUG SCREEN, HOSP PERFORMED
Amphetamines: NOT DETECTED
Barbiturates: NOT DETECTED
Benzodiazepines: POSITIVE — AB
Cocaine: NOT DETECTED
Opiates: NOT DETECTED
Tetrahydrocannabinol: NOT DETECTED

## 2019-08-29 LAB — APTT: aPTT: 48 seconds — ABNORMAL HIGH (ref 24–36)

## 2019-08-29 LAB — BASIC METABOLIC PANEL
Anion gap: 12 (ref 5–15)
BUN: 14 mg/dL (ref 6–20)
CO2: 24 mmol/L (ref 22–32)
Calcium: 7.1 mg/dL — ABNORMAL LOW (ref 8.9–10.3)
Chloride: 93 mmol/L — ABNORMAL LOW (ref 98–111)
Creatinine, Ser: 0.73 mg/dL (ref 0.61–1.24)
GFR calc Af Amer: >60 mL/min (ref >60)
GFR calc non Af Amer: >60 mL/min (ref >60)
Glucose, Bld: 167 mg/dL — ABNORMAL HIGH (ref 70–99)
Potassium: 2.2 mmol/L — CL (ref 3.5–5.1)
Sodium: 129 mmol/L — ABNORMAL LOW (ref 135–145)

## 2019-08-29 LAB — PROTIME-INR
INR: 1.4 — ABNORMAL HIGH (ref 0.8–1.2)
Prothrombin Time: 16.3 seconds — ABNORMAL HIGH (ref 11.4–15.2)

## 2019-08-29 LAB — PHOSPHORUS: Phosphorus: 1.3 mg/dL — ABNORMAL LOW (ref 2.5–4.6)

## 2019-08-29 LAB — HEPATIC FUNCTION PANEL
ALT: 85 U/L — ABNORMAL HIGH (ref 0–44)
AST: 190 U/L — ABNORMAL HIGH (ref 15–41)
Albumin: 2.5 g/dL — ABNORMAL LOW (ref 3.5–5.0)
Alkaline Phosphatase: 69 U/L (ref 38–126)
Bilirubin, Direct: 3.6 mg/dL — ABNORMAL HIGH (ref 0.0–0.2)
Indirect Bilirubin: 2.7 mg/dL — ABNORMAL HIGH (ref 0.3–0.9)
Total Bilirubin: 6.3 mg/dL — ABNORMAL HIGH (ref 0.3–1.2)
Total Protein: 5.6 g/dL — ABNORMAL LOW (ref 6.5–8.1)

## 2019-08-29 LAB — VITAMIN B12: Vitamin B-12: 1178 pg/mL — ABNORMAL HIGH (ref 180–914)

## 2019-08-29 LAB — LACTATE DEHYDROGENASE: LDH: 483 U/L — ABNORMAL HIGH (ref 98–192)

## 2019-08-29 LAB — IRON AND TIBC
Iron: 154 ug/dL (ref 45–182)
Saturation Ratios: 93 % — ABNORMAL HIGH (ref 17.9–39.5)
TIBC: 166 ug/dL — ABNORMAL LOW (ref 250–450)
UIBC: 12 ug/dL

## 2019-08-29 LAB — HEPATITIS PANEL, ACUTE
HCV Ab: REACTIVE — AB
Hep A IgM: NONREACTIVE
Hep B C IgM: NONREACTIVE
Hepatitis B Surface Ag: NONREACTIVE

## 2019-08-29 LAB — ABO/RH: ABO/RH(D): O POS

## 2019-08-29 LAB — FOLATE: Folate: 6.1 ng/mL (ref >5.9)

## 2019-08-29 LAB — PATHOLOGIST SMEAR REVIEW

## 2019-08-29 LAB — TYPE AND SCREEN
ABO/RH(D): O POS
Antibody Screen: NEGATIVE

## 2019-08-29 LAB — CBG MONITORING, ED: Glucose-Capillary: 169 mg/dL — ABNORMAL HIGH (ref 70–99)

## 2019-08-29 LAB — SAVE SMEAR(SSMR), FOR PROVIDER SLIDE REVIEW

## 2019-08-29 LAB — FERRITIN: Ferritin: 4953 ng/mL — ABNORMAL HIGH (ref 24–336)

## 2019-08-29 LAB — MAGNESIUM: Magnesium: 3.3 mg/dL — ABNORMAL HIGH (ref 1.7–2.4)

## 2019-08-29 LAB — HIV ANTIBODY (ROUTINE TESTING W REFLEX): HIV Screen 4th Generation wRfx: NONREACTIVE

## 2019-08-29 LAB — ECHOCARDIOGRAM COMPLETE

## 2019-08-29 MED ORDER — PANTOPRAZOLE SODIUM 40 MG IV SOLR
40.0000 mg | Freq: Two times a day (BID) | INTRAVENOUS | Status: DC
  Administered 2019-08-29 – 2019-08-31 (×6): 40 mg via INTRAVENOUS
  Filled 2019-08-29 (×6): qty 40

## 2019-08-29 MED ORDER — LORAZEPAM 2 MG/ML IJ SOLN
1.0000 mg | INTRAMUSCULAR | Status: DC | PRN
  Administered 2019-08-30 (×2): 2 mg via INTRAVENOUS
  Administered 2019-08-30 – 2019-08-31 (×2): 4 mg via INTRAVENOUS
  Filled 2019-08-29: qty 2
  Filled 2019-08-29 (×2): qty 1
  Filled 2019-08-29: qty 2

## 2019-08-29 MED ORDER — POTASSIUM CHLORIDE 10 MEQ/100ML IV SOLN
INTRAVENOUS | Status: AC
  Filled 2019-08-29: qty 100

## 2019-08-29 MED ORDER — POTASSIUM CHLORIDE CRYS ER 20 MEQ PO TBCR
40.0000 meq | EXTENDED_RELEASE_TABLET | ORAL | Status: AC
  Administered 2019-08-29 (×3): 40 meq via ORAL
  Filled 2019-08-29 (×3): qty 2

## 2019-08-29 MED ORDER — THIAMINE HCL 100 MG/ML IJ SOLN
100.0000 mg | Freq: Once | INTRAMUSCULAR | Status: AC
  Administered 2019-08-29: 100 mg via INTRAMUSCULAR
  Filled 2019-08-29: qty 2

## 2019-08-29 MED ORDER — PHENOL 1.4 % MT LIQD
1.0000 | OROMUCOSAL | Status: DC | PRN
  Filled 2019-08-29: qty 177

## 2019-08-29 MED ORDER — CHLORDIAZEPOXIDE HCL 25 MG PO CAPS
25.0000 mg | ORAL_CAPSULE | Freq: Four times a day (QID) | ORAL | Status: DC
  Administered 2019-08-29 – 2019-08-30 (×3): 25 mg via ORAL
  Filled 2019-08-29 (×4): qty 1

## 2019-08-29 MED ORDER — LOPERAMIDE HCL 2 MG PO CAPS: 2.0000 mg | ORAL_CAPSULE | ORAL | Status: AC | PRN

## 2019-08-29 MED ORDER — THIAMINE HCL 100 MG PO TABS: 100.0000 mg | ORAL_TABLET | Freq: Every day | ORAL | Status: DC

## 2019-08-29 MED ORDER — CEFAZOLIN SODIUM-DEXTROSE 2-4 GM/100ML-% IV SOLN
2.0000 g | INTRAVENOUS | Status: AC
  Administered 2019-08-29: 14:00:00 2 g via INTRAVENOUS
  Filled 2019-08-29: qty 100

## 2019-08-29 MED ORDER — MAGNESIUM SULFATE 4 GM/100ML IV SOLN
4.0000 g | Freq: Once | INTRAVENOUS | Status: AC
  Administered 2019-08-29: 02:00:00 4 g via INTRAVENOUS
  Filled 2019-08-29: qty 100

## 2019-08-29 MED ORDER — ADULT MULTIVITAMIN W/MINERALS CH
1.0000 | ORAL_TABLET | Freq: Every day | ORAL | Status: DC
  Administered 2019-08-29 – 2019-09-05 (×5): 1 via ORAL
  Filled 2019-08-29 (×6): qty 1

## 2019-08-29 MED ORDER — POTASSIUM CHLORIDE 10 MEQ/100ML IV SOLN
10.0000 meq | INTRAVENOUS | Status: AC
  Administered 2019-08-29 (×4): 10 meq via INTRAVENOUS
  Filled 2019-08-29 (×2): qty 100

## 2019-08-29 MED ORDER — LISINOPRIL 5 MG PO TABS
5.0000 mg | ORAL_TABLET | Freq: Every day | ORAL | Status: DC
  Administered 2019-08-29 – 2019-09-05 (×5): 5 mg via ORAL
  Filled 2019-08-29 (×2): qty 1
  Filled 2019-08-29: qty 2
  Filled 2019-08-29: qty 1
  Filled 2019-08-29: qty 2
  Filled 2019-08-29: qty 1

## 2019-08-29 MED ORDER — CHLORDIAZEPOXIDE HCL 25 MG PO CAPS
25.0000 mg | ORAL_CAPSULE | Freq: Four times a day (QID) | ORAL | Status: DC | PRN
  Administered 2019-08-30: 09:00:00 25 mg via ORAL
  Filled 2019-08-29: qty 1

## 2019-08-29 MED ORDER — POTASSIUM CHLORIDE 10 MEQ/100ML IV SOLN
10.0000 meq | INTRAVENOUS | Status: AC
  Administered 2019-08-29 (×4): 10 meq via INTRAVENOUS
  Filled 2019-08-29 (×3): qty 100

## 2019-08-29 MED ORDER — CEFAZOLIN SODIUM-DEXTROSE 2-4 GM/100ML-% IV SOLN
2.0000 g | Freq: Three times a day (TID) | INTRAVENOUS | Status: DC
  Administered 2019-08-29 – 2019-09-05 (×19): 2 g via INTRAVENOUS
  Filled 2019-08-29 (×24): qty 100

## 2019-08-29 MED ORDER — PRO-STAT SUGAR FREE PO LIQD
30.0000 mL | Freq: Two times a day (BID) | ORAL | Status: DC
  Administered 2019-08-29 – 2019-09-04 (×5): 30 mL via ORAL
  Filled 2019-08-29 (×6): qty 30

## 2019-08-29 MED ORDER — HYDROXYZINE HCL 25 MG PO TABS
25.0000 mg | ORAL_TABLET | Freq: Four times a day (QID) | ORAL | Status: AC | PRN
  Administered 2019-09-01: 25 mg via ORAL
  Filled 2019-08-29: qty 1

## 2019-08-29 MED ORDER — CHLORDIAZEPOXIDE HCL 25 MG PO CAPS: 25.0000 mg | ORAL_CAPSULE | Freq: Every day | ORAL | Status: DC

## 2019-08-29 MED ORDER — POTASSIUM PHOSPHATES 15 MMOLE/5ML IV SOLN
30.0000 mmol | Freq: Once | INTRAVENOUS | Status: AC
  Administered 2019-08-29: 15:00:00 30 mmol via INTRAVENOUS
  Filled 2019-08-29: qty 10

## 2019-08-29 MED ORDER — BOOST / RESOURCE BREEZE PO LIQD CUSTOM
1.0000 | Freq: Two times a day (BID) | ORAL | Status: DC
  Administered 2019-08-29 – 2019-09-03 (×4): 1 via ORAL

## 2019-08-29 MED ORDER — CHLORDIAZEPOXIDE HCL 25 MG PO CAPS: 25.0000 mg | ORAL_CAPSULE | ORAL | Status: DC

## 2019-08-29 MED ORDER — CHLORDIAZEPOXIDE HCL 25 MG PO CAPS
25.0000 mg | ORAL_CAPSULE | Freq: Three times a day (TID) | ORAL | Status: DC
  Filled 2019-08-29: qty 1

## 2019-08-29 MED ORDER — LORAZEPAM 1 MG PO TABS
1.0000 mg | ORAL_TABLET | ORAL | Status: DC | PRN
  Administered 2019-08-30: 1 mg via ORAL
  Filled 2019-08-29: qty 1

## 2019-08-29 NOTE — Progress Notes (Signed)
PROGRESS NOTE    Patrick [name]  EHU:[id number] DOB: March 26, 1976 DOA: 08/28/2019 PCP: Patient, No Pcp Per    Chief Complaint  Patient presents with  . Alcohol Intoxication  . Fatigue  . Nausea  . Hematemesis    Brief Narrative: HPI per Dr. Sherolyn Buba is a 44 y.o. male with medical history significant of hypertension, eczema, migraine headaches, alcohol abuse, nicotine use.  Patient does have a history of heavy smoking, does drink heavily.  Patient drinks 6-10 beers and also 6 shots of hard liquor a day.  Today he was trying to stand up sit in a chair when he slipped and he fell and hurt his right hip.  Patient lay on the side for some time EMS was called and got him to the ER.  As per EMS the patient was covered with feces.  Patient states that his last drink of alcohol was today around noontime.  Did have nausea and vomitings.  The patient is a very poor historian.  Getting history from him is very difficult.  As per the ER notes there was mention of some bloody vomiting.  Patient was found to have some blood around his mouth and also did bite his tongue.  Not sure whether he had any seizure. Patient has abnormal blood work and PCCM was consulted and was evaluated by Dr Lake Bells who thought the patient could be admitted to the hospitalist service.  Patient was treated with IV fluids in the ER.  Patient complains of generalized body aches.  Patient lives at home by himself, uses a walker to move around.  Patient had a temperature of 101.2 Fahrenheit, tachycardic, lactic acid greater than 11, concern for aspiration pneumonia due to his vomitings, patient was treated with broad-spectrum antibiotics in the ER.  His LFTs were elevated.  Platelets were low at 17,000.  Patient was also found to have significant hypokalemia and hypomagnesemia and electrolytes are being replaced.  ED Course: Patient was treated with IV fluids, potassium and magnesium were replaced in the ER, his lactic acid level has  come down.  CT of the abdomen and pelvis with contrast done in the ER showed marked diffuse hepatic steatosis, bilateral nonobstructing nephrolithiasis, chronic superior endplate compression deformity of L2 with mild bony retropulsion.  Assessment & Plan:   Active Problems:   Alcohol withdrawal (HCC)  #1 sepsis secondary to MSSA bacteremia, POA Patient met criteria for sepsis on admission with tachycardia, lactic acidosis, fever of 101.  Blood cultures positive for MSSA.  Urinalysis nitrite negative, leukocytes negative.  Chest x-ray negative for any acute infiltrate.  Check a 2D echo to rule out endocarditis.  Start patient on IV Ancef.  Consulted ID for further evaluation and management.  2.Severehypokalemia/hypomagnesemia/hypocalcemia/hypophosphatemia Likely secondary to alcohol abuse.  Potassium at < 2.0 this morning.  Phosphorus was at 1.3, magnesium repleted at 3.3.  Give K. Dur 40 mEq p.o. every 4 hours x3 doses.  Potassium chloride 10 mEq IV every 1 hour x4 runs.  K-Phos 30 mmol IV x1.  Patient with potassium in IV fluids.  Serial BMET .  Check a vitamin D 25-hydroxy and a PTH.  Follow.  3.  Anemia/severe thrombocytopenia Likely secondary to ongoing alcohol abuse.  Check an anemia panel.  Patient with elevated bilirubin and concern for hemolysis.  Check LDH.  Check a haptoglobin.  Patient with no overt bleeding.  Platelet count at 11 from 17 on admission.  Last platelet count noted was 124 on 04/10/2018.  Transfusion  threshold hemoglobin less than 7.  Transfuse for platelets < 10 or acute bleed.  Safe peripheral smear.  Due to severity of thrombocytopenia will consult with hematology for further evaluation and management.  Follow.  4.  Transaminitis Likely secondary to alcohol abuse.  CT abdomen and pelvis with diffuse hepatic steatosis.  Acute hepatitis panel pending.  Follow.  5.  Alcohol abuse Patient with significant alcohol abuse.  Patient with no signs of withdrawal.  Patient on the  Ativan withdrawal protocol.  Will place on the Librium detox protocol in addition.  Continue thiamine, multivitamin, folic acid.  6.  Dehydration IV fluids.  7.??  Seizure like activity Concern likely secondary to alcohol abuse.  Patient noted on admission to have a tongue bite.  Head CT negative for any acute abnormalities.  Check a EEG.  Monitor closely for alcohol withdrawal.  8.??  GI bleed, POA Per admitting physician ED physician noted patient had some bloody vomitus however no bloody vomitus noted at time of admission.  Hemoglobin currently stable.  Monitor closely patient developed hematemesis will need GI consultation and evaluation.  Continue IV PPI.   DVT prophylaxis: SCDs/patient with severe thrombocytopenia Code Status: Full Family Communication: Updated patient.  No family at bedside. Disposition:   Status is: Inpatient    Dispo: The patient is from: Home              Anticipated d/c is to: Likely home with home health versus SNF              Anticipated d/c date is: To be determined.  Pending work-up of bacteremia.              Patient currently with MSSA bacteremia, currently started on IV antibiotics, ID and hematology consultations pending.  Patient with a severe thrombocytopenia.        Consultants:   ID pending  Hematology/oncology Dr. Alvy Bimler 08/29/2019  Procedures:  2D echo pending  CT head 08/29/2019  CT abdomen and pelvis 08/28/2019  Chest x-ray 08/28/2019  EEG pending  Antimicrobials:   IV Ancef 08/29/2019   Subjective: Patient sitting up on gurney.  Denies any ongoing chest pain or shortness of breath.  Denies any ongoing bleeding.  Complaining of right knee pain.  Objective: Vitals:   08/29/19 0500 08/29/19 0700 08/29/19 0901 08/29/19 0907  BP: (!) 121/107 137/88 131/84   Pulse: (!) 102 (!) 106 (!) 112   Resp: 18 (!) 23 20   Temp:    98.3 F (36.8 C)  TempSrc:    Oral  SpO2: 97% 97% 100%     Intake/Output Summary (Last 24  hours) at 08/29/2019 1012 Last data filed at 08/29/2019 0658 Gross per 24 hour  Intake 4500 ml  Output 500 ml  Net 4000 ml   There were no vitals filed for this visit.  Examination:  General exam: Appears calm and comfortable.  Dry mucous membranes. HEENT: Pupils equal round and reactive to light and accommodation.  Oropharynx with a purplish tongue with bite marks noted on the lateral tongue. Respiratory system: Clear to auscultation. Respiratory effort normal. Cardiovascular system: S1 & S2 heard, RRR. No JVD, murmurs, rubs, gallops or clicks. No pedal edema. Gastrointestinal system: Abdomen is nondistended, soft and nontender. No organomegaly or masses felt. Normal bowel sounds heard. Central nervous system: Alert and oriented. No focal neurological deficits. Extremities: Symmetric 5 x 5 power. Skin: No rashes, lesions or ulcers Psychiatry: Judgement and insight appear normal. Mood & affect appropriate.  Data Reviewed: I have personally reviewed following labs and imaging studies  CBC: Recent Labs  Lab 08/28/19 1412 08/29/19 0430  WBC 8.4 5.7  NEUTROABS 6.1  --   HGB 11.9* 9.3*  HCT 33.4* 26.6*  MCV 99.1 101.1*  PLT 17* 11*    Basic Metabolic Panel: Recent Labs  Lab 08/28/19 1412 08/29/19 0430  NA 130* 129*  K <2.0* <2.0*  CL 85* 89*  CO2 22 27  GLUCOSE 113* 134*  BUN 14 13  CREATININE 1.14 0.86  CALCIUM 7.3* 7.0*  MG 1.2* 3.3*  PHOS  --  1.3*    GFR: CrCl cannot be calculated (Unknown ideal weight.).  Liver Function Tests: Recent Labs  Lab 08/28/19 1412 08/29/19 0430  AST 239* 182*  ALT 106* 84*  ALKPHOS 94 70  BILITOT 5.3* 5.6*  PROT 6.5 5.5*  ALBUMIN 3.2* 2.6*    CBG: No results for input(s): GLUCAP in the last 168 hours.   Recent Results (from the past 240 hour(s))  Blood Culture (routine x 2)     Status: None (Preliminary result)   Collection Time: 08/28/19  2:12 PM   Specimen: BLOOD  Result Value Ref Range Status   Specimen  Description   Final    BLOOD LEFT ANTECUBITAL Performed at [facility], [place] [address], [place], [place] [address]    Special Requests   Final    BOTTLES DRAWN AEROBIC AND ANAEROBIC Blood Culture adequate volume Performed at [place] [address], [place], [place] [address]    Culture  Setup Time   Final    GRAM POSITIVE COCCI IN BOTH AEROBIC AND ANAEROBIC BOTTLES CRITICAL RESULT CALLED TO, READ BACK BY AND VERIFIED WITH: E. JACKSON, PHARMD (WL) AT 4562 ON 08/29/19 BY C. JESSUP, MT. Performed at [facility], [place] [address], [place], [place] [address]    Culture GRAM POSITIVE COCCI  Final   Report Status PENDING  Incomplete  Blood Culture ID Panel (Reflexed)     Status: Abnormal   Collection Time: 08/28/19  2:12 PM  Result Value Ref Range Status   Enterococcus species NOT DETECTED NOT DETECTED Final   Listeria monocytogenes NOT DETECTED NOT DETECTED Final   Staphylococcus species DETECTED (A) NOT DETECTED Final    Comment: CRITICAL RESULT CALLED TO, READ BACK BY AND VERIFIED WITH: E. JACKSON, PHARMD (WL) AT 3734 ON 08/29/19 BY C. JESSUP, MT.    Staphylococcus aureus (BCID) DETECTED (A) NOT DETECTED Final    Comment: Methicillin (oxacillin) susceptible Staphylococcus aureus (MSSA). Preferred therapy is anti staphylococcal beta lactam antibiotic (Cefazolin or Nafcillin), unless clinically contraindicated. CRITICAL RESULT CALLED TO, READ BACK BY AND VERIFIED WITH: E. JACKSON, PHARMD (WL) AT 2876 ON 08/29/19 BY C. JESSUP, MT.    Methicillin resistance NOT DETECTED NOT DETECTED Final   Streptococcus species NOT DETECTED NOT DETECTED Final   Streptococcus agalactiae NOT DETECTED NOT DETECTED Final   Streptococcus pneumoniae NOT DETECTED NOT DETECTED Final   Streptococcus pyogenes NOT DETECTED NOT DETECTED Final   Acinetobacter baumannii NOT DETECTED NOT DETECTED Final   Enterobacteriaceae species NOT DETECTED NOT DETECTED Final   Enterobacter cloacae  complex NOT DETECTED NOT DETECTED Final   Escherichia coli NOT DETECTED NOT DETECTED Final   Klebsiella oxytoca NOT DETECTED NOT DETECTED Final   Klebsiella pneumoniae NOT DETECTED NOT DETECTED Final   Proteus species NOT DETECTED NOT DETECTED Final   Serratia marcescens NOT DETECTED NOT DETECTED Final   Haemophilus influenzae NOT DETECTED  NOT DETECTED Final   Neisseria meningitidis NOT DETECTED NOT DETECTED Final   Pseudomonas aeruginosa NOT DETECTED NOT DETECTED Final   Candida albicans NOT DETECTED NOT DETECTED Final   Candida glabrata NOT DETECTED NOT DETECTED Final   Candida krusei NOT DETECTED NOT DETECTED Final   Candida parapsilosis NOT DETECTED NOT DETECTED Final   Candida tropicalis NOT DETECTED NOT DETECTED Final    Comment: Performed at [facility], [place] [address], [place], [place] [address]  Respiratory Panel by RT PCR (Flu A&B, Covid) - Nasopharyngeal Swab     Status: None   Collection Time: 08/28/19  3:30 PM   Specimen: Nasopharyngeal Swab  Result Value Ref Range Status   SARS Coronavirus 2 by RT PCR NEGATIVE NEGATIVE Final    Comment: (NOTE) SARS-CoV-2 target nucleic acids are NOT DETECTED. The SARS-CoV-2 RNA is generally detectable in upper respiratoy specimens during the acute phase of infection. The lowest concentration of SARS-CoV-2 viral copies this assay can detect is 131 copies/mL. A negative result does not preclude SARS-Cov-2 infection and should not be used as the sole basis for treatment or other patient management decisions. A negative result may occur with  improper specimen collection/handling, submission of specimen other than nasopharyngeal swab, presence of viral mutation(s) within the areas targeted by this assay, and inadequate number of viral copies (<131 copies/mL). A negative result must be combined with clinical observations, patient history, and epidemiological information. The expected result is Negative. Fact Sheet for Patients:    PinkCheek.be Fact Sheet for Healthcare Providers:  GravelBags.it This test is not yet ap proved or cleared by the [place] FDA and  has been authorized for detection and/or diagnosis of SARS-CoV-2 by FDA under an Emergency Use Authorization (EUA). This EUA will remain  in effect (meaning this test can be used) for the duration of the COVID-19 declaration under Section 564(b)(1) of the Act, 21 U.S.C. section 360bbb-3(b)(1), unless the authorization is terminated or revoked sooner.    Influenza A by PCR NEGATIVE NEGATIVE Final   Influenza B by PCR NEGATIVE NEGATIVE Final    Comment: (NOTE) The Xpert Xpress SARS-CoV-2/FLU/RSV assay is intended as an aid in  the diagnosis of influenza from Nasopharyngeal swab specimens and  should not be used as a sole basis for treatment. Nasal washings and  aspirates are unacceptable for Xpert Xpress SARS-CoV-2/FLU/RSV  testing. Fact Sheet for Patients: PinkCheek.be Fact Sheet for Healthcare Providers: GravelBags.it This test is not yet approved or cleared by the [place] FDA and  has been authorized for detection and/or diagnosis of SARS-CoV-2 by  FDA under an Emergency Use Authorization (EUA). This EUA will remain  in effect (meaning this test can be used) for the duration of the  Covid-19 declaration under Section 564(b)(1) of the Act, 21  U.S.C. section 360bbb-3(b)(1), unless the authorization is  terminated or revoked. Performed at [facility], [place] [address], [place], [place] [address]          Radiology Studies: CT HEAD WO CONTRAST  Result Date: 08/29/2019 CLINICAL DATA:  Fall with headache.  Thrombocytopenia EXAM: CT HEAD WITHOUT CONTRAST TECHNIQUE: Contiguous axial images were obtained from the base of the skull through the vertex without intravenous contrast. COMPARISON:  04/10/2018  FINDINGS: Brain: No evidence of swelling, infarction, hemorrhage, hydrocephalus, extra-axial collection or mass lesion/mass effect. Dilated perivascular spaces at/below the basal ganglia. Vascular: No hyperdense vessel or unexpected calcification. Skull: Normal. Negative for fracture or focal lesion. Sinuses/Orbits: No evidence of injury  Other: Motion degradation requiring 2 acquisitions. IMPRESSION: No evidence of intracranial injury. Electronically Signed   By: Monte Fantasia M.D.   On: 08/29/2019 06:05   CT ABDOMEN PELVIS W CONTRAST  Result Date: 08/28/2019 CLINICAL DATA:  Abdominal pain, hematemesis EXAM: CT ABDOMEN AND PELVIS WITH CONTRAST TECHNIQUE: Multidetector CT imaging of the abdomen and pelvis was performed using the standard protocol following bolus administration of intravenous contrast. CONTRAST:  167m OMNIPAQUE IOHEXOL 300 MG/ML  SOLN COMPARISON:  08/26/2017 FINDINGS: Lower chest: No acute abnormality. Hepatobiliary: Marked diffuse hepatic steatosis. No focal hepatic lesion is identified. Gallbladder appears unremarkable. No hyperdense gallstone. No biliary dilatation. Pancreas: Unremarkable. No pancreatic ductal dilatation or surrounding inflammatory changes. Spleen: Normal in size without focal abnormality. Adrenals/Urinary Tract: 6 mm upper pole right renal calculus. Punctate 2 mm calculus within the midpole of the left kidney. Kidneys enhance symmetrically. No renal lesion or hydronephrosis. Bilateral ureters are unremarkable. Urinary bladder is unremarkable. No adrenal nodule. Stomach/Bowel: Stomach is within normal limits. Appendix appears normal. No evidence of bowel wall thickening, distention, or inflammatory changes. Vascular/Lymphatic: Scattered atherosclerotic calcifications of the abdominal aorta. No aneurysm. No abdominopelvic lymphadenopathy. Reproductive: Prostate is unremarkable. Other: No abdominal wall hernia or abnormality. No abdominopelvic ascites. Musculoskeletal: Chronic  moderate superior endplate compression deformity of L2 with mild bony retropulsion, unchanged from prior. No new or acute osseous findings. IMPRESSION: 1. No acute abdominopelvic findings. 2. Marked diffuse hepatic steatosis. 3. Bilateral nonobstructive nephrolithiasis. 4. Chronic superior endplate compression deformity of L2 with mild bony retropulsion. 5. Aortic atherosclerosis. (ICD10-I70.0). Electronically Signed   By: NDavina PokeD.O.   On: 08/28/2019 16:54   UKoreaAbdomen Limited  Result Date: 08/28/2019 CLINICAL DATA:  Right upper quadrant pain; septic; significant ETOH use. EXAM: ULTRASOUND ABDOMEN LIMITED RIGHT UPPER QUADRANT COMPARISON:  CT abdomen/pelvis 08/28/2019, abdominal ultrasound 04/17/2006. FINDINGS: Gallbladder: There is sludge within the gallbladder. No gallstones are visualized. No appreciable gallbladder wall thickening. No sonographic MPercell Millersign is elicited by the scanning technologist. Common bile duct: Diameter: The common duct is nonvisualized due to obscuration by overlying bowel gas. Liver: No focal lesion identified. Generalized increased hepatic parenchymal echogenicity. Portal vein is patent on color Doppler imaging with normal direction of blood flow towards the liver. IMPRESSION: Gallbladder sludge. No appreciable gallstones or gallbladder wall thickening. The common duct is non-visualized due to obscuration by overlying bowel gas. Hyperechogenicity of the hepatic parenchyma consistent with marked hepatic steatosis demonstrated on same day CT abdomen/pelvis. Electronically Signed   By: KKellie SimmeringDO   On: 08/28/2019 19:30   DG Chest Port 1 View  Result Date: 08/28/2019 CLINICAL DATA:  Sepsis weakness EXAM: PORTABLE CHEST 1 VIEW COMPARISON:  09/23/2018, CT 08/26/2017, radiograph 05/26/2016 FINDINGS: Low lung volumes. Mildly elevated right diaphragm. No consolidation or effusion. Heart size within normal limits allowing for low lung volume. No pneumothorax.nodular opacity  in the left mid lung probably represents vessel on end. IMPRESSION: Low lung volumes. Nodular opacity in the left mid lung probably represents vascular artifact. Suggest short interval two-view chest radiograph follow-up. Electronically Signed   By: KDonavan FoilM.D.   On: 08/28/2019 15:01        Scheduled Meds: . lisinopril  5 mg Oral Daily  . nicotine  21 mg Transdermal Daily  . pantoprazole (PROTONIX) IV  40 mg Intravenous Q12H  . potassium chloride  40 mEq Oral Q4H   Continuous Infusions: . 0.9 % NaCl with KCl 40 mEq / L 125 mL/hr (08/29/19 0659)  .  ceFAZolin (ANCEF) IV    . lactated ringers 75 mL/hr at 08/28/19 2009  . potassium chloride    . potassium PHOSPHATE IVPB (in mmol)       LOS: 1 day    Time spent: 45 minutes    Irine Seal, MD [facility]   To contact the attending provider between 7A-7P or the covering provider during after hours 7P-7A, please log into the web site [email or web address] and access using universal [place] password for that web site. If you do not have the password, please call the hospital operator.  08/29/2019, 10:12 AM

## 2019-08-29 NOTE — Progress Notes (Signed)
CRITICAL VALUE ALERT  Critical Value: Potassium 2.2 Date & Time Notied:  08/29/19; 11:12 am Provider Notified: Janee Morn   Orders Received/Actions taken: IV Potassium, IV Fluids, PO Potassium

## 2019-08-29 NOTE — Consult Note (Signed)
[facility]  Total days of antibiotics 2/cefazolin               Reason for Consult: MSSA bacteremia    Referring Physician: Janee Morn  Active Problems:   Alcohol withdrawal (HCC)    HPI: Patrick [name] is a 44 y.o. male with htn, with heavy etoh use, was admitted on 4/29 where he reports was down on the ground for sometime after slipping out of his chair and injuring right hip, while intoxicated. He reports falling more than once. He also noticed having dark,coffee ground emesis in the week prior to admit. EMS found patient soiled clothing with urine and stool. Concern whether he may also have had seizure. He was found to have temp of 102F plus tachcyardia. He was started on broad spectrum abtx. His infectious work up revealed MSSA bacteremia by BCID. Other notable labs, include hypokalemia of 2.2, hyponatremia. AG of 12. LA of 11. Transaminitis, mild in the 80-150s. Tbili of 6 with dBili of 4. LDH 483. WBC of 8.4 with plt of 17 and bands seen. Imaging does not suggest infiltrate but has low lung volumes. He was started on sepsis protocol and repletion of electrolytes.  Past Medical History:  Diagnosis Date  . Eczema   . Hypertension   . Migraine headache     Allergies:  Allergies  Allergen Reactions  . Mushroom Extract Complex Diarrhea and Other (See Comments)    Itchy throat    MEDICATIONS: . lisinopril  5 mg Oral Daily  . nicotine  21 mg Transdermal Daily  . pantoprazole (PROTONIX) IV  40 mg Intravenous Q12H  . potassium chloride  40 mEq Oral Q4H    Social History   Tobacco Use  . Smoking status: Current Every Day Smoker    Packs/day: 1.00    Types: Cigarettes  . Smokeless tobacco: Never Used  Substance Use Topics  . Alcohol use: Yes    Comment: every day depends on day of week as to how much  . Drug use: Yes    Types: Marijuana    Comment: every day    History reviewed. No pertinent family history.  Review of Systems -  12 point ros  is negative except what is mentioned in hpi  OBJECTIVE: Temp:  [98.2 F (36.8 C)-100.3 F (37.9 C)] 98.3 F (36.8 C) (04/30 1357) Pulse Rate:  [95-118] 105 (04/30 1357) Resp:  [14-27] 20 (04/30 1357) BP: (100-169)/(71-123) 100/76 (04/30 1357) SpO2:  [95 %-100 %] 99 % (04/30 1357) Physical Exam  Constitutional: He is oriented to person, place, and time. He appears well-developed and well-nourished. Disheveled, mild distress.  HENT:  Mouth/Throat: Oropharynx is clear and moist. No oropharyngeal exudate. echymosis to tongue Cardiovascular: Normal rate, regular rhythm and normal heart sounds. Exam reveals no gallop and no friction rub.  No murmur heard.  Pulmonary/Chest: Effort normal and breath sounds normal. No respiratory distress. He has no wheezes.  Abdominal: Soft. Bowel sounds are normal. He exhibits no distension. There is no tenderness.  Lymphadenopathy:  He has no cervical adenopathy.  Neurological: He is alert and oriented to person, place, and time.  Skin: echymosis to left arm.Skin is warm and dry. No rash noted. Tattoos+ Psychiatric: sleepy   LABS: Results for orders placed or performed during the hospital encounter of 08/28/19 (from the past 48 hour(s))  Lactic acid, plasma     Status: Abnormal   Collection Time: 08/28/19  2:12 PM  Result Value Ref Range  Lactic Acid, Venous >11.0 (HH) 0.5 - 1.9 mmol/L    Comment: CRITICAL RESULT CALLED TO, READ BACK BY AND VERIFIED WITH: [facility]. RN @1503  08/28/19 BILLINGSLEY,L Performed at [facility], [address] [employer] [place], [place]   Comprehensive metabolic panel     Status: Abnormal   Collection Time: 08/28/19  2:12 PM  Result Value Ref Range   Sodium 130 (L) 135 - 145 mmol/L   Potassium <2.0 (LL) 3.5 - 5.1 mmol/L    Comment: CRITICAL RESULT CALLED TO, READ BACK BY AND VERIFIED WITH: FRANDLIN,C. RN @1509  08/28/19 BILLINGSLEY,L    Chloride 85 (L) 98 - 111 mmol/L   CO2 22 22 - 32 mmol/L    Glucose, Bld 113 (H) 70 - 99 mg/dL    Comment: Glucose reference range applies only to samples taken after fasting for at least 8 hours.   BUN 14 6 - 20 mg/dL   Creatinine, Ser 0.61 - 1.24 mg/dL   Calcium 7.3 (L) 8.9 - 10.3 mg/dL   Total Protein 6.5 6.5 - 8.1 g/dL   Albumin 3.2 (L) 3.5 - 5.0 g/dL   AST 08/30/19 (H) 15 - 41 U/L   ALT 106 (H) 0 - 44 U/L   Alkaline Phosphatase 94 38 - 126 U/L   Total Bilirubin 5.3 (H) 0.3 - 1.2 mg/dL   GFR calc non Af Amer >60 >60 mL/min   GFR calc Af Amer >60 >60 mL/min   Anion gap 23 (H) 5 - 15    Comment: REPEATED TO VERIFY Performed at [facility], [address], [place], [place]   CBC WITH DIFFERENTIAL     Status: Abnormal   Collection Time: 08/28/19  2:12 PM  Result Value Ref Range   WBC 8.4 4.0 - 10.5 K/uL   RBC 3.37 (L) 4.22 - 5.81 MIL/uL   Hemoglobin 11.9 (L) 13.0 - 17.0 g/dL   HCT 19509 (L) 08/30/19 - 32.6 %   MCV 99.1 80.0 - 100.0 fL   MCH 35.3 (H) 26.0 - 34.0 pg   MCHC 35.6 30.0 - 36.0 g/dL   RDW 71.2 45.8 - 09.9 %   Platelets 17 (LL) 150 - 400 K/uL    Comment: SPECIMEN CHECKED FOR CLOTS Immature Platelet Fraction may be clinically indicated, consider ordering this additional test 83.3 PLATELET COUNT CONFIRMED BY SMEAR REPEATED TO VERIFY    nRBC 0.4 (H) 0.0 - 0.2 %   Neutrophils Relative % 72 %   Neutro Abs 6.1 1.7 - 7.7 K/uL   Lymphocytes Relative 16 %   Lymphs Abs 1.3 0.7 - 4.0 K/uL   Monocytes Relative 5 %   Monocytes Absolute 0.4 0.1 - 1.0 K/uL   Eosinophils Relative 0 %   Eosinophils Absolute 0.0 0.0 - 0.5 K/uL   Basophils Relative 0 %   Basophils Absolute 0.0 0.0 - 0.1 K/uL   Immature Granulocytes 7 %   Abs Immature Granulocytes 0.56 (H) 0.00 - 0.07 K/uL    Comment: Performed at [facility], [address] [place], [place]  APTT     Status: None   Collection Time: 08/28/19  2:12 PM  Result Value Ref Range   aPTT 36 24 - 36 seconds    Comment: Performed  at [facility], [address] [place], [place]  Protime-INR     Status: Abnormal   Collection Time: 08/28/19  2:12 PM  Result Value Ref Range   Prothrombin  Time 16.0 (H) 11.4 - 15.2 seconds   INR 1.3 (H) 0.8 - 1.2    Comment: (NOTE) INR goal varies based on device and disease states. Performed at [facility], [address], [place], [place] [address]   Blood Culture (routine x 2)     Status: None (Preliminary result)   Collection Time: 08/28/19  2:12 PM   Specimen: BLOOD  Result Value Ref Range   Specimen Description      BLOOD LEFT ANTECUBITAL Performed at [facility], [address], [place], [place] [address]    Special Requests      BOTTLES DRAWN AEROBIC AND ANAEROBIC Blood Culture adequate volume Performed at [facility], [address], [place], [place] [address]    Culture  Setup Time      GRAM POSITIVE COCCI IN BOTH AEROBIC AND ANAEROBIC BOTTLES CRITICAL RESULT CALLED TO, READ BACK BY AND VERIFIED WITH: E. JACKSON, PHARMD (WL) AT 9147 ON 08/29/19 BY C. JESSUP, MT. Performed at [facility], [address] [place], [place] [address]    Culture GRAM POSITIVE COCCI    Report Status PENDING   Lipase, blood     Status: Abnormal   Collection Time: 08/28/19  2:12 PM  Result Value Ref Range   Lipase 67 (H) 11 - 51 U/L    Comment: Performed at [facility], [address] [place], [place] [address]  Ethanol     Status: Abnormal   Collection Time: 08/28/19  2:12 PM  Result Value Ref Range   Alcohol, Ethyl (B) 76 (H) <10 mg/dL    Comment: (NOTE) Lowest detectable limit for serum alcohol is 10 mg/dL. For medical purposes only. Performed at [facility], [address] [place], [place] [address]   Magnesium     Status: Abnormal   Collection Time: 08/28/19  2:12 PM  Result Value Ref Range   Magnesium 1.2 (L) 1.7 - 2.4 mg/dL    Comment: Performed at  [facility], [address], [place], [place] [address]  Blood Culture ID Panel (Reflexed)     Status: Abnormal   Collection Time: 08/28/19  2:12 PM  Result Value Ref Range   Enterococcus species NOT DETECTED NOT DETECTED   Listeria monocytogenes NOT DETECTED NOT DETECTED   Staphylococcus species DETECTED (A) NOT DETECTED    Comment: CRITICAL RESULT CALLED TO, READ BACK BY AND VERIFIED WITH: E. JACKSON, PHARMD (WL) AT 5284 ON 08/29/19 BY C. JESSUP, MT.    Staphylococcus aureus (BCID) DETECTED (A) NOT DETECTED    Comment: Methicillin (oxacillin) susceptible Staphylococcus aureus (MSSA). Preferred therapy is anti staphylococcal beta lactam antibiotic (Cefazolin or Nafcillin), unless clinically contraindicated. CRITICAL RESULT CALLED TO, READ BACK BY AND VERIFIED WITH: E. JACKSON, PHARMD (WL) AT 1324 ON 08/29/19 BY C. JESSUP, MT.    Methicillin resistance NOT DETECTED NOT DETECTED   Streptococcus species NOT DETECTED NOT DETECTED   Streptococcus agalactiae NOT DETECTED NOT DETECTED   Streptococcus pneumoniae NOT DETECTED NOT DETECTED   Streptococcus pyogenes NOT DETECTED NOT DETECTED   Acinetobacter baumannii NOT DETECTED NOT DETECTED   Enterobacteriaceae species NOT DETECTED NOT DETECTED   Enterobacter cloacae complex NOT DETECTED NOT DETECTED   Escherichia coli NOT DETECTED NOT DETECTED   Klebsiella oxytoca NOT DETECTED NOT DETECTED   Klebsiella pneumoniae NOT DETECTED NOT DETECTED   Proteus species NOT DETECTED NOT DETECTED   Serratia marcescens NOT DETECTED NOT DETECTED   Haemophilus influenzae  NOT DETECTED NOT DETECTED   Neisseria meningitidis NOT DETECTED NOT DETECTED   Pseudomonas aeruginosa NOT DETECTED NOT DETECTED   Candida albicans NOT DETECTED NOT DETECTED   Candida glabrata NOT DETECTED NOT DETECTED   Candida krusei NOT DETECTED NOT DETECTED   Candida parapsilosis NOT DETECTED NOT DETECTED   Candida tropicalis NOT DETECTED NOT DETECTED    Comment:  Performed at [facility], [address] [place], [place] [address]  Blood Culture (routine x 2)     Status: None (Preliminary result)   Collection Time: 08/28/19  2:27 PM   Specimen: BLOOD  Result Value Ref Range   Specimen Description      BLOOD RIGHT ANTECUBITAL Performed at [facility], [address] [place], [place] [address]    Special Requests      BOTTLES DRAWN AEROBIC AND ANAEROBIC Blood Culture adequate volume Performed at [facility], [address], [place], [place] [address]    Culture  Setup Time      GRAM POSITIVE COCCI IN CLUSTERS ANAEROBIC BOTTLE ONLY CRITICAL VALUE NOTED.  VALUE IS CONSISTENT WITH PREVIOUSLY REPORTED AND CALLED VALUE.    Culture      CULTURE REINCUBATED FOR BETTER GROWTH Performed at [facility], [address] [place], [place] [address]    Report Status PENDING   Respiratory Panel by RT PCR (Flu A&B, Covid) - Nasopharyngeal Swab     Status: None   Collection Time: 08/28/19  3:30 PM   Specimen: Nasopharyngeal Swab  Result Value Ref Range   SARS Coronavirus 2 by RT PCR NEGATIVE NEGATIVE    Comment: (NOTE) SARS-CoV-2 target nucleic acids are NOT DETECTED. The SARS-CoV-2 RNA is generally detectable in upper respiratoy specimens during the acute phase of infection. The lowest concentration of SARS-CoV-2 viral copies this assay can detect is 131 copies/mL. A negative result does not preclude SARS-Cov-2 infection and should not be used as the sole basis for treatment or other patient management decisions. A negative result may occur with  improper specimen collection/handling, submission of specimen other than nasopharyngeal swab, presence of viral mutation(s) within the areas targeted by this assay, and inadequate number of viral copies (<131 copies/mL). A negative result must be combined with clinical observations, patient history, and epidemiological information. The expected result is  Negative. Fact Sheet for Patients:  https://www.moore.com/ Fact Sheet for Healthcare Providers:  https://www.young.biz/ This test is not yet ap proved or cleared by the [place] FDA and  has been authorized for detection and/or diagnosis of SARS-CoV-2 by FDA under an Emergency Use Authorization (EUA). This EUA will remain  in effect (meaning this test can be used) for the duration of the COVID-19 declaration under Section 564(b)(1) of the Act, 21 U.S.C. section 360bbb-3(b)(1), unless the authorization is terminated or revoked sooner.    Influenza A by PCR NEGATIVE NEGATIVE   Influenza B by PCR NEGATIVE NEGATIVE    Comment: (NOTE) The Xpert Xpress SARS-CoV-2/FLU/RSV assay is intended as an aid in  the diagnosis of influenza from Nasopharyngeal swab specimens and  should not be used as a sole basis for treatment. Nasal washings and  aspirates are unacceptable for Xpert Xpress SARS-CoV-2/FLU/RSV  testing. Fact Sheet for Patients: https://www.moore.com/ Fact Sheet for Healthcare Providers: https://www.young.biz/ This test is not yet approved or cleared by the [place] FDA and  has been authorized for detection and/or diagnosis of SARS-CoV-2 by  FDA under an Emergency Use Authorization (EUA). This EUA will remain  in effect (meaning this test can be used) for the duration of the  Covid-19 declaration under Section 564(b)(1) of the Act, 21  U.S.C. section 360bbb-3(b)(1), unless the authorization is  terminated or revoked. Performed at [facility], [address] [place], [place] [address]   Urinalysis, Routine w reflex microscopic     Status: Abnormal   Collection Time: 08/28/19  4:26 PM  Result Value Ref Range   Color, Urine AMBER (A) YELLOW    Comment: BIOCHEMICALS MAY BE AFFECTED BY COLOR   APPearance CLEAR CLEAR   Specific Gravity, Urine 1.012 1.005 - 1.030   pH 6.0 5.0 -  8.0   Glucose, UA NEGATIVE NEGATIVE mg/dL   Hgb urine dipstick MODERATE (A) NEGATIVE   Bilirubin Urine NEGATIVE NEGATIVE   Ketones, ur NEGATIVE NEGATIVE mg/dL   Protein, ur 30 (A) NEGATIVE mg/dL   Nitrite NEGATIVE NEGATIVE   Leukocytes,Ua NEGATIVE NEGATIVE   RBC / HPF 0-5 0 - 5 RBC/hpf   WBC, UA 0-5 0 - 5 WBC/hpf   Bacteria, UA NONE SEEN NONE SEEN   Hyaline Casts, UA PRESENT     Comment: Performed at [facility], [address] [place], [place] [address]  Lactic acid, plasma     Status: Abnormal   Collection Time: 08/28/19  4:46 PM  Result Value Ref Range   Lactic Acid, Venous 9.7 (HH) 0.5 - 1.9 mmol/L    Comment: CRITICAL RESULT CALLED TO, READ BACK BY AND VERIFIED WITH: BOWEN,M. RN @1741  08/28/19 BILLINGSLEY,L Performed at [facility], [address] [place], [place] [address]   Blood gas, venous     Status: Abnormal   Collection Time: 08/28/19  8:10 PM  Result Value Ref Range   pH, Ven 7.476 (H) 7.250 - 7.430   pCO2, Ven 33.2 (L) 44.0 - 60.0 mmHg   pO2, Ven LESS THAN REPORTABLE RANGE  32.0 - 45.0 mmHg    Comment: CRITICAL RESULT CALLED TO, READ BACK BY AND VERIFIED WITH: RN Darrell JewelS LEONARD AT 2020 08/28/19 CRUICKSHANK A    Bicarbonate 24.2 20.0 - 28.0 mmol/L   Acid-Base Excess 1.3 0.0 - 2.0 mmol/L   O2 Saturation 48.9 %   Patient temperature 98.6     Comment: Performed at [facility], [address] [place], [place] [address]  CBC     Status: Abnormal   Collection Time: 08/29/19  4:30 AM  Result Value Ref Range   WBC 5.7 4.0 - 10.5 K/uL   RBC 2.63 (L) 4.22 - 5.81 MIL/uL   Hemoglobin 9.3 (L) 13.0 - 17.0 g/dL   HCT 29.526.6 (L) 62.139.0 - 30.852.0 %   MCV 101.1 (H) 80.0 - 100.0 fL   MCH 35.4 (H) 26.0 - 34.0 pg   MCHC 35.0 30.0 - 36.0 g/dL   RDW 65.713.0 84.611.5 - 96.215.5 %   Platelets 11 (LL) 150 - 400 K/uL    Comment: CRITICAL VALUE NOTED.  VALUE IS CONSISTENT WITH PREVIOUSLY REPORTED AND CALLED VALUE. Immature Platelet Fraction may  be clinically indicated, consider ordering this additional test [id number] REPEATED TO VERIFY    nRBC 0.3 (H) 0.0 - 0.2 %    Comment: Performed at [facility], [address] [place], [place] [address]  Comprehensive metabolic panel     Status: Abnormal   Collection Time: 08/29/19  4:30 AM  Result Value Ref Range   Sodium 129 (L) 135 - 145 mmol/L   Potassium <2.0 (LL) 3.5 -  5.1 mmol/L    Comment: CRITICAL RESULT CALLED TO, READ BACK BY AND VERIFIED WITH: COREY JACKSON @ [phone] ON 08/29/19 C VARNER    Chloride 89 (L) 98 - 111 mmol/L   CO2 27 22 - 32 mmol/L   Glucose, Bld 134 (H) 70 - 99 mg/dL    Comment: Glucose reference range applies only to samples taken after fasting for at least 8 hours.   BUN 13 6 - 20 mg/dL   Creatinine, Ser 9.60 0.61 - 1.24 mg/dL   Calcium 7.0 (L) 8.9 - 10.3 mg/dL   Total Protein 5.5 (L) 6.5 - 8.1 g/dL   Albumin 2.6 (L) 3.5 - 5.0 g/dL   AST 454 (H) 15 - 41 U/L   ALT 84 (H) 0 - 44 U/L   Alkaline Phosphatase 70 38 - 126 U/L   Total Bilirubin 5.6 (H) 0.3 - 1.2 mg/dL   GFR calc non Af Amer >60 >60 mL/min   GFR calc Af Amer >60 >60 mL/min   Anion gap 13 5 - 15    Comment: Performed at [facility], [address] [place], [place] [address]  Magnesium     Status: Abnormal   Collection Time: 08/29/19  4:30 AM  Result Value Ref Range   Magnesium 3.3 (H) 1.7 - 2.4 mg/dL    Comment: Performed at [facility], [address], [place], [place] [address]  Phosphorus     Status: Abnormal   Collection Time: 08/29/19  4:30 AM  Result Value Ref Range   Phosphorus 1.3 (L) 2.5 - 4.6 mg/dL    Comment: Performed at [facility], [address] [place], [place] [address]  APTT     Status: Abnormal   Collection Time: 08/29/19  4:30 AM  Result Value Ref Range   aPTT 48 (H) 24 - 36 seconds    Comment:        IF BASELINE aPTT IS ELEVATED, SUGGEST PATIENT RISK ASSESSMENT BE USED TO DETERMINE  APPROPRIATE ANTICOAGULANT THERAPY. Performed at [facility], [address], [place], [place] [address]   Protime-INR     Status: Abnormal   Collection Time: 08/29/19  4:30 AM  Result Value Ref Range   Prothrombin Time 16.3 (H) 11.4 - 15.2 seconds   INR 1.4 (H) 0.8 - 1.2    Comment: (NOTE) INR goal varies based on device and disease states. Performed at [facility], [address] [place], [place] [address]   Hepatitis panel, acute     Status: Abnormal   Collection Time: 08/29/19  4:30 AM  Result Value Ref Range   Hepatitis B Surface Ag NON REACTIVE NON REACTIVE   HCV Ab Reactive (A) NON REACTIVE    Comment: (NOTE) The CDC recommends that a Reactive HCV antibody result be followed up  with a HCV Nucleic Acid Amplification test.    Hep A IgM NON REACTIVE NON REACTIVE   Hep B C IgM NON REACTIVE NON REACTIVE    Comment: Performed at [facility], [address], [place], [place] [address]  Type and screen [facility]     Status: None   Collection Time: 08/29/19  4:30 AM  Result Value Ref Range   ABO/RH(D) O POS    Antibody Screen NEG    Sample Expiration      09/01/2019,2359 Performed at [facility], [address] [place], [place] [address]   ABO/Rh  Status: None   Collection Time: 08/29/19  4:30 AM  Result Value Ref Range   ABO/RH(D)      O POS Performed at [facility], [place] [address] [place], [place] [address]   Reticulocytes     Status: Abnormal   Collection Time: 08/29/19  4:30 AM  Result Value Ref Range   Retic Ct Pct 1.1 0.4 - 3.1 %   RBC. 2.60 (L) 4.22 - 5.81 MIL/uL   Retic Count, Absolute 29.4 19.0 - 186.0 K/uL   Immature Retic Fract 15.6 2.3 - 15.9 %    Comment: Performed at [facility], [place] [address], [place], [place] [address]  Save Smear     Status: None   Collection Time: 08/29/19  4:30 AM  Result Value Ref Range   Smear  Review SMEAR STAINED AND AVAILABLE FOR REVIEW     Comment: Performed at [facility], [place] [address], [place], [place] [address]  Pathologist smear review     Status: None   Collection Time: 08/29/19  4:30 AM  Result Value Ref Range   Path Review Reviewed By Violet Baldy, M.D.     Comment: 4.30.2021 NORMOCYTIC ANEMIA AND THROMBOCYTOPENIA Performed at [place] [address] [place], [place] [address]   Lactic acid, plasma     Status: Abnormal   Collection Time: 08/29/19 10:02 AM  Result Value Ref Range   Lactic Acid, Venous 3.4 (HH) 0.5 - 1.9 mmol/L    Comment: CRITICAL VALUE NOTED.  VALUE IS CONSISTENT WITH PREVIOUSLY REPORTED AND CALLED VALUE. Performed at [facility], [place] [address], [place], [place] [address]   CBG monitoring, ED     Status: Abnormal   Collection Time: 08/29/19 10:08 AM  Result Value Ref Range   Glucose-Capillary 169 (H) 70 - 99 mg/dL    Comment: Glucose reference range applies only to samples taken after fasting for at least 8 hours.  HIV Antibody (routine testing w rflx)     Status: None   Collection Time: 08/29/19 10:33 AM  Result Value Ref Range   HIV Screen 4th Generation wRfx Non Reactive Non Reactive    Comment: Performed at [facility], [address] [place], [place] [address]  Vitamin B12     Status: Abnormal   Collection Time: 08/29/19 10:33 AM  Result Value Ref Range   Vitamin B-12 1,178 (H) 180 - 914 pg/mL    Comment: (NOTE) This assay is not validated for testing neonatal or myeloproliferative syndrome specimens for Vitamin B12 levels. Performed at [facility], [place] [address] [place],  [address]   Folate     Status: None   Collection Time: 08/29/19 10:33 AM  Result Value Ref Range   Folate 6.1 >5.9 ng/mL    Comment: Performed at [facility], [place] [address], [place], [place] [address]  Iron and TIBC     Status: Abnormal     Collection Time: 08/29/19 10:33 AM  Result Value Ref Range   Iron 154 45 - 182 ug/dL   TIBC 166 (L) 250 - 450 ug/dL   Saturation Ratios 93 (H) 17.9 - 39.5 %   UIBC 12 ug/dL    Comment: Performed at [facility], [place] [address] [place], [place] [address]  Ferritin     Status: Abnormal   Collection Time: 08/29/19 10:33 AM  Result Value Ref Range   Ferritin 4,953 (H) 24 -  336 ng/mL    Comment: Performed at [facility], [address] [place], [place] [address]  Basic metabolic panel     Status: Abnormal   Collection Time: 08/29/19 10:33 AM  Result Value Ref Range   Sodium 129 (L) 135 - 145 mmol/L   Potassium 2.2 (LL) 3.5 - 5.1 mmol/L    Comment: CRITICAL RESULT CALLED TO, READ BACK BY AND VERIFIED WITH: WILSON,K. RN AT 1111 08/29/19 MULLINS,T    Chloride 93 (L) 98 - 111 mmol/L   CO2 24 22 - 32 mmol/L   Glucose, Bld 167 (H) 70 - 99 mg/dL    Comment: Glucose reference range applies only to samples taken after fasting for at least 8 hours.   BUN 14 6 - 20 mg/dL   Creatinine, Ser 1.91 0.61 - 1.24 mg/dL   Calcium 7.1 (L) 8.9 - 10.3 mg/dL   GFR calc non Af Amer >60 >60 mL/min   GFR calc Af Amer >60 >60 mL/min   Anion gap 12 5 - 15    Comment: Performed at [facility], [address], [place], [place] [address]  Lactate dehydrogenase     Status: Abnormal   Collection Time: 08/29/19 10:33 AM  Result Value Ref Range   LDH 483 (H) 98 - 192 U/L    Comment: Performed at [facility], [address], [place], [place] [address]  Hepatic function panel     Status: Abnormal   Collection Time: 08/29/19 10:33 AM  Result Value Ref Range   Total Protein 5.6 (L) 6.5 - 8.1 g/dL   Albumin 2.5 (L) 3.5 - 5.0 g/dL   AST 086 (H) 15 - 41 U/L   ALT 85 (H) 0 - 44 U/L   Alkaline Phosphatase 69 38 - 126 U/L   Total Bilirubin 6.3 (H) 0.3 - 1.2 mg/dL   Bilirubin, Direct 3.6 (H) 0.0 - 0.2 mg/dL   Indirect Bilirubin 2.7 (H) 0.3  - 0.9 mg/dL    Comment: Performed at [facility], [address], [place], [place] [address]  Lactic acid, plasma     Status: Abnormal   Collection Time: 08/29/19  1:26 PM  Result Value Ref Range   Lactic Acid, Venous 2.8 (HH) 0.5 - 1.9 mmol/L    Comment: CRITICAL VALUE NOTED.  VALUE IS CONSISTENT WITH PREVIOUSLY REPORTED AND CALLED VALUE. Performed at [facility], [address] [place], [place] [address]   Urine rapid drug screen (hosp performed)     Status: Abnormal   Collection Time: 08/29/19  2:02 PM  Result Value Ref Range   Opiates NONE DETECTED NONE DETECTED   Cocaine NONE DETECTED NONE DETECTED   Benzodiazepines POSITIVE (A) NONE DETECTED   Amphetamines NONE DETECTED NONE DETECTED   Tetrahydrocannabinol NONE DETECTED NONE DETECTED   Barbiturates NONE DETECTED NONE DETECTED    Comment: (NOTE) DRUG SCREEN FOR MEDICAL PURPOSES ONLY.  IF CONFIRMATION IS NEEDED FOR ANY PURPOSE, NOTIFY LAB WITHIN 5 DAYS. LOWEST DETECTABLE LIMITS FOR URINE DRUG SCREEN Drug Class                     Cutoff (ng/mL) Amphetamine and metabolites    1000 Barbiturate and metabolites    200 Benzodiazepine                 200 Tricyclics and metabolites     300 Opiates and metabolites        300 Cocaine and  metabolites        300 THC                            50 Performed at [facility], [address] [place], [place] [address]     MICRO:  IMAGING: CT HEAD WO CONTRAST  Result Date: 08/29/2019 CLINICAL DATA:  Fall with headache.  Thrombocytopenia EXAM: CT HEAD WITHOUT CONTRAST TECHNIQUE: Contiguous axial images were obtained from the base of the skull through the vertex without intravenous contrast. COMPARISON:  04/10/2018 FINDINGS: Brain: No evidence of swelling, infarction, hemorrhage, hydrocephalus, extra-axial collection or mass lesion/mass effect. Dilated perivascular spaces at/below the basal ganglia. Vascular: No hyperdense  vessel or unexpected calcification. Skull: Normal. Negative for fracture or focal lesion. Sinuses/Orbits: No evidence of injury Other: Motion degradation requiring 2 acquisitions. IMPRESSION: No evidence of intracranial injury. Electronically Signed   By: Marnee Spring M.D.   On: 08/29/2019 06:05   CT ABDOMEN PELVIS W CONTRAST  Result Date: 08/28/2019 CLINICAL DATA:  Abdominal pain, hematemesis EXAM: CT ABDOMEN AND PELVIS WITH CONTRAST TECHNIQUE: Multidetector CT imaging of the abdomen and pelvis was performed using the standard protocol following bolus administration of intravenous contrast. CONTRAST:  OMNIPAQUE IOHEXOL 300 MG/ML  SOLN COMPARISON:  08/26/2017 FINDINGS: Lower chest: No acute abnormality. Hepatobiliary: Marked diffuse hepatic steatosis. No focal hepatic lesion is identified. Gallbladder appears unremarkable. No hyperdense gallstone. No biliary dilatation. Pancreas: Unremarkable. No pancreatic ductal dilatation or surrounding inflammatory changes. Spleen: Normal in size without focal abnormality. Adrenals/Urinary Tract: 6 mm upper pole right renal calculus. Punctate 2 mm calculus within the midpole of the left kidney. Kidneys enhance symmetrically. No renal lesion or hydronephrosis. Bilateral ureters are unremarkable. Urinary bladder is unremarkable. No adrenal nodule. Stomach/Bowel: Stomach is within normal limits. Appendix appears normal. No evidence of bowel wall thickening, distention, or inflammatory changes. Vascular/Lymphatic: Scattered atherosclerotic calcifications of the abdominal aorta. No aneurysm. No abdominopelvic lymphadenopathy. Reproductive: Prostate is unremarkable. Other: No abdominal wall hernia or abnormality. No abdominopelvic ascites. Musculoskeletal: Chronic moderate superior endplate compression deformity of L2 with mild bony retropulsion, unchanged from prior. No new or acute osseous findings. IMPRESSION: 1. No acute abdominopelvic findings. 2. Marked diffuse  hepatic steatosis. 3. Bilateral nonobstructive nephrolithiasis. 4. Chronic superior endplate compression deformity of L2 with mild bony retropulsion. 5. Aortic atherosclerosis. (ICD10-I70.0). Electronically Signed   By: Duanne Guess D.O.   On: 08/28/2019 16:54   US Abdomen Limited  Result Date: 08/28/2019 CLINICAL DATA:  Right upper quadrant pain; septic; significant ETOH use. EXAM: ULTRASOUND ABDOMEN LIMITED RIGHT UPPER QUADRANT COMPARISON:  CT abdomen/pelvis 08/28/2019, abdominal ultrasound 04/17/2006. FINDINGS: Gallbladder: There is sludge within the gallbladder. No gallstones are visualized. No appreciable gallbladder wall thickening. No sonographic Eulah Pont sign is elicited by the scanning technologist. Common bile duct: Diameter: The common duct is nonvisualized due to obscuration by overlying bowel gas. Liver: No focal lesion identified. Generalized increased hepatic parenchymal echogenicity. Portal vein is patent on color Doppler imaging with normal direction of blood flow towards the liver. IMPRESSION: Gallbladder sludge. No appreciable gallstones or gallbladder wall thickening. The common duct is non-visualized due to obscuration by overlying bowel gas. Hyperechogenicity of the hepatic parenchyma consistent with marked hepatic steatosis demonstrated on same day CT abdomen/pelvis. Electronically Signed   By: Jackey Loge DO   On: 08/28/2019 19:30   DG Chest Port 1 View  Result Date: 08/28/2019 CLINICAL DATA:  Sepsis weakness EXAM: PORTABLE CHEST 1 VIEW  COMPARISON:  09/23/2018, CT 08/26/2017, radiograph 05/26/2016 FINDINGS: Low lung volumes. Mildly elevated right diaphragm. No consolidation or effusion. Heart size within normal limits allowing for low lung volume. No pneumothorax.nodular opacity in the left mid lung probably represents vessel on end. IMPRESSION: Low lung volumes. Nodular opacity in the left mid lung probably represents vascular artifact. Suggest short interval two-view chest  radiograph follow-up. Electronically Signed   By: Jasmine Pang M.D.   On: 08/28/2019 15:01   ECHOCARDIOGRAM COMPLETE  Result Date: 08/29/2019    ECHOCARDIOGRAM REPORT   Patient Name:   Patrick [name] Date of Exam: 08/29/2019 Medical Rec #:  [id number]    Height:       71.0 in Accession #:    [id number]   Weight:       180.0 lb Date of Birth:  May 18, 1975     BSA:          2.016 m Patient Age:    43 years     BP:           142/102 mmHg Patient Gender: M            HR:           104 bpm. Exam Location:  Inpatient Procedure: 2D Echo, Cardiac Doppler and Color Doppler Indications:    CHF  History:        Patient has no prior history of Echocardiogram examinations.                 Risk Factors:Hypertension and Current Smoker. Alcohol abuse.  Sonographer:    Lavenia Atlas Referring Phys: [phone] DANIEL V THOMPSON  Sonographer Comments: Image acquisition challenging due to uncooperative patient. IMPRESSIONS  1. Left ventricular ejection fraction, by estimation, is 55 to 60%. The left ventricle has normal function. The left ventricle has no regional wall motion abnormalities. Left ventricular diastolic parameters are consistent with Grade I diastolic dysfunction (impaired relaxation).  2. Right ventricular systolic function is normal. The right ventricular size is normal.  3. Left atrial size was mildly dilated.  4. The mitral valve is normal in structure. No evidence of mitral valve regurgitation. No evidence of mitral stenosis.  5. The aortic valve is tricuspid. Aortic valve regurgitation is not visualized. Mild aortic valve sclerosis is present, with no evidence of aortic valve stenosis.  6. The inferior vena cava is normal in size with greater than 50% respiratory variability, suggesting right atrial pressure of 3 mmHg. FINDINGS  Left Ventricle: Left ventricular ejection fraction, by estimation, is 55 to 60%. The left ventricle has normal function. The left ventricle has no regional wall motion abnormalities. The left  ventricular internal cavity size was normal in size. There is  no left ventricular hypertrophy. Left ventricular diastolic parameters are consistent with Grade I diastolic dysfunction (impaired relaxation). Right Ventricle: The right ventricular size is normal. No increase in right ventricular wall thickness. Right ventricular systolic function is normal. Left Atrium: Left atrial size was mildly dilated. Right Atrium: Right atrial size was normal in size. Pericardium: There is no evidence of pericardial effusion. Mitral Valve: The mitral valve is normal in structure. Normal mobility of the mitral valve leaflets. No evidence of mitral valve regurgitation. No evidence of mitral valve stenosis. Tricuspid Valve: The tricuspid valve is normal in structure. Tricuspid valve regurgitation is not demonstrated. No evidence of tricuspid stenosis. Aortic Valve: The aortic valve is tricuspid. Aortic valve regurgitation is not visualized. Mild aortic valve sclerosis is present, with no evidence of aortic  valve stenosis. Pulmonic Valve: The pulmonic valve was normal in structure. Pulmonic valve regurgitation is not visualized. No evidence of pulmonic stenosis. Aorta: The aortic root is normal in size and structure. Venous: The inferior vena cava is normal in size with greater than 50% respiratory variability, suggesting right atrial pressure of 3 mmHg. IAS/Shunts: The interatrial septum was not well visualized.  LEFT VENTRICLE PLAX 2D LVIDd:         4.90 cm  Diastology LVIDs:         3.40 cm  LV e' lateral:   10.00 cm/s LV PW:         1.20 cm  LV E/e' lateral: 7.0 LV IVS:        1.10 cm  LV e' medial:    6.85 cm/s LVOT diam:     2.30 cm  LV E/e' medial:  10.3 LV SV:         48 LV SV Index:   24 LVOT Area:     4.15 cm  RIGHT VENTRICLE RV Basal diam:  2.40 cm RV S prime:     6.31 cm/s TAPSE (M-mode): 3.3 cm LEFT ATRIUM             Index       RIGHT ATRIUM           Index LA diam:        3.80 cm 1.88 cm/m  RA Area:     12.00 cm LA  Vol (A2C):   31.7 ml 15.72 ml/m RA Volume:   22.40 ml  11.11 ml/m LA Vol (A4C):   52.2 ml 25.89 ml/m LA Biplane Vol: 43.8 ml 21.72 ml/m  AORTIC VALVE LVOT Vmax:   71.80 cm/s LVOT Vmean:  42.900 cm/s LVOT VTI:    0.115 m  AORTA Ao Root diam: 2.80 cm MITRAL VALVE MV Area (PHT): 10.25 cm   SHUNTS MV Decel Time: 74 msec     Systemic VTI:  0.12 m MV E velocity: 70.30 cm/s  Systemic Diam: 2.30 cm MV A velocity: 88.70 cm/s MV E/A ratio:  0.79 Charlton Haws MD Electronically signed by Charlton Haws MD Signature Date/Time: 08/29/2019/1:12:18 PM    Final    [place] EKG SITE RITE  Result Date: 08/29/2019 If Site Rite image not attached, placement could not be confirmed due to current cardiac rhythm.  [place] EKG SITE RITE  Result Date: 08/29/2019 If Site Rite image not attached, placement could not be confirmed due to current cardiac rhythm.  Assessment/Plan:  44yo M with etoh abuse, htn, admitted for sepsis, AMS 2/2 to MSSA bacteremia - recommend to narrow abtx to cefazolin 2gm IV q8hr - repeat blood cx tomorrow - start with TTE but would recommend to also get TEE if TTE is negative - would need imaging of right hip once stabilized - consider getting CK to see if significantly elevated given that he was down/altered from fall - would wait on picc line until blood cx are cleared - hypokalemia persists defer to primary team but more aggressive since needs repletion.  - heavy alcohol use = high risk for withdrawal  - ? Possible UGIB/coffee ground emesis = once stable, would recommend getting EGD to see if he has any varices  Hep C ab + = recommend to get hep C viral load  Dr Zenaida Niece dam available for question

## 2019-08-29 NOTE — Progress Notes (Signed)
  Echocardiogram 2D Echocardiogram has been performed.  Patrick [name] 08/29/2019, 12:43 PM

## 2019-08-29 NOTE — Progress Notes (Signed)
Pt is in the Yellow MEWS d/t elevated HR:104; elevated RR: 22. Charge RN notified and MD notified. Pt awaiting placement for a PICC line in order to administer IV medications. Will continue to monitor closely.

## 2019-08-29 NOTE — Consult Note (Addendum)
Patrick [name]  [phone] [phone]  I have seen him, examined him and agreed with documentation as follows  INITIAL HEMATOLOGY CONSULTATION  Referring MD:  Dr. Irine Seal  Reason for Referral: Thrombocytopenia  HPI: Patrick [name] is a 44 year old male with a past medical history significant for hypertension, eczema, migraine headaches, alcohol abuse, and nicotine dependence.  The patient was brought to the emergency room by EMS after being found lying on the ground outside of his home covered in feces.  Patient was found to have some blood around his mouth and reportedly bit his tongue.  Unsure if he had a seizure.  The patient was given IV fluids in the ER.  He was noted to have a fever of 101.2, was tachycardic, and had a lactic acid greater than 11.  LFTs and total bilirubin were elevated on admission.  Platelet count was low at 17,000 (most recent CBC available to me was performed on 04/10/2018 and his platelet count was 124,000 at that time-platelet count from 2018 and prior was in the 150s to 160s). He had mild anemia hemoglobin of 11.9.  He had significant hypokalemia on admission as well.  Ethanol level was elevated at 76.  Hepatitis panel showed positive HCV antibody.  Absolute reticulocyte count was normal at 29.4 with a normal immature reticulocyte fraction of 15.6%.  LDH elevated at 483.  Haptoglobin pending.  Hepatic function panel performed earlier today showed a total bilirubin of 6.3 with a direct bilirubin of 3.6 and indirect bilirubin of 2.7.  CT of the abdomen pelvis with contrast performed on admission showed no acute abdominal pelvic findings, marked diffuse hepatic steatosis.  Blood culture results this morning showed gram-positive cocci in clusters consistent with Staphylococcus.  When seen today, the patient is awake but somewhat groggy.  He is a poor historian.  He cannot fully remove the events surrounding being brought to the hospital.   He reports that he noted some bleeding from his tongue because he bit his tongue.  He is not currently having any bleeding.  He denies other complaints today.  The patient reports that he smoked a pack of cigarettes per day since high school but quit 10 to 15 days ago.  He reports that he drinks alcohol daily -approximately 6-10 beers and also 6 shots of hard liquor per day.  He admits that the last time he drank alcohol was this morning  hematology was asked see the patient to make recommendations regarding his thrombocytopenia.   Past Medical History:  Diagnosis Date  . Eczema   . Hypertension   . Migraine headache   :    History reviewed. No pertinent surgical history.:   CURRENT MEDS: Current Facility-Administered Medications  Medication Dose Route Frequency Provider Last Rate Last Admin  . 0.9 % NaCl with KCl 40 mEq / L  infusion   Intravenous Continuous Jenkins Rouge, MD 125 mL/hr at 08/29/19 0659 125 mL/hr at 08/29/19 0659  . acetaminophen (TYLENOL) tablet 650 mg  650 mg Oral Q6H PRN Jenkins Rouge, MD       Or  . acetaminophen (TYLENOL) suppository 650 mg  650 mg Rectal Q6H PRN Rawla, Prashanth, MD      . ceFAZolin (ANCEF) IVPB 2g/100 mL premix  2 g Intravenous NOW Swayne, Mary M, RPH      . ceFAZolin (ANCEF) IVPB 2g/100 mL premix  2 g Intravenous Q8H Swayne, Mary M, RPH      . lactated ringers infusion  Intravenous Continuous Juanito Doom, MD 75 mL/hr at 08/28/19 2009 New Bag at 08/28/19 2009  . lisinopril (ZESTRIL) tablet 5 mg  5 mg Oral Daily Rawla, Prashanth, MD      . LORazepam (ATIVAN) tablet 1-4 mg  1-4 mg Oral Q1H PRN Jenkins Rouge, MD       Or  . LORazepam (ATIVAN) injection 1-4 mg  1-4 mg Intravenous Q1H PRN Rawla, Prashanth, MD      . nicotine (NICODERM CQ - dosed in mg/24 hours) patch 21 mg  21 mg Transdermal Daily Rawla, Prashanth, MD   21 mg at 08/29/19 1047  . ondansetron (ZOFRAN) tablet 4 mg  4 mg Oral Q6H PRN Rawla, Ethelene Hal, MD       Or  .  ondansetron (ZOFRAN) injection 4 mg  4 mg Intravenous Q6H PRN Rawla, Prashanth, MD      . pantoprazole (PROTONIX) injection 40 mg  40 mg Intravenous Q12H Rawla, Prashanth, MD   40 mg at 08/29/19 0400  . potassium chloride 10 mEq in 100 mL IVPB  10 mEq Intravenous Q1 Hr x 4 Eugenie Filler, MD      . potassium chloride SA (KLOR-CON) CR tablet 40 mEq  40 mEq Oral Q4H Eugenie Filler, MD   40 mEq at 08/29/19 1045  . potassium PHOSPHATE 30 mmol in dextrose 5 % 500 mL infusion  30 mmol Intravenous Once Eugenie Filler, MD      . senna-docusate (Senokot-S) tablet 1 tablet  1 tablet Oral QHS PRN Jenkins Rouge, MD       Current Outpatient Medications  Medication Sig Dispense Refill  . ibuprofen (ADVIL,MOTRIN) 200 MG tablet Take 200-800 mg by mouth every 6 (six) hours as needed for moderate pain.    Marland Kitchen lisinopril (ZESTRIL) 5 MG tablet Take 5 mg by mouth daily.    . Multiple Vitamin (MULTIVITAMIN PO) Take 1 tablet by mouth daily as needed (nutrition).    Marland Kitchen acetaminophen (TYLENOL) 325 MG tablet Take 3 tablets (975 mg total) by mouth every 6 (six) hours as needed for mild pain. (Patient not taking: Reported on 04/10/2018)    . docusate sodium (COLACE) 100 MG capsule Take 1 capsule (100 mg total) by mouth 2 (two) times daily. (Patient not taking: Reported on 04/10/2018) 10 capsule 0  . lisinopril (PRINIVIL,ZESTRIL) 5 MG tablet Take 1 tablet (5 mg total) by mouth daily. 30 tablet 0  . methocarbamol (ROBAXIN) 500 MG tablet Take 1 tablet (500 mg total) by mouth every 8 (eight) hours as needed for muscle spasms. (Patient not taking: Reported on 04/10/2018) 20 tablet 0  . Oxycodone HCl 10 MG TABS Take 1 tablet (10 mg total) by mouth every 4 (four) hours as needed. (Patient not taking: Reported on 04/10/2018) 20 tablet 0      Allergies  Allergen Reactions  . Mushroom Extract Complex Diarrhea and Other (See Comments)    Itchy throat  :  History reviewed. No pertinent family history.:  Social  History   Socioeconomic History  . Marital status: Single    Spouse name: Not on file  . Number of children: Not on file  . Years of education: Not on file  . Highest education level: Not on file  Occupational History  . Not on file  Tobacco Use  . Smoking status: Current Every Day Smoker    Packs/day: 1.00    Types: Cigarettes  . Smokeless tobacco: Never Used  Substance and Sexual Activity  . Alcohol use:  Yes    Comment: every day depends on day of week as to how much  . Drug use: Yes    Types: Marijuana    Comment: every day  . Sexual activity: Not on file  Other Topics Concern  . Not on file  Social History Narrative  . Not on file   Social Determinants of Health   Financial Resource Strain:   . Difficulty of Paying Living Expenses:   Food Insecurity:   . Worried About Charity fundraiser in the Last Year:   . Arboriculturist in the Last Year:   Transportation Needs:   . Film/video editor (Medical):   Marland Kitchen Lack of Transportation (Non-Medical):   Physical Activity:   . Days of Exercise per Week:   . Minutes of Exercise per Session:   Stress:   . Feeling of Stress :   Social Connections:   . Frequency of Communication with Friends and Family:   . Frequency of Social Gatherings with Friends and Family:   . Attends Religious Services:   . Active Member of Clubs or Organizations:   . Attends Archivist Meetings:   Marland Kitchen Marital Status:   Intimate Partner Violence:   . Fear of Current or Ex-Partner:   . Emotionally Abused:   Marland Kitchen Physically Abused:   . Sexually Abused:   :  REVIEW OF SYSTEMS: A comprehensive 14 point review of system was negative symptoms noted in the HPI.  Exam: Patient Vitals for the past 24 hrs:  BP Temp Temp src Pulse Resp SpO2  08/29/19 1033 (!) 140/101 100.3 F (37.9 C) Oral 95 17 97 %  08/29/19 0907 -- 98.3 F (36.8 C) Oral -- -- --  08/29/19 0901 131/84 -- -- (!) 112 20 100 %  08/29/19 0700 137/88 -- -- (!) 106 (!) 23 97 %   08/29/19 0500 (!) 121/107 -- -- (!) 102 18 97 %  08/29/19 0430 (!) 138/96 -- -- 98 17 97 %  08/29/19 0400 (!) 147/108 -- -- 98 15 97 %  08/29/19 0200 129/85 -- -- (!) 104 15 96 %  08/29/19 0130 (!) 132/109 -- -- (!) 108 16 100 %  08/29/19 0100 122/89 -- -- -- 19 100 %  08/29/19 0030 (!) 128/101 -- -- 98 14 100 %  08/29/19 0000 (!) 123/98 -- -- (!) 101 15 100 %  08/28/19 2300 (!) 134/113 -- -- 96 15 95 %  08/28/19 2100 (!) 128/99 -- -- (!) 103 15 100 %  08/28/19 1930 (!) 110/93 -- -- (!) 105 17 100 %  08/28/19 1830 108/71 -- -- (!) 115 (!) 21 95 %  08/28/19 1800 (!) 120/107 -- -- (!) 115 (!) 21 95 %  08/28/19 1730 (!) 119/97 -- -- (!) 117 (!) 21 100 %  08/28/19 1700 (!) 137/108 -- -- (!) 118 (!) 21 100 %  08/28/19 1615 (!) 143/113 -- -- (!) 113 (!) 27 100 %  08/28/19 1600 (!) 169/123 -- -- (!) 116 (!) 26 100 %  08/28/19 1530 (!) 143/108 -- -- (!) 112 20 100 %  08/28/19 1511 117/87 -- -- (!) 110 18 100 %  08/28/19 1415 (!) 158/116 -- -- (!) 114 18 100 %  08/28/19 1400 (!) 150/98 -- -- (!) 116 (!) 29 100 %  08/28/19 1349 -- (!) 101.2 F (38.4 C) Rectal -- -- --  08/28/19 1345 -- -- -- (!) 119 (!) 23 100 %  08/28/19 1330 -- -- -- Marland Kitchen  122 19 97 %  08/28/19 1315 -- -- -- (!) 117 (!) 24 99 %  08/28/19 1300 -- -- -- (!) 121 (!) 23 100 %  08/28/19 1245 (!) 144/90 -- -- (!) 118 (!) 28 100 %  08/28/19 1237 (!) 104/91 100.1 F (37.8 C) -- (!) 125 (!) 22 97 %    General: Awake and alert, slow to answer questions at times, no distress.   Eyes:  no scleral icterus.   ENT: Dried blood noted to right ear.  Tongue bite noted to the left side of his tongue.  No fresh blood. Lymphatics:  Negative cervical, supraclavicular or axillary adenopathy. Respiratory: lungs were clear bilaterally without wheezing or crackles. Cardiovascular:  Regular rate and rhythm, S1/S2, without murmur, rub or gallop.  There was no pedal edema.   GI:  abdomen was soft, flat, nontender, nondistended  Skin: No  petechiae Neuro: Awake alert, oriented to person, place, time.  His words are difficult to understand due to his tongue pain  LABS:  Lab Results  Component Value Date   WBC 5.7 08/29/2019   HGB 9.3 (L) 08/29/2019   HCT 26.6 (L) 08/29/2019   PLT 11 (LL) 08/29/2019   GLUCOSE 134 (H) 08/29/2019   ALT 84 (H) 08/29/2019   AST 182 (H) 08/29/2019   NA 129 (L) 08/29/2019   K <2.0 (LL) 08/29/2019   CL 89 (L) 08/29/2019   CREATININE 0.86 08/29/2019   BUN 13 08/29/2019   CO2 27 08/29/2019   INR 1.4 (H) 08/29/2019    CT HEAD WO CONTRAST  Result Date: 08/29/2019 CLINICAL DATA:  Fall with headache.  Thrombocytopenia EXAM: CT HEAD WITHOUT CONTRAST TECHNIQUE: Contiguous axial images were obtained from the base of the skull through the vertex without intravenous contrast. COMPARISON:  04/10/2018 FINDINGS: Brain: No evidence of swelling, infarction, hemorrhage, hydrocephalus, extra-axial collection or mass lesion/mass effect. Dilated perivascular spaces at/below the basal ganglia. Vascular: No hyperdense vessel or unexpected calcification. Skull: Normal. Negative for fracture or focal lesion. Sinuses/Orbits: No evidence of injury Other: Motion degradation requiring 2 acquisitions. IMPRESSION: No evidence of intracranial injury. Electronically Signed   By: Monte Fantasia M.D.   On: 08/29/2019 06:05   CT ABDOMEN PELVIS W CONTRAST  Result Date: 08/28/2019 CLINICAL DATA:  Abdominal pain, hematemesis EXAM: CT ABDOMEN AND PELVIS WITH CONTRAST TECHNIQUE: Multidetector CT imaging of the abdomen and pelvis was performed using the standard protocol following bolus administration of intravenous contrast. CONTRAST:  150m OMNIPAQUE IOHEXOL 300 MG/ML  SOLN COMPARISON:  08/26/2017 FINDINGS: Lower chest: No acute abnormality. Hepatobiliary: Marked diffuse hepatic steatosis. No focal hepatic lesion is identified. Gallbladder appears unremarkable. No hyperdense gallstone. No biliary dilatation. Pancreas: Unremarkable. No  pancreatic ductal dilatation or surrounding inflammatory changes. Spleen: Normal in size without focal abnormality. Adrenals/Urinary Tract: 6 mm upper pole right renal calculus. Punctate 2 mm calculus within the midpole of the left kidney. Kidneys enhance symmetrically. No renal lesion or hydronephrosis. Bilateral ureters are unremarkable. Urinary bladder is unremarkable. No adrenal nodule. Stomach/Bowel: Stomach is within normal limits. Appendix appears normal. No evidence of bowel wall thickening, distention, or inflammatory changes. Vascular/Lymphatic: Scattered atherosclerotic calcifications of the abdominal aorta. No aneurysm. No abdominopelvic lymphadenopathy. Reproductive: Prostate is unremarkable. Other: No abdominal wall hernia or abnormality. No abdominopelvic ascites. Musculoskeletal: Chronic moderate superior endplate compression deformity of L2 with mild bony retropulsion, unchanged from prior. No new or acute osseous findings. IMPRESSION: 1. No acute abdominopelvic findings. 2. Marked diffuse hepatic steatosis. 3. Bilateral nonobstructive nephrolithiasis. 4. Chronic  superior endplate compression deformity of L2 with mild bony retropulsion. 5. Aortic atherosclerosis. (ICD10-I70.0). Electronically Signed   By: Davina Poke D.O.   On: 08/28/2019 16:54   US Abdomen Limited  Result Date: 08/28/2019 CLINICAL DATA:  Right upper quadrant pain; septic; significant ETOH use. EXAM: ULTRASOUND ABDOMEN LIMITED RIGHT UPPER QUADRANT COMPARISON:  CT abdomen/pelvis 08/28/2019, abdominal ultrasound 04/17/2006. FINDINGS: Gallbladder: There is sludge within the gallbladder. No gallstones are visualized. No appreciable gallbladder wall thickening. No sonographic Percell Miller sign is elicited by the scanning technologist. Common bile duct: Diameter: The common duct is nonvisualized due to obscuration by overlying bowel gas. Liver: No focal lesion identified. Generalized increased hepatic parenchymal echogenicity. Portal  vein is patent on color Doppler imaging with normal direction of blood flow towards the liver. IMPRESSION: Gallbladder sludge. No appreciable gallstones or gallbladder wall thickening. The common duct is non-visualized due to obscuration by overlying bowel gas. Hyperechogenicity of the hepatic parenchyma consistent with marked hepatic steatosis demonstrated on same day CT abdomen/pelvis. Electronically Signed   By: Kellie Simmering DO   On: 08/28/2019 19:30   DG Chest Port 1 View  Result Date: 08/28/2019 CLINICAL DATA:  Sepsis weakness EXAM: PORTABLE CHEST 1 VIEW COMPARISON:  09/23/2018, CT 08/26/2017, radiograph 05/26/2016 FINDINGS: Low lung volumes. Mildly elevated right diaphragm. No consolidation or effusion. Heart size within normal limits allowing for low lung volume. No pneumothorax.nodular opacity in the left mid lung probably represents vessel on end. IMPRESSION: Low lung volumes. Nodular opacity in the left mid lung probably represents vascular artifact. Suggest short interval two-view chest radiograph follow-up. Electronically Signed   By: Donavan Foil M.D.   On: 08/28/2019 15:01   I have reviewed the peripheral blood smear Absolute thrombocytopenia is seen No schistocytes No platelet clumping White blood cell morphology is normal with occasional reactive lymphs  ASSESSMENT AND PLAN:   Thrombocytopenia Likely due to bone marrow suppression from alcohol intake, sepsis, and underlying liver disease including hepatitis Recommend platelet transfusion if he is actively bleeding; otherwise, he does not need platelet transfusion unless his platelet count is less than 10,000  Anemia of chronic illness No need transfusion support Observe closely for now The pattern is not consistent with hemolysis  Sepsis Met sepsis criteria on admission Started on broad-spectrum antibiotics and pancultured Blood cultures returned today consistent with Staph aureus -sensitivities pending This can certainly  cause severe pancytopenia  Alcohol abuse Alcohol withdrawal CIWA protocol On thiamine, folic acid, and multivitamin  Hepatic steatosis, hep C antibody 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  Mikey Bussing, DNP, AGPCNP-BC, AOCNP Mon/Tues/Thurs/Fri 7am-5pm; Off Wednesdays Cell: [phone]  Heath Lark, MD

## 2019-08-29 NOTE — Progress Notes (Signed)
Pt in the Yellow MEWS d/t elevated HR: 104; RR: 22; Pt's BP: 142/102. Pt placed on IV fluids and IV potassium. Prn Tylenol given for pain. Will continue to monitor.

## 2019-08-29 NOTE — Progress Notes (Signed)
EEG Completed; Results Pending  

## 2019-08-29 NOTE — TOC Initial Note (Signed)
Transition of Care [facility]) - Initial/Assessment Note    Patient Details  Name: Patrick [name] MRN: [id number] Date of Birth: 09/24/1975  Transition of Care [facility]) CM/SW Contact:    Trish Mage, LCSW Phone Number: 08/29/2019, 4:02 PM  Clinical Narrative:   Met with patient in response to MD consult for substance abuse.  He states he lives alone with his dog, has a girlfriend with whom he spends a lot of time but she has her own place.  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  Has DME walker at home, and blames his fall on "slippery wood floors," denying that alcohol was involved.  "It was first thing in the AM, and I only drink in the AM on weekends."  Says the amount varies day to day, usually beer or wine only, but includes a large bottle of [employer] when getting together with friends.  Says he recently quit cigarettes and cannabis, so knows that if he decides to quit alcohol, he can do it.  Declined offer of inpatient, outpatient resources.  TOC will continue to follow during the course of hospitalization.                Expected Discharge Plan: Home/Self Care Barriers to Discharge: No Barriers Identified   Patient Goals and CMS Choice Patient states their goals for this hospitalization and ongoing recovery are:: go home      Expected Discharge Plan and Services Expected Discharge Plan: Home/Self Care In-house Referral: Clinical Social Work     Living arrangements for the past 2 months: Single Family Home                                      Prior Living Arrangements/Services Living arrangements for the past 2 months: Single Family Home Lives with:: Self Patient language and need for interpreter reviewed:: Yes Do you feel safe going back to the place where you live?: Yes      Need for Family Participation in Patient Care: No (Comment) Care giver support system in place?: Yes (comment)   Criminal Activity/Legal Involvement  Pertinent to Current Situation/Hospitalization: No - Comment as needed  Activities of Daily Living Home Assistive Devices/Equipment: Walker (specify type) ADL Screening (condition at time of admission) Patient's cognitive ability adequate to safely complete daily activities?: Yes Is the patient deaf or have difficulty hearing?: No Does the patient have difficulty seeing, even when wearing glasses/contacts?: No Does the patient have difficulty concentrating, remembering, or making decisions?: No Patient able to express need for assistance with ADLs?: Yes Does the patient have difficulty dressing or bathing?: No Independently performs ADLs?: Yes (appropriate for developmental age) Does the patient have difficulty walking or climbing stairs?: No Weakness of Legs: Both Weakness of Arms/Hands: Both  Permission Sought/Granted                  Emotional Assessment Appearance:: Appears stated age Attitude/Demeanor/Rapport: Engaged Affect (typically observed): Appropriate Orientation: : Oriented to Self, Oriented to Place, Oriented to Situation Alcohol / Substance Use: Alcohol Use Psych Involvement: No (comment)  Admission diagnosis:  Alcohol withdrawal ([place]) [F10.239] Hypokalemia [E87.6] Transaminitis [R74.01] Sepsis with acute liver failure without hepatic coma or septic shock, due to unspecified organism ([place]) [A41.9, R65.20, K72.00] Patient Active Problem List   Diagnosis Date Noted  . Alcohol withdrawal ([place]) 08/28/2019  . Closed compression fracture  of L2 lumbar vertebra, initial encounter ([place]) 08/26/2017  . Behavior change due to substance use 02/27/2017  . Alcohol use disorder, severe, dependence ([place]) 02/27/2017   PCP:  Patient, No Pcp Per Pharmacy:   [place] ([place]), [place] - [address] [place] ([place]) [place] [address] Phone: [phone] Fax: [phone]     Social Determinants of Health (SDOH)  Interventions    Readmission Risk Interventions No flowsheet data found.

## 2019-08-29 NOTE — Progress Notes (Signed)
PHARMACY - PHYSICIAN COMMUNICATION CRITICAL VALUE ALERT - BLOOD CULTURE IDENTIFICATION (BCID)  Patrick [name] is an 44 y.o. male who presented to [facility] on 08/28/2019 with a chief complaint of weakness, right hip pain.   Assessment:  BCID + 2/4 staphylococcus species - NO methicillin resistance detected.   Name of physician (or Provider) Contacted: Dr. Janee Morn  Current antibiotics: None  Changes to prescribed antibiotics: Cefazolin 2 g IV q8h ordered  Results for orders placed or performed during the hospital encounter of 08/28/19  Blood Culture ID Panel (Reflexed) (Collected: 08/28/2019  2:12 PM)  Result Value Ref Range   Enterococcus species NOT DETECTED NOT DETECTED   Listeria monocytogenes NOT DETECTED NOT DETECTED   Staphylococcus species DETECTED (A) NOT DETECTED   Staphylococcus aureus (BCID) DETECTED (A) NOT DETECTED   Methicillin resistance NOT DETECTED NOT DETECTED   Streptococcus species NOT DETECTED NOT DETECTED   Streptococcus agalactiae NOT DETECTED NOT DETECTED   Streptococcus pneumoniae NOT DETECTED NOT DETECTED   Streptococcus pyogenes NOT DETECTED NOT DETECTED   Acinetobacter baumannii NOT DETECTED NOT DETECTED   Enterobacteriaceae species NOT DETECTED NOT DETECTED   Enterobacter cloacae complex NOT DETECTED NOT DETECTED   Escherichia coli NOT DETECTED NOT DETECTED   Klebsiella oxytoca NOT DETECTED NOT DETECTED   Klebsiella pneumoniae NOT DETECTED NOT DETECTED   Proteus species NOT DETECTED NOT DETECTED   Serratia marcescens NOT DETECTED NOT DETECTED   Haemophilus influenzae NOT DETECTED NOT DETECTED   Neisseria meningitidis NOT DETECTED NOT DETECTED   Pseudomonas aeruginosa NOT DETECTED NOT DETECTED   Candida albicans NOT DETECTED NOT DETECTED   Candida glabrata NOT DETECTED NOT DETECTED   Candida krusei NOT DETECTED NOT DETECTED   Candida parapsilosis NOT DETECTED NOT DETECTED   Candida tropicalis NOT DETECTED NOT DETECTED    Cindi Carbon,  PharmD 08/29/2019  10:36 AM

## 2019-08-29 NOTE — Procedures (Signed)
Patient Name: Patrick [name]  MRN: [id number]  Epilepsy Attending: Charlsie Quest  Referring Physician/Provider: Dr. Ramiro Harvest Date: 08/29/2019 Duration: 24.14 min  Patient history: 44 year old male with altered mental status.  EEG evaluate for seizures.  Level of alertness: Awake,asleep  AEDs during EEG study: None  Technical aspects: This EEG study was done with scalp electrodes positioned according to the 10-20 International system of electrode placement. Electrical activity was acquired at a sampling rate of 500Hz  and reviewed with a high frequency filter of 70Hz  and a low frequency filter of 1Hz . EEG data were recorded continuously and digitally stored.  Description: The posterior dominant rhythm consists of 8-9 Hz activity of moderate voltage (25-35 uV) seen predominantly in posterior head regions, symmetric and reactive to eye opening and eye closing. Sleep was characterized by sleep spindles (12-14hz ), maximal frontocentral region. Hyperventilation and photic stimulation were not performed.     Of note, eeg was technically difficult due to significant myogenic and electrode artifact.  IMPRESSION: This technically difficult study is within normal limits. No seizures or epileptiform discharges were seen throughout the recording.   Ezrie Bunyan 

## 2019-08-29 NOTE — H&P (Signed)
History and Physical    Patrick MessingJeffrey Shadd UEA:[id number] DOB: 10/19/1975 DOA: 08/28/2019  PCP: Patient, No Pcp Per   Patient coming from: Home.  I have personally briefly reviewed patient's old medical records [facility]  Chief Complaint: Weakness, right hip pain, found by EMS lying on the ground outside of his home covered in feces  HPI: Patrick [name] is a 44 y.o. male with medical history significant of hypertension, eczema, migraine headaches, alcohol abuse, nicotine use.  Patient does have a history of heavy smoking, does drink heavily.  Patient drinks 6-10 beers and also 6 shots of hard liquor a day.  Today he was trying to stand up sit in a chair when he slipped and he fell and hurt his right hip.  Patient lay on the side for some time EMS was called and got him to the ER.  As per EMS the patient was covered with feces.  Patient states that his last drink of alcohol was today around noontime.  Did have nausea and vomitings.  The patient is a very poor historian.  Getting history from him is very difficult.  As per the ER notes there was mention of some bloody vomiting.  Patient was found to have some blood around his mouth and also did bite his tongue.  Not sure whether he had any seizure. Patient has abnormal blood work and PCCM was consulted and was evaluated by Dr Kendrick FriesMcQuaid who thought the patient could be admitted to the hospitalist service.  Patient was treated with IV fluids in the ER.  Patient complains of generalized body aches.  Patient lives at home by himself, uses a walker to move around.  Patient had a temperature of 101.2 Fahrenheit, tachycardic, lactic acid greater than 11, concern for aspiration pneumonia due to his vomitings, patient was treated with broad-spectrum antibiotics in the ER.  His LFTs were elevated.  Platelets were low at 17,000.  Patient was also found to have significant hypokalemia and hypomagnesemia and electrolytes are being replaced.  ED Course: Patient was  treated with IV fluids, potassium and magnesium were replaced in the ER, his lactic acid level has come down.  CT of the abdomen and pelvis with contrast done in the ER showed marked diffuse hepatic steatosis, bilateral nonobstructing nephrolithiasis, chronic superior endplate compression deformity of L2 with mild bony retropulsion.  Review of Systems: Ten point review of systems reviewed  in detail and negative except as mentioned above in the HPI.   Past Medical History:  Diagnosis Date  . Eczema   . Hypertension   . Migraine headache     History reviewed. No pertinent surgical history. was reviewed in detail and was non-contributory. Social History  reports that he has been smoking cigarettes. He has been smoking about 1.00 pack per day. He has never used smokeless tobacco. He reports current alcohol use. He reports current drug use. Drug: Marijuana. Patient drinks heavily upto 6-10 beers/day and sometimes 6 shots of liquor a day.  Allergies  Allergen Reactions  . Mushroom Extract Complex Diarrhea and Other (See Comments)    Itchy throat    History reviewed. No pertinent family history. was reviewed in detail and was non-contributory.    Prior to Admission medications   Medication Sig Start Date End Date Taking? Authorizing Provider  ibuprofen (ADVIL,MOTRIN) 200 MG tablet Take 200-800 mg by mouth every 6 (six) hours as needed for moderate pain.   Yes [provider]  lisinopril (ZESTRIL) 5 MG tablet Take  5 mg by mouth daily.   Yes [provider]  Multiple Vitamin (MULTIVITAMIN PO) Take 1 tablet by mouth daily as needed (nutrition).   Yes [provider]  acetaminophen (TYLENOL) 325 MG tablet Take 3 tablets (975 mg total) by mouth every 6 (six) hours as needed for mild pain. Patient not taking: Reported on 04/10/2018 08/27/17   Barnetta Chapel, PA-C  docusate sodium (COLACE) 100 MG capsule Take 1 capsule (100 mg total) by mouth 2 (two) times  daily. Patient not taking: Reported on 04/10/2018 08/27/17   Barnetta Chapel, PA-C  lisinopril (PRINIVIL,ZESTRIL) 5 MG tablet Take 1 tablet (5 mg total) by mouth daily. 04/10/18 05/10/18  Curatolo, Adam, DO  methocarbamol (ROBAXIN) 500 MG tablet Take 1 tablet (500 mg total) by mouth every 8 (eight) hours as needed for muscle spasms. Patient not taking: Reported on 04/10/2018 08/27/17   Barnetta Chapel, PA-C  Oxycodone HCl 10 MG TABS Take 1 tablet (10 mg total) by mouth every 4 (four) hours as needed. Patient not taking: Reported on 04/10/2018 08/27/17   Barnetta Chapel, PA-C    Physical Exam: Vitals:   08/28/19 2300 08/29/19 0000 08/29/19 0030 08/29/19 0100  BP: (!) 134/113 (!) 123/98 (!) 128/101 122/89  Pulse: 96 (!) 101 98   Resp: 15 15 14 19   Temp:      TempSrc:      SpO2: 95% 100% 100% 100%    Constitutional: Patient lying in the bed in no acute distress, he is calm and comfortable Vitals:   08/28/19 2300 08/29/19 0000 08/29/19 0030 08/29/19 0100  BP: (!) 134/113 (!) 123/98 (!) 128/101 122/89  Pulse: 96 (!) 101 98   Resp: 15 15 14 19   Temp:      TempSrc:      SpO2: 95% 100% 100% 100%   Eyes: PERRL, lids normal, No pallor, No icterus ENMT: Mucous membranes are dry. Patient has dried blood noted around his mouth. Has tongue bite and blood noted to left side of  Tongue. Neck: normal, supple, no masses, no thyromegaly Respiratory: clear to auscultation bilaterally, no wheezing, no crackles. Normal respiratory effort. No accessory muscle use. Has a bruise on right side of his chest/flank Cardiovascular: S1 S2 Heard, rate and rhythm regular, tachycardic. No extremity edema. 2+ pedal pulses. No carotid bruits.  Abdomen: Soft, no tenderness, mildly distended, no masses palpated. Bowel sounds positive.  Musculoskeletal: no clubbing / cyanosis. No joint deformity upper and lower extremities. Good ROM, no contractures. Normal muscle tone.  Skin: warm and dry. no rashes noted on limited skin  examination. Chronic venous stasis changes noted to the bilateral lower ext. Has some petechiae noted to his bilateral lower ext/chest wall area. Neurologic: He is drowsy but arousable. CN 2-12 grossly intact. Sensation intact, DTR normal. Strength 5/5 in all 4.  Psychiatric:  Anxious, co-operative   Labs on Admission: I have personally reviewed following labs and imaging studies  CBC: Recent Labs  Lab 08/28/19 1412  WBC 8.4  NEUTROABS 6.1  HGB 11.9*  HCT 33.4*  MCV 99.1  PLT 17*    Basic Metabolic Panel: Recent Labs  Lab 08/28/19 1412  NA 130*  K <2.0*  CL 85*  CO2 22  GLUCOSE 113*  BUN 14  CREATININE 1.14  CALCIUM 7.3*  MG 1.2*    GFR: CrCl cannot be calculated (Unknown ideal weight.).  Liver Function Tests: Recent Labs  Lab 08/28/19 1412  AST 239*  ALT 106*  ALKPHOS 94  BILITOT 5.3*  PROT 6.5  ALBUMIN 3.2*    Urine analysis:    Component Value Date/Time   COLORURINE AMBER (A) 08/28/2019 1626   APPEARANCEUR CLEAR 08/28/2019 1626   LABSPEC 1.012 08/28/2019 1626   PHURINE 6.0 08/28/2019 1626   GLUCOSEU NEGATIVE 08/28/2019 1626   HGBUR MODERATE (A) 08/28/2019 1626   BILIRUBINUR NEGATIVE 08/28/2019 1626   KETONESUR NEGATIVE 08/28/2019 1626   PROTEINUR 30 (A) 08/28/2019 1626   UROBILINOGEN 0.2 09/25/2010 2309   NITRITE NEGATIVE 08/28/2019 1626   LEUKOCYTESUR NEGATIVE 08/28/2019 1626    Radiological Exams on Admission: CT ABDOMEN PELVIS W CONTRAST  Result Date: 08/28/2019 CLINICAL DATA:  Abdominal pain, hematemesis EXAM: CT ABDOMEN AND PELVIS WITH CONTRAST TECHNIQUE: Multidetector CT imaging of the abdomen and pelvis was performed using the standard protocol following bolus administration of intravenous contrast. CONTRAST:  OMNIPAQUE IOHEXOL 300 MG/ML  SOLN COMPARISON:  08/26/2017 FINDINGS: Lower chest: No acute abnormality. Hepatobiliary: Marked diffuse hepatic steatosis. No focal hepatic lesion is identified. Gallbladder appears unremarkable.  No hyperdense gallstone. No biliary dilatation. Pancreas: Unremarkable. No pancreatic ductal dilatation or surrounding inflammatory changes. Spleen: Normal in size without focal abnormality. Adrenals/Urinary Tract: 6 mm upper pole right renal calculus. Punctate 2 mm calculus within the midpole of the left kidney. Kidneys enhance symmetrically. No renal lesion or hydronephrosis. Bilateral ureters are unremarkable. Urinary bladder is unremarkable. No adrenal nodule. Stomach/Bowel: Stomach is within normal limits. Appendix appears normal. No evidence of bowel wall thickening, distention, or inflammatory changes. Vascular/Lymphatic: Scattered atherosclerotic calcifications of the abdominal aorta. No aneurysm. No abdominopelvic lymphadenopathy. Reproductive: Prostate is unremarkable. Other: No abdominal wall hernia or abnormality. No abdominopelvic ascites. Musculoskeletal: Chronic moderate superior endplate compression deformity of L2 with mild bony retropulsion, unchanged from prior. No new or acute osseous findings. IMPRESSION: 1. No acute abdominopelvic findings. 2. Marked diffuse hepatic steatosis. 3. Bilateral nonobstructive nephrolithiasis. 4. Chronic superior endplate compression deformity of L2 with mild bony retropulsion. 5. Aortic atherosclerosis. (ICD10-I70.0). Electronically Signed   By: Duanne Guess D.O.   On: 08/28/2019 16:54   US Abdomen Limited  Result Date: 08/28/2019 CLINICAL DATA:  Right upper quadrant pain; septic; significant ETOH use. EXAM: ULTRASOUND ABDOMEN LIMITED RIGHT UPPER QUADRANT COMPARISON:  CT abdomen/pelvis 08/28/2019, abdominal ultrasound 04/17/2006. FINDINGS: Gallbladder: There is sludge within the gallbladder. No gallstones are visualized. No appreciable gallbladder wall thickening. No sonographic Eulah Pont sign is elicited by the scanning technologist. Common bile duct: Diameter: The common duct is nonvisualized due to obscuration by overlying bowel gas. Liver: No focal lesion  identified. Generalized increased hepatic parenchymal echogenicity. Portal vein is patent on color Doppler imaging with normal direction of blood flow towards the liver. IMPRESSION: Gallbladder sludge. No appreciable gallstones or gallbladder wall thickening. The common duct is non-visualized due to obscuration by overlying bowel gas. Hyperechogenicity of the hepatic parenchyma consistent with marked hepatic steatosis demonstrated on same day CT abdomen/pelvis. Electronically Signed   By: Jackey Loge DO   On: 08/28/2019 19:30   DG Chest Port 1 View  Result Date: 08/28/2019 CLINICAL DATA:  Sepsis weakness EXAM: PORTABLE CHEST 1 VIEW COMPARISON:  09/23/2018, CT 08/26/2017, radiograph 05/26/2016 FINDINGS: Low lung volumes. Mildly elevated right diaphragm. No consolidation or effusion. Heart size within normal limits allowing for low lung volume. No pneumothorax.nodular opacity in the left mid lung probably represents vessel on end. IMPRESSION: Low lung volumes. Nodular opacity in the left mid lung probably represents vascular artifact. Suggest short interval two-view chest radiograph follow-up. Electronically Signed   By: Jasmine Pang  M.D.   On: 08/28/2019 15:01    EKG: Independently reviewed. EKG shows sinus tachycardia at 121 bpm, prolonged QTc at 621 ms  Assessment/Plan Active Problems:   Alcohol withdrawal (HCC)   Acute alcohol abuse with risk of alcohol withdrawal, POA -Admit him to the telemetry unit -IV fluids with thiamine and folic acid and multivitamins -Alcohol withdrawal CIWA protocol  Sepsis, POA -Sepsis as evidenced by tachycardia with heart rate greater than 100, lactic acid greater than 10, with temperature greater than 101 -Right no no source of infection seen, suspect possible aspiration pneumonia -Broad-spectrum antibiotics with vancomycin and cefepime -Patient has been pancultured  Severe hypokalemia, POA -Potassium is being replaced IV, p.o. and also with IV  fluids -Recheck potassium levels again in the morning  Severe hypomagnesemia, POA -Magnesium is remain replaced IV  Possible alcoholic hepatitis, POA -LFTs are significantly elevated, continue to go up then can consider consulting GI -Check LFTs again in the a.m. after hydration with IV fluids  Severe thrombocytopenia, POA -Might be secondary to alcoholic liver disease.  Continue to monitor platelet counts.  Monitor for any signs of bleeding.  Possible seizure likely due to alcohol withdrawal, POA -Patient does have a tongue bite noted on examination -Patient does not remember having a seizure, he fell down and low was not sure what happened -We will check a CT of the head  Acute dehydration, POA Acute kidney injury, POA -Avoid nephrotoxic agents -Monitor input and output -IV fluid hydration  Possible GI bleeding, POA -As per the ER physician notes patient had some bloody vomiting, at the time of my examination patient denied any bloody vomiting.  We will continue to monitor his hemoglobin. He does have blood noted around his mouth and has a tongue bite.  If any further drop in his hemoglobin then will need to consult GI.  I will place him on IV Protonix.  Nicotine use -Will place the patient on nicotine patch   DVT prophylaxis: Contraindicated due to severe thrombocytopenia Code Status: Full code Family Communication: No family available at the bedside Disposition Plan: Might benefit from rehab Consults called: None  Admission status: Admit to inpatient, telemetry  Severity of Illness: The appropriate patient status for this patient is INPATIENT. Inpatient status is judged to be reasonable and necessary in order to provide the required intensity of service to ensure the patient's safety. The patient's presenting symptoms, physical exam findings, and initial radiographic and laboratory data in the context of their chronic comorbidities is felt to place them at high risk for  further clinical deterioration. Furthermore, it is not anticipated that the patient will be medically stable for discharge from the hospital within 2 midnights of admission. The following factors support the patient status of inpatient.   " The patient's presenting symptoms include weakness,fall,altered mental status. " The worrisome physical exam findings include blood around his mouth,tongue bite,brusing on his chest, abdominal pain. " The initial radiographic and laboratory data are worrisome because of severe hypokalemia,elevated LFTs, thrombocytopenia " The chronic co-morbidities include hypertension,alcohol use,nicotine use   * I certify that at the point of admission it is my clinical judgment that the patient will require inpatient hospital care spanning beyond 2 midnights from the point of admission due to high intensity of service, high risk for further deterioration and high frequency of surveillance required.Arelia Sneddon MD [facility]  How to contact the [facility] Attending or Consulting provider 7A - 7P or covering provider during after hours 7P -7A,  for this patient?   1. Check the care team in [facility] and look for a) attending/consulting TR[facility] provider listed and b) the [facility] team listed 2. Log into [email or web address] and use [place]'s universal password to access. If you do not have the password, please contact the hospital operator. 3. Locate the [facility] provider you are looking for under [facility] and page to a number that you can be directly reached. 4. If you still have difficulty reaching the provider, please page the [facility] (Director on Call) for the Hospitalists listed on amion for assistance.  08/29/2019, 1:22 AM

## 2019-08-29 NOTE — Progress Notes (Signed)
Pharmacy Antibiotic Note  Patrick [name] is a 44 y.o. male admitted on 08/28/2019 with sepsis.  Pharmacy has been consulted for cefazolin dosing.  Pt presenting with weakness, right hip pain, found lying on ground by EMS covered in feces. PMH significant for HTN, EtOH abuse. Blood culture + for MSSA, pharmacy consulted to dose cefazolin.   Today, 08/29/19 -WBC WNL -SCr 0.7, CrCl -Lactate 9.4 > 3.4 -Tmax 101.2 F -Wt ordered  Plan:  Cefazolin 2 g IV q8h  Follow renal function and culture data     Temp (24hrs), Avg:99.6 F (37.6 C), Min:98.2 F (36.8 C), Max:101.2 F (38.4 C)  Recent Labs  Lab 08/28/19 1412 08/28/19 1646 08/29/19 0430 08/29/19 1002 08/29/19 1033  WBC 8.4  --  5.7  --   --   CREATININE 1.14  --  0.86  --  0.73  LATICACIDVEN >11.0* 9.7*  --  3.4*  --     CrCl cannot be calculated (Unknown ideal weight.).    Allergies  Allergen Reactions  . Mushroom Extract Complex Diarrhea and Other (See Comments)    Itchy throat    Antimicrobials this admission: cefazolin 4/30 >>  Cefepime, metronidazole, vancomycin 4/29 x1 in ED  Dose adjustments this admission:  Microbiology results: 4/29 BCx: 3/4 GPC in clusters, MSSA 4/29 UCx: Sent   Thank you for allowing pharmacy to be a part of this patient's care.  Cindi Carbon, PharmD 08/29/2019 11:58 AM

## 2019-08-29 NOTE — Progress Notes (Signed)
Initial Nutrition Assessment  RD working remotely.   DOCUMENTATION CODES:   Not applicable  INTERVENTION:  - will order Boost Breeze BID, each supplement provides 250 kcal and 9 grams of protein. - will order 30 mL Prostat BID, each supplement provides 100 kcal and 15 grams of protein. - will order daily multivitamin with minerals.    NUTRITION DIAGNOSIS:   Increased nutrient needs related to acute illness as evidenced by estimated needs.  GOAL:   Patient will meet greater than or equal to 90% of their needs  MONITOR:   PO intake, Supplement acceptance, Labs, Weight trends  REASON FOR ASSESSMENT:   Malnutrition Screening Tool  ASSESSMENT:   44 y.o. male with medical history of HTN, eczema, migraine headaches, alcohol abuse/heavy drinking, nicotine use/heavy smoking. Patient drinks 6-10 beers and 6 shots of hard liquor a day. On the day of admission, he had slipped trying to stand from a chair and hurt his R hip. He reported N/V on the day of admission and that his last drink was around 1200 on the day of admission. He was covered in feces when he arrived to the hospital. He lives at home alone and uses a walker to ambulate.  Patient is out of the room and is currently at [facility] for EEG. Per flow sheet documentation, he consumed 100% of lunch today. Per chart review, the most recently documented weight was on 09/23/18 when he weighed 179 lb. MST screen comment indicates that patient reported losing 20-30 lb in the past 1 month.   Per notes: - alcohol abuse with risk of withdrawal - sepsis - severe hypokalemia--repletion ordered - possible alcoholic hepatitis - severe thrombocytopenia  - possible seizure, tongue bit and bleeding on admission - acute dehydration, AKI - possible GIB   Labs reviewed; CBG: 169 mg/dl, Na: 456 mmol/l, K: 2.2 mmol/l, Cl: 93 mmol/l, Ca: 7.1 mg/dl, LFTs elevated.  Medications reviewed; 4 mg IV Mg sulfate x1 run 4/29 and x1 dose 4/30, 40 mg IV  protonix BID, 10 mEq IV KCl x1 run 4/29 and x4 runs 4/30, 40 mEq Klor-Con x3 doses 4/30, 30 mmol IV KPhos x1 run 4/30. IVF; NS-40 mEq IV KCl @ 125 mmol/l.     NUTRITION - FOCUSED PHYSICAL EXAM:  unable to complete at this time.   Diet Order:   Diet Order            Diet regular Room service appropriate? Yes; Fluid consistency: Thin  Diet effective now              EDUCATION NEEDS:   No education needs have been identified at this time  Skin:  Skin Assessment: Reviewed RN Assessment  Last BM:  PTA/unknown  Height:   Ht Readings from Last 1 Encounters:  09/23/18 5\' 11"  (1.803 m)    Weight:   Wt Readings from Last 1 Encounters:  09/23/18 81.6 kg    Ideal Body Weight:  78.2 kg  BMI:  There is no height or weight on file to calculate BMI.  Estimated Nutritional Needs:   Kcal:  09/25/18 kcal  Protein:  105-120 grams  Fluid:  >/= 2.2 L/day     [phone], MS, RD, LDN, CNSC Inpatient Clinical Dietitian RD pager # available in AMION  After hours/weekend pager # available in [facility]

## 2019-08-30 LAB — CBC
HCT: 26.2 % — ABNORMAL LOW (ref 39.0–52.0)
Hemoglobin: 8.7 g/dL — ABNORMAL LOW (ref 13.0–17.0)
MCH: 35.5 pg — ABNORMAL HIGH (ref 26.0–34.0)
MCHC: 33.2 g/dL (ref 30.0–36.0)
MCV: 106.9 fL — ABNORMAL HIGH (ref 80.0–100.0)
Platelets: 20 10*3/uL — CL (ref 150–400)
RBC: 2.45 MIL/uL — ABNORMAL LOW (ref 4.22–5.81)
RDW: 13.5 % (ref 11.5–15.5)
WBC: 3.8 10*3/uL — ABNORMAL LOW (ref 4.0–10.5)
nRBC: 0.8 % — ABNORMAL HIGH (ref 0.0–0.2)

## 2019-08-30 LAB — COMPREHENSIVE METABOLIC PANEL
ALT: 73 U/L — ABNORMAL HIGH (ref 0–44)
AST: 217 U/L — ABNORMAL HIGH (ref 15–41)
Albumin: 2.5 g/dL — ABNORMAL LOW (ref 3.5–5.0)
Alkaline Phosphatase: 71 U/L (ref 38–126)
Anion gap: 8 (ref 5–15)
BUN: 13 mg/dL (ref 6–20)
CO2: 25 mmol/L (ref 22–32)
Calcium: 7.3 mg/dL — ABNORMAL LOW (ref 8.9–10.3)
Chloride: 100 mmol/L (ref 98–111)
Creatinine, Ser: 0.77 mg/dL (ref 0.61–1.24)
GFR calc Af Amer: >60 mL/min (ref >60)
GFR calc non Af Amer: >60 mL/min (ref >60)
Glucose, Bld: 104 mg/dL — ABNORMAL HIGH (ref 70–99)
Potassium: 2.8 mmol/L — ABNORMAL LOW (ref 3.5–5.1)
Sodium: 133 mmol/L — ABNORMAL LOW (ref 135–145)
Total Bilirubin: 4.7 mg/dL — ABNORMAL HIGH (ref 0.3–1.2)
Total Protein: 5.2 g/dL — ABNORMAL LOW (ref 6.5–8.1)

## 2019-08-30 LAB — LACTIC ACID, PLASMA: Lactic Acid, Venous: 2.3 mmol/L (ref 0.5–1.9)

## 2019-08-30 LAB — CK: Total CK: 692 U/L — ABNORMAL HIGH (ref 49–397)

## 2019-08-30 LAB — VITAMIN D 25 HYDROXY (VIT D DEFICIENCY, FRACTURES): Vit D, 25-Hydroxy: 7.88 ng/mL — ABNORMAL LOW (ref 30–100)

## 2019-08-30 LAB — GLUCOSE, CAPILLARY: Glucose-Capillary: 98 mg/dL (ref 70–99)

## 2019-08-30 LAB — HAPTOGLOBIN: Haptoglobin: 176 mg/dL (ref 23–355)

## 2019-08-30 LAB — PHOSPHORUS: Phosphorus: 1.2 mg/dL — ABNORMAL LOW (ref 2.5–4.6)

## 2019-08-30 LAB — MAGNESIUM: Magnesium: 1.8 mg/dL (ref 1.7–2.4)

## 2019-08-30 MED ORDER — POTASSIUM PHOSPHATES 15 MMOLE/5ML IV SOLN
30.0000 mmol | Freq: Once | INTRAVENOUS | Status: AC
  Administered 2019-08-30: 15:00:00 30 mmol via INTRAVENOUS
  Filled 2019-08-30: qty 10

## 2019-08-30 MED ORDER — MAGNESIUM SULFATE 4 GM/100ML IV SOLN
4.0000 g | Freq: Once | INTRAVENOUS | Status: AC
  Administered 2019-08-30: 11:00:00 4 g via INTRAVENOUS
  Filled 2019-08-30: qty 100

## 2019-08-30 MED ORDER — CALCIUM CARBONATE-VITAMIN D 500-200 MG-UNIT PO TABS
2.0000 | ORAL_TABLET | Freq: Three times a day (TID) | ORAL | Status: DC
  Administered 2019-09-01 – 2019-09-05 (×10): 2 via ORAL
  Filled 2019-08-30 (×11): qty 2

## 2019-08-30 MED ORDER — FOLIC ACID 5 MG/ML IJ SOLN
1.0000 mg | Freq: Every day | INTRAMUSCULAR | Status: DC
  Administered 2019-08-30 – 2019-08-31 (×2): 1 mg via INTRAVENOUS
  Filled 2019-08-30 (×3): qty 0.2

## 2019-08-30 MED ORDER — POTASSIUM CHLORIDE 10 MEQ/100ML IV SOLN
10.0000 meq | INTRAVENOUS | Status: AC
  Administered 2019-08-30 (×4): 10 meq via INTRAVENOUS
  Filled 2019-08-30 (×4): qty 100

## 2019-08-30 MED ORDER — POTASSIUM CHLORIDE CRYS ER 20 MEQ PO TBCR: 40.0000 meq | EXTENDED_RELEASE_TABLET | ORAL | Status: DC

## 2019-08-30 MED ORDER — THIAMINE HCL 100 MG/ML IJ SOLN
100.0000 mg | Freq: Every day | INTRAMUSCULAR | Status: DC
  Administered 2019-08-30 – 2019-08-31 (×2): 100 mg via INTRAVENOUS
  Filled 2019-08-30 (×2): qty 2

## 2019-08-30 MED ORDER — HYDROXYZINE HCL 50 MG/ML IM SOLN
25.0000 mg | Freq: Four times a day (QID) | INTRAMUSCULAR | Status: DC | PRN
  Filled 2019-08-30 (×2): qty 0.5

## 2019-08-30 NOTE — Progress Notes (Signed)
Patient had yellow MEWS due to pulse, MD is aware of patients condition and PRN medications were administered for BP and HR. Patient is stable and being monitored. Patient's vitals rechecked are no longer in MEWS status. Marcelle Overlie, RN

## 2019-08-30 NOTE — Progress Notes (Signed)
PROGRESS NOTE    Patrick [name]  ZOX:[id number] DOB: June 06, 1975 DOA: 08/28/2019 PCP: Patient, No Pcp Per    Chief Complaint  Patient presents with  . Alcohol Intoxication  . Fatigue  . Nausea  . Hematemesis    Brief Narrative: HPI per Dr. Sherolyn Buba is a 44 y.o. male with medical history significant of hypertension, eczema, migraine headaches, alcohol abuse, nicotine use.  Patient does have a history of heavy smoking, does drink heavily.  Patient drinks 6-10 beers and also 6 shots of hard liquor a day.  Today he was trying to stand up sit in a chair when he slipped and he fell and hurt his right hip.  Patient lay on the side for some time EMS was called and got him to the ER.  As per EMS the patient was covered with feces.  Patient states that his last drink of alcohol was today around noontime.  Did have nausea and vomitings.  The patient is a very poor historian.  Getting history from him is very difficult.  As per the ER notes there was mention of some bloody vomiting.  Patient was found to have some blood around his mouth and also did bite his tongue.  Not sure whether he had any seizure. Patient has abnormal blood work and PCCM was consulted and was evaluated by Dr Lake Bells who thought the patient could be admitted to the hospitalist service.  Patient was treated with IV fluids in the ER.  Patient complains of generalized body aches.  Patient lives at home by himself, uses a walker to move around.  Patient had a temperature of 101.2 Fahrenheit, tachycardic, lactic acid greater than 11, concern for aspiration pneumonia due to his vomitings, patient was treated with broad-spectrum antibiotics in the ER.  His LFTs were elevated.  Platelets were low at 17,000.  Patient was also found to have significant hypokalemia and hypomagnesemia and electrolytes are being replaced.  ED Course: Patient was treated with IV fluids, potassium and magnesium were replaced in the ER, his lactic acid level has  come down.  CT of the abdomen and pelvis with contrast done in the ER showed marked diffuse hepatic steatosis, bilateral nonobstructing nephrolithiasis, chronic superior endplate compression deformity of L2 with mild bony retropulsion.  Assessment & Plan:   Principal Problem:   Severe sepsis with acute organ dysfunction due to methicillin susceptible Staphylococcus aureus (MSSA) (HCC) Active Problems:   MSSA bacteremia   Alcohol withdrawal (HCC)   Hypokalemia   Sepsis with acute liver failure without hepatic coma or septic shock (HCC)   Transaminitis  1 sepsis secondary to MSSA bacteremia, POA Patient met criteria for sepsis on admission with tachycardia, lactic acidosis, fever of 101.  Blood cultures positive for MSSA.  Urinalysis nitrite negative, leukocytes negative.  Chest x-ray negative for any acute infiltrate.  2D echo with a EF of 55 to 60%, no wall motion abnormalities, grade 1 diastolic dysfunction.  No vegetations noted.  May need a TEE.  Continue IV Ancef.  ID following and appreciate input and recommendations.   2.Severehypokalemia/hypomagnesemia/hypocalcemia/hypophosphatemia Likely secondary to alcohol abuse.  Potassium at 2.8 this morning.  Phosphorus was at 1.2, magnesium repleted at 1.8.  Give K. Dur 10 mEq IV every 1 hour x4 runs.  Patient agitated and likely going through withdrawal and as such difficult to give oral medications per RN.  K-Phos 30 mmol IV x1.  Vitamin D 25 hydroxy significantly depressed at 7.88. PTH pending.  Placed on Os-Cal  with vitamin D 3 times daily.  Repeat BMET this afternoon.  Follow.  3.  Anemia of chronic disease/severe thrombocytopenia Likely secondary to bone marrow suppression from ongoing alcohol abuse and MSSA bacteremia with underlying liver disease and concern for hepatitis.  Anemia panel consistent with anemia of chronic disease. Check a haptoglobin.  Patient with no overt bleeding.  Platelet count at 20 from 11 from 17 on admission.  Last  platelet count noted was 124 on 04/10/2018.  Decreasing platelets likely a dilutional component as well.  Transfusion threshold hemoglobin < 7.  Transfuse for platelets < 10 or acute bleed.  Due to severe thrombocytopenia hematology consulted and are following.   4.  Transaminitis/positive hepatitis C antibody Likely secondary to alcohol abuse.  CT abdomen and pelvis with diffuse hepatic steatosis.  Acute hepatitis panel positive hep C viral antibody.  Check a hepatitis C viral load.  ID following.   5.  Alcohol abuse Patient with significant alcohol abuse.  Patient with signs of withdrawal early on this morning and last night as well as being aggressive towards the staff.  Patient on the Ativan withdrawal protocol.  Continue the Librium detox protocol, thiamine, multivitamin, folic acid.  Follow.   6.  Dehydration IV fluids.  7.??  Seizure like activity Concern likely secondary to alcohol abuse.  Patient noted on admission to have a tongue bite.  Head CT negative for any acute abnormalities.  EEG with no seizures or epileptiform discharges seen throughout the recording.  Patient with some signs of alcohol withdrawal and aggression.    8.??  GI bleed, POA Per admitting physician ED physician noted patient had some bloody vomitus however no bloody vomitus noted at time of admission.  Hemoglobin however likely more dilutional.  Patient with no overt bleeding.  Follow H&H.  Continue IV PPI.  Follow.    DVT prophylaxis: SCDs/patient with severe thrombocytopenia Code Status: Full Family Communication: No family at bedside. Disposition:   Status is: Inpatient    Dispo: The patient is from: Home              Anticipated d/c is to: Likely home with home health versus SNF              Anticipated d/c date is: To be determined.  Pending work-up of bacteremia.              Patient currently with MSSA bacteremia, currently started on IV antibiotics, ID and hematology following.  Patient with severe  thrombocytopenia.  Patient with severe alcohol abuse on the Ativan withdrawal protocol.         Consultants:   ID: Dr. Baxter Flattery 08/29/2019  Hematology/oncology Dr. Alvy Bimler 08/29/2019  Procedures:  2D echo 08/29/2019  CT head 08/29/2019  CT abdomen and pelvis 08/28/2019  Chest x-ray 08/28/2019  EEG 08/29/2019  Antimicrobials:   IV Ancef 08/29/2019   Subjective: Patient asleep and snoring.  Patient just received some Ativan.  Per RN patient noted to be agitated early on this morning trying to pee on staff.  Patient noted to be going through alcohol withdrawal overnight and this morning.  Patient noted to be going into alcohol withdrawal overnight and this morning.  Objective: Vitals:   08/29/19 1800 08/29/19 1958 08/30/19 0013 08/30/19 0348  BP:  (!) 111/93 112/78 (!) 129/103  Pulse:  (!) 102 (!) 101 98  Resp:  _0 Temp:  97.9 F (36.6 C) 99.2 F (37.3 C) 98.6 F (37 C)  TempSrc:  Oral Oral Oral  SpO2:  100% 97% 100%  Weight: 86.2 kg       Intake/Output Summary (Last 24 hours) at 08/30/2019 1215 Last data filed at 08/30/2019 0400 Gross per 24 hour  Intake 1298 ml  Output 2100 ml  Net -802 ml   Filed Weights   08/29/19 1800  Weight: 86.2 kg    Examination:  General exam: Asleep.  Dry mucous membranes. HEENT: Pupils equal round and reactive to light and accommodation.  Oropharynx with a purplish tongue with bite marks noted on the lateral tongue. Respiratory system: Lungs clear to auscultation bilaterally.  No wheezes, no crackles, no rhonchi.  Normal respiratory effort.   Cardiovascular system: RRR no murmurs rubs or gallops.  No JVD.  No lower extremity edema.  Gastrointestinal system: Abdomen is soft, nontender, nondistended, positive bowel sounds.  No rebound.  No guarding.  Central nervous system: Asleep.  Moving extremities spontaneously. Extremities: Symmetric 5 x 5 power. Skin: Multiple areas of bruising noted.   Psychiatry: Judgement and insight  appear normal. Mood & affect appropriate.     Data Reviewed: I have personally reviewed following labs and imaging studies  CBC: Recent Labs  Lab 08/28/19 1412 08/29/19 0430 08/30/19 0712  WBC 8.4 5.7 3.8*  NEUTROABS 6.1  --   --   HGB 11.9* 9.3* 8.7*  HCT 33.4* 26.6* 26.2*  MCV 99.1 101.1* 106.9*  PLT 17* 11* 20*    Basic Metabolic Panel: Recent Labs  Lab 08/28/19 1412 08/29/19 0430 08/29/19 1033 08/30/19 0712  NA 130* 129* 129* 133*  K <2.0* <2.0* 2.2* 2.8*  CL 85* 89* 93* 100  CO2 _0 GLUCOSE 113* 134* 167* 104*  BUN _1 CREATININE 1.14 0.86 0.73 0.77  CALCIUM 7.3* 7.0* 7.1* 7.3*  MG 1.2* 3.3*  --  1.8  PHOS  --  1.3*  --  1.2*    GFR: CrCl cannot be calculated (Unknown ideal weight.).  Liver Function Tests: Recent Labs  Lab 08/28/19 1412 08/29/19 0430 08/29/19 1033 08/30/19 0712  AST 239* 182* 190* 217*  ALT 106* 84* 85* 73*  ALKPHOS 94 70 69 71  BILITOT 5.3* 5.6* 6.3* 4.7*  PROT 6.5 5.5* 5.6* 5.2*  ALBUMIN 3.2* 2.6* 2.5* 2.5*    CBG: Recent Labs  Lab 08/29/19 1008 08/30/19 0800  GLUCAP 169* 98     Recent Results (from the past 240 hour(s))  Blood Culture (routine x 2)     Status: Abnormal (Preliminary result)   Collection Time: 08/28/19  2:12 PM   Specimen: BLOOD  Result Value Ref Range Status   Specimen Description   Final    BLOOD LEFT ANTECUBITAL Performed at [facility], [place] [address], [place], [place] [address]    Special Requests   Final    BOTTLES DRAWN AEROBIC AND ANAEROBIC Blood Culture adequate volume Performed at [facility] [address], [place], [place] [address]    Culture  Setup Time   Final    GRAM POSITIVE COCCI IN BOTH AEROBIC AND ANAEROBIC BOTTLES CRITICAL RESULT CALLED TO, READ BACK BY AND VERIFIED WITH: E. JACKSON, PHARMD (WL) AT 3474 ON 08/29/19 BY C. JESSUP, MT.    Culture (A)  Final    STAPHYLOCOCCUS AUREUS SUSCEPTIBILITIES TO FOLLOW Performed at [facility], [place] [address], [place],  [address]    Report Status PENDING  Incomplete  Blood Culture ID Panel (Reflexed)  Status: Abnormal   Collection Time: 08/28/19  2:12 PM  Result Value Ref Range Status   Enterococcus species NOT DETECTED NOT DETECTED Final   Listeria monocytogenes NOT DETECTED NOT DETECTED Final   Staphylococcus species DETECTED (A) NOT DETECTED Final    Comment: CRITICAL RESULT CALLED TO, READ BACK BY AND VERIFIED WITH: E. JACKSON, PHARMD (WL) AT 0923 ON 08/29/19 BY C. JESSUP, MT.    Staphylococcus aureus (BCID) DETECTED (A) NOT DETECTED Final    Comment: Methicillin (oxacillin) susceptible Staphylococcus aureus (MSSA). Preferred therapy is anti staphylococcal beta lactam antibiotic (Cefazolin or Nafcillin), unless clinically contraindicated. CRITICAL RESULT CALLED TO, READ BACK BY AND VERIFIED WITH: E. JACKSON, PHARMD (WL) AT 3007 ON 08/29/19 BY C. JESSUP, MT.    Methicillin resistance NOT DETECTED NOT DETECTED Final   Streptococcus species NOT DETECTED NOT DETECTED Final   Streptococcus agalactiae NOT DETECTED NOT DETECTED Final   Streptococcus pneumoniae NOT DETECTED NOT DETECTED Final   Streptococcus pyogenes NOT DETECTED NOT DETECTED Final   Acinetobacter baumannii NOT DETECTED NOT DETECTED Final   Enterobacteriaceae species NOT DETECTED NOT DETECTED Final   Enterobacter cloacae complex NOT DETECTED NOT DETECTED Final   Escherichia coli NOT DETECTED NOT DETECTED Final   Klebsiella oxytoca NOT DETECTED NOT DETECTED Final   Klebsiella pneumoniae NOT DETECTED NOT DETECTED Final   Proteus species NOT DETECTED NOT DETECTED Final   Serratia marcescens NOT DETECTED NOT DETECTED Final   Haemophilus influenzae NOT DETECTED NOT DETECTED Final   Neisseria meningitidis NOT DETECTED NOT DETECTED Final   Pseudomonas aeruginosa NOT DETECTED NOT DETECTED Final   Candida albicans NOT DETECTED NOT DETECTED Final   Candida glabrata NOT DETECTED NOT DETECTED Final     Candida krusei NOT DETECTED NOT DETECTED Final   Candida parapsilosis NOT DETECTED NOT DETECTED Final   Candida tropicalis NOT DETECTED NOT DETECTED Final    Comment: Performed at [facility], [address] [place], [place] [address]  Blood Culture (routine x 2)     Status: Abnormal (Preliminary result)   Collection Time: 08/28/19  2:27 PM   Specimen: BLOOD  Result Value Ref Range Status   Specimen Description   Final    BLOOD RIGHT ANTECUBITAL Performed at [facility] [address] [place], [place] [address]    Special Requests   Final    BOTTLES DRAWN AEROBIC AND ANAEROBIC Blood Culture adequate volume Performed at [facility] [address], [place], [place] [address]    Culture  Setup Time   Final    GRAM POSITIVE COCCI IN CLUSTERS ANAEROBIC BOTTLE ONLY CRITICAL VALUE NOTED.  VALUE IS CONSISTENT WITH PREVIOUSLY REPORTED AND CALLED VALUE. Performed at [facility], [place] [address] [place], [place] [address]    Culture STAPHYLOCOCCUS AUREUS (A)  Final   Report Status PENDING  Incomplete  Respiratory Panel by RT PCR (Flu A&B, Covid) - Nasopharyngeal Swab     Status: None   Collection Time: 08/28/19  3:30 PM   Specimen: Nasopharyngeal Swab  Result Value Ref Range Status   SARS Coronavirus 2 by RT PCR NEGATIVE NEGATIVE Final    Comment: (NOTE) SARS-CoV-2 target nucleic acids are NOT DETECTED. The SARS-CoV-2 RNA is generally detectable in upper respiratoy specimens during the acute phase of infection. The lowest concentration of SARS-CoV-2 viral copies this assay can detect is 131 copies/mL. A negative result does not preclude SARS-Cov-2 infection and should not be used as the sole basis for treatment or  other patient management decisions. A negative result may occur with  improper specimen collection/handling, submission of specimen other than nasopharyngeal swab, presence of viral mutation(s) within the areas  targeted by this assay, and inadequate number of viral copies (<131 copies/mL). A negative result must be combined with clinical observations, patient history, and epidemiological information. The expected result is Negative. Fact Sheet for Patients:  PinkCheek.be Fact Sheet for Healthcare Providers:  GravelBags.it This test is not yet ap proved or cleared by the [place] FDA and  has been authorized for detection and/or diagnosis of SARS-CoV-2 by FDA under an Emergency Use Authorization (EUA). This EUA will remain  in effect (meaning this test can be used) for the duration of the COVID-19 declaration under Section 564(b)(1) of the Act, 21 U.S.C. section 360bbb-3(b)(1), unless the authorization is terminated or revoked sooner.    Influenza A by PCR NEGATIVE NEGATIVE Final   Influenza B by PCR NEGATIVE NEGATIVE Final    Comment: (NOTE) The Xpert Xpress SARS-CoV-2/FLU/RSV assay is intended as an aid in  the diagnosis of influenza from Nasopharyngeal swab specimens and  should not be used as a sole basis for treatment. Nasal washings and  aspirates are unacceptable for Xpert Xpress SARS-CoV-2/FLU/RSV  testing. Fact Sheet for Patients: PinkCheek.be Fact Sheet for Healthcare Providers: GravelBags.it This test is not yet approved or cleared by the [place] FDA and  has been authorized for detection and/or diagnosis of SARS-CoV-2 by  FDA under an Emergency Use Authorization (EUA). This EUA will remain  in effect (meaning this test can be used) for the duration of the  Covid-19 declaration under Section 564(b)(1) of the Act, 21  U.S.C. section 360bbb-3(b)(1), unless the authorization is  terminated or revoked. Performed at [facility], [place] [address], [place], [place] [address]   Urine culture     Status: None   Collection Time: 08/28/19  4:26  PM   Specimen: In/Out Cath Urine  Result Value Ref Range Status   Specimen Description   Final    IN/OUT CATH URINE Performed at [facility] [address], [place], [place] [address]    Special Requests   Final    NONE Performed at [facility], [place] [address], [place], [place] [address]    Culture   Final    NO GROWTH Performed at [facility], [place] [address], [place],  [address]    Report Status 08/29/2019 FINAL  Final         Radiology Studies: CT HEAD WO CONTRAST  Result Date: 08/29/2019 CLINICAL DATA:  Fall with headache.  Thrombocytopenia EXAM: CT HEAD WITHOUT CONTRAST TECHNIQUE: Contiguous axial images were obtained from the base of the skull through the vertex without intravenous contrast. COMPARISON:  04/10/2018 FINDINGS: Brain: No evidence of swelling, infarction, hemorrhage, hydrocephalus, extra-axial collection or mass lesion/mass effect. Dilated perivascular spaces at/below the basal ganglia. Vascular: No hyperdense vessel or unexpected calcification. Skull: Normal. Negative for fracture or focal lesion. Sinuses/Orbits: No evidence of injury Other: Motion degradation requiring 2 acquisitions. IMPRESSION: No evidence of intracranial injury. Electronically Signed   By: Monte Fantasia M.D.   On: 08/29/2019 06:05   CT ABDOMEN PELVIS W CONTRAST  Result Date: 08/28/2019 CLINICAL DATA:  Abdominal pain, hematemesis EXAM: CT ABDOMEN AND PELVIS WITH CONTRAST TECHNIQUE: Multidetector CT imaging of the abdomen and pelvis was performed using the standard protocol following bolus administration of intravenous contrast. CONTRAST:  155m OMNIPAQUE IOHEXOL 300 MG/ML  SOLN  COMPARISON:  08/26/2017 FINDINGS: Lower chest: No acute abnormality. Hepatobiliary: Marked diffuse hepatic steatosis. No focal hepatic lesion is identified. Gallbladder appears unremarkable. No hyperdense gallstone. No biliary dilatation. Pancreas: Unremarkable. No  pancreatic ductal dilatation or surrounding inflammatory changes. Spleen: Normal in size without focal abnormality. Adrenals/Urinary Tract: 6 mm upper pole right renal calculus. Punctate 2 mm calculus within the midpole of the left kidney. Kidneys enhance symmetrically. No renal lesion or hydronephrosis. Bilateral ureters are unremarkable. Urinary bladder is unremarkable. No adrenal nodule. Stomach/Bowel: Stomach is within normal limits. Appendix appears normal. No evidence of bowel wall thickening, distention, or inflammatory changes. Vascular/Lymphatic: Scattered atherosclerotic calcifications of the abdominal aorta. No aneurysm. No abdominopelvic lymphadenopathy. Reproductive: Prostate is unremarkable. Other: No abdominal wall hernia or abnormality. No abdominopelvic ascites. Musculoskeletal: Chronic moderate superior endplate compression deformity of L2 with mild bony retropulsion, unchanged from prior. No new or acute osseous findings. IMPRESSION: 1. No acute abdominopelvic findings. 2. Marked diffuse hepatic steatosis. 3. Bilateral nonobstructive nephrolithiasis. 4. Chronic superior endplate compression deformity of L2 with mild bony retropulsion. 5. Aortic atherosclerosis. (ICD10-I70.0). Electronically Signed   By: Davina Poke D.O.   On: 08/28/2019 16:54   US Abdomen Limited  Result Date: 08/28/2019 CLINICAL DATA:  Right upper quadrant pain; septic; significant ETOH use. EXAM: ULTRASOUND ABDOMEN LIMITED RIGHT UPPER QUADRANT COMPARISON:  CT abdomen/pelvis 08/28/2019, abdominal ultrasound 04/17/2006. FINDINGS: Gallbladder: There is sludge within the gallbladder. No gallstones are visualized. No appreciable gallbladder wall thickening. No sonographic Percell Miller sign is elicited by the scanning technologist. Common bile duct: Diameter: The common duct is nonvisualized due to obscuration by overlying bowel gas. Liver: No focal lesion identified. Generalized increased hepatic parenchymal echogenicity. Portal  vein is patent on color Doppler imaging with normal direction of blood flow towards the liver. IMPRESSION: Gallbladder sludge. No appreciable gallstones or gallbladder wall thickening. The common duct is non-visualized due to obscuration by overlying bowel gas. Hyperechogenicity of the hepatic parenchyma consistent with marked hepatic steatosis demonstrated on same day CT abdomen/pelvis. Electronically Signed   By: Kellie Simmering DO   On: 08/28/2019 19:30   DG Chest Port 1 View  Result Date: 08/28/2019 CLINICAL DATA:  Sepsis weakness EXAM: PORTABLE CHEST 1 VIEW COMPARISON:  09/23/2018, CT 08/26/2017, radiograph 05/26/2016 FINDINGS: Low lung volumes. Mildly elevated right diaphragm. No consolidation or effusion. Heart size within normal limits allowing for low lung volume. No pneumothorax.nodular opacity in the left mid lung probably represents vessel on end. IMPRESSION: Low lung volumes. Nodular opacity in the left mid lung probably represents vascular artifact. Suggest short interval two-view chest radiograph follow-up. Electronically Signed   By: Donavan Foil M.D.   On: 08/28/2019 15:01   EEG adult  Result Date: 08/29/2019 Lora Havens, MD     08/29/2019  4:16 PM Patient Name: Aum Caggiano MRN: [id number] Epilepsy Attending: Lora Havens Referring Physician/Provider: Dr. Irine Seal Date: 08/29/2019 Duration: 24.14 min Patient history: 44 year old male with altered mental status.  EEG evaluate for seizures. Level of alertness: Awake,asleep AEDs during EEG study: None Technical aspects: This EEG study was done with scalp electrodes positioned according to the 10-20 International system of electrode placement. Electrical activity was acquired at a sampling rate of _0  and reviewed with a high frequency filter of _1  and a low frequency filter of _2 . EEG data were recorded continuously and digitally stored. Description: The posterior dominant rhythm consists of 8-9 Hz activity of moderate voltage  (25-35 uV) seen predominantly in posterior head regions, symmetric and reactive to eye  opening and eye closing. Sleep was characterized by sleep spindles (12-_0 ), maximal frontocentral region. Hyperventilation and photic stimulation were not performed.  Of note, eeg was technically difficult due to significant myogenic and electrode artifact. IMPRESSION: This technically difficult study is within normal limits. No seizures or epileptiform discharges were seen throughout the recording. Lora Havens   ECHOCARDIOGRAM COMPLETE  Result Date: 08/29/2019    ECHOCARDIOGRAM REPORT   Patient Name:   Harley Schoneman Date of Exam: 08/29/2019 Medical Rec #:  [id number]    Height:       71.0 in Accession #:    [id number]   Weight:       180.0 lb Date of Birth:  Jul 15, 1975     BSA:          2.016 m Patient Age:    31 years     BP:           142/102 mmHg Patient Gender: M            HR:           104 bpm. Exam Location:  Inpatient Procedure: 2D Echo, Cardiac Doppler and Color Doppler Indications:    CHF  History:        Patient has no prior history of Echocardiogram examinations.                 Risk Factors:Hypertension and Current Smoker. Alcohol abuse.  Sonographer:    Dustin Flock Referring Phys: [phone] Callaway Hardigree V Donnia P[place]  Sonographer Comments: Image acquisition challenging due to uncooperative patient. IMPRESSIONS  1. Left ventricular ejection fraction, by estimation, is 55 to 60%. The left ventricle has normal function. The left ventricle has no regional wall motion abnormalities. Left ventricular diastolic parameters are consistent with Grade I diastolic dysfunction (impaired relaxation).  2. Right ventricular systolic function is normal. The right ventricular size is normal.  3. Left atrial size was mildly dilated.  4. The mitral valve is normal in structure. No evidence of mitral valve regurgitation. No evidence of mitral stenosis.  5. The aortic valve is tricuspid. Aortic valve regurgitation is not visualized. Mild  aortic valve sclerosis is present, with no evidence of aortic valve stenosis.  6. The inferior vena cava is normal in size with greater than 50% respiratory variability, suggesting right atrial pressure of 3 mmHg. FINDINGS  Left Ventricle: Left ventricular ejection fraction, by estimation, is 55 to 60%. The left ventricle has normal function. The left ventricle has no regional wall motion abnormalities. The left ventricular internal cavity size was normal in size. There is  no left ventricular hypertrophy. Left ventricular diastolic parameters are consistent with Grade I diastolic dysfunction (impaired relaxation). Right Ventricle: The right ventricular size is normal. No increase in right ventricular wall thickness. Right ventricular systolic function is normal. Left Atrium: Left atrial size was mildly dilated. Right Atrium: Right atrial size was normal in size. Pericardium: There is no evidence of pericardial effusion. Mitral Valve: The mitral valve is normal in structure. Normal mobility of the mitral valve leaflets. No evidence of mitral valve regurgitation. No evidence of mitral valve stenosis. Tricuspid Valve: The tricuspid valve is normal in structure. Tricuspid valve regurgitation is not demonstrated. No evidence of tricuspid stenosis. Aortic Valve: The aortic valve is tricuspid. Aortic valve regurgitation is not visualized. Mild aortic valve sclerosis is present, with no evidence of aortic valve stenosis. Pulmonic Valve: The pulmonic valve was normal in structure. Pulmonic valve regurgitation is not visualized. No evidence of pulmonic stenosis.  Aorta: The aortic root is normal in size and structure. Venous: The inferior vena cava is normal in size with greater than 50% respiratory variability, suggesting right atrial pressure of 3 mmHg. IAS/Shunts: The interatrial septum was not well visualized.  LEFT VENTRICLE PLAX 2D LVIDd:         4.90 cm  Diastology LVIDs:         3.40 cm  LV e' lateral:   10.00 cm/s LV  PW:         1.20 cm  LV E/e' lateral: 7.0 LV IVS:        1.10 cm  LV e' medial:    6.85 cm/s LVOT diam:     2.30 cm  LV E/e' medial:  10.3 LV SV:         48 LV SV Index:   24 LVOT Area:     4.15 cm  RIGHT VENTRICLE RV Basal diam:  2.40 cm RV S prime:     6.31 cm/s TAPSE (M-mode): 3.3 cm LEFT ATRIUM             Index       RIGHT ATRIUM           Index LA diam:        3.80 cm 1.88 cm/m  RA Area:     12.00 cm LA Vol (A2C):   31.7 ml 15.72 ml/m RA Volume:   22.40 ml  11.11 ml/m LA Vol (A4C):   52.2 ml 25.89 ml/m LA Biplane Vol: 43.8 ml 21.72 ml/m  AORTIC VALVE LVOT Vmax:   71.80 cm/s LVOT Vmean:  42.900 cm/s LVOT VTI:    0.115 m  AORTA Ao Root diam: 2.80 cm MITRAL VALVE MV Area (PHT): 10.25 cm   SHUNTS MV Decel Time: 74 msec     Systemic VTI:  0.12 m MV E velocity: 70.30 cm/s  Systemic Diam: 2.30 cm MV A velocity: 88.70 cm/s MV E/A ratio:  0.79 Jenkins Rouge MD Electronically signed by Jenkins Rouge MD Signature Date/Time: 08/29/2019/1:12:18 PM    Final    [place] EKG SITE RITE  Result Date: 08/29/2019 If Site Rite image not attached, placement could not be confirmed due to current cardiac rhythm.  [place] EKG SITE RITE  Result Date: 08/29/2019 If Site Rite image not attached, placement could not be confirmed due to current cardiac rhythm.       Scheduled Meds: . chlordiazePOXIDE  25 mg Oral QID   Followed by  . [START ON 08/31/2019] chlordiazePOXIDE  25 mg Oral TID   Followed by  . [START ON 09/01/2019] chlordiazePOXIDE  25 mg Oral BH-qamhs   Followed by  . [START ON 09/02/2019] chlordiazePOXIDE  25 mg Oral Daily  . feeding supplement  1 Container Oral BID BM  . feeding supplement (PRO-STAT SUGAR FREE 64)  30 mL Oral BID  . lisinopril  5 mg Oral Daily  . multivitamin with minerals  1 tablet Oral Daily  . nicotine  21 mg Transdermal Daily  . pantoprazole (PROTONIX) IV  40 mg Intravenous Q12H  . thiamine  100 mg Oral Daily   Continuous Infusions: . 0.9 % NaCl with KCl 40 mEq / L 125 mL/hr (08/29/19  2133)  .  ceFAZolin (ANCEF) IV 2 g (08/30/19 0540)  . lactated ringers 75 mL/hr at 08/28/19 2009  . magnesium sulfate bolus IVPB 4 g (08/30/19 1107)  . potassium chloride 10 mEq (08/30/19 1156)  . potassium PHOSPHATE IVPB (in mmol)  LOS: 2 days    Time spent: 45 minutes    Irine Seal, MD [facility]   To contact the attending provider between 7A-7P or the covering provider during after hours 7P-7A, please log into the web site [email or web address] and access using universal [place] password for that web site. If you do not have the password, please call the hospital operator.  08/30/2019, 12:15 PM

## 2019-08-30 NOTE — Progress Notes (Signed)
CRITICAL VALUE ALERT  Critical Value:  Platelet 20  Date & Time Notied:  08/30/19; 8:15 am  Provider Notified: Ramiro Harvest  Orders Received/Actions taken: None

## 2019-08-30 NOTE — Progress Notes (Signed)
Pt is agitated and combative towards nursing staff. Pt attempted to hit both the RN and NT while doing pt care. Pt attempted to urinate on RN and NT. RN placed mittens on pt to prevent self harm and from pulling out IV. MD notified and orders for a 1 to 1 safety sitter were placed. RN administered prn Librium. Will continue to monitor closely.

## 2019-08-30 NOTE — Progress Notes (Signed)
Patrick [name]   DOB:Sep 14, 1975   VQ#:[id number]    ASSESSMENT & PLAN:  Thrombocytopenia Likely due to bone marrow suppression from alcohol intake, sepsis, and underlying liver disease including hepatitis Recommend platelet transfusion if he is actively bleeding; otherwise, he does not need platelet transfusion unless his platelet count is less than 10,000  Anemia of chronic illness No need transfusion support Observe closely for now The pattern is not consistent with hemolysis  Sepsis Met sepsis criteria on admission Started on broad-spectrum antibiotics and pancultured Blood cultures were positive for Staph aureus  This can certainly cause severe pancytopenia  Alcohol abuse Alcohol withdrawal CIWA protocol On thiamine, folic acid, and multivitamin  Hepatic steatosis, hep C antibody 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  The patient is totally asleep and snoring I spoke with staff nurse He has intermittent tongue bleeding on and off and was in severe, acute alcohol withdrawal throughout the night  Objective:  Vitals:   08/30/19 0013 08/30/19 0348  BP: 112/78 (!) 129/103  Pulse: (!) 101 98  Resp: 20 20  Temp: 99.2 F (37.3 C) 98.6 F (37 C)  SpO2: 97% 100%     Intake/Output Summary (Last 24 hours) at 08/30/2019 1243 Last data filed at 08/30/2019 0400 Gross per 24 hour  Intake 1298 ml  Output 2100 ml  Net -802 ml    GENERAL: sleeping, no active signs of bleeding   Labs:  Recent Labs    08/29/19 0430 08/29/19 1033 08/30/19 0712  NA 129* 129* 133*  K <2.0* 2.2* 2.8*  CL 89* 93* 100  CO2 _0 GLUCOSE 134* 167* 104*   BUN _1 CREATININE 0.86 0.73 0.77  CALCIUM 7.0* 7.1* 7.3*  GFRNONAA >60 >60 >60  GFRAA >60 >60 >60  PROT 5.5* 5.6* 5.2*  ALBUMIN 2.6* 2.5* 2.5*  AST 182* 190* 217*  ALT 84* 85* 73*  ALKPHOS 70 69 71  BILITOT 5.6* 6.3* 4.7*  BILIDIR  --  3.6*  --   IBILI  --  2.7*  --     Studies:  CT HEAD WO CONTRAST  Result Date: 08/29/2019 CLINICAL DATA:  Fall with headache.  Thrombocytopenia EXAM: CT HEAD WITHOUT CONTRAST TECHNIQUE: Contiguous axial images were obtained from the base of the skull through the vertex without intravenous contrast. COMPARISON:  04/10/2018 FINDINGS: Brain: No evidence of swelling, infarction, hemorrhage, hydrocephalus, extra-axial collection or mass lesion/mass effect. Dilated perivascular spaces at/below the basal ganglia. Vascular: No hyperdense vessel or unexpected calcification. Skull: Normal. Negative for fracture or focal lesion. Sinuses/Orbits: No evidence of injury Other: Motion degradation requiring 2 acquisitions. IMPRESSION: No evidence of intracranial injury. Electronically Signed   By: Monte Fantasia M.D.   On: 08/29/2019 06:05   CT ABDOMEN PELVIS W CONTRAST  Result Date: 08/28/2019 CLINICAL DATA:  Abdominal pain, hematemesis EXAM: CT ABDOMEN AND PELVIS WITH CONTRAST TECHNIQUE: Multidetector CT imaging of the abdomen and pelvis was performed using the standard protocol following bolus administration of intravenous contrast. CONTRAST:  147m OMNIPAQUE IOHEXOL 300 MG/ML  SOLN COMPARISON:  08/26/2017 FINDINGS: Lower chest: No acute abnormality. Hepatobiliary: Marked diffuse hepatic steatosis.  No focal hepatic lesion is identified. Gallbladder appears unremarkable. No hyperdense gallstone. No biliary dilatation. Pancreas: Unremarkable. No pancreatic ductal dilatation or surrounding inflammatory changes. Spleen: Normal in size without focal abnormality. Adrenals/Urinary Tract: 6 mm upper pole right renal calculus. Punctate 2 mm calculus within the midpole of the  left kidney. Kidneys enhance symmetrically. No renal lesion or hydronephrosis. Bilateral ureters are unremarkable. Urinary bladder is unremarkable. No adrenal nodule. Stomach/Bowel: Stomach is within normal limits. Appendix appears normal. No evidence of bowel wall thickening, distention, or inflammatory changes. Vascular/Lymphatic: Scattered atherosclerotic calcifications of the abdominal aorta. No aneurysm. No abdominopelvic lymphadenopathy. Reproductive: Prostate is unremarkable. Other: No abdominal wall hernia or abnormality. No abdominopelvic ascites. Musculoskeletal: Chronic moderate superior endplate compression deformity of L2 with mild bony retropulsion, unchanged from prior. No new or acute osseous findings. IMPRESSION: 1. No acute abdominopelvic findings. 2. Marked diffuse hepatic steatosis. 3. Bilateral nonobstructive nephrolithiasis. 4. Chronic superior endplate compression deformity of L2 with mild bony retropulsion. 5. Aortic atherosclerosis. (ICD10-I70.0). Electronically Signed   By: Nicholas  Plundo D.O.   On: 08/28/2019 16:54   US Abdomen Limited  Result Date: 08/28/2019 CLINICAL DATA:  Right upper quadrant pain; septic; significant ETOH use. EXAM: ULTRASOUND ABDOMEN LIMITED RIGHT UPPER QUADRANT COMPARISON:  CT abdomen/pelvis 08/28/2019, abdominal ultrasound 04/17/2006. FINDINGS: Gallbladder: There is sludge within the gallbladder. No gallstones are visualized. No appreciable gallbladder wall thickening. No sonographic Murphy sign is elicited by the scanning technologist. Common bile duct: Diameter: The common duct is nonvisualized due to obscuration by overlying bowel gas. Liver: No focal lesion identified. Generalized increased hepatic parenchymal echogenicity. Portal vein is patent on color Doppler imaging with normal direction of blood flow towards the liver. IMPRESSION: Gallbladder sludge. No appreciable gallstones or gallbladder wall thickening. The common duct is non-visualized due to  obscuration by overlying bowel gas. Hyperechogenicity of the hepatic parenchyma consistent with marked hepatic steatosis demonstrated on same day CT abdomen/pelvis. Electronically Signed   By: Kyle  Golden DO   On: 08/28/2019 19:30   DG Chest Port 1 View  Result Date: 08/28/2019 CLINICAL DATA:  Sepsis weakness EXAM: PORTABLE CHEST 1 VIEW COMPARISON:  09/23/2018, CT 08/26/2017, radiograph 05/26/2016 FINDINGS: Low lung volumes. Mildly elevated right diaphragm. No consolidation or effusion. Heart size within normal limits allowing for low lung volume. No pneumothorax.nodular opacity in the left mid lung probably represents vessel on end. IMPRESSION: Low lung volumes. Nodular opacity in the left mid lung probably represents vascular artifact. Suggest short interval two-view chest radiograph follow-up. Electronically Signed   By: Kim  Fujinaga M.D.   On: 08/28/2019 15:01   EEG adult  Result Date: 08/29/2019 Yadav, Priyanka O, MD     08/29/2019  4:16 PM Patient Name: Waylen Meggett MRN: [id number] Epilepsy Attending: Priyanka O Yadav Referring Physician/Provider: Dr. Daniel Thompson Date: 08/29/2019 Duration: 24.14 min Patient history: 40-year-old male with altered mental status.  EEG evaluate for seizures. Level of alertness: Awake,asleep AEDs during EEG study: None Technical aspects: This EEG study was done with scalp electrodes positioned according to the 10-20 International system of electrode placement. Electrical activity was acquired at a sampling rate of 500Hz and reviewed with a high frequency filter of 70Hz and a low frequency filter of 1Hz. EEG data were recorded continuously and digitally stored. Description: The posterior dominant rhythm consists of 8-9 Hz activity of moderate voltage (25-35 uV) seen predominantly in posterior head regions, symmetric and reactive to eye opening and eye closing. Sleep was characterized by sleep spindles (12-14hz), maximal frontocentral region. Hyperventilation   and photic  stimulation were not performed.  Of note, eeg was technically difficult due to significant myogenic and electrode artifact. IMPRESSION: This technically difficult study is within normal limits. No seizures or epileptiform discharges were seen throughout the recording. Priyanka O Yadav   ECHOCARDIOGRAM COMPLETE  Result Date: 08/29/2019    ECHOCARDIOGRAM REPORT   Patient Name:   Rodgers Ruegg Date of Exam: 08/29/2019 Medical Rec #:  [id number]    Height:       71.0 in Accession #:    [id number]   Weight:       180.0 lb Date of Birth:  10/22/1975     BSA:          2.016 m Patient Age:    43 years     BP:           142/102 mmHg Patient Gender: M            HR:           104 bpm. Exam Location:  Inpatient Procedure: 2D Echo, Cardiac Doppler and Color Doppler Indications:    CHF  History:        Patient has no prior history of Echocardiogram examinations.                 Risk Factors:Hypertension and Current Smoker. Alcohol abuse.  Sonographer:    Brooke Strickland Referring Phys: [id number] DANIEL V THOMPSON  Sonographer Comments: Image acquisition challenging due to uncooperative patient. IMPRESSIONS  1. Left ventricular ejection fraction, by estimation, is 55 to 60%. The left ventricle has normal function. The left ventricle has no regional wall motion abnormalities. Left ventricular diastolic parameters are consistent with Grade I diastolic dysfunction (impaired relaxation).  2. Right ventricular systolic function is normal. The right ventricular size is normal.  3. Left atrial size was mildly dilated.  4. The mitral valve is normal in structure. No evidence of mitral valve regurgitation. No evidence of mitral stenosis.  5. The aortic valve is tricuspid. Aortic valve regurgitation is not visualized. Mild aortic valve sclerosis is present, with no evidence of aortic valve stenosis.  6. The inferior vena cava is normal in size with greater than 50% respiratory variability, suggesting right atrial pressure of 3 mmHg. FINDINGS   Left Ventricle: Left ventricular ejection fraction, by estimation, is 55 to 60%. The left ventricle has normal function. The left ventricle has no regional wall motion abnormalities. The left ventricular internal cavity size was normal in size. There is  no left ventricular hypertrophy. Left ventricular diastolic parameters are consistent with Grade I diastolic dysfunction (impaired relaxation). Right Ventricle: The right ventricular size is normal. No increase in right ventricular wall thickness. Right ventricular systolic function is normal. Left Atrium: Left atrial size was mildly dilated. Right Atrium: Right atrial size was normal in size. Pericardium: There is no evidence of pericardial effusion. Mitral Valve: The mitral valve is normal in structure. Normal mobility of the mitral valve leaflets. No evidence of mitral valve regurgitation. No evidence of mitral valve stenosis. Tricuspid Valve: The tricuspid valve is normal in structure. Tricuspid valve regurgitation is not demonstrated. No evidence of tricuspid stenosis. Aortic Valve: The aortic valve is tricuspid. Aortic valve regurgitation is not visualized. Mild aortic valve sclerosis is present, with no evidence of aortic valve stenosis. Pulmonic Valve: The pulmonic valve was normal in structure. Pulmonic valve regurgitation is not visualized. No evidence of pulmonic stenosis. Aorta: The aortic root is normal in size and structure. Venous: The inferior vena   cava is normal in size with greater than 50% respiratory variability, suggesting right atrial pressure of 3 mmHg. IAS/Shunts: The interatrial septum was not well visualized.  LEFT VENTRICLE PLAX 2D LVIDd:         4.90 cm  Diastology LVIDs:         3.40 cm  LV e' lateral:   10.00 cm/s LV PW:         1.20 cm  LV E/e' lateral: 7.0 LV IVS:        1.10 cm  LV e' medial:    6.85 cm/s LVOT diam:     2.30 cm  LV E/e' medial:  10.3 LV SV:         48 LV SV Index:   24 LVOT Area:     4.15 cm  RIGHT VENTRICLE RV  Basal diam:  2.40 cm RV S prime:     6.31 cm/s TAPSE (M-mode): 3.3 cm LEFT ATRIUM             Index       RIGHT ATRIUM           Index LA diam:        3.80 cm 1.88 cm/m  RA Area:     12.00 cm LA Vol (A2C):   31.7 ml 15.72 ml/m RA Volume:   22.40 ml  11.11 ml/m LA Vol (A4C):   52.2 ml 25.89 ml/m LA Biplane Vol: 43.8 ml 21.72 ml/m  AORTIC VALVE LVOT Vmax:   71.80 cm/s LVOT Vmean:  42.900 cm/s LVOT VTI:    0.115 m  AORTA Ao Root diam: 2.80 cm MITRAL VALVE MV Area (PHT): 10.25 cm   SHUNTS MV Decel Time: 74 msec     Systemic VTI:  0.12 m MV E velocity: 70.30 cm/s  Systemic Diam: 2.30 cm MV A velocity: 88.70 cm/s MV E/A ratio:  0.79 Peter Nishan MD Electronically signed by Peter Nishan MD Signature Date/Time: 08/29/2019/1:12:18 PM    Final    US EKG SITE RITE  Result Date: 08/29/2019 If Site Rite image not attached, placement could not be confirmed due to current cardiac rhythm.  US EKG SITE RITE  Result Date: 08/29/2019 If Site Rite image not attached, placement could not be confirmed due to current cardiac rhythm.   

## 2019-08-30 NOTE — Progress Notes (Signed)
RN called to room by sitter and pt was attempting to get out of bed. Pt became very combative and verbally aggressive towards RN and NT. Pt verbally threatened RN and NT stating "I'm going to kill you if you do not shut up." Pt was not easily redirected but laid back in bed and continued to verbally assault RN. RN notified Administrator, Civil Service. RN administered 4 mg Ativan IV. Pt is resting at this time. Will continue to monitor closely.

## 2019-08-31 ENCOUNTER — Inpatient Hospital Stay (HOSPITAL_COMMUNITY): Payer: Self-pay

## 2019-08-31 LAB — BASIC METABOLIC PANEL
Anion gap: 10 (ref 5–15)
BUN: 7 mg/dL (ref 6–20)
CO2: 23 mmol/L (ref 22–32)
Calcium: 7.8 mg/dL — ABNORMAL LOW (ref 8.9–10.3)
Chloride: 104 mmol/L (ref 98–111)
Creatinine, Ser: 0.66 mg/dL (ref 0.61–1.24)
GFR calc Af Amer: >60 mL/min (ref >60)
GFR calc non Af Amer: >60 mL/min (ref >60)
Glucose, Bld: 110 mg/dL — ABNORMAL HIGH (ref 70–99)
Potassium: 3.7 mmol/L (ref 3.5–5.1)
Sodium: 137 mmol/L (ref 135–145)

## 2019-08-31 LAB — MAGNESIUM
Magnesium: 1.6 mg/dL — ABNORMAL LOW (ref 1.7–2.4)
Magnesium: 1.7 mg/dL (ref 1.7–2.4)

## 2019-08-31 LAB — PHOSPHORUS: Phosphorus: 1.7 mg/dL — ABNORMAL LOW (ref 2.5–4.6)

## 2019-08-31 LAB — CBC
HCT: 23.5 % — ABNORMAL LOW (ref 39.0–52.0)
Hemoglobin: 7.8 g/dL — ABNORMAL LOW (ref 13.0–17.0)
MCH: 35 pg — ABNORMAL HIGH (ref 26.0–34.0)
MCHC: 33.2 g/dL (ref 30.0–36.0)
MCV: 105.4 fL — ABNORMAL HIGH (ref 80.0–100.0)
Platelets: 34 10*3/uL — ABNORMAL LOW (ref 150–400)
RBC: 2.23 MIL/uL — ABNORMAL LOW (ref 4.22–5.81)
RDW: 13.6 % (ref 11.5–15.5)
WBC: 4 10*3/uL (ref 4.0–10.5)
nRBC: 0.7 % — ABNORMAL HIGH (ref 0.0–0.2)

## 2019-08-31 LAB — HEMOGLOBIN AND HEMATOCRIT, BLOOD
HCT: 25.4 % — ABNORMAL LOW (ref 39.0–52.0)
HCT: 28.6 % — ABNORMAL LOW (ref 39.0–52.0)
Hemoglobin: 8.3 g/dL — ABNORMAL LOW (ref 13.0–17.0)
Hemoglobin: 9.4 g/dL — ABNORMAL LOW (ref 13.0–17.0)

## 2019-08-31 LAB — COMPREHENSIVE METABOLIC PANEL
ALT: 56 U/L — ABNORMAL HIGH (ref 0–44)
AST: 182 U/L — ABNORMAL HIGH (ref 15–41)
Albumin: 2.2 g/dL — ABNORMAL LOW (ref 3.5–5.0)
Alkaline Phosphatase: 72 U/L (ref 38–126)
Anion gap: 6 (ref 5–15)
BUN: 9 mg/dL (ref 6–20)
CO2: 23 mmol/L (ref 22–32)
Calcium: 7.3 mg/dL — ABNORMAL LOW (ref 8.9–10.3)
Chloride: 108 mmol/L (ref 98–111)
Creatinine, Ser: 0.69 mg/dL (ref 0.61–1.24)
GFR calc Af Amer: >60 mL/min (ref >60)
GFR calc non Af Amer: >60 mL/min (ref >60)
Glucose, Bld: 103 mg/dL — ABNORMAL HIGH (ref 70–99)
Potassium: 3.1 mmol/L — ABNORMAL LOW (ref 3.5–5.1)
Sodium: 137 mmol/L (ref 135–145)
Total Bilirubin: 3.4 mg/dL — ABNORMAL HIGH (ref 0.3–1.2)
Total Protein: 4.9 g/dL — ABNORMAL LOW (ref 6.5–8.1)

## 2019-08-31 LAB — PARATHYROID HORMONE, INTACT (NO CA): PTH: 103 pg/mL — ABNORMAL HIGH (ref 15–65)

## 2019-08-31 LAB — BLOOD GAS, ARTERIAL
Acid-Base Excess: 2 mmol/L (ref 0.0–2.0)
Bicarbonate: 24.7 mmol/L (ref 20.0–28.0)
O2 Content: 2 L/min
O2 Saturation: 97.8 %
Patient temperature: 99.1
pCO2 arterial: 31.9 mmHg — ABNORMAL LOW (ref 32.0–48.0)
pH, Arterial: 7.501 — ABNORMAL HIGH (ref 7.350–7.450)
pO2, Arterial: 108 mmHg (ref 83.0–108.0)

## 2019-08-31 LAB — GLUCOSE, CAPILLARY: Glucose-Capillary: 118 mg/dL — ABNORMAL HIGH (ref 70–99)

## 2019-08-31 LAB — BRAIN NATRIURETIC PEPTIDE: B Natriuretic Peptide: 459.7 pg/mL — ABNORMAL HIGH (ref 0.0–100.0)

## 2019-08-31 LAB — CULTURE, BLOOD (ROUTINE X 2)
Special Requests: ADEQUATE
Special Requests: ADEQUATE

## 2019-08-31 LAB — OCCULT BLOOD X 1 CARD TO LAB, STOOL: Fecal Occult Bld: POSITIVE — AB

## 2019-08-31 LAB — MRSA PCR SCREENING: MRSA by PCR: NEGATIVE

## 2019-08-31 MED ORDER — PANTOPRAZOLE SODIUM 40 MG IV SOLR: 40.0000 mg | Freq: Two times a day (BID) | INTRAVENOUS | Status: DC

## 2019-08-31 MED ORDER — LORAZEPAM 1 MG PO TABS: 1.0000 mg | ORAL_TABLET | ORAL | Status: DC | PRN

## 2019-08-31 MED ORDER — ORAL CARE MOUTH RINSE
15.0000 mL | Freq: Two times a day (BID) | OROMUCOSAL | Status: DC
  Administered 2019-08-31 – 2019-09-04 (×8): 15 mL via OROMUCOSAL

## 2019-08-31 MED ORDER — LORAZEPAM 2 MG/ML IJ SOLN: 0.0000 mg | Freq: Two times a day (BID) | INTRAMUSCULAR | Status: DC

## 2019-08-31 MED ORDER — FUROSEMIDE 10 MG/ML IJ SOLN
40.0000 mg | Freq: Once | INTRAMUSCULAR | Status: AC
  Administered 2019-08-31: 18:00:00 40 mg via INTRAVENOUS

## 2019-08-31 MED ORDER — THIAMINE HCL 100 MG/ML IJ SOLN
500.0000 mg | Freq: Every day | INTRAVENOUS | Status: DC
  Administered 2019-09-01: 500 mg via INTRAVENOUS
  Filled 2019-08-31 (×3): qty 5

## 2019-08-31 MED ORDER — FUROSEMIDE 10 MG/ML IJ SOLN
40.0000 mg | Freq: Two times a day (BID) | INTRAMUSCULAR | Status: DC
  Filled 2019-08-31: qty 4

## 2019-08-31 MED ORDER — DEXMEDETOMIDINE HCL IN NACL 200 MCG/50ML IV SOLN
0.2000 ug/kg/h | INTRAVENOUS | Status: DC
  Administered 2019-08-31: 21:00:00 0.2 ug/kg/h via INTRAVENOUS
  Administered 2019-09-01: 0.1 ug/kg/h via INTRAVENOUS
  Filled 2019-08-31 (×2): qty 50

## 2019-08-31 MED ORDER — THIAMINE HCL 100 MG/ML IJ SOLN: 100.0000 mg | Freq: Every day | INTRAMUSCULAR | Status: DC

## 2019-08-31 MED ORDER — LORAZEPAM 2 MG/ML IJ SOLN
0.0000 mg | Freq: Four times a day (QID) | INTRAMUSCULAR | Status: DC
  Administered 2019-08-31: 14:00:00 2 mg via INTRAVENOUS
  Filled 2019-08-31: qty 2

## 2019-08-31 MED ORDER — CHLORHEXIDINE GLUCONATE CLOTH 2 % EX PADS
6.0000 | MEDICATED_PAD | Freq: Every day | CUTANEOUS | Status: DC
  Administered 2019-08-31 – 2019-09-01 (×2): 6 via TOPICAL

## 2019-08-31 MED ORDER — ADULT MULTIVITAMIN W/MINERALS CH
1.0000 | ORAL_TABLET | Freq: Every day | ORAL | Status: DC
Start: 2019-08-31 — End: 2019-08-31

## 2019-08-31 MED ORDER — PANTOPRAZOLE SODIUM 40 MG IV SOLR
40.0000 mg | Freq: Two times a day (BID) | INTRAVENOUS | Status: DC
  Administered 2019-08-31 – 2019-09-03 (×6): 40 mg via INTRAVENOUS
  Filled 2019-08-31 (×6): qty 40

## 2019-08-31 MED ORDER — FOLIC ACID 1 MG PO TABS
1.0000 mg | ORAL_TABLET | Freq: Every day | ORAL | Status: DC
  Administered 2019-09-01 – 2019-09-05 (×4): 1 mg via ORAL
  Filled 2019-08-31 (×4): qty 1

## 2019-08-31 MED ORDER — SODIUM CHLORIDE 0.9 % IV SOLN
80.0000 mg | Freq: Once | INTRAVENOUS | Status: AC
  Administered 2019-08-31: 80 mg via INTRAVENOUS
  Filled 2019-08-31: qty 80

## 2019-08-31 MED ORDER — THIAMINE HCL 100 MG PO TABS: 100.0000 mg | ORAL_TABLET | Freq: Every day | ORAL | Status: DC

## 2019-08-31 MED ORDER — POTASSIUM PHOSPHATES 15 MMOLE/5ML IV SOLN
30.0000 mmol | Freq: Once | INTRAVENOUS | Status: AC
  Administered 2019-08-31: 12:00:00 30 mmol via INTRAVENOUS
  Filled 2019-08-31: qty 10

## 2019-08-31 MED ORDER — LORAZEPAM 2 MG/ML IJ SOLN
1.0000 mg | INTRAMUSCULAR | Status: DC | PRN
  Administered 2019-08-31 (×2): 4 mg via INTRAVENOUS
  Filled 2019-08-31: qty 1
  Filled 2019-08-31: qty 2

## 2019-08-31 MED ORDER — POTASSIUM CHLORIDE 10 MEQ/100ML IV SOLN
10.0000 meq | INTRAVENOUS | Status: AC
  Administered 2019-08-31 (×4): 10 meq via INTRAVENOUS
  Filled 2019-08-31 (×5): qty 100

## 2019-08-31 MED ORDER — SODIUM CHLORIDE 0.9 % IV SOLN
8.0000 mg/h | INTRAVENOUS | Status: DC
  Administered 2019-08-31: 8 mg/h via INTRAVENOUS
  Filled 2019-08-31: qty 80

## 2019-08-31 MED ORDER — POTASSIUM CHLORIDE 20 MEQ PO PACK
40.0000 meq | PACK | ORAL | Status: AC
  Administered 2019-08-31: 40 meq via ORAL
  Filled 2019-08-31: qty 2

## 2019-08-31 MED ORDER — MAGNESIUM SULFATE 4 GM/100ML IV SOLN
4.0000 g | Freq: Once | INTRAVENOUS | Status: AC
  Administered 2019-08-31: 12:00:00 4 g via INTRAVENOUS
  Filled 2019-08-31: qty 100

## 2019-08-31 NOTE — Progress Notes (Signed)
Pt is in the Yellow MEWS d/t BP: 141/103; HR: 105; RR: 21; LOC: Responds to Voice. RN notified Consulting civil engineer and MD is aware. Pt is having black tarry stools. MD placed orders for scheduled IV Ativan, Clear Liquid Diet, Protonix IV Drip, and a consult for Gastroenterologist. Pt is resting. Will continue to monitor closely.

## 2019-08-31 NOTE — Evaluation (Signed)
Clinical/Bedside Swallow Evaluation Patient Details  Name: Patrick [name] MRN: [id number] Date of Birth: 11/08/1975  Today's Date: 08/31/2019 Time: SLP Start Time (ACUTE ONLY): 2952 SLP Stop Time (ACUTE ONLY): 1606 SLP Time Calculation (min) (ACUTE ONLY): 18 min  Past Medical History:  Past Medical History:  Diagnosis Date  . Eczema   . Hypertension   . Migraine headache    Past Surgical History: History reviewed. No pertinent surgical history. HPI:  44 year old male with a past medical history significant for heavy alcohol abuse came to the [facility] long emergency room complaining of hip pain, nausea after a fall recently. Bloody emesis and bit his tongue prior to admission. Found to have sepsis secondary to MSSA bacteremia, severe hypokalemia/hypomagnesemia, sever thrombocypenia and question of GI bleed. Head CT negative. CXR Low lung volumes. Nodular opacity in the left mid lung probably. Significant coughing on juice with RN this morning.    Assessment / Plan / Recommendation Clinical Impression  Presently pt is at high aspiration risk and following assessment recommend NPO except several ice chips after oral care if pt alert and respirations stable. He is receivng scheduled Ativan with CIWA protocol. RN taking vitals when therapist arrived and respirations 29. Pt's eyes were open, dried blood on lips and severely dysathric and tachypneic. Respiratory status is concerning cue to snoring quality during respirations exhalations supporting only 1-2 words per breath. Pt given 2 bites pudding, ice chips and small teaspoon sips water with suspected difficulty coordinating respirations and swallow. Recommending pt not have meal trays and consume several ice chips if alert enough and respirations stable. Pt may need alternative short term nutrition. ST will plan to follow up tomorrow.   SLP Visit Diagnosis: Dysphagia, unspecified (R13.10)    Aspiration Risk  Moderate aspiration risk    Diet  Recommendation Ice chips PRN after oral care(NPO )   Medication Administration: Via alternative means    Other  Recommendations Oral Care Recommendations: Oral care QID   Follow up Recommendations Other (comment)(TBD)      Frequency and Duration min 2x/week  2 weeks       Prognosis Prognosis for Safe Diet Advancement: (fair-good)      Swallow Study   General HPI: 44 year old male with a past medical history significant for heavy alcohol abuse came to the [facility] long emergency room complaining of hip pain, nausea after a fall recently. Bloody emesis and bit his tongue prior to admission. Found to have sepsis secondary to MSSA bacteremia, severe hypokalemia/hypomagnesemia, sever thrombocypenia and question of GI bleed. Head CT negative. CXR Low lung volumes. Nodular opacity in the left mid lung probably. Significant coughing on juice with RN this morning.  Type of Study: Bedside Swallow Evaluation Previous Swallow Assessment: (none) Diet Prior to this Study: Thin liquids;Other (Comment)(clear liquids) Temperature Spikes Noted: Yes Respiratory Status: Room air History of Recent Intubation: No Behavior/Cognition: Lethargic/Drowsy;Requires cueing;Cooperative Oral Cavity Assessment: Other (comment)(? posterior candidia, lips dried blood) Oral Care Completed by SLP: Recent completion by staff Oral Cavity - Dentition: Missing dentition(will assess further) Vision: Functional for self-feeding Self-Feeding Abilities: Needs assist Patient Positioning: Upright in bed Baseline Vocal Quality: Breathy;Low vocal intensity Volitional Cough: Cognitively unable to elicit    Oral/Motor/Sensory Function Overall Oral Motor/Sensory Function: Generalized oral weakness   Ice Chips Ice chips: Impaired Presentation: Self Fed Oral Phase Impairments: Reduced labial seal;Reduced lingual movement/coordination   Thin Liquid Thin Liquid: Impaired Presentation: Spoon Oral Phase Impairments:  (functional) Pharyngeal  Phase Impairments: Change in Vital Signs;Other (comments);Suspected delayed Swallow(increased  respirations)    Nectar Thick Nectar Thick Liquid: Not tested   Honey Thick Honey Thick Liquid: Not tested   Puree Puree: Impaired Presentation: Spoon Pharyngeal Phase Impairments: Suspected delayed Swallow   Solid     Solid: Not tested      Patrick [name] 08/31/2019,4:50 PM  Patrick [name].Ed Nurse, children's [phone] Office [phone]

## 2019-08-31 NOTE — Progress Notes (Signed)
Pt is now in the Red MEWS d/t HR: 115; RR: 25; LOC: responds to voice. MD aware and advised to administer 4 mg IV Ativan prn for withdrawal s/s. Speech Therapist advised not to give the pt anything by mouth at this time d/t difficulty with swallowing. Placed pt on Red MEWS. Pt is currently resting. Will continue to monitor closely.

## 2019-08-31 NOTE — Progress Notes (Addendum)
PROGRESS NOTE    Patrick [name]  MRN:[id number] DOB: 08/26/1975 DOA: 08/28/2019 PCP: Patient, No Pcp Per    Chief Complaint  Patient presents with  . Alcohol Intoxication  . Fatigue  . Nausea  . Hematemesis    Brief Narrative: HPI per Dr. Rawla Patrick [name] is a 43 y.o. male with medical history significant of hypertension, eczema, migraine headaches, alcohol abuse, nicotine use.  Patient does have a history of heavy smoking, does drink heavily.  Patient drinks 6-10 beers and also 6 shots of hard liquor a day.  Today he was trying to stand up sit in a chair when he slipped and he fell and hurt his right hip.  Patient lay on the side for some time EMS was called and got him to the ER.  As per EMS the patient was covered with feces.  Patient states that his last drink of alcohol was today around noontime.  Did have nausea and vomitings.  The patient is a very poor historian.  Getting history from him is very difficult.  As per the ER notes there was mention of some bloody vomiting.  Patient was found to have some blood around his mouth and also did bite his tongue.  Not sure whether he had any seizure. Patient has abnormal blood work and PCCM was consulted and was evaluated by Dr McQuaid who thought the patient could be admitted to the hospitalist service.  Patient was treated with IV fluids in the ER.  Patient complains of generalized body aches.  Patient lives at home by himself, uses a walker to move around.  Patient had a temperature of 101.2 Fahrenheit, tachycardic, lactic acid greater than 11, concern for aspiration pneumonia due to his vomitings, patient was treated with broad-spectrum antibiotics in the ER.  His LFTs were elevated.  Platelets were low at 17,000.  Patient was also found to have significant hypokalemia and hypomagnesemia and electrolytes are being replaced.  ED Course: Patient was treated with IV fluids, potassium and magnesium were replaced in the ER, his lactic acid level has  come down.  CT of the abdomen and pelvis with contrast done in the ER showed marked diffuse hepatic steatosis, bilateral nonobstructing nephrolithiasis, chronic superior endplate compression deformity of L2 with mild bony retropulsion.  Assessment & Plan:   Principal Problem:   Severe sepsis with acute organ dysfunction due to methicillin susceptible Staphylococcus aureus (MSSA) (HCC) Active Problems:   MSSA bacteremia   Alcohol withdrawal (HCC)   Hypokalemia   Sepsis with acute liver failure without hepatic coma or septic shock (HCC)   Transaminitis  1 sepsis secondary to MSSA bacteremia, POA Patient met criteria for sepsis on admission with tachycardia, lactic acidosis, fever of 101.  Blood cultures positive for MSSA.  Urinalysis nitrite negative, leukocytes negative.  Chest x-ray negative for any acute infiltrate.  2D echo with a EF of 55 to 60%, no wall motion abnormalities, grade 1 diastolic dysfunction.  No vegetations noted.  May need a TEE.  Continue IV Ancef.  ID following and appreciate input and recommendations.   2.Severehypokalemia/hypomagnesemia/hypocalcemia/hypophosphatemia Likely secondary to alcohol abuse.  Potassium at 3.1 this morning.  Phosphorus was at 1.7, magnesium repleted at 1.6.  Give K. Dur 10 mEq IV every 1 hour x4 runs.  Patient agitated and likely going through withdrawal and as such difficult to give oral medications per RN.  K-Phos 30 mmol IV x1.  Vitamin D 25 hydroxy significantly depressed at 7.88. PTH pending.  Placed on Os-Cal   with vitamin D 3 times daily.  Follow.  3.  Anemia of chronic disease/severe thrombocytopenia Likely secondary to bone marrow suppression from ongoing alcohol abuse and MSSA bacteremia with underlying liver disease and concern for hepatitis.  Anemia panel consistent with anemia of chronic disease. Check a haptoglobin.  Patient with melanotic stools per RN this morning.  Platelet count at 34 from 20 from 11 from 17 on admission.  Last  platelet count noted was 124 on 04/10/2018.  Decreasing platelets likely a dilutional component as well.  Transfusion threshold hemoglobin < 7.  Transfuse for platelets < 10 or acute bleed.  Due to severe thrombocytopenia hematology consulted and are following.   4.  Transaminitis/positive hepatitis C antibody Likely secondary to alcohol abuse.  CT abdomen and pelvis with diffuse hepatic steatosis.  Acute hepatitis panel positive hep C viral antibody.  Hepatitis C viral load pending. ID following.   5.  Alcohol abuse Patient with significant alcohol abuse.  Patient with signs of withdrawal early on this morning and last night as well as being aggressive towards the staff.  Patient on the Ativan withdrawal protocol.  Per RN patient not taking oral intake and as such we will discontinue Librium detox protocol.  Continue multivitamin, folic acid.  Placed on thiamine 500 mg IV 3 times daily x3 days, then back to thiamine 100 mg daily.  Supportive care.  Follow.   6.  Dehydration Saline lock IV fluids.  7.??  Seizure like activity Concern likely secondary to alcohol abuse.  Patient noted on admission to have a tongue bite.  Head CT negative for any acute abnormalities.  EEG with no seizures or epileptiform discharges seen throughout the recording.  Patient with signs of alcohol withdrawal and aggression.    8.??  GI bleed, POA Per admitting physician ED physician noted patient had some bloody vomitus however no bloody vomitus noted at time of admission.  Hemoglobin trending down felt to be possibly dilutional.  Per RN patient with melanotic stools this morning.  Hemoglobin at 7.8 from 11.9 on admission.  Due to decreasing hemoglobin, concern for melanotic stools, history of heavy alcohol use consult with GI for further evaluation and management.  Continue IV PPI.  Repeat H&H this afternoon.  9.??  Volume overload Patient with some coarse breath sounds noted on examination.  Patient positive for liters  during this hospitalization as patient aggressively hydrated during admission.  Check a chest x-ray.  Check a BNP.  Lasix 40 mg IV x1.  Strict I's and O's.  Daily weights.   DVT prophylaxis: SCDs/patient with severe thrombocytopenia Code Status: Full Family Communication: No family at bedside. Disposition:   Status is: Inpatient    Dispo: The patient is from: Home              Anticipated d/c is to: Likely home with home health versus SNF              Anticipated d/c date is: To be determined.  Pending work-up of bacteremia.              Patient currently with MSSA bacteremia, currently started on IV antibiotics, ID and hematology following.  Patient with severe thrombocytopenia.  Patient with severe alcohol abuse on the Ativan withdrawal protocol.  Patient also noted to have melanotic stools (08/31/2019) with decreasing hemoglobin.        Consultants:   ID: Dr. Baxter Flattery 08/29/2019  Hematology/oncology Dr. Alvy Bimler 08/29/2019  Gastroenterology: Dr. Cristina Gong 08/31/2019  Procedures:  2D  echo 08/29/2019  CT head 08/29/2019  CT abdomen and pelvis 08/28/2019  Chest x-ray 08/28/2019  EEG 08/29/2019  Antimicrobials:   IV Ancef 08/29/2019   Subjective: Patient alert.  Less agitated today.  Per RN patient not swallowing as well and as such hesitant to give oral medications.  Per RN patient with melanotic stools this morning.  Patient denies any chest pain.  Patient denies any shortness of breath.   Objective: Vitals:   08/30/19 2103 08/31/19 0555 08/31/19 0600 08/31/19 0838  BP: (!) 111/93 (!) 137/95  (!) 154/104  Pulse: (!) 105 (!) 108  (!) 109  Resp:  20  20  Temp:  99.6 F (37.6 C)  98.3 F (36.8 C)  TempSrc:  Axillary  Oral  SpO2:  96%  97%  Weight:   85.6 kg     Intake/Output Summary (Last 24 hours) at 08/31/2019 1325 Last data filed at 08/31/2019 0544 Gross per 24 hour  Intake 4395.43 ml  Output 3150 ml  Net 1245.43 ml   Filed Weights   08/29/19 1800 08/31/19 0600    Weight: 86.2 kg 85.6 kg    Examination:  General exam: Alert.  Dry mucous membranes. HEENT: Pupils equal round and reactive to light and accommodation.  Oropharynx with a purplish tongue with bite marks noted on the lateral tongue. Respiratory system: Some coarse breath sounds anterior lung fields.  Normal respiratory effort.  Cardiovascular system: RRR no murmurs rubs or gallops.  No JVD.  No lower extremity edema.  Gastrointestinal system: Abdomen is nontender, nondistended, soft, positive bowel sounds.  No rebound.  No guarding.  Central nervous system: Moving extremities spontaneously. Extremities: Symmetric 5 x 5 power. Skin: Multiple areas of bruising noted.   Psychiatry: Judgement and insight appear poor to fair. Mood & affect appropriate.     Data Reviewed: I have personally reviewed following labs and imaging studies  CBC: Recent Labs  Lab 08/28/19 1412 08/29/19 0430 08/30/19 0712 08/31/19 0542  WBC 8.4 5.7 3.8* 4.0  NEUTROABS 6.1  --   --   --   HGB 11.9* 9.3* 8.7* 7.8*  HCT 33.4* 26.6* 26.2* 23.5*  MCV 99.1 101.1* 106.9* 105.4*  PLT 17* 11* 20* 34*    Basic Metabolic Panel: Recent Labs  Lab 08/28/19 1412 08/29/19 0430 08/29/19 1033 08/30/19 0712 08/31/19 0542  NA 130* 129* 129* 133* 137  K <2.0* <2.0* 2.2* 2.8* 3.1*  CL 85* 89* 93* 100 108  CO2 22 27 24 25 23  GLUCOSE 113* 134* 167* 104* 103*  BUN 14 13 14 13 9  CREATININE 1.14 0.86 0.73 0.77 0.69  CALCIUM 7.3* 7.0* 7.1* 7.3* 7.3*  MG 1.2* 3.3*  --  1.8 1.6*  PHOS  --  1.3*  --  1.2* 1.7*    GFR: CrCl cannot be calculated (Unknown ideal weight.).  Liver Function Tests: Recent Labs  Lab 08/28/19 1412 08/29/19 0430 08/29/19 1033 08/30/19 0712 08/31/19 0542  AST 239* 182* 190* 217* 182*  ALT 106* 84* 85* 73* 56*  ALKPHOS 94 70 69 71 72  BILITOT 5.3* 5.6* 6.3* 4.7* 3.4*  PROT 6.5 5.5* 5.6* 5.2* 4.9*  ALBUMIN 3.2* 2.6* 2.5* 2.5* 2.2*    CBG: Recent Labs  Lab 08/29/19 1008  08/30/19 0800 08/31/19 0814  GLUCAP 169* 98 118*     Recent Results (from the past 240 hour(s))  Blood Culture (routine x 2)     Status: Abnormal   Collection Time: 08/28/19  2:12 PM     Specimen: BLOOD  Result Value Ref Range Status   Specimen Description   Final    BLOOD LEFT ANTECUBITAL Performed at [facility], [address] [place], [place] [address]    Special Requests   Final    BOTTLES DRAWN AEROBIC AND ANAEROBIC Blood Culture adequate volume Performed at [facility], [address] [place], [place] [address]    Culture  Setup Time   Final    GRAM POSITIVE COCCI IN BOTH AEROBIC AND ANAEROBIC BOTTLES CRITICAL RESULT CALLED TO, READ BACK BY AND VERIFIED WITH: E. JACKSON, PHARMD (WL) AT 0949 ON 08/29/19 BY C. JESSUP, MT. Performed at [facility], [address] [place], [place] [address]    Culture STAPHYLOCOCCUS AUREUS (A)  Final   Report Status 08/31/2019 FINAL  Final   Organism ID, Bacteria STAPHYLOCOCCUS AUREUS  Final      Susceptibility   Staphylococcus aureus - MIC*    CIPROFLOXACIN 2 INTERMEDIATE Intermediate     ERYTHROMYCIN <=0.25 SENSITIVE Sensitive     GENTAMICIN <=0.5 SENSITIVE Sensitive     OXACILLIN 0.5 SENSITIVE Sensitive     TETRACYCLINE <=1 SENSITIVE Sensitive     VANCOMYCIN <=0.5 SENSITIVE Sensitive     TRIMETH/SULFA <=10 SENSITIVE Sensitive     CLINDAMYCIN <=0.25 SENSITIVE Sensitive     RIFAMPIN <=0.5 SENSITIVE Sensitive     Inducible Clindamycin NEGATIVE Sensitive     * STAPHYLOCOCCUS AUREUS  Blood Culture ID Panel (Reflexed)     Status: Abnormal   Collection Time: 08/28/19  2:12 PM  Result Value Ref Range Status   Enterococcus species NOT DETECTED NOT DETECTED Final   Listeria monocytogenes NOT DETECTED NOT DETECTED Final   Staphylococcus species DETECTED (A) NOT DETECTED Final    Comment: CRITICAL RESULT CALLED TO, READ BACK BY AND VERIFIED WITH: E. JACKSON, PHARMD (WL) AT 0949 ON 08/29/19 BY C. JESSUP,  MT.    Staphylococcus aureus (BCID) DETECTED (A) NOT DETECTED Final    Comment: Methicillin (oxacillin) susceptible Staphylococcus aureus (MSSA). Preferred therapy is anti staphylococcal beta lactam antibiotic (Cefazolin or Nafcillin), unless clinically contraindicated. CRITICAL RESULT CALLED TO, READ BACK BY AND VERIFIED WITH: E. JACKSON, PHARMD (WL) AT 0949 ON 08/29/19 BY C. JESSUP, MT.    Methicillin resistance NOT DETECTED NOT DETECTED Final   Streptococcus species NOT DETECTED NOT DETECTED Final   Streptococcus agalactiae NOT DETECTED NOT DETECTED Final   Streptococcus pneumoniae NOT DETECTED NOT DETECTED Final   Streptococcus pyogenes NOT DETECTED NOT DETECTED Final   Acinetobacter baumannii NOT DETECTED NOT DETECTED Final   Enterobacteriaceae species NOT DETECTED NOT DETECTED Final   Enterobacter cloacae complex NOT DETECTED NOT DETECTED Final   Escherichia coli NOT DETECTED NOT DETECTED Final   Klebsiella oxytoca NOT DETECTED NOT DETECTED Final   Klebsiella pneumoniae NOT DETECTED NOT DETECTED Final   Proteus species NOT DETECTED NOT DETECTED Final   Serratia marcescens NOT DETECTED NOT DETECTED Final   Haemophilus influenzae NOT DETECTED NOT DETECTED Final   Neisseria meningitidis NOT DETECTED NOT DETECTED Final   Pseudomonas aeruginosa NOT DETECTED NOT DETECTED Final   Candida albicans NOT DETECTED NOT DETECTED Final   Candida glabrata NOT DETECTED NOT DETECTED Final   Candida krusei NOT DETECTED NOT DETECTED Final   Candida parapsilosis NOT DETECTED NOT DETECTED Final   Candida tropicalis NOT DETECTED NOT DETECTED Final    Comment: Performed at [facility], [address] [place], [place] [address]  Blood Culture (routine x 2)       Status: Abnormal   Collection Time: 08/28/19  2:27 PM   Specimen: BLOOD  Result Value Ref Range Status   Specimen Description   Final    BLOOD RIGHT ANTECUBITAL Performed at [facility], [address]  [place], [place] [address]    Special Requests   Final    BOTTLES DRAWN AEROBIC AND ANAEROBIC Blood Culture adequate volume Performed at [facility], [address] [place], [place] [address]    Culture  Setup Time   Final    GRAM POSITIVE COCCI IN CLUSTERS ANAEROBIC BOTTLE ONLY CRITICAL VALUE NOTED.  VALUE IS CONSISTENT WITH PREVIOUSLY REPORTED AND CALLED VALUE.    Culture (A)  Final    STAPHYLOCOCCUS AUREUS SUSCEPTIBILITIES PERFORMED ON PREVIOUS CULTURE WITHIN THE LAST 5 DAYS. Performed at  Hospital Lab, [address] [place], [place] [address]    Report Status 08/31/2019 FINAL  Final  Respiratory Panel by RT PCR (Flu A&B, Covid) - Nasopharyngeal Swab     Status: None   Collection Time: 08/28/19  3:30 PM   Specimen: Nasopharyngeal Swab  Result Value Ref Range Status   SARS Coronavirus 2 by RT PCR NEGATIVE NEGATIVE Final    Comment: (NOTE) SARS-CoV-2 target nucleic acids are NOT DETECTED. The SARS-CoV-2 RNA is generally detectable in upper respiratoy specimens during the acute phase of infection. The lowest concentration of SARS-CoV-2 viral copies this assay can detect is 131 copies/mL. A negative result does not preclude SARS-Cov-2 infection and should not be used as the sole basis for treatment or other patient management decisions. A negative result may occur with  improper specimen collection/handling, submission of specimen other than nasopharyngeal swab, presence of viral mutation(s) within the areas targeted by this assay, and inadequate number of viral copies (<131 copies/mL). A negative result must be combined with clinical observations, patient history, and epidemiological information. The expected result is Negative. Fact Sheet for Patients:  https://www.fda.gov/media/142436/download Fact Sheet for Healthcare Providers:  https://www.fda.gov/media/142435/download This test is not yet ap proved or cleared by the [place] FDA and  has been  authorized for detection and/or diagnosis of SARS-CoV-2 by FDA under an Emergency Use Authorization (EUA). This EUA will remain  in effect (meaning this test can be used) for the duration of the COVID-19 declaration under Section 564(b)(1) of the Act, 21 U.S.C. section 360bbb-3(b)(1), unless the authorization is terminated or revoked sooner.    Influenza A by PCR NEGATIVE NEGATIVE Final   Influenza B by PCR NEGATIVE NEGATIVE Final    Comment: (NOTE) The Xpert Xpress SARS-CoV-2/FLU/RSV assay is intended as an aid in  the diagnosis of influenza from Nasopharyngeal swab specimens and  should not be used as a sole basis for treatment. Nasal washings and  aspirates are unacceptable for Xpert Xpress SARS-CoV-2/FLU/RSV  testing. Fact Sheet for Patients: https://www.fda.gov/media/142436/download Fact Sheet for Healthcare Providers: https://www.fda.gov/media/142435/download This test is not yet approved or cleared by the [place] FDA and  has been authorized for detection and/or diagnosis of SARS-CoV-2 by  FDA under an Emergency Use Authorization (EUA). This EUA will remain  in effect (meaning this test can be used) for the duration of the  Covid-19 declaration under Section 564(b)(1) of the Act, 21  U.S.C. section 360bbb-3(b)(1), unless the authorization is  terminated or revoked. Performed at [facility], [address] [place],  [address]   Urine culture     Status: None   Collection Time: 08/28/19  4:26 PM   Specimen: In/Out   Cath Urine  Result Value Ref Range Status   Specimen Description   Final    IN/OUT CATH URINE Performed at [facility] [address] [place], [place] [address]    Special Requests   Final    NONE Performed at [facility], [place] [address], [place], [place] [address]    Culture   Final    NO GROWTH Performed at [facility], [place] [address], [place], [place] [address]    Report  Status 2019/09/10 FINAL  Final  Culture, blood (Routine X 2) w Reflex to ID Panel     Status: None (Preliminary result)   Collection Time: 08/30/19  1:16 PM   Specimen: Right Antecubital; Blood  Result Value Ref Range Status   Specimen Description   Final    RIGHT ANTECUBITAL Performed at [facility] [address], [place], [place] [address]    Special Requests   Final    BOTTLES DRAWN AEROBIC AND ANAEROBIC Blood Culture adequate volume Performed at [facility] [address] [place], [place] [address]    Culture   Final    NO GROWTH < 24 HOURS Performed at [facility] [address], [place], [place] [address]    Report Status PENDING  Incomplete  Culture, blood (Routine X 2) w Reflex to ID Panel     Status: None (Preliminary result)   Collection Time: 08/30/19  1:16 PM   Specimen: BLOOD RIGHT HAND  Result Value Ref Range Status   Specimen Description   Final    BLOOD RIGHT HAND Performed at [facility] [address], [place], [place] [address]    Special Requests   Final    BOTTLES DRAWN AEROBIC ONLY Blood Culture adequate volume Performed at [facility] [address] [place], [place] [address]    Culture   Final    NO GROWTH < 24 HOURS Performed at [facility] [address], [place], [place] [address]    Report Status PENDING  Incomplete         Radiology Studies: EEG adult  Result Date: 10-Sep-2019 Lora Havens, MD     09-10-19  4:16 PM Patient Name: Patrick [name] MRN: [id number] Epilepsy Attending: Lora Havens Referring Physician/Provider: Dr. Irine Seal Date: 09-10-2019 Duration: 24.14 min Patient history: 44 year old male with altered mental status.  EEG evaluate for seizures. Level of alertness: Awake,asleep AEDs during EEG study: None Technical aspects: This EEG study was done with scalp electrodes positioned according to the 10-20 International  system of electrode placement. Electrical activity was acquired at a sampling rate of 500Hz and reviewed with a high frequency filter of 70Hz and a low frequency filter of 1Hz. EEG data were recorded continuously and digitally stored. Description: The posterior dominant rhythm consists of 8-9 Hz activity of moderate voltage (25-35 uV) seen predominantly in posterior head regions, symmetric and reactive to eye opening and eye closing. Sleep was characterized by sleep spindles (12-14hz), maximal frontocentral region. Hyperventilation and photic stimulation were not performed.  Of note, eeg was technically difficult due to significant myogenic and electrode artifact. IMPRESSION: This technically difficult study is within normal limits. No seizures or epileptiform discharges were seen throughout the recording. Priyanka Barbra Sarks        Scheduled Meds: . calcium-vitamin D  2 tablet Oral TID  . feeding supplement  1 Container Oral BID BM  . feeding supplement (  PRO-STAT SUGAR FREE 64)  30 mL Oral BID  . folic acid  1 mg Oral Daily  . folic acid  1 mg Intravenous Daily  . lisinopril  5 mg Oral Daily  . LORazepam  0-4 mg Intravenous Q6H   Followed by  . [START ON 09/02/2019] LORazepam  0-4 mg Intravenous Q12H  . multivitamin with minerals  1 tablet Oral Daily  . multivitamin with minerals  1 tablet Oral Daily  . nicotine  21 mg Transdermal Daily  . [START ON 09/04/2019] pantoprazole  40 mg Intravenous Q12H  . potassium chloride  40 mEq Oral Q4H  . thiamine injection  100 mg Intravenous Daily  . thiamine  100 mg Oral Daily   Or  . thiamine  100 mg Intravenous Daily   Continuous Infusions: . 0.9 % NaCl with KCl 40 mEq / L 75 mL/hr (08/31/19 0950)  .  ceFAZolin (ANCEF) IV 2 g (08/31/19 0540)  . magnesium sulfate bolus IVPB 4 g (08/31/19 1138)  . pantoprozole (PROTONIX) infusion    . pantoprazole (PROTONIX) IVPB    . potassium PHOSPHATE IVPB (in mmol) 30 mmol (08/31/19 1140)     LOS: 3 days     Time spent: 45 minutes    Daniel Thompson, MD [facility]   To contact the attending provider between 7A-7P or the covering provider during after hours 7P-7A, please log into the web site [email or web address] and access using universal [place] password for that web site. If you do not have the password, please call the hospital operator.  08/31/2019, 1:25 PM   

## 2019-08-31 NOTE — Progress Notes (Signed)
During bedside report, pt noted to be tachypneic, grey-coloring around lips, telemetry laying on the bed pulled off by patient. Placed back on patient. Pt agitated and grabbing/hitting staff and trying to get OOB. Placed in mittens and pt biting mittens and attempting to take off. BP 164/128, HR 120, CIWA 21 and given 4mg  of ativan. Pt had received 4mg  of ativan at 1608 as well with minimal response. RRT called per CIWA protocol scoring and red MEWS. , RN to bedside and made decision with E. Ouma, NP to move to higher level of care.

## 2019-08-31 NOTE — Consult Note (Signed)
Referring Provider:  Dr. Ramiro Harvest Primary Care Physician:  Patient, No Pcp Per Primary Gastroenterologist: None (unassigned)  Reason for Consultation: Melenic stool, anemia  HPI: Patrick [name] is a 44 y.o. male with history of ethanol abuse admitted to the hospital 3 days ago after falling and hurting his right hip.  He was in severe metabolic disarray at time of admission and has shown signs of sepsis with staph bacteremia.    We are being called because the nurse noted a couple of large melenic stools this afternoon.  These are confirmed to be Hemoccult positive.  The patient has been somewhat tachycardic consistent with alcohol withdrawal, and his blood pressure has been elevated also consistent with withdrawal as opposed to volume depletion.  He has been maintained on PPI since admission.  Apparently no prior history of GI bleeding.    His hemoglobin has gradually been drifting downward during hospitalization, from a baseline of 17.6 approximately a year and a half ago, to an admission level of 11.9 on this hospitalization, which has further declined to 7.8 as of this morning with significant hydration.  On recheck today, it has remained stable at 8.3, and his BUN is normal as of this morning at a level of 9.  The patient did develop profound thrombocytopenia during this admission, getting down as low as 11,000 as of 2 days ago, which was felt to be multifactorial by the hematologist (including sepsis, alcohol, liver disease).  However, when checked this morning, the platelets have risen to 34,000.   Past Medical History:  Diagnosis Date  . Eczema   . Hypertension   . Migraine headache     History reviewed. No pertinent surgical history.  Prior to Admission medications   Medication Sig Start Date End Date Taking? Authorizing Provider  ibuprofen (ADVIL,MOTRIN) 200 MG tablet Take 200-800 mg by mouth every 6 (six) hours as needed for moderate pain.   Yes [provider]   lisinopril (ZESTRIL) 5 MG tablet Take 5 mg by mouth daily.   Yes [provider]  Multiple Vitamin (MULTIVITAMIN PO) Take 1 tablet by mouth daily as needed (nutrition).   Yes [provider]  acetaminophen (TYLENOL) 325 MG tablet Take 3 tablets (975 mg total) by mouth every 6 (six) hours as needed for mild pain. Patient not taking: Reported on 04/10/2018 08/27/17   Barnetta Chapel, PA-C  docusate sodium (COLACE) 100 MG capsule Take 1 capsule (100 mg total) by mouth 2 (two) times daily. Patient not taking: Reported on 04/10/2018 08/27/17   Barnetta Chapel, PA-C  lisinopril (PRINIVIL,ZESTRIL) 5 MG tablet Take 1 tablet (5 mg total) by mouth daily. 04/10/18 05/10/18  Curatolo, Adam, DO  methocarbamol (ROBAXIN) 500 MG tablet Take 1 tablet (500 mg total) by mouth every 8 (eight) hours as needed for muscle spasms. Patient not taking: Reported on 04/10/2018 08/27/17   Barnetta Chapel, PA-C  Oxycodone HCl 10 MG TABS Take 1 tablet (10 mg total) by mouth every 4 (four) hours as needed. Patient not taking: Reported on 04/10/2018 08/27/17   Barnetta Chapel, PA-C    Current Facility-Administered Medications  Medication Dose Route Frequency Provider Last Rate Last Admin  . 0.9 % NaCl with KCl 40 mEq / L  infusion   Intravenous Continuous Rodolph Bong, MD 75 mL/hr at 08/31/19 0950 75 mL/hr at 08/31/19 0950  . acetaminophen (TYLENOL) tablet 650 mg  650 mg Oral Q6H PRN Arelia Sneddon, MD   650 mg at 08/29/19 1542   Or  .  acetaminophen (TYLENOL) suppository 650 mg  650 mg Rectal Q6H PRN Rawla, Claudie Revering, MD      . calcium-vitamin D (OSCAL WITH D) 500-200 MG-UNIT per tablet 2 tablet  2 tablet Oral TID Rodolph Bong, MD      . ceFAZolin (ANCEF) IVPB 2g/100 mL premix  2 g Intravenous Q8H Cindi Carbon, RPH 200 mL/hr at 08/31/19 1354 2 g at 08/31/19 1354  . chlordiazePOXIDE (LIBRIUM) capsule 25 mg  25 mg Oral Q6H PRN Rodolph Bong, MD   25 mg at 08/30/19 0845  . feeding supplement (BOOST  / RESOURCE BREEZE) liquid 1 Container  1 Container Oral BID BM Rodolph Bong, MD   1 Container at 08/29/19 1749  . feeding supplement (PRO-STAT SUGAR FREE 64) liquid 30 mL  30 mL Oral BID Rodolph Bong, MD   30 mL at 08/30/19 2014  . folic acid (FOLVITE) tablet 1 mg  1 mg Oral Daily Rodolph Bong, MD      . folic acid injection 1 mg  1 mg Intravenous Daily Rodolph Bong, MD   1 mg at 08/31/19 1135  . hydrOXYzine (ATARAX/VISTARIL) tablet 25 mg  25 mg Oral Q6H PRN Rodolph Bong, MD      . hydrOXYzine (VISTARIL) injection 25 mg  25 mg Intramuscular Q6H PRN Rodolph Bong, MD      . lisinopril (ZESTRIL) tablet 5 mg  5 mg Oral Daily Rawla, Prashanth, MD   5 mg at 08/29/19 1525  . loperamide (IMODIUM) capsule 2-4 mg  2-4 mg Oral PRN Rodolph Bong, MD      . LORazepam (ATIVAN) injection 0-4 mg  0-4 mg Intravenous Q6H Rodolph Bong, MD   2 mg at 08/31/19 1356   Followed by  . [START ON 09/02/2019] LORazepam (ATIVAN) injection 0-4 mg  0-4 mg Intravenous Q12H Rodolph Bong, MD      . LORazepam (ATIVAN) tablet 1-4 mg  1-4 mg Oral Q1H PRN Rodolph Bong, MD       Or  . LORazepam (ATIVAN) injection 1-4 mg  1-4 mg Intravenous Q1H PRN Rodolph Bong, MD   4 mg at 08/31/19 1608  . multivitamin with minerals tablet 1 tablet  1 tablet Oral Daily Rodolph Bong, MD   1 tablet at 08/29/19 1749  . nicotine (NICODERM CQ - dosed in mg/24 hours) patch 21 mg  21 mg Transdermal Daily Rawla, Prashanth, MD   21 mg at 08/31/19 1136  . ondansetron (ZOFRAN) tablet 4 mg  4 mg Oral Q6H PRN Rawla, Claudie Revering, MD       Or  . ondansetron (ZOFRAN) injection 4 mg  4 mg Intravenous Q6H PRN Rawla, Prashanth, MD      . pantoprazole (PROTONIX) 80 mg in sodium chloride 0.9 % 100 mL (0.8 mg/mL) infusion  8 mg/hr Intravenous Continuous Rodolph Bong, MD 10 mL/hr at 08/31/19 1609 8 mg/hr at 08/31/19 1609  . [START ON 09/04/2019] pantoprazole (PROTONIX) injection 40 mg  40 mg Intravenous  Q12H Rodolph Bong, MD      . phenol [facility]) mouth spray 1 spray  1 spray Mouth/Throat PRN Rodolph Bong, MD      . potassium chloride (KLOR-CON) packet 40 mEq  40 mEq Oral Q4H Rodolph Bong, MD   40 mEq at 08/31/19 1136  . senna-docusate (Senokot-S) tablet 1 tablet  1 tablet Oral QHS PRN Arelia Sneddon, MD      . thiamine tablet  100 mg  100 mg Oral Daily Eugenie Filler, MD       Or  . thiamine (B-1) injection 100 mg  100 mg Intravenous Daily Eugenie Filler, MD        Allergies as of 08/28/2019 - Review Complete 08/28/2019  Allergen Reaction Noted  . Mushroom extract complex Diarrhea and Other (See Comments) 10/15/2011    History reviewed. No pertinent family history.  Social History   Socioeconomic History  . Marital status: Single    Spouse name: Not on file  . Number of children: Not on file  . Years of education: Not on file  . Highest education level: Not on file  Occupational History  . Not on file  Tobacco Use  . Smoking status: Current Every Day Smoker    Packs/day: 1.00    Types: Cigarettes  . Smokeless tobacco: Never Used  Substance and Sexual Activity  . Alcohol use: Yes    Comment: every day depends on day of week as to how much  . Drug use: Yes    Types: Marijuana    Comment: every day  . Sexual activity: Not on file  Other Topics Concern  . Not on file  Social History Narrative  . Not on file   Social Determinants of Health   Financial Resource Strain:   . Difficulty of Paying Living Expenses:   Food Insecurity:   . Worried About Charity fundraiser in the Last Year:   . Arboriculturist in the Last Year:   Transportation Needs:   . Film/video editor (Medical):   Marland Kitchen Lack of Transportation (Non-Medical):   Physical Activity:   . Days of Exercise per Week:   . Minutes of Exercise per Session:   Stress:   . Feeling of Stress :   Social Connections:   . Frequency of Communication with Friends and Family:   .  Frequency of Social Gatherings with Friends and Family:   . Attends Religious Services:   . Active Member of Clubs or Organizations:   . Attends Archivist Meetings:   Marland Kitchen Marital Status:   Intimate Partner Violence:   . Fear of Current or Ex-Partner:   . Emotionally Abused:   Marland Kitchen Physically Abused:   . Sexually Abused:     Review of Systems: Unobtainable from this obtunded patient  Physical Exam: Vital signs in last 24 hours: Temp:  [97.7 F (36.5 C)-99.6 F (37.6 C)] 99.4 F (37.4 C) (05/02 1500) Pulse Rate:  [105-115] 115 (05/02 1500) Resp:  [20-25] 25 (05/02 1500) BP: (111-158)/(93-122) 135/108 (05/02 1500) SpO2:  [93 %-100 %] 93 % (05/02 1500) Weight:  [85.6 kg] 85.6 kg (05/02 0600) Last BM Date: 08/31/19  Well-nourished Caucasian male with multiple tattoos lying on his side in bed, awake but essentially noncommunicative, squirming around, somewhat restless, anicteric, no frank pallor, skin warm and well perfused, radial pulse reasonably well perfused.  There are fairly prominent rales anteriorly in the left lung field.  Heart normal.  Abdomen somewhat adipose, but without evident ascites, nor any organomegaly or mass-effect.  No evident abdominal tenderness.  No spider angiomata or jaundice.  Intake/Output from previous day: 05/01 0701 - 05/02 0700 In: 4395.4 [P.O.:960; I.V.:3027.5; IV Piggyback:407.9] Out: 3150 [Urine:3150] Intake/Output this shift: No intake/output data recorded.  Lab Results: Recent Labs    08/29/19 0430 08/29/19 0430 08/30/19 0712 08/31/19 0542 08/31/19 1349  WBC 5.7  --  3.8* 4.0  --  HGB 9.3*   < > 8.7* 7.8* 8.3*  HCT 26.6*   < > 26.2* 23.5* 25.4*  PLT 11*  --  20* 34*  --    < > = values in this interval not displayed.   BMET Recent Labs    08/29/19 1033 08/30/19 0712 08/31/19 0542  NA 129* 133* 137  K 2.2* 2.8* 3.1*  CL 93* 100 108  CO2 24 25 23   GLUCOSE 167* 104* 103*  BUN 14 13 9   CREATININE 0.73 0.77 0.69  CALCIUM  7.1* 7.3* 7.3*   LFT Recent Labs    08/29/19 1033 08/30/19 0712 08/31/19 0542  PROT 5.6*   < > 4.9*  ALBUMIN 2.5*   < > 2.2*  AST 190*   < > 182*  ALT 85*   < > 56*  ALKPHOS 69   < > 72  BILITOT 6.3*   < > 3.4*  BILIDIR 3.6*  --   --   IBILI 2.7*  --   --    < > = values in this interval not displayed.   PT/INR Recent Labs    08/29/19 0430  LABPROT 16.3*  INR 1.4*    Studies/Results: No results found.  Impression: The overall picture, in the absence of abrupt drop in hemoglobin or hemodynamic instability, is suggestive of diffuse mucosal hemorrhage as a consequence of the patient's transient severe thrombocytopenia.  He does not show evidence of hemodynamic instability or active, destabilizing bleeding at this time.  Case discussed with Dr. 10/31/19.  Plan: 1.  Continue to monitor hemoglobin, platelet count, and BUN.  2.  Agree with IV Protonix infusion; if IV access is needed, okay to go to every 12 hours IV dosing.  3.  If the patient were to show signs of brisk, active, destabilizing GI hemorrhage, I would favor moving him to a stepdown unit, transfusing platelets, starting octreotide empirically, and arranging endoscopic evaluation.  However, at the moment, none of that seems to be necessary.  4.  We will follow with you.  Please call 08/31/19 if you have questions or if there is a significant change in the patient's status.   LOS: 3 days   Janee Morn Dmitriy Gair  08/31/2019, 4:51 PM   Pager [phone] If no answer or after 5 PM call [phone]

## 2019-08-31 NOTE — Significant Event (Signed)
Rapid Response Event Note  Overview: Time Called: 1936 Arrival Time: 1942 Event Type: Respiratory, MEWS, Other (Comment)(Detox CIWA score of 21)  Initial Focused Assessment: Primary nurse called regarding patient MEW scores, and CIWA score of 21. Patient received 4 mg of Ativan at 1936. Patient remains agitated, grabbing at staff and trying to get out of bed. Mittens remain in place;  However he is biting mittens and attempting to take off. Patient remains tachypneic, with an  elevated blood pressure, and is in  sinus tachycardia per monitor.  See vital signs sheet. Primary nurse notified Ouma NP, transfer orders tor stepdown.    Interventions: Transfer to SD  Start on Precedex Drip  Monitor CIWA   Event Summary: Name of Physician Notified: NP [facility], Ouma at 1946  Outcome: Transferred (Comment)\ Transfer to SDU  Start Precedex  GTT    Event End Time: 2010   [facility] MSN, RN-BC  [facility] ICU/Stepdown  [facility]

## 2019-09-01 DIAGNOSIS — F10231 Alcohol dependence with withdrawal delirium: Secondary | ICD-10-CM

## 2019-09-01 DIAGNOSIS — R6520 Severe sepsis without septic shock: Secondary | ICD-10-CM

## 2019-09-01 DIAGNOSIS — R0602 Shortness of breath: Secondary | ICD-10-CM

## 2019-09-01 DIAGNOSIS — E8770 Fluid overload, unspecified: Secondary | ICD-10-CM

## 2019-09-01 DIAGNOSIS — B9561 Methicillin susceptible Staphylococcus aureus infection as the cause of diseases classified elsewhere: Secondary | ICD-10-CM

## 2019-09-01 DIAGNOSIS — R7881 Bacteremia: Secondary | ICD-10-CM

## 2019-09-01 LAB — HCV RNA QUANT
HCV Quantitative Log: 6.167 log10 IU/mL (ref >1.70)
HCV Quantitative: 1470000 IU/mL (ref >50)

## 2019-09-01 LAB — COMPREHENSIVE METABOLIC PANEL
ALT: 48 U/L — ABNORMAL HIGH (ref 0–44)
AST: 159 U/L — ABNORMAL HIGH (ref 15–41)
Albumin: 2.2 g/dL — ABNORMAL LOW (ref 3.5–5.0)
Alkaline Phosphatase: 69 U/L (ref 38–126)
Anion gap: 5 (ref 5–15)
BUN: 8 mg/dL (ref 6–20)
CO2: 25 mmol/L (ref 22–32)
Calcium: 7.2 mg/dL — ABNORMAL LOW (ref 8.9–10.3)
Chloride: 104 mmol/L (ref 98–111)
Creatinine, Ser: 0.67 mg/dL (ref 0.61–1.24)
GFR calc Af Amer: >60 mL/min (ref >60)
GFR calc non Af Amer: >60 mL/min (ref >60)
Glucose, Bld: 91 mg/dL (ref 70–99)
Potassium: 3.7 mmol/L (ref 3.5–5.1)
Sodium: 134 mmol/L — ABNORMAL LOW (ref 135–145)
Total Bilirubin: 2.8 mg/dL — ABNORMAL HIGH (ref 0.3–1.2)
Total Protein: 5 g/dL — ABNORMAL LOW (ref 6.5–8.1)

## 2019-09-01 LAB — CBC
HCT: 23.5 % — ABNORMAL LOW (ref 39.0–52.0)
Hemoglobin: 7.6 g/dL — ABNORMAL LOW (ref 13.0–17.0)
MCH: 35.2 pg — ABNORMAL HIGH (ref 26.0–34.0)
MCHC: 32.3 g/dL (ref 30.0–36.0)
MCV: 108.8 fL — ABNORMAL HIGH (ref 80.0–100.0)
Platelets: 47 10*3/uL — ABNORMAL LOW (ref 150–400)
RBC: 2.16 MIL/uL — ABNORMAL LOW (ref 4.22–5.81)
RDW: 14.6 % (ref 11.5–15.5)
WBC: 5.3 10*3/uL (ref 4.0–10.5)
nRBC: 1.7 % — ABNORMAL HIGH (ref 0.0–0.2)

## 2019-09-01 LAB — MAGNESIUM: Magnesium: 1.6 mg/dL — ABNORMAL LOW (ref 1.7–2.4)

## 2019-09-01 LAB — GLUCOSE, CAPILLARY
Glucose-Capillary: 107 mg/dL — ABNORMAL HIGH (ref 70–99)
Glucose-Capillary: 106 mg/dL — ABNORMAL HIGH (ref 70–99)

## 2019-09-01 LAB — PHOSPHORUS: Phosphorus: 3.3 mg/dL (ref 2.5–4.6)

## 2019-09-01 MED ORDER — FUROSEMIDE 10 MG/ML IJ SOLN
20.0000 mg | Freq: Once | INTRAMUSCULAR | Status: AC
  Administered 2019-09-01: 20 mg via INTRAVENOUS
  Filled 2019-09-01: qty 2

## 2019-09-01 MED ORDER — THIAMINE HCL 100 MG/ML IJ SOLN
500.0000 mg | Freq: Three times a day (TID) | INTRAVENOUS | Status: AC
  Administered 2019-09-01 – 2019-09-03 (×8): 500 mg via INTRAVENOUS
  Filled 2019-09-01 (×10): qty 5

## 2019-09-01 MED ORDER — POTASSIUM CHLORIDE 20 MEQ PO PACK
40.0000 meq | PACK | Freq: Once | ORAL | Status: AC
  Administered 2019-09-01: 40 meq via ORAL
  Filled 2019-09-01: qty 2

## 2019-09-01 MED ORDER — LIP MEDEX EX OINT
1.0000 "application " | TOPICAL_OINTMENT | CUTANEOUS | Status: DC | PRN
  Filled 2019-09-01: qty 7

## 2019-09-01 MED ORDER — LORAZEPAM 2 MG/ML IJ SOLN: 1.0000 mg | INTRAMUSCULAR | Status: DC | PRN

## 2019-09-01 MED ORDER — THIAMINE HCL 100 MG PO TABS
100.0000 mg | ORAL_TABLET | Freq: Every day | ORAL | Status: DC
  Administered 2019-09-04 – 2019-09-05 (×2): 100 mg via ORAL
  Filled 2019-09-01 (×2): qty 1

## 2019-09-01 MED ORDER — MAGNESIUM SULFATE 4 GM/100ML IV SOLN
4.0000 g | Freq: Once | INTRAVENOUS | Status: AC
  Administered 2019-09-01: 09:00:00 4 g via INTRAVENOUS
  Filled 2019-09-01: qty 100

## 2019-09-01 NOTE — Consult Note (Signed)
NAME:  Patrick [name], MRN:  [id number], DOB:  November 30, 1975, LOS: 4 ADMISSION DATE:  08/28/2019, CONSULTATION DATE:  4/29 REFERRING MD:  Laveda Norman, CHIEF COMPLAINT:  Fall, hip pain   Brief History   44 year old male with a past medical history significant for heavy alcohol abuse came to the [facility] long emergency room complaining of hip pain, nausea after a fall recently.  Has been drinking heavily and has had poor p.o. intake. PCCM initially consulted for markedly elevated lactic acid. This resolved w/ IFVs.   Hospital course from 4/29-->5/3 4/29 admitted with febrile illness, cultures obtained, broad-spectrum antibiotics initiated, fluid volume resuscitation initiated, CIWA protocol started, folate administered. 4/30: Blood cultures were positive for MSSA antibiotics narrowed to IV Ancef, infectious disease consulted.  Platelet count 17,000,Felt secondary to bone marrow suppression from EtOH, as well as sepsis in the setting of underlying liver dysfunction 5/2 GI consulted for melanic stool and anemia, worsening signs of alcohol withdrawal, becoming aggressive towards staff, not able to take Librium detox, spitting them out at staff.  Rapid response called, moved to intensive care for Librium 5/3: Critical care reconsulted as patient will requiring Precedex infusion Past Medical History  Migraine headaches Hypertension eczema  Significant Hospital Events   4/29 admitted 4/29 admitted with febrile illness, cultures obtained, broad-spectrum antibiotics initiated, fluid volume resuscitation initiated, CIWA protocol started, folate administered. 4/30: Blood cultures were positive for MSSA antibiotics narrowed to IV Ancef, infectious disease consulted.  Platelet count 17,000,Felt secondary to bone marrow suppression from EtOH, as well as sepsis in the setting of underlying liver dysfunction 5/2 GI consulted for melanic stool and anemia, worsening signs of alcohol withdrawal, becoming aggressive towards  staff, not able to take Librium detox, spitting them out at staff.  Rapid response called, moved to intensive care for Librium 5/3: Critical care reconsulted as patient will requiring Precedex infusion  Consults:  PCCM, reconsulted on 5/3 for delirium GI consulted for possible upper GI bleed Infectious disease consulted for MSSA bacteremia Hematology consulted for thrombocytopenia  Procedures:    Significant Diagnostic Tests:  4/29 CT abdomen pelvis: No abdominopelvic findings, marked diffuse hepatic steatosis, bilateral nonobstructive nephrolithiasis, chronic superior endplate compression deformity of L2, aortic atherosclerosis 4/30 echocardiogram:EF 55 to 60% with normal LV function, grade 1 diastolic dysfunction normal RV size no wall motion abnormalities Micro Data:  4/29 SARS CoV2, Flu > negative  Antimicrobials:  4/29 cefepime > 4/29 4/29 vancomycin > 4/29 4/29 flagyl > 4/29 Ancef 4/30>>>  Interim history/subjective:  Moved to the intensive care for agitated delirium, currently on Precedex infusion  Objective   Blood pressure 91/64, pulse 79, temperature 98.2 F (36.8 C), temperature source Axillary, resp. rate 18, weight 81.2 kg, SpO2 98 %.        Intake/Output Summary (Last 24 hours) at 09/01/2019 1003 Last data filed at 09/01/2019 2536 Gross per 24 hour  Intake 688.31 ml  Output 3450 ml  Net -2761.69 ml   Filed Weights   08/29/19 1800 08/31/19 0600 09/01/19 0500  Weight: 86.2 kg 85.6 kg 81.2 kg    Examination:  General this is a disheveled 44 year old white male, speech slurred, restless and tremulous At times  HEENT mucous membranes are dry, sclera nonicteric, neck veins flat Pulmonary: Currently on room air, mildly tachypneic, faint crackles bases, no accessory use currently Cardiac: Regular rate and rhythm mildly tachycardic no murmur rub or gallop Abdomen soft nontender no organomegaly positive bowel sounds Extremities are warm and dry with brisk  capillary refill no edema Neuro  awake, tremulous, moving all extremities, oriented only x1 intermittently    Resolved Hospital Problem list     Assessment & Plan:   Acute toxic and metabolic encephalopathy in the setting of alcohol withdrawal/delirium tremens, further complicated by sepsis, with MSSA bacteremia -Placed on Precedex on 5/3 when agitated delirium got to the point where he would no longer take oral Librium Plan Continue IV Precedex Add back Librium taper and low-dose clonidine Continue thiamine and folate Frequent reorientation Seizure precautions IV hydration Telemetry monitoring Ongoing supportive care Treat infection  MSSA bacteremia Plan Day number 3 Ancef Repeating blood cultures Eventually needs transesophageal echocardiogram Consider imaging right hip at some point given fall  Multifocal pneumonia Portable chest x-ray on 5/2 showed left greater than right bilateral airspace disease Is not present on admission -Echo with normal EF -With worsening delirium would be concerned about aspiration, no fever spikes white blood cell count normal Plan Repeat chest x-ray Pulse oximetry Will hold off from widening antibiotics  Anemia and melena.  Suspect upper GI bleeding likely exacerbated by thrombocytopenia and EtOH abuse Hemoglobin has been relatively stable Plan Continue PPI twice daily GI following  Thrombocytopenia, felt secondary to bone marrow suppression from alcohol intake, sepsis, and underlying liver disease/hepatitis -Platelets slowly improving Plan Supportive care Treating sepsis Alcohol cessation  Fluid and electrolyte imbalance: Hypomagnesemia Plan Replace and recheck in a.m.  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     Erick Colace ACNP-BC [place] Pager # [phone] OR # [phone] if no answer

## 2019-09-01 NOTE — Progress Notes (Signed)
  Speech Language Pathology Treatment: Dysphagia  Patient Details Name: Patrick [name] MRN: [id number] DOB: 10/27/1975 Today's Date: 09/01/2019 Time: 1740-1801 SLP Time Calculation (min) (ACUTE ONLY): 21 min  Assessment / Plan / Recommendation Clinical Impression  Pt passed 3 ounce yale water test despite dyspnea therefore silent aspiration risk if low.  However his RR increases to 30s and thus episodic aspiration risk is high.   SLP observed pt with both nectar thick and thin liquids and pt denied difference with swallow effectiveness comparatively. Pt's voice remains dysphonic with 2-3 breathy words per breath group.  Per RN, pt's wife reported to him that pt has been experiencing progressive dyspnea.  Do not advice to advance diet to allow solids due to increased asp risk with solids- pt agreeable to plan. Will follow up to assess for dietary advancement readiness.  Using teach back, pt educated to findings/recommendations.    HPI HPI: 44 year old male with a past medical history significant for heavy alcohol abuse came to the [facility] long emergency room complaining of hip pain, nausea after a fall recently. Bloody emesis and bit his tongue prior to admission. Found to have sepsis secondary to MSSA bacteremia, severe hypokalemia/hypomagnesemia, sever thrombocypenia and question of GI bleed. Head CT negative. CXR Low lung volumes. Nodular opacity in the left mid lung probably. Significant coughing on juice with RN this morning.       SLP Plan  Continue with current plan of care       Recommendations  Diet recommendations: Thin liquid Liquids provided via: Cup;Straw Medication Administration: (with puree or liquid if tolerated) Supervision: Patient able to self feed Compensations: Slow rate;Small sips/bites Postural Changes and/or Swallow Maneuvers: Seated upright 90 degrees;Upright 30-60 min after meal                Oral Care Recommendations: Oral care QID Follow up Recommendations:  Other (comment) SLP Visit Diagnosis: Dysphagia, unspecified (R13.10) Plan: Continue with current plan of care       GO              Rolena Infante, MS [facility] SLP Acute Rehab Services Office [phone]   Chales Abrahams 09/01/2019, 6:40 PM

## 2019-09-01 NOTE — Progress Notes (Addendum)
Patrick [name]   DOB:[birth year]   LA#:[id number]   I have seen him and agreed with documentation as follows:  ASSESSMENT & PLAN:  Thrombocytopenia Likely due to bone marrow suppression from alcohol intake, sepsis, and underlying liver disease including hepatitis Recommend platelet transfusion if he is actively bleeding; otherwise, he does not need platelet transfusion unless his platelet count is less than 10,000  Anemia of chronic illness and GI bleed Developed heme positive stools over the weekend GI is following and started on PPI with plans to transfuse for hemoglobin less than 7 Reserving EGD for destabilizing bleeding or continued drop in hemoglobin Has not required PRBC transfusion at this point The pattern is not consistent with hemolysis  Sepsis Met sepsis criteria on admission Started on broad-spectrum antibiotics and pancultured Blood cultures were positive for Staph aureus  This can certainly cause severe pancytopenia  Alcohol abuse Alcohol withdrawal CIWA protocol On thiamine, folic acid, and multivitamin  Hepatic steatosis, hep C antibody positive 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 MD  Subjective:  He is seen briefly.  The patient is totally asleep and snoring Spoke with nursing and they have not seen any bleeding today Spoke with his girl friend briefly Rapid response called last evening due to CIWA score of 21 and patient was transferred to stepdown unit  Objective:  Vitals:   09/01/19 0508 09/01/19 0800  BP: 91/64   Pulse: 79   Resp: 18   Temp:  97.6 F (36.4 C)  SpO2: 98%      Intake/Output  Summary (Last 24 hours) at 09/01/2019 1033 Last data filed at 09/01/2019 0523 Gross per 24 hour  Intake 688.31 ml  Output 3450 ml  Net -2761.69 ml    GENERAL: sleeping, no active signs of bleeding SKIN: Multiple bruises to his bilateral arms, nonbleeding scabs to his lips   Labs:  Recent Labs    08/29/19 1033 08/29/19 1033 08/30/19 0712 08/30/19 0712 08/31/19 0542 08/31/19 2105 09/01/19 0331  NA 129*   < > 133*   < > 137 137 134*  K 2.2*   < > 2.8*   < > 3.1* 3.7 3.7  CL 93*   < > 100   < > 108 104 104  CO2 24   < > 25   < > _0 GLUCOSE 167*   < > 104*   < > 103* 110* 91  BUN 14   < > 13   < > _1 CREATININE 0.73   < > 0.77   < > 0.69 0.66 0.67  CALCIUM 7.1*   < > 7.3*   < > 7.3* 7.8* 7.2*  GFRNONAA >60   < > >60   < > >60 >60 >60  GFRAA >60   < > >60   < > >60 >60 >60  PROT 5.6*   < > 5.2*  --  4.9*  --  5.0*  ALBUMIN 2.5*   < > 2.5*  --  2.2*  --  2.2*  AST 190*   < > 217*  --  182*  --  159*  ALT 85*   < > 73*  --  56*  --  48*  ALKPHOS 69   < > 71  --  72  --  69  BILITOT 6.3*   < > 4.7*  --  3.4*  --  2.8*  BILIDIR 3.6*  --   --   --   --   --   --   IBILI 2.7*  --   --   --   --   --   --    < > = values in this interval not displayed.    Studies:  CT HEAD WO CONTRAST  Result Date: 08/29/2019 CLINICAL DATA:  Fall with headache.  Thrombocytopenia EXAM: CT HEAD WITHOUT CONTRAST TECHNIQUE: Contiguous axial images were obtained from the base of the skull through the vertex without intravenous contrast. COMPARISON:  04/10/2018 FINDINGS: Brain: No evidence of swelling, infarction, hemorrhage, hydrocephalus, extra-axial collection or mass lesion/mass effect. Dilated perivascular spaces at/below the basal ganglia. Vascular: No hyperdense vessel or unexpected calcification. Skull: Normal. Negative for fracture or focal lesion. Sinuses/Orbits: No evidence of injury Other: Motion degradation requiring 2 acquisitions. IMPRESSION: No evidence of intracranial injury.  Electronically Signed   By: Monte Fantasia M.D.   On: 08/29/2019 06:05   CT ABDOMEN PELVIS W CONTRAST  Result Date: 08/28/2019 CLINICAL DATA:  Abdominal pain, hematemesis EXAM: CT ABDOMEN AND PELVIS WITH CONTRAST TECHNIQUE: Multidetector CT imaging of the abdomen and pelvis was performed using the standard protocol following bolus administration of intravenous contrast. CONTRAST:  1102m OMNIPAQUE IOHEXOL 300 MG/ML  SOLN COMPARISON:  08/26/2017 FINDINGS: Lower chest: No acute abnormality. Hepatobiliary: Marked diffuse hepatic steatosis. No focal hepatic lesion is identified. Gallbladder appears unremarkable. No hyperdense gallstone. No biliary dilatation. Pancreas: Unremarkable. No pancreatic ductal dilatation or surrounding inflammatory changes. Spleen: Normal in size without focal abnormality. Adrenals/Urinary Tract: 6 mm upper pole right renal calculus. Punctate 2 mm calculus within the midpole of the left kidney. Kidneys enhance symmetrically. No renal lesion or hydronephrosis. Bilateral ureters are unremarkable. Urinary bladder is unremarkable. No adrenal nodule. Stomach/Bowel: Stomach is within normal limits. Appendix appears normal. No evidence of bowel wall thickening, distention, or inflammatory changes. Vascular/Lymphatic: Scattered atherosclerotic calcifications of the abdominal aorta. No aneurysm. No abdominopelvic lymphadenopathy. Reproductive: Prostate is unremarkable. Other: No abdominal wall hernia or abnormality. No abdominopelvic ascites. Musculoskeletal: Chronic moderate superior endplate compression deformity of L2 with mild bony retropulsion, unchanged from prior. No new or acute osseous findings. IMPRESSION: 1. No acute abdominopelvic findings. 2. Marked diffuse hepatic steatosis. 3. Bilateral nonobstructive nephrolithiasis. 4. Chronic superior endplate compression deformity of L2 with mild bony retropulsion. 5. Aortic atherosclerosis. (ICD10-I70.0). Electronically Signed   By: NDavina PokeD.O.   On: 08/28/2019 16:54   [place]dom[place] Limited  Result Date: 08/28/2019 CLINICAL DATA:  Right upper quadrant pain; septic; significant ETOH use. EXAM: ULTRASOUND ABDOMEN LIMITED RIGHT UPPER QUADRANT COMPARISON:  CT abdomen/pelvis 08/28/2019, abdominal ultrasound 04/17/2006. FINDINGS: Gallbladder: There is sludge within the gallbladder. No gallstones are visualized. No appreciable gallbladder wall thickening. No sonographic MPercell Millersign is elicited by the scanning technologist. Common bile duct: Diameter: The common duct is nonvisualized due to obscuration by overlying bowel gas. Liver: No focal lesion identified. Generalized increased hepatic parenchymal echogenicity. Portal vein is patent on color Doppler imaging with normal direction of blood flow towards the liver. IMPRESSION: Gallbladder sludge. No appreciable gallstones or gallbladder wall thickening. The common duct is non-visualized due to obscuration by overlying bowel gas. Hyperechogenicity of the hepatic  parenchyma consistent with marked hepatic steatosis demonstrated on same day CT abdomen/pelvis. Electronically Signed   By: Kellie Simmering DO   On: 08/28/2019 19:30   DG CHEST PORT 1 VIEW  Result Date: 08/31/2019 CLINICAL DATA:  Melanotic stools.  Shortness of breath. EXAM: PORTABLE CHEST 1 VIEW COMPARISON:  08/28/2019 FINDINGS: There are new bilateral hazy airspace opacities, most evident in the left mid lung zone. There is no pneumothorax. There may be small bilateral pleural effusions. The heart size is essentially stable from prior study. There is no evidence for an acute displaced fracture. There is an old fracture of the left clavicle. IMPRESSION: Interval development of bilateral airspace opacities, greatest on the left. Findings are concerning for multifocal pneumonia (viral or bacterial). Electronically Signed   By: Constance Holster M.D.   On: 08/31/2019 18:23   DG Chest Port 1 View  Result Date: 08/28/2019 CLINICAL DATA:   Sepsis weakness EXAM: PORTABLE CHEST 1 VIEW COMPARISON:  09/23/2018, CT 08/26/2017, radiograph 05/26/2016 FINDINGS: Low lung volumes. Mildly elevated right diaphragm. No consolidation or effusion. Heart size within normal limits allowing for low lung volume. No pneumothorax.nodular opacity in the left mid lung probably represents vessel on end. IMPRESSION: Low lung volumes. Nodular opacity in the left mid lung probably represents vascular artifact. Suggest short interval two-view chest radiograph follow-up. Electronically Signed   By: Donavan Foil M.D.   On: 08/28/2019 15:01   EEG adult  Result Date: 08/29/2019 Lora Havens, MD     08/29/2019  4:16 PM Patient Name: Eryk Beavers MRN: [id number] Epilepsy Attending: Lora Havens Referring Physician/Provider: Dr. Irine Seal Date: 08/29/2019 Duration: 24.14 min Patient history: 44 year old male with altered mental status.  EEG evaluate for seizures. Level of alertness: Awake,asleep AEDs during EEG study: None Technical aspects: This EEG study was done with scalp electrodes positioned according to the 10-20 International system of electrode placement. Electrical activity was acquired at a sampling rate of 500Hz and reviewed with a high frequency filter of 70Hz and a low frequency filter of 1Hz. EEG data were recorded continuously and digitally stored. Description: The posterior dominant rhythm consists of 8-9 Hz activity of moderate voltage (25-35 uV) seen predominantly in posterior head regions, symmetric and reactive to eye opening and eye closing. Sleep was characterized by sleep spindles (12-14hz), maximal frontocentral region. Hyperventilation and photic stimulation were not performed.  Of note, eeg was technically difficult due to significant myogenic and electrode artifact. IMPRESSION: This technically difficult study is within normal limits. No seizures or epileptiform discharges were seen throughout the recording. Lora Havens    ECHOCARDIOGRAM COMPLETE  Result Date: 08/29/2019    ECHOCARDIOGRAM REPORT   Patient Name:   Sanad Monteverde Date of Exam: 08/29/2019 Medical Rec #:  [id number]    Height:       71.0 in Accession #:    [id number]   Weight:       180.0 lb Date of Birth:  21-Feb-1976     BSA:          2.016 m Patient Age:    70 years     BP:           142/102 mmHg Patient Gender: M            HR:           104 bpm. Exam Location:  Inpatient Procedure: 2D Echo, Cardiac Doppler and Color Doppler Indications:    CHF  History:  Patient has no prior history of Echocardiogram examinations.                 Risk Factors:Hypertension and Current Smoker. Alcohol abuse.  Sonographer:    Dustin Flock Referring Phys: [phone] DANIEL V THOMPSON  Sonographer Comments: Image acquisition challenging due to uncooperative patient. IMPRESSIONS  1. Left ventricular ejection fraction, by estimation, is 55 to 60%. The left ventricle has normal function. The left ventricle has no regional wall motion abnormalities. Left ventricular diastolic parameters are consistent with Grade I diastolic dysfunction (impaired relaxation).  2. Right ventricular systolic function is normal. The right ventricular size is normal.  3. Left atrial size was mildly dilated.  4. The mitral valve is normal in structure. No evidence of mitral valve regurgitation. No evidence of mitral stenosis.  5. The aortic valve is tricuspid. Aortic valve regurgitation is not visualized. Mild aortic valve sclerosis is present, with no evidence of aortic valve stenosis.  6. The inferior vena cava is normal in size with greater than 50% respiratory variability, suggesting right atrial pressure of 3 mmHg. FINDINGS  Left Ventricle: Left ventricular ejection fraction, by estimation, is 55 to 60%. The left ventricle has normal function. The left ventricle has no regional wall motion abnormalities. The left ventricular internal cavity size was normal in size. There is  no left ventricular  hypertrophy. Left ventricular diastolic parameters are consistent with Grade I diastolic dysfunction (impaired relaxation). Right Ventricle: The right ventricular size is normal. No increase in right ventricular wall thickness. Right ventricular systolic function is normal. Left Atrium: Left atrial size was mildly dilated. Right Atrium: Right atrial size was normal in size. Pericardium: There is no evidence of pericardial effusion. Mitral Valve: The mitral valve is normal in structure. Normal mobility of the mitral valve leaflets. No evidence of mitral valve regurgitation. No evidence of mitral valve stenosis. Tricuspid Valve: The tricuspid valve is normal in structure. Tricuspid valve regurgitation is not demonstrated. No evidence of tricuspid stenosis. Aortic Valve: The aortic valve is tricuspid. Aortic valve regurgitation is not visualized. Mild aortic valve sclerosis is present, with no evidence of aortic valve stenosis. Pulmonic Valve: The pulmonic valve was normal in structure. Pulmonic valve regurgitation is not visualized. No evidence of pulmonic stenosis. Aorta: The aortic root is normal in size and structure. Venous: The inferior vena cava is normal in size with greater than 50% respiratory variability, suggesting right atrial pressure of 3 mmHg. IAS/Shunts: The interatrial septum was not well visualized.  LEFT VENTRICLE PLAX 2D LVIDd:         4.90 cm  Diastology LVIDs:         3.40 cm  LV e' lateral:   10.00 cm/s LV PW:         1.20 cm  LV E/e' lateral: 7.0 LV IVS:        1.10 cm  LV e' medial:    6.85 cm/s LVOT diam:     2.30 cm  LV E/e' medial:  10.3 LV SV:         48 LV SV Index:   24 LVOT Area:     4.15 cm  RIGHT VENTRICLE RV Basal diam:  2.40 cm RV S prime:     6.31 cm/s TAPSE (M-mode): 3.3 cm LEFT ATRIUM             Index       RIGHT ATRIUM           Index LA diam:  3.80 cm 1.88 cm/m  RA Area:     12.00 cm LA Vol (A2C):   31.7 ml 15.72 ml/m RA Volume:   22.40 ml  11.11 ml/m LA Vol (A4C):    52.2 ml 25.89 ml/m LA Biplane Vol: 43.8 ml 21.72 ml/m  AORTIC VALVE LVOT Vmax:   71.80 cm/s LVOT Vmean:  42.900 cm/s LVOT VTI:    0.115 m  AORTA Ao Root diam: 2.80 cm MITRAL VALVE MV Area (PHT): 10.25 cm   SHUNTS MV Decel Time: 74 msec     Systemic VTI:  0.12 m MV E velocity: 70.30 cm/s  Systemic Diam: 2.30 cm MV A velocity: 88.70 cm/s MV E/A ratio:  0.79 Jenkins Rouge MD Electronically signed by Jenkins Rouge MD Signature Date/Time: 08/29/2019/1:12:18 PM    Final    [place] EKG SITE RITE  Result Date: 08/29/2019 If Site Rite image not attached, placement could not be confirmed due to current cardiac rhythm.  [place] EKG SITE RITE  Result Date: 08/29/2019 If Site Rite image not attached, placement could not be confirmed due to current cardiac rhythm.

## 2019-09-01 NOTE — Progress Notes (Signed)
Pharmacy Antibiotic Note  Patrick [name] is a 44 y.o. male admitted on 08/28/2019 with sepsis.  Pharmacy has been consulted for cefazolin dosing.  Pt presenting with weakness, right hip pain, found lying on ground by EMS covered in feces. PMH significant for HTN, EtOH abuse. Blood culture + for MSSA, pharmacy consulted to dose cefazolin.   Today, 09/01/19  D#4  cefazolin for MSSA bacteremia -WBC WNL -SCr 0.67 -Tmax 99.4 F - 2D echo neg for vegetations, ? TEE - repeat BCx ordered for today, BCx from 5/1 ngtd   Plan:  Continue Cefazolin 2 g IV q8h  Follow renal function and culture data  F/u ID plans/recs  Weight: 81.2 kg (179 lb 0.2 oz)  Temp (24hrs), Avg:98.7 F (37.1 C), Min:97.7 F (36.5 C), Max:99.4 F (37.4 C)  Recent Labs  Lab 08/28/19 1412 08/28/19 1412 08/28/19 1646 08/29/19 0430 08/29/19 0430 08/29/19 1002 08/29/19 1033 08/29/19 1326 08/30/19 0712 08/31/19 0542 08/31/19 2105 09/01/19 0331  WBC 8.4  --   --  5.7  --   --   --   --  3.8* 4.0  --  5.3  CREATININE 1.14   < >  --  0.86   < >  --  0.73  --  0.77 0.69 0.66 0.67  LATICACIDVEN >11.0*  --  9.7*  --   --  3.4*  --  2.8* 2.3*  --   --   --    < > = values in this interval not displayed.    CrCl cannot be calculated (Unknown ideal weight.).    Allergies  Allergen Reactions  . Mushroom Extract Complex Diarrhea and Other (See Comments)    Itchy throat  Antimicrobials this admission:  Cefepime, vanc, metronidazole x1 dose in ED 4/29 Cefazolin 4/30 >> Dose adjustments this admission:   Microbiology results:  4/29 BCx: 2/4 staph species, MSSA, no mecA detected 4/29 UCx:  neg FINAL 5/1 BC x2: ngtd 5/2 MRSA PCR: neg 5/3 BCx2:   Thank you for allowing pharmacy to be a part of this patient's care.   Herby Abraham, Pharm.D [phone] 09/01/2019 9:39 AM

## 2019-09-01 NOTE — Progress Notes (Signed)
[facility] Gastroenterology Progress Note  Patrick [name] 44 y.o. 1975/07/27  CC:  Anemia, melena   Subjective: Patient is tachypneic which limited his speaking and responses.  He denies any abdominal pain, nausea, vomiting, shortness of breath, chest pain.  Per RN, no stools today.  Per flowsheet, patient had 1 large, mushy black stool yesterday around 1230.  ROS : Review of Systems  Respiratory: Negative for cough and shortness of breath.   Cardiovascular: Negative for chest pain and palpitations.  Gastrointestinal: Negative for abdominal pain, blood in stool, constipation, diarrhea, heartburn, melena, nausea and vomiting.    Objective: Vital signs in last 24 hours: Vitals:   09/01/19 0410 09/01/19 0508  BP: 99/63 91/64  Pulse: 83 79  Resp: (!) 23 18  Temp:    SpO2: 97% 98%    Physical Exam:  General:  Lethargic, cooperative, appears stated age  Head:  Normocephalic, without obvious abnormality, atraumatic  Eyes:  Conjunctival pallor, EOMs intact  Lungs:   Clear to auscultation bilaterally, marked tachypnea  Heart:  Regular rate and rhythm, S1, S2 normal  Abdomen:   Soft, non-tender, bowel sounds active all four quadrants,  no masses, no peritoneal signs  Extremities: Extremities normal, atraumatic, no edema  Pulses: 2+ and symmetric    Lab Results: Recent Labs    08/31/19 0542 08/31/19 0542 08/31/19 2105 09/01/19 0331  NA 137   < > 137 134*  K 3.1*   < > 3.7 3.7  CL 108   < > 104 104  CO2 23   < > 23 25  GLUCOSE 103*   < > 110* 91  BUN 9   < > 7 8  CREATININE 0.69   < > 0.66 0.67  CALCIUM 7.3*   < > 7.8* 7.2*  MG 1.6*   < > 1.7 1.6*  PHOS 1.7*  --   --  3.3   < > = values in this interval not displayed.   Recent Labs    08/31/19 0542 09/01/19 0331  AST 182* 159*  ALT 56* 48*  ALKPHOS 72 69  BILITOT 3.4* 2.8*  PROT 4.9* 5.0*  ALBUMIN 2.2* 2.2*   Recent Labs    08/31/19 0542 08/31/19 1349 08/31/19 2105 09/01/19 0331  WBC 4.0  --   --  5.3  HGB  7.8*   < > 9.4* 7.6*  HCT 23.5*   < > 28.6* 23.5*  MCV 105.4*  --   --  108.8*  PLT 34*  --   --  47*   < > = values in this interval not displayed.   No results for input(s): LABPROT, INR in the last 72 hours.    Assessment: Anemia and melena: Hgb 7.6 today.  One large, melenic stool yesterday.  Suspect melena and drop in hemoglobin are due to mucosal bleeding from thrombocytopenia.  Patient's platelet count has improved to 47K and he has not had any bowel movements today.  Renal function remains within normal limits (BUN 8/Cr 0.67)  Thrombocytopenia: improving, platelets now at 47K (improved from 34K yesterday and 20K on 5/1)  Transaminitis: suspect this is due to heavy alcohol use.  LFTs trending down.  T bili 2.8/AST 159/ALT 48/alk phos 69 today.  Sepsis: MSSA bacteremia  Plan: Continue to monitor platelets and H&H with transfusion as needed to maintain Hgb >7.  Continue to monitor renal function and LFTs.  Continue Protonix IV BID.  At this time, we will reserve endoscopic evaluation for destabilizing bleeding.  [facility] will follow.  Patrick Slaughter PA-C 09/01/2019, 9:33 AM  Contact #  [phone]

## 2019-09-01 NOTE — Progress Notes (Signed)
[facility]    Date of Admission:  08/28/2019   Total days of antibiotics 5           ID: Patrick [name] is a 44 y.o. male with ETOH withdrawal, MSSA sepsis Principal Problem:   Severe sepsis with acute organ dysfunction due to methicillin susceptible Staphylococcus aureus (MSSA) (HCC) Active Problems:   Alcohol withdrawal (HCC)   MSSA bacteremia   Hypokalemia   Sepsis with acute liver failure without hepatic coma or septic shock (HCC)   Transaminitis    Subjective: Required to be transferred to icu over the weekend for alcohol withdrawal. He remains on precedex, sleeping, no tachycardia. Having melenic stools over the weekend.   cxr shows bilateral patchy infiltrates per my read Medications:  . calcium-vitamin D  2 tablet Oral TID  . Chlorhexidine Gluconate Cloth  6 each Topical Daily  . feeding supplement  1 Container Oral BID BM  . feeding supplement (PRO-STAT SUGAR FREE 64)  30 mL Oral BID  . folic acid  1 mg Oral Daily  . lisinopril  5 mg Oral Daily  . mouth rinse  15 mL Mouth Rinse BID  . multivitamin with minerals  1 tablet Oral Daily  . nicotine  21 mg Transdermal Daily  . pantoprazole (PROTONIX) IV  40 mg Intravenous Q12H  . [START ON 09/04/2019] thiamine  100 mg Oral Daily    Objective: Vital signs in last 24 hours: Temp:  [97.6 F (36.4 C)-99.2 F (37.3 C)] 97.6 F (36.4 C) (05/03 1727) Pulse Rate:  [79-123] 85 (05/03 0800) Resp:  [18-49] 25 (05/03 0800) BP: (79-168)/(55-130) 99/78 (05/03 0800) SpO2:  [89 %-100 %] 98 % (05/03 0508) Weight:  [81.2 kg] 81.2 kg (05/03 0500) Physical Exam  Constitutional: He is oriented to person. He appears well-developed and well-nourished. No distress.  HENT:  Mouth/Throat: Oropharynx is clear and moist. No oropharyngeal exudate.  Cardiovascular: Normal rate, regular rhythm and normal heart sounds. Exam reveals no gallop and no friction rub.  No murmur heard.  Pulmonary/Chest: Effort normal and  breath sounds normal. No respiratory distress. He has no wheezes.  Abdominal: Soft. Bowel sounds are normal. He exhibits no distension. There is no tenderness.  Neurological: He is alert and oriented to person, moves all extremities Skin: Skin is warm and dry. No rash noted. No erythema.  Psychiatric: He has a normal mood and affect. His behavior is normal.     Lab Results Recent Labs    08/31/19 0542 08/31/19 1349 08/31/19 2105 09/01/19 0331  WBC 4.0  --   --  5.3  HGB 7.8*   < > 9.4* 7.6*  HCT 23.5*   < > 28.6* 23.5*  NA 137  --  137 134*  K 3.1*  --  3.7 3.7  CL 108  --  104 104  CO2 23  --  23 25  BUN 9  --  7 8  CREATININE 0.69  --  0.66 0.67   < > = values in this interval not displayed.   Liver Panel Recent Labs    08/31/19 0542 09/01/19 0331  PROT 4.9* 5.0*  ALBUMIN 2.2* 2.2*  AST 182* 159*  ALT 56* 48*  ALKPHOS 72 69  BILITOT 3.4* 2.8*    Microbiology: Reviewed/ 5/3 blood cx ngtd, 5/1 blood cx ngtd Studies/Results: DG CHEST PORT 1 VIEW  Result Date: 08/31/2019 CLINICAL DATA:  Melanotic stools.  Shortness of breath. EXAM: PORTABLE CHEST 1 VIEW COMPARISON:  08/28/2019 FINDINGS: There are new bilateral hazy airspace opacities, most evident in the left mid lung zone. There is no pneumothorax. There may be small bilateral pleural effusions. The heart size is essentially stable from prior study. There is no evidence for an acute displaced fracture. There is an old fracture of the left clavicle. IMPRESSION: Interval development of bilateral airspace opacities, greatest on the left. Findings are concerning for multifocal pneumonia (viral or bacterial). Electronically Signed   By: Constance Holster M.D.   On: 08/31/2019 18:23     Assessment/Plan: Sepsis due to MSSA bacteremia = concern for endocarditis with possible pulmonary septic emboli. Once more stable, recommend TEE. Continue on cefazolin 2gm IV Q 8hr. Unclear source of MSSA thus once he is stable, further work  up is needed.  Melenic stools, in history of heavy drinking = defer to GI to timing of EGD. Currently on iv protonix due to presumed gastritis. Not having further brisk bleed for emergent EGD  Alcohol withdrawal = per PCCM management  transaminitis = improving. 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 Has synthetic dysfunction with elevated INR  [facility] Cell: [phone] Pager: [phone]  09/01/2019, 5:35 PM

## 2019-09-01 NOTE — Progress Notes (Addendum)
    BRIEF OVERNIGHT PROGRESS REPORT  SUBJECTIVE: Rapid response initiated for severe Etoh withdrawal symptoms. Per RN, CIWA score>20 despite administration of Ativan with associated symptom of agitation, restlessness, combativeness, tachycardia, tachypnea and hypertension.  OBJECTIVE: He was afebrile with blood pressure 168/130 mm Hg and pulse rate 123 beats/min. There were no reported  focal neurological deficits; he was alert but disoriented , agitated and restless.  BRIEF PATIENT DESCRIPTION: 44 y.o with PMH significant for EtOH abuse admitted on 4/29 with traumatic fall secondary to alcohol intoxication and possible seizure like activity. Found to septic secondary to MSSA bacteremia, POA, severe electrolyte imbalance, thrombocytopenia, transaminitis/+Hep C and GI bleed.  ASSESSMENT/PLAN: EtOH Withdrawal  ?Hx Seizures / DTs  - Transfer to step down unit  - Start Precedex gtt and wean as symptoms tolerate - Check CMP, ABGs, Daily BMP+Mg - Monitor electrolytes - Daily Thiamine, Folate, MVI once tolerating PO - SW consult for cessation resources - PT/OT evaluation for mobility - CIWA +/- Standing Protocol - Seizure precautions - Ativan prn seizure activity     Webb Silversmith, DNP, CCRN, APRN, FNP-BC [facility] Nurse Practitioner

## 2019-09-01 NOTE — Progress Notes (Signed)
PROGRESS NOTE    Patrick [name]  GYF:[id number] DOB: 11/15/1975 DOA: 08/28/2019 PCP: Patient, No Pcp Per    Chief Complaint  Patient presents with  . Alcohol Intoxication  . Fatigue  . Nausea  . Hematemesis    Brief Narrative: HPI per Dr. Sherolyn Buba is a 44 y.o. male with medical history significant of hypertension, eczema, migraine headaches, alcohol abuse, nicotine use.  Patient does have a history of heavy smoking, does drink heavily.  Patient drinks 6-10 beers and also 6 shots of hard liquor a day.  Today he was trying to stand up sit in a chair when he slipped and he fell and hurt his right hip.  Patient lay on the side for some time EMS was called and got him to the ER.  As per EMS the patient was covered with feces.  Patient states that his last drink of alcohol was today around noontime.  Did have nausea and vomitings.  The patient is a very poor historian.  Getting history from him is very difficult.  As per the ER notes there was mention of some bloody vomiting.  Patient was found to have some blood around his mouth and also did bite his tongue.  Not sure whether he had any seizure. Patient has abnormal blood work and PCCM was consulted and was evaluated by Dr Lake Bells who thought the patient could be admitted to the hospitalist service.  Patient was treated with IV fluids in the ER.  Patient complains of generalized body aches.  Patient lives at home by himself, uses a walker to move around.  Patient had a temperature of 101.2 Fahrenheit, tachycardic, lactic acid greater than 11, concern for aspiration pneumonia due to his vomitings, patient was treated with broad-spectrum antibiotics in the ER.  His LFTs were elevated.  Platelets were low at 17,000.  Patient was also found to have significant hypokalemia and hypomagnesemia and electrolytes are being replaced.  ED Course: Patient was treated with IV fluids, potassium and magnesium were replaced in the ER, his lactic acid level has  come down.  CT of the abdomen and pelvis with contrast done in the ER showed marked diffuse hepatic steatosis, bilateral nonobstructing nephrolithiasis, chronic superior endplate compression deformity of L2 with mild bony retropulsion.  Assessment & Plan:   Principal Problem:   Severe sepsis with acute organ dysfunction due to methicillin susceptible Staphylococcus aureus (MSSA) (HCC) Active Problems:   MSSA bacteremia   Alcohol withdrawal (HCC)   Hypokalemia   Sepsis with acute liver failure without hepatic coma or septic shock (HCC)   Transaminitis  1 sepsis secondary to MSSA bacteremia, POA Patient met criteria for sepsis on admission with tachycardia, lactic acidosis, fever of 101.  Blood cultures positive for MSSA.  Urinalysis nitrite negative, leukocytes negative.  Chest x-ray negative for any acute infiltrate.  2D echo with a EF of 55 to 60%, no wall motion abnormalities, grade 1 diastolic dysfunction.  No vegetations noted.  May need a TEE however will defer to ID.  Repeat blood cultures today.  Continue IV Ancef.  ID following and appreciate input and recommendations.   2.Severehypokalemia/hypomagnesemia/hypocalcemia/hypophosphatemia Likely secondary to alcohol abuse.  Potassium at 3.7 this morning.  Phosphorus was at 3.3, magnesium  at 1.6.  We will give potassium 40 mEq p.o. x1 as patient also to receive IV Lasix.  Give K. Dur 10 mEq IV every 1 hour x4 runs.  Vitamin D 25 hydroxy significantly depressed at 7.88. PTH pending.  Continue  on Os-Cal with vitamin D 3 times daily.  Follow.  3.  Anemia of chronic disease/severe thrombocytopenia Likely secondary to bone marrow suppression from ongoing alcohol abuse and MSSA bacteremia with underlying liver disease and concern for hepatitis.  Anemia panel consistent with anemia of chronic disease. Patient with melanotic stools per RN on 08/31/2019.  FOBT was positive.  This morning.  Platelet count at 47 from 34 from 20 from 11 from 17 on  admission.  Last platelet count noted was 124 on 04/10/2018.  Decreasing platelets likely a dilutional component as well.  Transfusion threshold hemoglobin < 7.  Transfuse for platelets < 10 or acute bleed.  Due to severe thrombocytopenia hematology consulted and are following.  Continue IV PPI twice daily GI consulted and are following.  4.  Transaminitis/positive hepatitis C antibody Likely secondary to alcohol abuse.  CT abdomen and pelvis with diffuse hepatic steatosis.  Acute hepatitis panel positive hep C viral antibody.  Hepatitis C viral load pending. ID following.   5.  Alcohol abuse Patient with significant alcohol abuse.  Patient initially placed on the Ativan withdrawal protocol as well as the Librium detox protocol.  Patient however with continued agitation and aggression and was refusing oral intake and is-Librium detox protocol discontinued.  Patient was on the Ativan withdrawal protocol however due to increased aggression and after receiving 8 mg of IV Ativan due to no significant improvement patient was transferred to the stepdown unit overnight and started on a Precedex drip.  Currently on a Precedex drip.  Patient started on thiamine 500 mg IV 3 times daily x3 days and then will subsequently go down to thiamine 100 mg daily.  Continue multivitamin, folic acid.  Supportive care.  Consult with critical care medicine as patient on Precedex drip.   6.  Dehydration Saline lock IV fluids.  7.??  Seizure like activity Concern likely secondary to alcohol abuse.  Patient noted on admission to have a tongue bite.  Head CT negative for any acute abnormalities.  EEG with no seizures or epileptiform discharges seen throughout the recording.  Patient with signs of alcohol withdrawal and aggression.    8.??  GI bleed, POA Per admitting physician ED physician noted patient had some bloody vomitus however no bloody vomitus noted at time of admission.  Hemoglobin trending down felt to be possibly  dilutional.  Per RN patient with melanotic stools the morning of 08/31/2019.  Repeat hemoglobin was 8.3 with subsequent repeat last night at 9.4.  H&H this morning is 7.6.  Continue IV PPI twice daily.  GI following and appreciate input and recommendations.  9.  Probable volume overload Patient with decreased breath sounds in the bases and some coarse breath sounds/crackles.  Patient noted to have been  4L positive during this hospitalization.  Chest x-ray done with interval development of bilateral airspace opacities left greater than right with findings concerning for multifocal pneumonia.  Patient however afebrile, normal white count.  Chest x-ray findings likely volume overload.  Patient given a dose of Lasix 40 mg IV x1(08/31/2019) with a urine output of 3.450 L.  BNP noted at 459.7.  We will give another dose of Lasix 20 mg IV x1.  Repeat chest x-ray in the morning.  Patient already on IV Ancef for MSSA bacteremia.  Strict I's and O's.  Daily weights.  Follow.   DVT prophylaxis: SCDs/patient with severe thrombocytopenia Code Status: Full Family Communication: No family at bedside. Disposition:   Status is: Inpatient    Dispo:  The patient is from: Home              Anticipated d/c is to: Likely home with home health versus SNF              Anticipated d/c date is: To be determined.  Pending work-up of bacteremia.              Patient currently with MSSA bacteremia, on IV antibiotics, and stepdown unit on Precedex drip, ID and hematology following.  Patient with severe thrombocytopenia. Patient also noted to have melanotic stools (08/31/2019) with decreasing hemoglobin.        Consultants:   ID: Dr. Baxter Flattery 08/29/2019  Hematology/oncology Dr. Alvy Bimler 08/29/2019  Gastroenterology: Dr. Cristina Gong 08/31/2019  Procedures:  2D echo 08/29/2019  CT head 08/29/2019  CT abdomen and pelvis 08/28/2019  Chest x-ray 08/28/2019  EEG 08/29/2019  Antimicrobials:   IV Ancef  08/29/2019   Subjective: Events overnight noted.  Patient transferred to stepdown unit due to increased agitation despite 7-8 mg of Ativan per RN note.  Patient placed on a Precedex drip.  Patient is sleeping and snoring but arousable.  Patient asking for something to drink.  Less agitated per RN.  Patient denies any chest pain or shortness of breath.    Objective: Vitals:   09/01/19 0400 09/01/19 0410 09/01/19 0500 09/01/19 0508  BP: (!) 79/55 99/63  91/64  Pulse: 84 83  79  Resp: 20 (!) 23  18  Temp:      TempSrc:      SpO2: 98% 97%  98%  Weight:   81.2 kg     Intake/Output Summary (Last 24 hours) at 09/01/2019 0844 Last data filed at 09/01/2019 0523 Gross per 24 hour  Intake 688.31 ml  Output 3450 ml  Net -2761.69 ml   Filed Weights   08/29/19 1800 08/31/19 0600 09/01/19 0500  Weight: 86.2 kg 85.6 kg 81.2 kg    Examination:  General exam: Alert.  Dry mucous membranes. HEENT: Pupils equal round and reactive to light and accommodation.  Oropharynx with a purplish tongue with bite marks noted on the lateral tongue. Respiratory system: Decreased breath sounds in the bases.  Some diffuse coarse breath sounds/crackles.  Normal respiratory effort. Cardiovascular system: Regular rate rhythm no murmurs rubs or gallops.  No JVD.  No lower extremity edema. Gastrointestinal system: Abdomen is soft, nontender, nondistended, positive bowel sounds.  No rebound.  No guarding.  Central nervous system: Moving extremities spontaneously. Extremities: Symmetric 5 x 5 power. Skin: Multiple areas of bruising noted.   Psychiatry: Judgement and insight appear poor to fair. Mood & affect appropriate.     Data Reviewed: I have personally reviewed following labs and imaging studies  CBC: Recent Labs  Lab 08/28/19 1412 08/28/19 1412 08/29/19 0430 08/29/19 0430 08/30/19 0712 08/31/19 0542 08/31/19 1349 08/31/19 2105 09/01/19 0331  WBC 8.4  --  5.7  --  3.8* 4.0  --   --  5.3  NEUTROABS 6.1   --   --   --   --   --   --   --   --   HGB 11.9*   < > 9.3*   < > 8.7* 7.8* 8.3* 9.4* 7.6*  HCT 33.4*   < > 26.6*   < > 26.2* 23.5* 25.4* 28.6* 23.5*  MCV 99.1  --  101.1*  --  106.9* 105.4*  --   --  108.8*  PLT 17*  --  11*  --  20* 34*  --   --  47*   < > = values in this interval not displayed.    Basic Metabolic Panel: Recent Labs  Lab 08/29/19 0430 08/29/19 0430 08/29/19 1033 08/30/19 0712 08/31/19 0542 08/31/19 2105 09/01/19 0331  NA 129*   < > 129* 133* 137 137 134*  K <2.0*   < > 2.2* 2.8* 3.1* 3.7 3.7  CL 89*   < > 93* 100 108 104 104  CO2 27   < > _0 GLUCOSE 134*   < > 167* 104* 103* 110* 91  BUN 13   < > _1 CREATININE 0.86   < > 0.73 0.77 0.69 0.66 0.67  CALCIUM 7.0*   < > 7.1* 7.3* 7.3* 7.8* 7.2*  MG 3.3*  --   --  1.8 1.6* 1.7 1.6*  PHOS 1.3*  --   --  1.2* 1.7*  --  3.3   < > = values in this interval not displayed.    GFR: CrCl cannot be calculated (Unknown ideal weight.).  Liver Function Tests: Recent Labs  Lab 08/29/19 0430 08/29/19 1033 08/30/19 0712 08/31/19 0542 09/01/19 0331  AST 182* 190* 217* 182* 159*  ALT 84* 85* 73* 56* 48*  ALKPHOS 70 69 71 72 69  BILITOT 5.6* 6.3* 4.7* 3.4* 2.8*  PROT 5.5* 5.6* 5.2* 4.9* 5.0*  ALBUMIN 2.6* 2.5* 2.5* 2.2* 2.2*    CBG: Recent Labs  Lab 08/29/19 1008 08/30/19 0800 08/31/19 0814  GLUCAP 169* 98 118*     Recent Results (from the past 240 hour(s))  Blood Culture (routine x 2)     Status: Abnormal   Collection Time: 08/28/19  2:12 PM   Specimen: BLOOD  Result Value Ref Range Status   Specimen Description   Final    BLOOD LEFT ANTECUBITAL Performed at [facility], [place] [address], [place], [place] [address]    Special Requests   Final    BOTTLES DRAWN AEROBIC AND ANAEROBIC Blood Culture adequate volume Performed at [facility] [address] [place], [place] [address]    Culture  Setup Time   Final    GRAM POSITIVE COCCI IN BOTH AEROBIC  AND ANAEROBIC BOTTLES CRITICAL RESULT CALLED TO, READ BACK BY AND VERIFIED WITH: E. JACKSON, PHARMD (WL) AT 3646 ON 08/29/19 BY C. JESSUP, MT. Performed at [facility], [place] [address], [place], [place] [address]    Culture STAPHYLOCOCCUS AUREUS (A)  Final   Report Status 08/31/2019 FINAL  Final   Organism ID, Bacteria STAPHYLOCOCCUS AUREUS  Final      Susceptibility   Staphylococcus aureus - MIC*    CIPROFLOXACIN 2 INTERMEDIATE Intermediate     ERYTHROMYCIN <=0.25 SENSITIVE Sensitive     GENTAMICIN <=0.5 SENSITIVE Sensitive     OXACILLIN 0.5 SENSITIVE Sensitive     TETRACYCLINE <=1 SENSITIVE Sensitive     VANCOMYCIN <=0.5 SENSITIVE Sensitive     TRIMETH/SULFA <=10 SENSITIVE Sensitive     CLINDAMYCIN <=0.25 SENSITIVE Sensitive     RIFAMPIN <=0.5 SENSITIVE Sensitive     Inducible Clindamycin NEGATIVE Sensitive     * STAPHYLOCOCCUS AUREUS  Blood Culture ID Panel (Reflexed)     Status: Abnormal   Collection Time: 08/28/19  2:12 PM  Result Value Ref Range Status   Enterococcus species NOT DETECTED NOT DETECTED Final   Listeria monocytogenes NOT DETECTED NOT DETECTED Final   Staphylococcus species DETECTED (  A) NOT DETECTED Final    Comment: CRITICAL RESULT CALLED TO, READ BACK BY AND VERIFIED WITH: E. JACKSON, PHARMD (WL) AT 0932 ON 08/29/19 BY C. JESSUP, MT.    Staphylococcus aureus (BCID) DETECTED (A) NOT DETECTED Final    Comment: Methicillin (oxacillin) susceptible Staphylococcus aureus (MSSA). Preferred therapy is anti staphylococcal beta lactam antibiotic (Cefazolin or Nafcillin), unless clinically contraindicated. CRITICAL RESULT CALLED TO, READ BACK BY AND VERIFIED WITH: E. JACKSON, PHARMD (WL) AT 3557 ON 08/29/19 BY C. JESSUP, MT.    Methicillin resistance NOT DETECTED NOT DETECTED Final   Streptococcus species NOT DETECTED NOT DETECTED Final   Streptococcus agalactiae NOT DETECTED NOT DETECTED Final   Streptococcus pneumoniae NOT DETECTED NOT DETECTED Final    Streptococcus pyogenes NOT DETECTED NOT DETECTED Final   Acinetobacter baumannii NOT DETECTED NOT DETECTED Final   Enterobacteriaceae species NOT DETECTED NOT DETECTED Final   Enterobacter cloacae complex NOT DETECTED NOT DETECTED Final   Escherichia coli NOT DETECTED NOT DETECTED Final   Klebsiella oxytoca NOT DETECTED NOT DETECTED Final   Klebsiella pneumoniae NOT DETECTED NOT DETECTED Final   Proteus species NOT DETECTED NOT DETECTED Final   Serratia marcescens NOT DETECTED NOT DETECTED Final   Haemophilus influenzae NOT DETECTED NOT DETECTED Final   Neisseria meningitidis NOT DETECTED NOT DETECTED Final   Pseudomonas aeruginosa NOT DETECTED NOT DETECTED Final   Candida albicans NOT DETECTED NOT DETECTED Final   Candida glabrata NOT DETECTED NOT DETECTED Final   Candida krusei NOT DETECTED NOT DETECTED Final   Candida parapsilosis NOT DETECTED NOT DETECTED Final   Candida tropicalis NOT DETECTED NOT DETECTED Final    Comment: Performed at [facility], [address], [place], [place] [address]  Blood Culture (routine x 2)     Status: Abnormal   Collection Time: 08/28/19  2:27 PM   Specimen: BLOOD  Result Value Ref Range Status   Specimen Description   Final    BLOOD RIGHT ANTECUBITAL Performed at [facility] [address] [place], [place] [address]    Special Requests   Final    BOTTLES DRAWN AEROBIC AND ANAEROBIC Blood Culture adequate volume Performed at [facility] [address], [place], [place] [address]    Culture  Setup Time   Final    GRAM POSITIVE COCCI IN CLUSTERS ANAEROBIC BOTTLE ONLY CRITICAL VALUE NOTED.  VALUE IS CONSISTENT WITH PREVIOUSLY REPORTED AND CALLED VALUE.    Culture (A)  Final    STAPHYLOCOCCUS AUREUS SUSCEPTIBILITIES PERFORMED ON PREVIOUS CULTURE WITHIN THE LAST 5 DAYS. Performed at [facility], [place] [address], [place], [place] [address]    Report Status 08/31/2019 FINAL  Final   Respiratory Panel by RT PCR (Flu A&B, Covid) - Nasopharyngeal Swab     Status: None   Collection Time: 08/28/19  3:30 PM   Specimen: Nasopharyngeal Swab  Result Value Ref Range Status   SARS Coronavirus 2 by RT PCR NEGATIVE NEGATIVE Final    Comment: (NOTE) SARS-CoV-2 target nucleic acids are NOT DETECTED. The SARS-CoV-2 RNA is generally detectable in upper respiratoy specimens during the acute phase of infection. The lowest concentration of SARS-CoV-2 viral copies this assay can detect is 131 copies/mL. A negative result does not preclude SARS-Cov-2 infection and should not be used as the sole basis for treatment or other patient management decisions. A negative result may occur with  improper specimen collection/handling, submission of specimen other than nasopharyngeal swab, presence of viral mutation(s) within  the areas targeted by this assay, and inadequate number of viral copies (<131 copies/mL). A negative result must be combined with clinical observations, patient history, and epidemiological information. The expected result is Negative. Fact Sheet for Patients:  PinkCheek.be Fact Sheet for Healthcare Providers:  GravelBags.it This test is not yet ap proved or cleared by the [place] FDA and  has been authorized for detection and/or diagnosis of SARS-CoV-2 by FDA under an Emergency Use Authorization (EUA). This EUA will remain  in effect (meaning this test can be used) for the duration of the COVID-19 declaration under Section 564(b)(1) of the Act, 21 U.S.C. section 360bbb-3(b)(1), unless the authorization is terminated or revoked sooner.    Influenza A by PCR NEGATIVE NEGATIVE Final   Influenza B by PCR NEGATIVE NEGATIVE Final    Comment: (NOTE) The Xpert Xpress SARS-CoV-2/FLU/RSV assay is intended as an aid in  the diagnosis of influenza from Nasopharyngeal swab specimens and  should not be used as a sole  basis for treatment. Nasal washings and  aspirates are unacceptable for Xpert Xpress SARS-CoV-2/FLU/RSV  testing. Fact Sheet for Patients: PinkCheek.be Fact Sheet for Healthcare Providers: GravelBags.it This test is not yet approved or cleared by the [place] FDA and  has been authorized for detection and/or diagnosis of SARS-CoV-2 by  FDA under an Emergency Use Authorization (EUA). This EUA will remain  in effect (meaning this test can be used) for the duration of the  Covid-19 declaration under Section 564(b)(1) of the Act, 21  U.S.C. section 360bbb-3(b)(1), unless the authorization is  terminated or revoked. Performed at [facility], [place] [address], [place], [place] [address]   Urine culture     Status: None   Collection Time: 08/28/19  4:26 PM   Specimen: In/Out Cath Urine  Result Value Ref Range Status   Specimen Description   Final    IN/OUT CATH URINE Performed at [facility] [address] [place], [place] [address]    Special Requests   Final    NONE Performed at [facility], [place] [address], [place], [place] [address]    Culture   Final    NO GROWTH Performed at [facility], [place] [address] [place], [place] [address]    Report Status 08/29/2019 FINAL  Final  Culture, blood (Routine X 2) w Reflex to ID Panel     Status: None (Preliminary result)   Collection Time: 08/30/19  1:16 PM   Specimen: Right Antecubital; Blood  Result Value Ref Range Status   Specimen Description   Final    RIGHT ANTECUBITAL Performed at [facility] [address], [place], [place] [address]    Special Requests   Final    BOTTLES DRAWN AEROBIC AND ANAEROBIC Blood Culture adequate volume Performed at [facility] [address] [place], [place] [address]    Culture   Final    NO GROWTH 2 DAYS Performed at [facility] [address], [place], [place] [address]    Report Status PENDING  Incomplete  Culture, blood (Routine X 2) w Reflex to ID Panel     Status: None (Preliminary result)   Collection Time: 08/30/19  1:16 PM   Specimen: BLOOD RIGHT HAND  Result Value Ref Range Status   Specimen Description   Final    BLOOD RIGHT HAND Performed at [facility] [address] [place], [place] [address]  Special Requests   Final    BOTTLES DRAWN AEROBIC ONLY Blood Culture adequate volume Performed at [facility] [address], [place], [place] [address]    Culture   Final    NO GROWTH 2 DAYS Performed at [facility] [address], [place], [place] [address]    Report Status PENDING  Incomplete  MRSA PCR Screening     Status: None   Collection Time: 08/31/19  8:17 PM   Specimen: Nasal Mucosa; Nasopharyngeal  Result Value Ref Range Status   MRSA by PCR NEGATIVE NEGATIVE Final    Comment:        The GeneXpert MRSA Assay (FDA approved for NASAL specimens only), is one component of a comprehensive MRSA colonization surveillance program. It is not intended to diagnose MRSA infection nor to guide or monitor treatment for MRSA infections. Performed at [facility], [place] [address] [place], [place] [address]          Radiology Studies: DG CHEST PORT 1 VIEW  Result Date: 08/31/2019 CLINICAL DATA:  Melanotic stools.  Shortness of breath. EXAM: PORTABLE CHEST 1 VIEW COMPARISON:  08/28/2019 FINDINGS: There are new bilateral hazy airspace opacities, most evident in the left mid lung zone. There is no pneumothorax. There may be small bilateral pleural effusions. The heart size is essentially stable from prior study. There is no evidence for an acute displaced fracture. There is an old fracture of the left clavicle. IMPRESSION: Interval development of bilateral airspace opacities, greatest on the left. Findings are concerning  for multifocal pneumonia (viral or bacterial). Electronically Signed   By: Constance Holster M.D.   On: 08/31/2019 18:23        Scheduled Meds: . calcium-vitamin D  2 tablet Oral TID  . Chlorhexidine Gluconate Cloth  6 each Topical Daily  . feeding supplement  1 Container Oral BID BM  . feeding supplement (PRO-STAT SUGAR FREE 64)  30 mL Oral BID  . folic acid  1 mg Oral Daily  . folic acid  1 mg Intravenous Daily  . lisinopril  5 mg Oral Daily  . mouth rinse  15 mL Mouth Rinse BID  . multivitamin with minerals  1 tablet Oral Daily  . nicotine  21 mg Transdermal Daily  . pantoprazole (PROTONIX) IV  40 mg Intravenous Q12H   Continuous Infusions: .  ceFAZolin (ANCEF) IV 2 g (09/01/19 0659)  . dexmedetomidine (PRECEDEX) IV infusion 0.1 mcg/kg/hr (09/01/19 0700)  . magnesium sulfate bolus IVPB    . thiamine injection       LOS: 4 days    Time spent: 40 minutes    Irine Seal, MD [facility]   To contact the attending provider between 7A-7P or the covering provider during after hours 7P-7A, please log into the web site [email or web address] and access using universal [place] password for that web site. If you do not have the password, please call the hospital operator.  09/01/2019, 8:44 AM

## 2019-09-02 ENCOUNTER — Inpatient Hospital Stay (HOSPITAL_COMMUNITY): Payer: Self-pay

## 2019-09-02 LAB — COMPREHENSIVE METABOLIC PANEL
ALT: 40 U/L (ref 0–44)
AST: 155 U/L — ABNORMAL HIGH (ref 15–41)
Albumin: 2.2 g/dL — ABNORMAL LOW (ref 3.5–5.0)
Alkaline Phosphatase: 74 U/L (ref 38–126)
Anion gap: 8 (ref 5–15)
BUN: 7 mg/dL (ref 6–20)
CO2: 22 mmol/L (ref 22–32)
Calcium: 7.6 mg/dL — ABNORMAL LOW (ref 8.9–10.3)
Chloride: 104 mmol/L (ref 98–111)
Creatinine, Ser: 0.72 mg/dL (ref 0.61–1.24)
GFR calc Af Amer: >60 mL/min (ref >60)
GFR calc non Af Amer: >60 mL/min (ref >60)
Glucose, Bld: 94 mg/dL (ref 70–99)
Potassium: 3.8 mmol/L (ref 3.5–5.1)
Sodium: 134 mmol/L — ABNORMAL LOW (ref 135–145)
Total Bilirubin: 2.6 mg/dL — ABNORMAL HIGH (ref 0.3–1.2)
Total Protein: 4.9 g/dL — ABNORMAL LOW (ref 6.5–8.1)

## 2019-09-02 LAB — GLUCOSE, CAPILLARY: Glucose-Capillary: 158 mg/dL — ABNORMAL HIGH (ref 70–99)

## 2019-09-02 LAB — CBC
HCT: 22.9 % — ABNORMAL LOW (ref 39.0–52.0)
Hemoglobin: 7.2 g/dL — ABNORMAL LOW (ref 13.0–17.0)
MCH: 35.1 pg — ABNORMAL HIGH (ref 26.0–34.0)
MCHC: 31.4 g/dL (ref 30.0–36.0)
MCV: 111.7 fL — ABNORMAL HIGH (ref 80.0–100.0)
Platelets: 84 10*3/uL — ABNORMAL LOW (ref 150–400)
RBC: 2.05 MIL/uL — ABNORMAL LOW (ref 4.22–5.81)
RDW: 15.6 % — ABNORMAL HIGH (ref 11.5–15.5)
WBC: 6.4 10*3/uL (ref 4.0–10.5)
nRBC: 1.6 % — ABNORMAL HIGH (ref 0.0–0.2)

## 2019-09-02 LAB — MAGNESIUM: Magnesium: 1.7 mg/dL (ref 1.7–2.4)

## 2019-09-02 MED ORDER — MAGNESIUM SULFATE 2 GM/50ML IV SOLN
2.0000 g | Freq: Once | INTRAVENOUS | Status: AC
  Administered 2019-09-02: 06:00:00 2 g via INTRAVENOUS
  Filled 2019-09-02: qty 50

## 2019-09-02 MED ORDER — FENTANYL CITRATE (PF) 100 MCG/2ML IJ SOLN
25.0000 ug | Freq: Once | INTRAMUSCULAR | Status: AC
  Administered 2019-09-02: 25 ug via INTRAVENOUS
  Filled 2019-09-02: qty 2

## 2019-09-02 MED ORDER — CHLORDIAZEPOXIDE HCL 25 MG PO CAPS
25.0000 mg | ORAL_CAPSULE | Freq: Four times a day (QID) | ORAL | Status: AC
  Administered 2019-09-02 (×4): 25 mg via ORAL
  Filled 2019-09-02 (×4): qty 1

## 2019-09-02 MED ORDER — LOPERAMIDE HCL 2 MG PO CAPS: 2.0000 mg | ORAL_CAPSULE | ORAL | Status: DC | PRN

## 2019-09-02 MED ORDER — CHLORDIAZEPOXIDE HCL 25 MG PO CAPS
25.0000 mg | ORAL_CAPSULE | Freq: Three times a day (TID) | ORAL | Status: DC
  Administered 2019-09-03: 10:00:00 25 mg via ORAL
  Filled 2019-09-02: qty 1

## 2019-09-02 MED ORDER — MAGNESIUM SULFATE 2 GM/50ML IV SOLN
2.0000 g | Freq: Once | INTRAVENOUS | Status: AC
  Administered 2019-09-02: 11:00:00 2 g via INTRAVENOUS
  Filled 2019-09-02: qty 50

## 2019-09-02 MED ORDER — HEPATITIS A VACCINE 1440 EL U/ML IM SUSP
1.0000 mL | Freq: Once | INTRAMUSCULAR | Status: DC
  Filled 2019-09-02: qty 1

## 2019-09-02 MED ORDER — HEPATITIS B VAC RECOMBINANT 10 MCG/ML IJ SUSP
1.0000 mL | Freq: Once | INTRAMUSCULAR | Status: AC
  Administered 2019-09-02: 15:00:00 10 ug via INTRAMUSCULAR
  Filled 2019-09-02: qty 1

## 2019-09-02 MED ORDER — TRAMADOL HCL 50 MG PO TABS
50.0000 mg | ORAL_TABLET | Freq: Two times a day (BID) | ORAL | Status: DC | PRN
  Administered 2019-09-02 – 2019-09-04 (×3): 50 mg via ORAL
  Filled 2019-09-02 (×3): qty 1

## 2019-09-02 MED ORDER — CHLORDIAZEPOXIDE HCL 25 MG PO CAPS: 25.0000 mg | ORAL_CAPSULE | Freq: Four times a day (QID) | ORAL | Status: DC | PRN

## 2019-09-02 MED ORDER — MAGNESIUM SULFATE 4 GM/100ML IV SOLN: 4.0000 g | Freq: Once | INTRAVENOUS | Status: DC

## 2019-09-02 MED ORDER — CHLORDIAZEPOXIDE HCL 25 MG PO CAPS: 25.0000 mg | ORAL_CAPSULE | Freq: Every day | ORAL | Status: DC

## 2019-09-02 MED ORDER — CHLORDIAZEPOXIDE HCL 25 MG PO CAPS: 25.0000 mg | ORAL_CAPSULE | ORAL | Status: DC

## 2019-09-02 NOTE — Progress Notes (Signed)
NAME:  Patrick [name], MRN:  [id number], DOB:  14-Mar-1976, LOS: 5 ADMISSION DATE:  08/28/2019, CONSULTATION DATE:  4/29 REFERRING MD:  Laveda Norman, CHIEF COMPLAINT:  Fall, hip pain   Brief History   44 year old male with a past medical history significant for heavy alcohol abuse came to the [facility] long emergency room complaining of hip pain, nausea after a fall recently.  Has been drinking heavily and has had poor p.o. intake. PCCM initially consulted for markedly elevated lactic acid. This resolved w/ IFVs.   Hospital course from 4/29-->5/3 4/29 admitted with febrile illness, cultures obtained, broad-spectrum antibiotics initiated, fluid volume resuscitation initiated, CIWA protocol started, folate administered. 4/30: Blood cultures were positive for MSSA antibiotics narrowed to IV Ancef, infectious disease consulted.  Platelet count 17,000,Felt secondary to bone marrow suppression from EtOH, as well as sepsis in the setting of underlying liver dysfunction 5/2 GI consulted for melanic stool and anemia, worsening signs of alcohol withdrawal, becoming aggressive towards staff, not able to take Librium detox, spitting them out at staff.  Rapid response called, moved to intensive care for Librium 5/3: Critical care reconsulted as patient will requiring Precedex infusion Past Medical History  Migraine headaches Hypertension eczema  Significant Hospital Events   4/29 admitted 4/29 admitted with febrile illness, cultures obtained, broad-spectrum antibiotics initiated, fluid volume resuscitation initiated, CIWA protocol started, folate administered. 4/30: Blood cultures were positive for MSSA antibiotics narrowed to IV Ancef, infectious disease consulted.  Platelet count 17,000,Felt secondary to bone marrow suppression from EtOH, as well as sepsis in the setting of underlying liver dysfunction 5/2 GI consulted for melanic stool and anemia, worsening signs of alcohol withdrawal, becoming aggressive towards  staff, not able to take Librium detox, spitting them out at staff.  Rapid response called, moved to intensive care for Librium 5/3: Critical care reconsulted as patient will requiring Precedex infusion  Consults:  PCCM, reconsulted on 5/3 for delirium GI consulted for possible upper GI bleed Infectious disease consulted for MSSA bacteremia Hematology consulted for thrombocytopenia  Procedures:    Significant Diagnostic Tests:  4/29 CT abdomen pelvis: No abdominopelvic findings, marked diffuse hepatic steatosis, bilateral nonobstructive nephrolithiasis, chronic superior endplate compression deformity of L2, aortic atherosclerosis 4/30 echocardiogram:EF 55 to 60% with normal LV function, grade 1 diastolic dysfunction normal RV size no wall motion abnormalities Micro Data:  4/29 SARS CoV2, Flu > negative  Antimicrobials:  4/29 cefepime > 4/29 4/29 vancomycin > 4/29 4/29 flagyl > 4/29 Ancef 4/30>>>  Interim history/subjective:  Looks much better this morning Objective   Blood pressure (Abnormal) 138/97, pulse (Abnormal) 107, temperature 97.6 F (36.4 C), temperature source Axillary, resp. rate (Abnormal) 22, weight 86.9 kg, SpO2 97 %.        Intake/Output Summary (Last 24 hours) at 09/02/2019 0854 Last data filed at 09/02/2019 0865 Gross per 24 hour  Intake 1313.1 ml  Output 1100 ml  Net 213.1 ml   Filed Weights   08/31/19 0600 09/01/19 0500 09/02/19 0500  Weight: 85.6 kg 81.2 kg 86.9 kg    Examination: General 44 year old white male resting in bed no longer tremulous, he is calm, joking, and very appropriate today HEENT normocephalic atraumatic no jugular venous distention appreciated Pulmonary: Clear to auscultation currently on room air no accessory use Cardiac regular rate and rhythm without murmur rub or gallop Abdomen is soft nontender no organomegaly Extremities are warm and dry with brisk capillary refill he has scattered areas of ecchymosis Neuro awake oriented  calm and appropriate GU voids  Resolved Hospital Problem list     Assessment & Plan:   Acute toxic and metabolic encephalopathy in the setting of alcohol withdrawal/delirium tremens, further complicated by sepsis, with MSSA bacteremia -Placed on Precedex on 5/3 when agitated delirium got to the point where he would no longer take oral Librium; he looks much better now after overlapping Precedex with Librium and clonidine Plan Discontinue Precedex  Continue Librium taper and low-dose clonidine  Continue thiamine and folate  Seizure precautions  Supportive care    MSSA bacteremia Plan Day #4 Ancef  Follow-up blood cultures  Needs transesophageal echocardiogram  Infectious disease recommending imaging of right hip at some point   Multifocal pneumonia Portable chest x-ray on 5/2 showed left greater than right bilateral airspace disease Is not present on admission; at risk for septic emboli -Echo with normal EF  Plan Pulse oximetry  Titrate supplemental oxygen as indicated, currently not requiring  Anemia and melena.  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

## 2019-09-02 NOTE — Progress Notes (Signed)
PROGRESS NOTE    Patrick [name]  ZWC:[id number] DOB: Mar 22, 1976 DOA: 08/28/2019 PCP: Patient, No Pcp Per    Chief Complaint  Patient presents with  . Alcohol Intoxication  . Fatigue  . Nausea  . Hematemesis    Brief Narrative: HPI per Dr. Sherolyn Buba is a 44 y.o. male with medical history significant of hypertension, eczema, migraine headaches, alcohol abuse, nicotine use.  Patient does have a history of heavy smoking, does drink heavily.  Patient drinks 6-10 beers and also 6 shots of hard liquor a day.  Today he was trying to stand up sit in a chair when he slipped and he fell and hurt his right hip.  Patient lay on the side for some time EMS was called and got him to the ER.  As per EMS the patient was covered with feces.  Patient states that his last drink of alcohol was today around noontime.  Did have nausea and vomitings.  The patient is a very poor historian.  Getting history from him is very difficult.  As per the ER notes there was mention of some bloody vomiting.  Patient was found to have some blood around his mouth and also did bite his tongue.  Not sure whether he had any seizure. Patient has abnormal blood work and PCCM was consulted and was evaluated by Dr Lake Bells who thought the patient could be admitted to the hospitalist service.  Patient was treated with IV fluids in the ER.  Patient complains of generalized body aches.  Patient lives at home by himself, uses a walker to move around.  Patient had a temperature of 101.2 Fahrenheit, tachycardic, lactic acid greater than 11, concern for aspiration pneumonia due to his vomitings, patient was treated with broad-spectrum antibiotics in the ER.  His LFTs were elevated.  Platelets were low at 17,000.  Patient was also found to have significant hypokalemia and hypomagnesemia and electrolytes are being replaced.  ED Course: Patient was treated with IV fluids, potassium and magnesium were replaced in the ER, his lactic acid level has  come down.  CT of the abdomen and pelvis with contrast done in the ER showed marked diffuse hepatic steatosis, bilateral nonobstructing nephrolithiasis, chronic superior endplate compression deformity of L2 with mild bony retropulsion.  Assessment & Plan:   Principal Problem:   Severe sepsis with acute organ dysfunction due to methicillin susceptible Staphylococcus aureus (MSSA) (HCC) Active Problems:   MSSA bacteremia   Alcohol withdrawal (HCC)   Hypokalemia   Sepsis with acute liver failure without hepatic coma or septic shock (HCC)   Transaminitis  1 sepsis secondary to MSSA bacteremia, POA Patient met criteria for sepsis on admission with tachycardia, lactic acidosis, fever of 101.  Blood cultures positive for MSSA.  Urinalysis nitrite negative, leukocytes negative.  Chest x-ray negative for any acute infiltrate.  2D echo with a EF of 55 to 60%, no wall motion abnormalities, grade 1 diastolic dysfunction.  No vegetations noted.  Will need TEE.  Spoke with cardiology and patient will be scheduled for a TEE later on this week hopefully on Thursday as patient has improved clinically in terms of his alcohol withdrawal.  We will get plain films of the right hip as recommended by ID.  Repeat blood cultures from 09/01/2019 pending with no growth to date. Continue IV Ancef.  ID following and appreciate input and recommendations.   2.Severehypokalemia/hypomagnesemia/hypocalcemia/hypophosphatemia Likely secondary to alcohol abuse.  Potassium at 3.8 this morning.  Phosphorus was at 3.3, magnesium  at 1.7.  Patient received magnesium sulfate 2 g IV x1 early on this morning.  We will give another 2 g IV of magnesium sulfate. Vitamin D 25 hydroxy significantly depressed at 7.88. PTH pending.  Continue on Os-Cal with vitamin D 3 times daily.  Follow.  3.  Anemia of chronic disease/severe thrombocytopenia Likely secondary to bone marrow suppression from ongoing alcohol abuse and MSSA bacteremia with underlying  liver disease and concern for hepatitis.  Anemia panel consistent with anemia of chronic disease. Patient with melanotic stools per RN on 08/31/2019.  FOBT was positive.  This morning.  Platelet count at 84 from 47 from 34 from 20 from 11 from 17 on admission.  Last platelet count noted was 124 on 04/10/2018.  Decreasing platelets likely a dilutional component as well.  Transfusion threshold hemoglobin < 7.  Transfuse for platelets < 10 or acute bleed.  Due to severe thrombocytopenia hematology consulted and are following.  Continue IV PPI twice daily GI consulted and are following.  4.  Transaminitis/positive hepatitis C antibody Likely secondary to alcohol abuse.  CT abdomen and pelvis with diffuse hepatic steatosis.  Acute hepatitis panel positive hep C viral antibody.  Hepatitis C viral load at 1,470,000. ID following.   5.  Alcohol abuse/alcohol withdrawal Patient with significant alcohol abuse.  Patient initially placed on the Ativan withdrawal protocol as well as the Librium detox protocol.  Patient however with continued agitation and aggression and was refusing oral intake and was on Librium detox protocol which was discontinued.  Patient was on the Ativan withdrawal protocol however due to increased aggression and after receiving 8 mg of IV Ativan due to no significant improvement patient was transferred to the stepdown unit (08/31/2019) and started on a Precedex drip.  Patient has been weaned off the Precedex drip.  Patient improved clinically.  Patient started on thiamine 500 mg IV 3 times daily x3 days and then will subsequently go down to thiamine 100 mg daily.  Continue multivitamin, folic acid.  Currently on IV Ativan withdrawal protocol.  Will place on a Librium detox taper protocol in addition.  Supportive care.  PCCM was following and appreciate their input and recommendations.    6.  Dehydration Saline lock IV fluids.  7.??  Seizure like activity Concern likely secondary to alcohol abuse.   Patient noted on admission to have a tongue bite.  Head CT negative for any acute abnormalities.  EEG with no seizures or epileptiform discharges seen throughout the recording.  Patient with signs of alcohol withdrawal and aggression which has since improved..    8.??  GI bleed, POA Per admitting physician ED physician noted patient had some bloody vomitus however no bloody vomitus noted at time of admission.  Hemoglobin trending down felt to be possibly dilutional.  Per RN patient with melanotic stools the morning of 08/31/2019.  Repeat hemoglobin was 7.2 today from 7.6 from 9.4 from 8.3 from 7.8.  Continue IV PPI twice daily.  GI following.    9.  Probable volume overload Patient with decreased breath sounds in the bases and some coarse breath sounds/crackles on 09/01/2019.  Patient noted to have been  4L positive during this hospitalization.  Chest x-ray done with interval development of bilateral airspace opacities left greater than right with findings concerning for multifocal pneumonia.  Patient however afebrile, normal white count.  Chest x-ray findings likely volume overload.  Patient given a dose of Lasix 40 mg IV x1(08/31/2019) with a urine output of 3.450 L.  BNP noted at 459.7.  Patient given another dose of Lasix 20 mg IV x1 (09/01/2019) with a urine output of 1.4 L over the past 24 hours.  Repeat chest x-ray with faint residual peripheral airspace opacities, significantly decreased from comparison.  There was some concern for possible septic emboli versus multifocal pneumonia noted on chest x-ray however due to significant improvement with chest x-ray was likely volume overload.  Patient already on IV Ancef for MSSA bacteremia.  Strict I's and O's.  Daily weights.  Follow.   DVT prophylaxis: SCDs/patient with severe thrombocytopenia Code Status: Full Family Communication: No family at bedside. Disposition:   Status is: Inpatient    Dispo: The patient is from: Home              Anticipated  d/c is to: Likely home with home health versus SNF              Anticipated d/c date is: To be determined.  Pending work-up of bacteremia.              Patient currently with MSSA bacteremia, on IV antibiotics, and was in stepdown unit on Precedex drip which has been weaned off, ID and hematology following.  Patient with severe thrombocytopenia. Patient also noted to have melanotic stools (08/31/2019) with decreasing hemoglobin.  Patient needing a TEE.        Consultants:   ID: Dr. Baxter Flattery 08/29/2019  Hematology/oncology Dr. Alvy Bimler 08/29/2019  Gastroenterology: Dr. Cristina Gong 08/31/2019  PCCM: Dr. Elsworth Soho 09/01/2019  Procedures:  2D echo 08/29/2019  CT head 08/29/2019  CT abdomen and pelvis 08/28/2019  Chest x-ray 08/28/2019  EEG 08/29/2019  Plain films right hip pending 09/02/2019  Antimicrobials:   IV Ancef 08/29/2019   Subjective: Patient more alert today looking through the menu to order his breakfast.  No agitation noted.  Following commands appropriately.  Denies any chest pain.  No shortness of breath.  Precedex drip has been weaned off.  More pleasant today.    Objective: Vitals:   09/02/19 0744 09/02/19 0800 09/02/19 0900 09/02/19 0916  BP: (!) 138/97 103/84 (!) 87/70 96/67  Pulse: (!) 107 (!) 104 (!) 102 (!) 101  Resp: (!) 22 15 (!) 26 (!) 23  Temp: 97.6 F (36.4 C)     TempSrc: Axillary     SpO2: 97% (!) 85% 97% 98%  Weight:    82.6 kg  Height:    '5\' 10"'$  (1.778 m)    Intake/Output Summary (Last 24 hours) at 09/02/2019 1013 Last data filed at 09/02/2019 0900 Gross per 24 hour  Intake 1365.83 ml  Output 1100 ml  Net 265.83 ml   Filed Weights   09/01/19 0500 09/02/19 0500 09/02/19 0916  Weight: 81.2 kg 86.9 kg 82.6 kg    Examination:  General exam: Alert.  Dry mucous membranes. HEENT: Pupils equal round and reactive to light and accommodation.  Oropharynx with a purplish tongue with bite marks noted on the lateral tongue. Respiratory system: Some decreased breath  sounds in the bases.  Fair air movement.  Less diffuse coarse breath sounds/crackles.  Normal respiratory effort.  Cardiovascular system: RRR no murmurs rubs or gallops.  No JVD.  No lower extremity edema.  Gastrointestinal system: Abdomen is nontender, nondistended, soft, positive bowel sounds.  No rebound.  No guarding. Central nervous system: Moving extremities spontaneously. Extremities: Symmetric 5 x 5 power. Skin: Multiple areas of bruising noted.   Psychiatry: Judgement and insight appear fair. Mood & affect appropriate.  Data Reviewed: I have personally reviewed following labs and imaging studies  CBC: Recent Labs  Lab 08/28/19 1412 08/28/19 1412 08/29/19 0430 08/29/19 0430 08/30/19 2637 08/30/19 8588 08/31/19 0542 08/31/19 1349 08/31/19 2105 09/01/19 0331 09/02/19 0216  WBC 8.4   < > 5.7  --  3.8*  --  4.0  --   --  5.3 6.4  NEUTROABS 6.1  --   --   --   --   --   --   --   --   --   --   HGB 11.9*   < > 9.3*   < > 8.7*   < > 7.8* 8.3* 9.4* 7.6* 7.2*  HCT 33.4*   < > 26.6*   < > 26.2*   < > 23.5* 25.4* 28.6* 23.5* 22.9*  MCV 99.1   < > 101.1*  --  106.9*  --  105.4*  --   --  108.8* 111.7*  PLT 17*   < > 11*  --  20*  --  34*  --   --  47* 84*   < > = values in this interval not displayed.    Basic Metabolic Panel: Recent Labs  Lab 08/29/19 0430 08/29/19 1033 08/30/19 5027 08/31/19 0542 08/31/19 2105 09/01/19 0331 09/02/19 0216  NA 129*   < > 133* 137 137 134* 134*  K <2.0*   < > 2.8* 3.1* 3.7 3.7 3.8  CL 89*   < > 100 108 104 104 104  CO2 27   < > '25 23 23 25 22  '$ GLUCOSE 134*   < > 104* 103* 110* 91 94  BUN 13   < > '13 9 7 8 7  '$ CREATININE 0.86   < > 0.77 0.69 0.66 0.67 0.72  CALCIUM 7.0*   < > 7.3* 7.3* 7.8* 7.2* 7.6*  MG 3.3*  --  1.8 1.6* 1.7 1.6* 1.7  PHOS 1.3*  --  1.2* 1.7*  --  3.3  --    < > = values in this interval not displayed.    GFR: Estimated Creatinine Clearance: 121.7 mL/min (by C-G formula based on SCr of 0.72 mg/dL).  Liver  Function Tests: Recent Labs  Lab 08/29/19 1033 08/30/19 0712 08/31/19 0542 09/01/19 0331 09/02/19 0216  AST 190* 217* 182* 159* 155*  ALT 85* 73* 56* 48* 40  ALKPHOS 69 71 72 69 74  BILITOT 6.3* 4.7* 3.4* 2.8* 2.6*  PROT 5.6* 5.2* 4.9* 5.0* 4.9*  ALBUMIN 2.5* 2.5* 2.2* 2.2* 2.2*    CBG: Recent Labs  Lab 08/30/19 0800 08/31/19 0814 08/31/19 2108 09/01/19 0749 09/02/19 0819  GLUCAP 98 118* 107* 106* 158*     Recent Results (from the past 240 hour(s))  Blood Culture (routine x 2)     Status: Abnormal   Collection Time: 08/28/19  2:12 PM   Specimen: BLOOD  Result Value Ref Range Status   Specimen Description   Final    BLOOD LEFT ANTECUBITAL Performed at [facility], [place] [address], [place], [place] [address]    Special Requests   Final    BOTTLES DRAWN AEROBIC AND ANAEROBIC Blood Culture adequate volume Performed at [facility] [address] [place], [place] [address]    Culture  Setup Time   Final    GRAM POSITIVE COCCI IN BOTH AEROBIC AND ANAEROBIC BOTTLES CRITICAL RESULT CALLED TO, READ BACK BY AND VERIFIED WITH: Seleta Rhymes, PHARMD ([facility]) AT [phone] ON 08/29/19  BY C. JESSUP, MT. Performed at [facility], [place] [address], [place], [place] [address]    Culture STAPHYLOCOCCUS AUREUS (A)  Final   Report Status 08/31/2019 FINAL  Final   Organism ID, Bacteria STAPHYLOCOCCUS AUREUS  Final      Susceptibility   Staphylococcus aureus - MIC*    CIPROFLOXACIN 2 INTERMEDIATE Intermediate     ERYTHROMYCIN <=0.25 SENSITIVE Sensitive     GENTAMICIN <=0.5 SENSITIVE Sensitive     OXACILLIN 0.5 SENSITIVE Sensitive     TETRACYCLINE <=1 SENSITIVE Sensitive     VANCOMYCIN <=0.5 SENSITIVE Sensitive     TRIMETH/SULFA <=10 SENSITIVE Sensitive     CLINDAMYCIN <=0.25 SENSITIVE Sensitive     RIFAMPIN <=0.5 SENSITIVE Sensitive     Inducible Clindamycin NEGATIVE Sensitive     * STAPHYLOCOCCUS AUREUS  Blood Culture ID Panel (Reflexed)     Status:  Abnormal   Collection Time: 08/28/19  2:12 PM  Result Value Ref Range Status   Enterococcus species NOT DETECTED NOT DETECTED Final   Listeria monocytogenes NOT DETECTED NOT DETECTED Final   Staphylococcus species DETECTED (A) NOT DETECTED Final    Comment: CRITICAL RESULT CALLED TO, READ BACK BY AND VERIFIED WITH: E. JACKSON, PHARMD (WL) AT 9935 ON 08/29/19 BY C. JESSUP, MT.    Staphylococcus aureus (BCID) DETECTED (A) NOT DETECTED Final    Comment: Methicillin (oxacillin) susceptible Staphylococcus aureus (MSSA). Preferred therapy is anti staphylococcal beta lactam antibiotic (Cefazolin or Nafcillin), unless clinically contraindicated. CRITICAL RESULT CALLED TO, READ BACK BY AND VERIFIED WITH: E. JACKSON, PHARMD (WL) AT 7017 ON 08/29/19 BY C. JESSUP, MT.    Methicillin resistance NOT DETECTED NOT DETECTED Final   Streptococcus species NOT DETECTED NOT DETECTED Final   Streptococcus agalactiae NOT DETECTED NOT DETECTED Final   Streptococcus pneumoniae NOT DETECTED NOT DETECTED Final   Streptococcus pyogenes NOT DETECTED NOT DETECTED Final   Acinetobacter baumannii NOT DETECTED NOT DETECTED Final   Enterobacteriaceae species NOT DETECTED NOT DETECTED Final   Enterobacter cloacae complex NOT DETECTED NOT DETECTED Final   Escherichia coli NOT DETECTED NOT DETECTED Final   Klebsiella oxytoca NOT DETECTED NOT DETECTED Final   Klebsiella pneumoniae NOT DETECTED NOT DETECTED Final   Proteus species NOT DETECTED NOT DETECTED Final   Serratia marcescens NOT DETECTED NOT DETECTED Final   Haemophilus influenzae NOT DETECTED NOT DETECTED Final   Neisseria meningitidis NOT DETECTED NOT DETECTED Final   Pseudomonas aeruginosa NOT DETECTED NOT DETECTED Final   Candida albicans NOT DETECTED NOT DETECTED Final   Candida glabrata NOT DETECTED NOT DETECTED Final   Candida krusei NOT DETECTED NOT DETECTED Final   Candida parapsilosis NOT DETECTED NOT DETECTED Final   Candida tropicalis NOT DETECTED NOT  DETECTED Final    Comment: Performed at [facility], [address], [place], [place] [address]  Blood Culture (routine x 2)     Status: Abnormal   Collection Time: 08/28/19  2:27 PM   Specimen: BLOOD  Result Value Ref Range Status   Specimen Description   Final    BLOOD RIGHT ANTECUBITAL Performed at [facility] [address] [place], [place] [address]    Special Requests   Final    BOTTLES DRAWN AEROBIC AND ANAEROBIC Blood Culture adequate volume Performed at [facility] [address] [place], [place] [address]    Culture  Setup Time   Final    GRAM POSITIVE COCCI IN CLUSTERS ANAEROBIC BOTTLE ONLY CRITICAL VALUE NOTED.  VALUE IS CONSISTENT WITH PREVIOUSLY REPORTED AND CALLED VALUE.    Culture (A)  Final    STAPHYLOCOCCUS AUREUS SUSCEPTIBILITIES PERFORMED ON PREVIOUS CULTURE WITHIN THE LAST 5 DAYS. Performed at [facility], [place] [address], [place], [place] [address]    Report Status 08/31/2019 FINAL  Final  Respiratory Panel by RT PCR (Flu A&B, Covid) - Nasopharyngeal Swab     Status: None   Collection Time: 08/28/19  3:30 PM   Specimen: Nasopharyngeal Swab  Result Value Ref Range Status   SARS Coronavirus 2 by RT PCR NEGATIVE NEGATIVE Final    Comment: (NOTE) SARS-CoV-2 target nucleic acids are NOT DETECTED. The SARS-CoV-2 RNA is generally detectable in upper respiratoy specimens during the acute phase of infection. The lowest concentration of SARS-CoV-2 viral copies this assay can detect is 131 copies/mL. A negative result does not preclude SARS-Cov-2 infection and should not be used as the sole basis for treatment or other patient management decisions. A negative result may occur with  improper specimen collection/handling, submission of specimen other than nasopharyngeal swab, presence of viral mutation(s) within the areas targeted by this assay, and inadequate number of viral copies (<131 copies/mL). A negative  result must be combined with clinical observations, patient history, and epidemiological information. The expected result is Negative. Fact Sheet for Patients:  PinkCheek.be Fact Sheet for Healthcare Providers:  GravelBags.it This test is not yet ap proved or cleared by the [place] FDA and  has been authorized for detection and/or diagnosis of SARS-CoV-2 by FDA under an Emergency Use Authorization (EUA). This EUA will remain  in effect (meaning this test can be used) for the duration of the COVID-19 declaration under Section 564(b)(1) of the Act, 21 U.S.C. section 360bbb-3(b)(1), unless the authorization is terminated or revoked sooner.    Influenza A by PCR NEGATIVE NEGATIVE Final   Influenza B by PCR NEGATIVE NEGATIVE Final    Comment: (NOTE) The Xpert Xpress SARS-CoV-2/FLU/RSV assay is intended as an aid in  the diagnosis of influenza from Nasopharyngeal swab specimens and  should not be used as a sole basis for treatment. Nasal washings and  aspirates are unacceptable for Xpert Xpress SARS-CoV-2/FLU/RSV  testing. Fact Sheet for Patients: PinkCheek.be Fact Sheet for Healthcare Providers: GravelBags.it This test is not yet approved or cleared by the [place] FDA and  has been authorized for detection and/or diagnosis of SARS-CoV-2 by  FDA under an Emergency Use Authorization (EUA). This EUA will remain  in effect (meaning this test can be used) for the duration of the  Covid-19 declaration under Section 564(b)(1) of the Act, 21  U.S.C. section 360bbb-3(b)(1), unless the authorization is  terminated or revoked. Performed at [facility], [place] [address], [place], [place] [address]   Urine culture     Status: None   Collection Time: 08/28/19  4:26 PM   Specimen: In/Out Cath Urine  Result Value Ref Range Status   Specimen Description    Final    IN/OUT CATH URINE Performed at [facility] [address], [place], [place] [address]    Special Requests   Final    NONE Performed at [facility], [place] [address], [place], [place] [address]    Culture   Final    NO GROWTH Performed at [facility], [place] [address], [place], [place] [address]    Report Status 08/29/2019 FINAL  Final  Culture, blood (Routine X 2) w Reflex to ID Panel  Status: None (Preliminary result)   Collection Time: 08/30/19  1:16 PM   Specimen: Right Antecubital; Blood  Result Value Ref Range Status   Specimen Description   Final    RIGHT ANTECUBITAL Performed at [facility] [address], [place], [place] [address]    Special Requests   Final    BOTTLES DRAWN AEROBIC AND ANAEROBIC Blood Culture adequate volume Performed at [facility] [address] [place], [place] [address]    Culture   Final    NO GROWTH 3 DAYS Performed at [facility], [place] [address] [place], [place] [address]    Report Status PENDING  Incomplete  Culture, blood (Routine X 2) w Reflex to ID Panel     Status: None (Preliminary result)   Collection Time: 08/30/19  1:16 PM   Specimen: BLOOD RIGHT HAND  Result Value Ref Range Status   Specimen Description   Final    BLOOD RIGHT HAND Performed at [facility] [address], [place], [place] [address]    Special Requests   Final    BOTTLES DRAWN AEROBIC ONLY Blood Culture adequate volume Performed at [facility] [address], [place], [place] [address]    Culture   Final    NO GROWTH 3 DAYS Performed at [facility], [place] [address], [place], [place] [address]    Report Status PENDING  Incomplete  MRSA PCR Screening     Status: None   Collection Time: 08/31/19  8:17 PM   Specimen: Nasal Mucosa; Nasopharyngeal  Result Value Ref Range Status   MRSA by PCR NEGATIVE NEGATIVE  Final    Comment:        The GeneXpert MRSA Assay (FDA approved for NASAL specimens only), is one component of a comprehensive MRSA colonization surveillance program. It is not intended to diagnose MRSA infection nor to guide or monitor treatment for MRSA infections. Performed at [facility], [place] [address], [place], [place] [address]   Culture, blood (Routine X 2) w Reflex to ID Panel     Status: None (Preliminary result)   Collection Time: 09/01/19 10:01 AM   Specimen: BLOOD RIGHT HAND  Result Value Ref Range Status   Specimen Description   Final    BLOOD RIGHT HAND Performed at [facility] [address] [place], [place] [address]    Special Requests   Final    BOTTLES DRAWN AEROBIC AND ANAEROBIC Blood Culture results may not be optimal due to an inadequate volume of blood received in culture bottles Performed at [facility] [address], [place], [place] [address]    Culture   Final    NO GROWTH < 24 HOURS Performed at [facility] [address], [place], [place] [address]    Report Status PENDING  Incomplete  Culture, blood (Routine X 2) w Reflex to ID Panel     Status: None (Preliminary result)   Collection Time: 09/01/19 10:08 AM   Specimen: BLOOD LEFT HAND  Result Value Ref Range Status   Specimen Description   Final    BLOOD LEFT HAND Performed at [facility] [address] [place],  [address]    Special Requests   Final    BOTTLES DRAWN AEROBIC ONLY Blood Culture results may not be optimal due to an inadequate volume of blood received in culture bottles Performed at [facility],  [place] [address] [place], [place] [address]    Culture   Final    NO GROWTH < 24 HOURS Performed at [facility] [address] [place], [place] [address]    Report Status PENDING  Incomplete         Radiology Studies: DG CHEST PORT 1 VIEW  Result  Date: 09/02/2019 CLINICAL DATA:  Shortness of breath EXAM: PORTABLE CHEST 1 VIEW COMPARISON:  Radiograph 08/31/2019 FINDINGS: Chronic mild nonspecific elevation the right hemidiaphragm. Faint residual peripheral airspace opacities significantly decreased from comparison. No pneumothorax or effusion. The cardiomediastinal contours are unremarkable. No acute osseous or soft tissue abnormality. Telemetry leads overlie the chest. Nasal cannula projects over the upper chest. Remote posttraumatic deformity left clavicle. IMPRESSION: 1. Faint residual peripheral airspace opacities, significantly decreased from comparison. Electronically Signed   By: Lovena Le M.D.   On: 09/02/2019 06:33   DG CHEST PORT 1 VIEW  Result Date: 08/31/2019 CLINICAL DATA:  Melanotic stools.  Shortness of breath. EXAM: PORTABLE CHEST 1 VIEW COMPARISON:  08/28/2019 FINDINGS: There are new bilateral hazy airspace opacities, most evident in the left mid lung zone. There is no pneumothorax. There may be small bilateral pleural effusions. The heart size is essentially stable from prior study. There is no evidence for an acute displaced fracture. There is an old fracture of the left clavicle. IMPRESSION: Interval development of bilateral airspace opacities, greatest on the left. Findings are concerning for multifocal pneumonia (viral or bacterial). Electronically Signed   By: Constance Holster M.D.   On: 08/31/2019 18:23        Scheduled Meds: . calcium-vitamin D  2 tablet Oral TID  . Chlorhexidine Gluconate Cloth  6 each Topical Daily  . feeding supplement  1 Container Oral BID BM  . feeding supplement (PRO-STAT SUGAR FREE 64)  30 mL Oral BID  . folic acid  1 mg Oral Daily  . lisinopril  5 mg Oral Daily  . mouth rinse  15 mL Mouth Rinse BID  . multivitamin with minerals  1 tablet Oral Daily  . nicotine  21 mg Transdermal Daily  . pantoprazole (PROTONIX) IV  40 mg Intravenous Q12H  . [START ON 09/04/2019] thiamine  100 mg Oral  Daily   Continuous Infusions: .  ceFAZolin (ANCEF) IV Stopped (09/02/19 3744)  . dexmedetomidine (PRECEDEX) IV infusion Stopped (09/01/19 1610)  . magnesium sulfate bolus IVPB    . thiamine injection 500 mg (09/02/19 0910)     LOS: 5 days    Time spent: 40 minutes    Irine Seal, MD [facility]   To contact the attending provider between 7A-7P or the covering provider during after hours 7P-7A, please log into the web site [email or web address] and access using universal [place] password for that web site. If you do not have the password, please call the hospital operator.  09/02/2019, 10:13 AM

## 2019-09-02 NOTE — Progress Notes (Signed)
[facility] ADULT ICU REPLACEMENT PROTOCOL FOR AM LAB REPLACEMENT ONLY  The patient does apply for the [facility] Adult ICU Electrolyte Replacment Protocol based on the criteria listed below:   1. Is GFR >/= 40 ml/min? Yes.    Patient's GFR today is >60 2. Is urine output >/= 0.5 ml/kg/hr for the last 6 hours? Yes.   Patient's UOP is 0.51 ml/kg/hr 3. Is BUN < 60 mg/dL? Yes.    Patient's BUN today is 7 4. Abnormal electrolyte(s): Mag 1.7 5. Ordered repletion with: protocol 6. If a panic level lab has been reported, has the CCM MD in charge been notified? Yes.  .   Physician:  Dr.Sommer  Lolita Lenz 09/02/2019 5:18 AM

## 2019-09-02 NOTE — Progress Notes (Signed)
[facility] Gastroenterology Progress Note  Patrick [name] 44 y.o. December 27, 1975  CC:  Anemia, heme-positive stools  Subjective: Patient reports feeling better today.  He denies any abdominal pain, nausea, vomiting.  Denies chest pain or shortness of breath.  Per flowsheet, patient had 1 brown bowel movement today and 1 brown bowel movement yesterday.  No reported melena or hematochezia.  He is able to provide more subjective information and history today.  He states that approximately 5 days prior to admission, he had some nausea with vomiting of brown liquid mixed with some red blood.  He denies seeing any melena or hematochezia.  He endorses chronic use of ibuprofen and aspirin, alternating between the 2 medications.  He takes at least 1 of these medications daily. He denies any prior IV drug use and denies knowledge of hepatitis C infection.  ROS : Review of Systems  Respiratory: Negative for cough and shortness of breath.   Cardiovascular: Negative for chest pain and palpitations.  Gastrointestinal: Negative for abdominal pain, blood in stool, constipation, diarrhea, heartburn, melena, nausea and vomiting.    Objective: Vital signs in last 24 hours: Vitals:   09/02/19 1059 09/02/19 1100  BP:  120/79  Pulse: (!) 105 (!) 103  Resp: 15 18  Temp:    SpO2: 98% 99%    Physical Exam:  General:  Lethargic, oriented, cooperative  Head:  Normocephalic, without obvious abnormality, atraumatic  Eyes:  Conjunctival pallor, EOM's intact,   Lungs:   Mild diffuse expiratory wheezing bilaterally, mild tachypnea  Heart:  Mildly tachycardic, regular rhythm, S1, S2 normal  Abdomen:   Soft, non-tender, bowel sounds active all four quadrants,  no masses, no peritoneal signs  Extremities: Extremities normal, atraumatic, no  edema  Pulses: 2+ and symmetric    Lab Results: Recent Labs    08/31/19 0542 08/31/19 2105 09/01/19 0331 09/02/19 0216  NA 137   < > 134* 134*  K 3.1*   < > 3.7 3.8  CL 108    < > 104 104  CO2 23   < > 25 22  GLUCOSE 103*   < > 91 94  BUN 9   < > 8 7  CREATININE 0.69   < > 0.67 0.72  CALCIUM 7.3*   < > 7.2* 7.6*  MG 1.6*   < > 1.6* 1.7  PHOS 1.7*  --  3.3  --    < > = values in this interval not displayed.   Recent Labs    09/01/19 0331 09/02/19 0216  AST 159* 155*  ALT 48* 40  ALKPHOS 69 74  BILITOT 2.8* 2.6*  PROT 5.0* 4.9*  ALBUMIN 2.2* 2.2*   Recent Labs    09/01/19 0331 09/02/19 0216  WBC 5.3 6.4  HGB 7.6* 7.2*  HCT 23.5* 22.9*  MCV 108.8* 111.7*  PLT 47* 84*   No results for input(s): LABPROT, INR in the last 72 hours.  Assessment: Anemia and melena: Hgb 7.2 today, mildly decreased from 7.6 yesterday.  No further signs of GI bleeding. Suspect recent melena and drop in hemoglobin are due to mucosal bleeding from thrombocytopenia.  Patient's platelet count has improved to 84K.  Renal function remains within normal limits (BUN 7/Cr 0.73)  Thrombocytopenia: improving, platelets now at 84K (improved from 20K on 5/1)  Hepatitis C infection:  HCV Quantitative: 1,470,000.  Genotype pending.  Transaminitis: suspect this is due to heavy alcohol use.  Patient also has active Hepatitis C.  LFTs trending down.  T bili  2.6/AST 155/ALT 40/alk phos 74 today.  Sepsis: MSSA bacteremia  Plan: Continue to monitor platelets and H&H with transfusion as needed to maintain Hgb >7.  Continue to monitor renal function and LFTs.  Patient will need treatment for Hepatitis C after alcohol cessation.  Continue Protonix IV BID.  Continue to monitor for signs of GI bleeding.  Continue to monitor clinical course.  We may consider EGD prior to discharge if anemia or signs of GI bleeding persist.  [facility] will follow.   Salley Slaughter PA-C 09/02/2019, 11:20 AM  Contact #  [phone]

## 2019-09-02 NOTE — Progress Notes (Signed)
eLink Physician-Brief Progress Note Patient Name: Avenir Lozinski DOB: 11/18/1975 MRN: [id number]   Date of Service  09/02/2019  HPI/Events of Note  Calcium = 7.6 which corrects to 9.04 (Normal) given Albumin = 2.2.   eICU Interventions  No intervention indicated.      Intervention Category Major Interventions: Electrolyte abnormality - evaluation and management  Kathryn Cosby Eugene 09/02/2019, 5:26 AM

## 2019-09-02 NOTE — Progress Notes (Signed)
INFECTIOUS DISEASE PROGRESS NOTE  ID: Patrick [name] is a 44 y.o. male with  Principal Problem:   Severe sepsis with acute organ dysfunction due to methicillin susceptible Staphylococcus aureus (MSSA) (HCC) Active Problems:   Alcohol withdrawal (HCC)   MSSA bacteremia   Hypokalemia   Sepsis with acute liver failure without hepatic coma or septic shock (HCC)   Transaminitis  Subjective: No complaints Denies fever.   Abtx:  Anti-infectives (From admission, onward)   Start     Dose/Rate Route Frequency Ordered Stop   08/29/19 2200  ceFAZolin (ANCEF) IVPB 2g/100 mL premix     2 g 200 mL/hr over 30 Minutes Intravenous Every 8 hours 08/29/19 1042     08/29/19 1015  ceFAZolin (ANCEF) IVPB 2g/100 mL premix     2 g 200 mL/hr over 30 Minutes Intravenous NOW 08/29/19 1003 08/29/19 1430   08/28/19 1400  ceFEPIme (MAXIPIME) 2 g in sodium chloride 0.9 % 100 mL IVPB     2 g 200 mL/hr over 30 Minutes Intravenous  Once 08/28/19 1341 08/28/19 1511   08/28/19 1345  metroNIDAZOLE (FLAGYL) IVPB 500 mg     500 mg 100 mL/hr over 60 Minutes Intravenous  Once 08/28/19 1341 08/28/19 1609   08/28/19 1345  vancomycin (VANCOCIN) IVPB 1000 mg/200 mL premix     1,000 mg 200 mL/hr over 60 Minutes Intravenous  Once 08/28/19 1341 08/28/19 1638      Medications:  Scheduled: . calcium-vitamin D  2 tablet Oral TID  . Chlorhexidine Gluconate Cloth  6 each Topical Daily  . feeding supplement  1 Container Oral BID BM  . feeding supplement (PRO-STAT SUGAR FREE 64)  30 mL Oral BID  . folic acid  1 mg Oral Daily  . lisinopril  5 mg Oral Daily  . mouth rinse  15 mL Mouth Rinse BID  . multivitamin with minerals  1 tablet Oral Daily  . nicotine  21 mg Transdermal Daily  . pantoprazole (PROTONIX) IV  40 mg Intravenous Q12H  . [START ON 09/04/2019] thiamine  100 mg Oral Daily    Objective: Vital signs in last 24 hours: Temp:  [97.4 F (36.3 C)-99.2 F (37.3 C)] 97.6 F (36.4 C) (05/04 0744) Pulse Rate:   [86-107] 101 (05/04 0916) Resp:  [15-26] 23 (05/04 0916) BP: (87-138)/(67-98) 96/67 (05/04 0916) SpO2:  [85 %-99 %] 98 % (05/04 0916) Weight:  [82.6 kg-86.9 kg] 82.6 kg (05/04 0916)   General appearance: alert, cooperative and no distress Resp: clear to auscultation bilaterally Cardio: regular rate and rhythm GI: normal findings: bowel sounds normal and soft, non-tender Extremities: edema none  Lab Results Recent Labs    09/01/19 0331 09/02/19 0216  WBC 5.3 6.4  HGB 7.6* 7.2*  HCT 23.5* 22.9*  NA 134* 134*  K 3.7 3.8  CL 104 104  CO2 25 22  BUN 8 7  CREATININE 0.67 0.72   Liver Panel Recent Labs    09/01/19 0331 09/02/19 0216  PROT 5.0* 4.9*  ALBUMIN 2.2* 2.2*  AST 159* 155*  ALT 48* 40  ALKPHOS 69 74  BILITOT 2.8* 2.6*   Sedimentation Rate No results for input(s): ESRSEDRATE in the last 72 hours. C-Reactive Protein No results for input(s): CRP in the last 72 hours.  Microbiology: Recent Results (from the past 240 hour(s))  Blood Culture (routine x 2)     Status: Abnormal   Collection Time: 08/28/19  2:12 PM   Specimen: BLOOD  Result Value Ref Range Status  Specimen Description   Final    BLOOD LEFT ANTECUBITAL Performed at [facility], [address] [place], [place] [address]    Special Requests   Final    BOTTLES DRAWN AEROBIC AND ANAEROBIC Blood Culture adequate volume Performed at [facility], [address], [place], [place] [address]    Culture  Setup Time   Final    GRAM POSITIVE COCCI IN BOTH AEROBIC AND ANAEROBIC BOTTLES CRITICAL RESULT CALLED TO, READ BACK BY AND VERIFIED WITH: E. JACKSON, PHARMD (WL) AT 3300 ON 08/29/19 BY C. JESSUP, MT. Performed at [facility], [address] [place], [place] [address]    Culture STAPHYLOCOCCUS AUREUS (A)  Final   Report Status 08/31/2019 FINAL  Final   Organism ID, Bacteria STAPHYLOCOCCUS AUREUS  Final      Susceptibility   Staphylococcus aureus - MIC*     CIPROFLOXACIN 2 INTERMEDIATE Intermediate     ERYTHROMYCIN <=0.25 SENSITIVE Sensitive     GENTAMICIN <=0.5 SENSITIVE Sensitive     OXACILLIN 0.5 SENSITIVE Sensitive     TETRACYCLINE <=1 SENSITIVE Sensitive     VANCOMYCIN <=0.5 SENSITIVE Sensitive     TRIMETH/SULFA <=10 SENSITIVE Sensitive     CLINDAMYCIN <=0.25 SENSITIVE Sensitive     RIFAMPIN <=0.5 SENSITIVE Sensitive     Inducible Clindamycin NEGATIVE Sensitive     * STAPHYLOCOCCUS AUREUS  Blood Culture ID Panel (Reflexed)     Status: Abnormal   Collection Time: 08/28/19  2:12 PM  Result Value Ref Range Status   Enterococcus species NOT DETECTED NOT DETECTED Final   Listeria monocytogenes NOT DETECTED NOT DETECTED Final   Staphylococcus species DETECTED (A) NOT DETECTED Final    Comment: CRITICAL RESULT CALLED TO, READ BACK BY AND VERIFIED WITH: E. JACKSON, PHARMD (WL) AT 3335 ON 08/29/19 BY C. JESSUP, MT.    Staphylococcus aureus (BCID) DETECTED (A) NOT DETECTED Final    Comment: Methicillin (oxacillin) susceptible Staphylococcus aureus (MSSA). Preferred therapy is anti staphylococcal beta lactam antibiotic (Cefazolin or Nafcillin), unless clinically contraindicated. CRITICAL RESULT CALLED TO, READ BACK BY AND VERIFIED WITH: E. JACKSON, PHARMD (WL) AT 4562 ON 08/29/19 BY C. JESSUP, MT.    Methicillin resistance NOT DETECTED NOT DETECTED Final   Streptococcus species NOT DETECTED NOT DETECTED Final   Streptococcus agalactiae NOT DETECTED NOT DETECTED Final   Streptococcus pneumoniae NOT DETECTED NOT DETECTED Final   Streptococcus pyogenes NOT DETECTED NOT DETECTED Final   Acinetobacter baumannii NOT DETECTED NOT DETECTED Final   Enterobacteriaceae species NOT DETECTED NOT DETECTED Final   Enterobacter cloacae complex NOT DETECTED NOT DETECTED Final   Escherichia coli NOT DETECTED NOT DETECTED Final   Klebsiella oxytoca NOT DETECTED NOT DETECTED Final   Klebsiella pneumoniae NOT DETECTED NOT DETECTED Final   Proteus species NOT  DETECTED NOT DETECTED Final   Serratia marcescens NOT DETECTED NOT DETECTED Final   Haemophilus influenzae NOT DETECTED NOT DETECTED Final   Neisseria meningitidis NOT DETECTED NOT DETECTED Final   Pseudomonas aeruginosa NOT DETECTED NOT DETECTED Final   Candida albicans NOT DETECTED NOT DETECTED Final   Candida glabrata NOT DETECTED NOT DETECTED Final   Candida krusei NOT DETECTED NOT DETECTED Final   Candida parapsilosis NOT DETECTED NOT DETECTED Final   Candida tropicalis NOT DETECTED NOT DETECTED Final    Comment: Performed at [facility], [address], [place], [place] [address]  Blood Culture (routine x 2)     Status: Abnormal   Collection Time:  08/28/19  2:27 PM   Specimen: BLOOD  Result Value Ref Range Status   Specimen Description   Final    BLOOD RIGHT ANTECUBITAL Performed at [facility], [address], [place], [place] [address]    Special Requests   Final    BOTTLES DRAWN AEROBIC AND ANAEROBIC Blood Culture adequate volume Performed at [facility], [address], [place], [place] [address]    Culture  Setup Time   Final    GRAM POSITIVE COCCI IN CLUSTERS ANAEROBIC BOTTLE ONLY CRITICAL VALUE NOTED.  VALUE IS CONSISTENT WITH PREVIOUSLY REPORTED AND CALLED VALUE.    Culture (A)  Final    STAPHYLOCOCCUS AUREUS SUSCEPTIBILITIES PERFORMED ON PREVIOUS CULTURE WITHIN THE LAST 5 DAYS. Performed at [facility], [address], [place], [place] [address]    Report Status 08/31/2019 FINAL  Final  Respiratory Panel by RT PCR (Flu A&B, Covid) - Nasopharyngeal Swab     Status: None   Collection Time: 08/28/19  3:30 PM   Specimen: Nasopharyngeal Swab  Result Value Ref Range Status   SARS Coronavirus 2 by RT PCR NEGATIVE NEGATIVE Final    Comment: (NOTE) SARS-CoV-2 target nucleic acids are NOT DETECTED. The SARS-CoV-2 RNA is generally detectable in upper respiratoy specimens during the acute phase of infection. The  lowest concentration of SARS-CoV-2 viral copies this assay can detect is 131 copies/mL. A negative result does not preclude SARS-Cov-2 infection and should not be used as the sole basis for treatment or other patient management decisions. A negative result may occur with  improper specimen collection/handling, submission of specimen other than nasopharyngeal swab, presence of viral mutation(s) within the areas targeted by this assay, and inadequate number of viral copies (<131 copies/mL). A negative result must be combined with clinical observations, patient history, and epidemiological information. The expected result is Negative. Fact Sheet for Patients:  https://www.moore.com/ Fact Sheet for Healthcare Providers:  https://www.young.biz/ This test is not yet ap proved or cleared by the [place] FDA and  has been authorized for detection and/or diagnosis of SARS-CoV-2 by FDA under an Emergency Use Authorization (EUA). This EUA will remain  in effect (meaning this test can be used) for the duration of the COVID-19 declaration under Section 564(b)(1) of the Act, 21 U.S.C. section 360bbb-3(b)(1), unless the authorization is terminated or revoked sooner.    Influenza A by PCR NEGATIVE NEGATIVE Final   Influenza B by PCR NEGATIVE NEGATIVE Final    Comment: (NOTE) The Xpert Xpress SARS-CoV-2/FLU/RSV assay is intended as an aid in  the diagnosis of influenza from Nasopharyngeal swab specimens and  should not be used as a sole basis for treatment. Nasal washings and  aspirates are unacceptable for Xpert Xpress SARS-CoV-2/FLU/RSV  testing. Fact Sheet for Patients: https://www.moore.com/ Fact Sheet for Healthcare Providers: https://www.young.biz/ This test is not yet approved or cleared by the [place] FDA and  has been authorized for detection and/or diagnosis of SARS-CoV-2 by  FDA under an Emergency  Use Authorization (EUA). This EUA will remain  in effect (meaning this test can be used) for the duration of the  Covid-19 declaration under Section 564(b)(1) of the Act, 21  U.S.C. section 360bbb-3(b)(1), unless the authorization is  terminated or revoked. Performed at [facility], [address] [place], [place] [address]   Urine culture     Status: None   Collection Time: 08/28/19  4:26 PM   Specimen: In/Out Cath Urine  Result Value Ref  Range Status   Specimen Description   Final    IN/OUT CATH URINE Performed at [facility], [address] [place], [place] [address]    Special Requests   Final    NONE Performed at [facility], [address] [place], [place] [address]    Culture   Final    NO GROWTH Performed at [facility], [address] [place], [place] [address]    Report Status 08/29/2019 FINAL  Final  Culture, blood (Routine X 2) w Reflex to ID Panel     Status: None (Preliminary result)   Collection Time: 08/30/19  1:16 PM   Specimen: Right Antecubital; Blood  Result Value Ref Range Status   Specimen Description   Final    RIGHT ANTECUBITAL Performed at [facility], [address] [place], [place] [address]    Special Requests   Final    BOTTLES DRAWN AEROBIC AND ANAEROBIC Blood Culture adequate volume Performed at [facility], [address] [place], [place] [address]    Culture   Final    NO GROWTH 3 DAYS Performed at [facility], [address] [place], [place] [address]    Report Status PENDING  Incomplete  Culture, blood (Routine X 2) w Reflex to ID Panel     Status: None (Preliminary result)   Collection Time: 08/30/19  1:16 PM   Specimen: BLOOD RIGHT HAND  Result Value Ref Range Status   Specimen Description   Final    BLOOD RIGHT HAND Performed at [facility], [address] [place], [place] [address]     Special Requests   Final    BOTTLES DRAWN AEROBIC ONLY Blood Culture adequate volume Performed at [facility], [address] [place], [place] [address]    Culture   Final    NO GROWTH 3 DAYS Performed at [facility], [address] [place], [place] [address]    Report Status PENDING  Incomplete  MRSA PCR Screening     Status: None   Collection Time: 08/31/19  8:17 PM   Specimen: Nasal Mucosa; Nasopharyngeal  Result Value Ref Range Status   MRSA by PCR NEGATIVE NEGATIVE Final    Comment:        The GeneXpert MRSA Assay (FDA approved for NASAL specimens only), is one component of a comprehensive MRSA colonization surveillance program. It is not intended to diagnose MRSA infection nor to guide or monitor treatment for MRSA infections. Performed at [facility], [address] [place], [place] [address]   Culture, blood (Routine X 2) w Reflex to ID Panel     Status: None (Preliminary result)   Collection Time: 09/01/19 10:01 AM   Specimen: BLOOD RIGHT HAND  Result Value Ref Range Status   Specimen Description   Final    BLOOD RIGHT HAND Performed at [facility], [address] [place], [place] [address]    Special Requests   Final    BOTTLES DRAWN AEROBIC AND ANAEROBIC Blood Culture results may not be optimal due to an inadequate volume of blood received in culture bottles Performed at [facility], [address] [place], [place] [address]    Culture   Final    NO GROWTH < 24 HOURS Performed at [facility], [address] [place], [place] [address]    Report Status PENDING  Incomplete  Culture, blood (Routine X 2) w Reflex to ID Panel     Status: None (Preliminary result)   Collection Time: 09/01/19 10:08 AM   Specimen: BLOOD LEFT HAND  Result Value Ref Range Status   Specimen Description   Final    BLOOD LEFT HAND Performed at [place]  [address], [place], [place] [address]    Special Requests   Final    BOTTLES DRAWN AEROBIC ONLY Blood Culture results may not be optimal due to an inadequate volume of blood received in culture bottles Performed at [place] [address] [place], [place] [address]    Culture   Final    NO GROWTH < 24 HOURS Performed at [place] [address] [place], [place] [address]    Report Status PENDING  Incomplete    Studies/Results: DG CHEST PORT 1 VIEW  Result Date: 09/02/2019 CLINICAL DATA:  Shortness of breath EXAM: PORTABLE CHEST 1 VIEW COMPARISON:  Radiograph 08/31/2019 FINDINGS: Chronic mild nonspecific elevation the right hemidiaphragm. Faint residual peripheral airspace opacities significantly decreased from comparison. No pneumothorax or effusion. The cardiomediastinal contours are unremarkable. No acute osseous or soft tissue abnormality. Telemetry leads overlie the chest. Nasal cannula projects over the upper chest. Remote posttraumatic deformity left clavicle. IMPRESSION: 1. Faint residual peripheral airspace opacities, significantly decreased from comparison. Electronically Signed   By: Lovena Le M.D.   On: 09/02/2019 06:33   DG CHEST PORT 1 VIEW  Result Date: 08/31/2019 CLINICAL DATA:  Melanotic stools.  Shortness of breath. EXAM: PORTABLE CHEST 1 VIEW COMPARISON:  08/28/2019 FINDINGS: There are new bilateral hazy airspace opacities, most evident in the left mid lung zone. There is no pneumothorax. There may be small bilateral pleural effusions. The heart size is essentially stable from prior study. There is no evidence for an acute displaced fracture. There is an old fracture of the left clavicle. IMPRESSION: Interval development of bilateral airspace opacities, greatest on the left. Findings are concerning for multifocal pneumonia (viral or bacterial). Electronically Signed   By: Constance Holster M.D.   On: 08/31/2019 18:23      Assessment/Plan: ETOH abuse, withdrawal MSSA sepsis Melena, Anemia Hepatitis C  Total days of antibiotics: 6 ancef  Repeat BCx sent 5-3, ngtd      Needs TEE Await Endoscopies for his melena/anemia. Needs to f/u for his Hep C. With his ETOH use this could result in poor outcome.  Will send hep C genotype.  He needs Hep A and B vaccine.    Bobby Rumpf MD, FACP Infectious Diseases (pager) [phone] [email or web address] 09/02/2019, 10:08 AM  LOS: 5 days

## 2019-09-03 DIAGNOSIS — B958 Unspecified staphylococcus as the cause of diseases classified elsewhere: Secondary | ICD-10-CM

## 2019-09-03 LAB — CBC WITH DIFFERENTIAL/PLATELET
Abs Immature Granulocytes: 0.41 10*3/uL — ABNORMAL HIGH (ref 0.00–0.07)
Basophils Absolute: 0 10*3/uL (ref 0.0–0.1)
Basophils Relative: 0 %
Eosinophils Absolute: 0.1 10*3/uL (ref 0.0–0.5)
Eosinophils Relative: 1 %
HCT: 26.9 % — ABNORMAL LOW (ref 39.0–52.0)
Hemoglobin: 8.4 g/dL — ABNORMAL LOW (ref 13.0–17.0)
Immature Granulocytes: 8 %
Lymphocytes Relative: 29 %
Lymphs Abs: 1.6 10*3/uL (ref 0.7–4.0)
MCH: 34.9 pg — ABNORMAL HIGH (ref 26.0–34.0)
MCHC: 31.2 g/dL (ref 30.0–36.0)
MCV: 111.6 fL — ABNORMAL HIGH (ref 80.0–100.0)
Monocytes Absolute: 0.5 10*3/uL (ref 0.1–1.0)
Monocytes Relative: 9 %
Neutro Abs: 2.9 10*3/uL (ref 1.7–7.7)
Neutrophils Relative %: 53 %
Platelets: 148 10*3/uL — ABNORMAL LOW (ref 150–400)
RBC: 2.41 MIL/uL — ABNORMAL LOW (ref 4.22–5.81)
RDW: 15.7 % — ABNORMAL HIGH (ref 11.5–15.5)
WBC: 5.4 10*3/uL (ref 4.0–10.5)
nRBC: 0.6 % — ABNORMAL HIGH (ref 0.0–0.2)

## 2019-09-03 LAB — COMPREHENSIVE METABOLIC PANEL
ALT: 39 U/L (ref 0–44)
AST: 144 U/L — ABNORMAL HIGH (ref 15–41)
Albumin: 2.5 g/dL — ABNORMAL LOW (ref 3.5–5.0)
Alkaline Phosphatase: 83 U/L (ref 38–126)
Anion gap: 9 (ref 5–15)
BUN: 6 mg/dL (ref 6–20)
CO2: 23 mmol/L (ref 22–32)
Calcium: 7.9 mg/dL — ABNORMAL LOW (ref 8.9–10.3)
Chloride: 103 mmol/L (ref 98–111)
Creatinine, Ser: 0.75 mg/dL (ref 0.61–1.24)
GFR calc Af Amer: >60 mL/min (ref >60)
GFR calc non Af Amer: >60 mL/min (ref >60)
Glucose, Bld: 98 mg/dL (ref 70–99)
Potassium: 3.2 mmol/L — ABNORMAL LOW (ref 3.5–5.1)
Sodium: 135 mmol/L (ref 135–145)
Total Bilirubin: 2.7 mg/dL — ABNORMAL HIGH (ref 0.3–1.2)
Total Protein: 5.9 g/dL — ABNORMAL LOW (ref 6.5–8.1)

## 2019-09-03 LAB — MAGNESIUM: Magnesium: 1.7 mg/dL (ref 1.7–2.4)

## 2019-09-03 LAB — PHOSPHORUS: Phosphorus: 2.9 mg/dL (ref 2.5–4.6)

## 2019-09-03 LAB — GLUCOSE, CAPILLARY: Glucose-Capillary: 124 mg/dL — ABNORMAL HIGH (ref 70–99)

## 2019-09-03 LAB — LIPASE, BLOOD: Lipase: 91 U/L — ABNORMAL HIGH (ref 11–51)

## 2019-09-03 LAB — PROTIME-INR
INR: 1.3 — ABNORMAL HIGH (ref 0.8–1.2)
Prothrombin Time: 15.9 seconds — ABNORMAL HIGH (ref 11.4–15.2)

## 2019-09-03 MED ORDER — SODIUM CHLORIDE 0.9% FLUSH
10.0000 mL | Freq: Two times a day (BID) | INTRAVENOUS | Status: DC
  Administered 2019-09-04 – 2019-09-05 (×2): 10 mL

## 2019-09-03 MED ORDER — PANTOPRAZOLE SODIUM 40 MG PO TBEC
40.0000 mg | DELAYED_RELEASE_TABLET | Freq: Two times a day (BID) | ORAL | Status: DC
  Administered 2019-09-03 – 2019-09-05 (×4): 40 mg via ORAL
  Filled 2019-09-03 (×5): qty 1

## 2019-09-03 MED ORDER — POTASSIUM CHLORIDE CRYS ER 20 MEQ PO TBCR
40.0000 meq | EXTENDED_RELEASE_TABLET | Freq: Every day | ORAL | Status: DC
  Administered 2019-09-03 – 2019-09-05 (×3): 40 meq via ORAL
  Filled 2019-09-03 (×3): qty 2

## 2019-09-03 MED ORDER — MAGNESIUM OXIDE 400 (241.3 MG) MG PO TABS
800.0000 mg | ORAL_TABLET | Freq: Two times a day (BID) | ORAL | Status: AC
  Administered 2019-09-03 (×2): 800 mg via ORAL
  Filled 2019-09-03 (×2): qty 2

## 2019-09-03 MED ORDER — SODIUM CHLORIDE 0.9% FLUSH: 10.0000 mL | INTRAVENOUS | Status: DC | PRN

## 2019-09-03 NOTE — Progress Notes (Signed)
Patient is not seen Labs are reviewed Platelets are nearly normal His recent thrombocytopenia is likely due to alcoholism Anemia could be related to recent GI bleed I will sign off. No further hematology follow-up is needed Please call if questions arise

## 2019-09-03 NOTE — Evaluation (Signed)
Physical Therapy Evaluation Patient Details Name: Patrick [name] MRN: [id number] DOB: 1975-12-20 Today's Date: 09/03/2019   History of Present Illness  44 yo male admitted with MSSA bacteremia, weakness. Hx of ETOH abuse, Hep C, T12 comp fx, rib fx  Clinical Impression  On eval, pt required Min assist for mobility. He walked ~15 feet x 2 ( to and from bathroom) with use of a RW. Pt presents with general weakness, decreased activity tolerance, and impaired gait and balance. He is unsteady and remains at risk for falls when mobilizing. Moderate pain with activity. Will continue to follow and progress activity as tolerated.     Follow Up Recommendations Supervision/Assistance - 24 hour    Equipment Recommendations  None recommended by PT    Recommendations for Other Services       Precautions / Restrictions Precautions Precautions: Fall Restrictions Weight Bearing Restrictions: No      Mobility  Bed Mobility Overal bed mobility: Needs Assistance Bed Mobility: Supine to Sit;Sit to Supine     Supine to sit: HOB elevated;Supervision Sit to supine: HOB elevated;Supervision      Transfers Overall transfer level: Needs assistance Equipment used: Rolling walker (2 wheeled) Transfers: Sit to/from Stand Sit to Stand: Min assist         General transfer comment: Assist to rise, stabilize, control descent. VCs safety, hand placement  Ambulation/Gait Ambulation/Gait assistance: Min assist Gait Distance (Feet): 15 Feet(x2) Assistive device: Rolling walker (2 wheeled) Gait Pattern/deviations: Step-through pattern;Decreased step length - right;Decreased step length - left;Trunk flexed     General Gait Details: Assist to stabilize pt throughout distance. Distance limited by pain, fatigue.  Stairs            Wheelchair Mobility    Modified Rankin (Stroke Patients Only)       Balance Overall balance assessment: Needs assistance         Standing balance support:  Bilateral upper extremity supported Standing balance-Leahy Scale: Poor                               Pertinent Vitals/Pain Pain Assessment: Faces Faces Pain Scale: Hurts even more Pain Location: back Pain Descriptors / Indicators: Discomfort;Sore;Aching Pain Intervention(s): Limited activity within patient's tolerance;Monitored during session;Repositioned    Home Living Family/patient expects to be discharged to:: Private residence Living Arrangements: Alone Available Help at Discharge: Friend(s) Type of Home: House       Home Layout: Multi-level Home Equipment: Environmental consultant - 2 wheels;Bedside commode      Prior Function Level of Independence: Independent with assistive device(s)         Comments: uses RW PRN     Hand Dominance        Extremity/Trunk Assessment   Upper Extremity Assessment Upper Extremity Assessment: Overall WFL for tasks assessed    Lower Extremity Assessment Lower Extremity Assessment: Generalized weakness    Cervical / Trunk Assessment Cervical / Trunk Assessment: Normal  Communication   Communication: No difficulties  Cognition Arousal/Alertness: Awake/alert Behavior During Therapy: WFL for tasks assessed/performed Overall Cognitive Status: Within Functional Limits for tasks assessed                                        General Comments      Exercises     Assessment/Plan    PT Assessment Patient needs continued PT  services  PT Problem List Decreased strength;Decreased activity tolerance;Decreased balance;Decreased mobility;Decreased knowledge of use of DME;Pain       PT Treatment Interventions DME instruction;Gait training;Therapeutic activities;Therapeutic exercise;Patient/family education;Balance training;Functional mobility training    PT Goals (Current goals can be found in the Care Plan section)  Acute Rehab PT Goals Patient Stated Goal: less pain. to feel better. PT Goal Formulation: With  patient Time For Goal Achievement: 09/17/19 Potential to Achieve Goals: Good    Frequency Min 3X/week   Barriers to discharge        Co-evaluation               AM-PAC PT "6 Clicks" Mobility  Outcome Measure Help needed turning from your back to your side while in a flat bed without using bedrails?: A Little Help needed moving from lying on your back to sitting on the side of a flat bed without using bedrails?: A Little Help needed moving to and from a bed to a chair (including a wheelchair)?: A Little Help needed standing up from a chair using your arms (e.g., wheelchair or bedside chair)?: A Little Help needed to walk in hospital room?: A Little Help needed climbing 3-5 steps with a railing? : A Little 6 Click Score: 18    End of Session Equipment Utilized During Treatment: Gait belt Activity Tolerance: Patient limited by fatigue;Patient limited by pain Patient left: in bed;with call bell/phone within reach;with bed alarm set   PT Visit Diagnosis: History of falling (Z91.81);Pain;Muscle weakness (generalized) (M62.81)    Time: 5035-4656 PT Time Calculation (min) (ACUTE ONLY): 24 min   Charges:   PT Evaluation $PT Eval Low Complexity: 1 Low PT Treatments $Gait Training: 8-22 mins          Faye Ramsay, PT Acute Rehabilitation

## 2019-09-03 NOTE — Progress Notes (Signed)
 INFECTIOUS DISEASE PROGRESS NOTE  ID: Patrick [name] is a 44 y.o. male with  Principal Problem:   Severe sepsis with acute organ dysfunction due to methicillin susceptible Staphylococcus aureus (MSSA) (HCC) Active Problems:   Alcohol withdrawal (HCC)   MSSA bacteremia   Hypokalemia   Sepsis with acute liver failure without hepatic coma or septic shock (HCC)   Transaminitis  Subjective: C/o abd pain.  Normal BM and urine this AM.  Feels like his feet are cold.   Abtx:  Anti-infectives (From admission, onward)   Start     Dose/Rate Route Frequency Ordered Stop   08/29/19 2200  ceFAZolin (ANCEF) IVPB 2g/100 mL premix     2 g 200 mL/hr over 30 Minutes Intravenous Every 8 hours 08/29/19 1042     08/29/19 1015  ceFAZolin (ANCEF) IVPB 2g/100 mL premix     2 g 200 mL/hr over 30 Minutes Intravenous NOW 08/29/19 1003 08/29/19 1430   08/28/19 1400  ceFEPIme (MAXIPIME) 2 g in sodium chloride 0.9 % 100 mL IVPB     2 g 200 mL/hr over 30 Minutes Intravenous  Once 08/28/19 1341 08/28/19 1511   08/28/19 1345  metroNIDAZOLE (FLAGYL) IVPB 500 mg     500 mg 100 mL/hr over 60 Minutes Intravenous  Once 08/28/19 1341 08/28/19 1609   08/28/19 1345  vancomycin (VANCOCIN) IVPB 1000 mg/200 mL premix     1,000 mg 200 mL/hr over 60 Minutes Intravenous  Once 08/28/19 1341 08/28/19 1638      Medications:  Scheduled: . calcium-vitamin D  2 tablet Oral TID  . feeding supplement  1 Container Oral BID BM  . feeding supplement (PRO-STAT SUGAR FREE 64)  30 mL Oral BID  . folic acid  1 mg Oral Daily  . hepatitis A virus (PF) vaccine  1 mL Intramuscular Once  . lisinopril  5 mg Oral Daily  . magnesium oxide  800 mg Oral BID  . mouth rinse  15 mL Mouth Rinse BID  . multivitamin with minerals  1 tablet Oral Daily  . nicotine  21 mg Transdermal Daily  . pantoprazole  40 mg Oral BID  . potassium chloride  40 mEq Oral Daily  . sodium chloride flush  10-40 mL Intracatheter Q12H  . [START ON 09/04/2019]  thiamine  100 mg Oral Daily    Objective: Vital signs in last 24 hours: Temp:  [97.9 F (36.6 C)-99.9 F (37.7 C)] 99.9 F (37.7 C) (05/05 0825) Pulse Rate:  [85-100] 88 (05/05 0825) Resp:  [15-23] 18 (05/05 0825) BP: (92-154)/(68-129) 119/83 (05/05 0825) SpO2:  [89 %-99 %] 98 % (05/05 0825) Weight:  [88.2 kg] 88.2 kg (05/05 0500)   General appearance: alert, cooperative and no distress Resp: clear to auscultation bilaterally Cardio: regular rate and rhythm GI: normal findings: bowel sounds normal and abnormal findings:  distended and mild distension and mild tednerness. no r/g.  Extremities: edema none. ankles/LE warm.   Lab Results Recent Labs    09/02/19 0216 09/03/19 0544  WBC 6.4 5.4  HGB 7.2* 8.4*  HCT 22.9* 26.9*  NA 134* 135  K 3.8 3.2*  CL 104 103  CO2 22 23  BUN 7 6  CREATININE 0.72 0.75   Liver Panel Recent Labs    09/02/19 0216 09/03/19 0544  PROT 4.9* 5.9*  ALBUMIN 2.2* 2.5*  AST 155* 144*  ALT 40 39  ALKPHOS 74 83  BILITOT 2.6* 2.7*   Sedimentation Rate No results for input(s): ESRSEDRATE in the   last 72 hours. C-Reactive Protein No results for input(s): CRP in the last 72 hours.  Microbiology: Recent Results (from the past 240 hour(s))  Blood Culture (routine x 2)     Status: Abnormal   Collection Time: 08/28/19  2:12 PM   Specimen: BLOOD  Result Value Ref Range Status   Specimen Description   Final    BLOOD LEFT ANTECUBITAL Performed at [facility], [address], [place], [place] [address]    Special Requests   Final    BOTTLES DRAWN AEROBIC AND ANAEROBIC Blood Culture adequate volume Performed at [facility], [address], [place], [place] [address]    Culture  Setup Time   Final    GRAM POSITIVE COCCI IN BOTH AEROBIC AND ANAEROBIC BOTTLES CRITICAL RESULT CALLED TO, READ BACK BY AND VERIFIED WITH: E. JACKSON, PHARMD (WL) AT 6606 ON 08/29/19 BY C. JESSUP, MT. Performed at [facility], [address] [place], [place] [address]    Culture STAPHYLOCOCCUS AUREUS (A)  Final   Report Status 08/31/2019 FINAL  Final   Organism ID, Bacteria STAPHYLOCOCCUS AUREUS  Final      Susceptibility   Staphylococcus aureus - MIC*    CIPROFLOXACIN 2 INTERMEDIATE Intermediate     ERYTHROMYCIN <=0.25 SENSITIVE Sensitive     GENTAMICIN <=0.5 SENSITIVE Sensitive     OXACILLIN 0.5 SENSITIVE Sensitive     TETRACYCLINE <=1 SENSITIVE Sensitive     VANCOMYCIN <=0.5 SENSITIVE Sensitive     TRIMETH/SULFA <=10 SENSITIVE Sensitive     CLINDAMYCIN <=0.25 SENSITIVE Sensitive     RIFAMPIN <=0.5 SENSITIVE Sensitive     Inducible Clindamycin NEGATIVE Sensitive     * STAPHYLOCOCCUS AUREUS  Blood Culture ID Panel (Reflexed)     Status: Abnormal   Collection Time: 08/28/19  2:12 PM  Result Value Ref Range Status   Enterococcus species NOT DETECTED NOT DETECTED Final   Listeria monocytogenes NOT DETECTED NOT DETECTED Final   Staphylococcus species DETECTED (A) NOT DETECTED Final    Comment: CRITICAL RESULT CALLED TO, READ BACK BY AND VERIFIED WITH: E. JACKSON, PHARMD (WL) AT 1093 ON 08/29/19 BY C. JESSUP, MT.    Staphylococcus aureus (BCID) DETECTED (A) NOT DETECTED Final    Comment: Methicillin (oxacillin) susceptible Staphylococcus aureus (MSSA). Preferred therapy is anti staphylococcal beta lactam antibiotic (Cefazolin or Nafcillin), unless clinically contraindicated. CRITICAL RESULT CALLED TO, READ BACK BY AND VERIFIED WITH: E. JACKSON, PHARMD (WL) AT 2355 ON 08/29/19 BY C. JESSUP, MT.    Methicillin resistance NOT DETECTED NOT DETECTED Final   Streptococcus species NOT DETECTED NOT DETECTED Final   Streptococcus agalactiae NOT DETECTED NOT DETECTED Final   Streptococcus pneumoniae NOT DETECTED NOT DETECTED Final   Streptococcus pyogenes NOT DETECTED NOT DETECTED Final   Acinetobacter baumannii NOT DETECTED NOT DETECTED Final   Enterobacteriaceae species NOT DETECTED NOT DETECTED Final   Enterobacter  cloacae complex NOT DETECTED NOT DETECTED Final   Escherichia coli NOT DETECTED NOT DETECTED Final   Klebsiella oxytoca NOT DETECTED NOT DETECTED Final   Klebsiella pneumoniae NOT DETECTED NOT DETECTED Final   Proteus species NOT DETECTED NOT DETECTED Final   Serratia marcescens NOT DETECTED NOT DETECTED Final   Haemophilus influenzae NOT DETECTED NOT DETECTED Final   Neisseria meningitidis NOT DETECTED NOT DETECTED Final   Pseudomonas aeruginosa NOT DETECTED NOT DETECTED Final   Candida albicans NOT DETECTED NOT DETECTED Final   Candida glabrata NOT DETECTED NOT DETECTED Final   Candida krusei  NOT DETECTED NOT DETECTED Final   Candida parapsilosis NOT DETECTED NOT DETECTED Final   Candida tropicalis NOT DETECTED NOT DETECTED Final    Comment: Performed at [facility], [place] [address], [place], [place] [address]  Blood Culture (routine x 2)     Status: Abnormal   Collection Time: 08/28/19  2:27 PM   Specimen: BLOOD  Result Value Ref Range Status   Specimen Description   Final    BLOOD RIGHT ANTECUBITAL Performed at [facility] [address] [place], [place] [address]    Special Requests   Final    BOTTLES DRAWN AEROBIC AND ANAEROBIC Blood Culture adequate volume Performed at [facility] [address] [place], [place] [address]    Culture  Setup Time   Final    GRAM POSITIVE COCCI IN CLUSTERS ANAEROBIC BOTTLE ONLY CRITICAL VALUE NOTED.  VALUE IS CONSISTENT WITH PREVIOUSLY REPORTED AND CALLED VALUE.    Culture (A)  Final    STAPHYLOCOCCUS AUREUS SUSCEPTIBILITIES PERFORMED ON PREVIOUS CULTURE WITHIN THE LAST 5 DAYS. Performed at [facility], [place] [address], [place], [place] [address]    Report Status 08/31/2019 FINAL  Final  Respiratory Panel by RT PCR (Flu A&B, Covid) - Nasopharyngeal Swab     Status: None   Collection Time: 08/28/19  3:30 PM   Specimen: Nasopharyngeal Swab  Result Value Ref Range Status   SARS  Coronavirus 2 by RT PCR NEGATIVE NEGATIVE Final    Comment: (NOTE) SARS-CoV-2 target nucleic acids are NOT DETECTED. The SARS-CoV-2 RNA is generally detectable in upper respiratoy specimens during the acute phase of infection. The lowest concentration of SARS-CoV-2 viral copies this assay can detect is 131 copies/mL. A negative result does not preclude SARS-Cov-2 infection and should not be used as the sole basis for treatment or other patient management decisions. A negative result may occur with  improper specimen collection/handling, submission of specimen other than nasopharyngeal swab, presence of viral mutation(s) within the areas targeted by this assay, and inadequate number of viral copies (<131 copies/mL). A negative result must be combined with clinical observations, patient history, and epidemiological information. The expected result is Negative. Fact Sheet for Patients:  PinkCheek.be Fact Sheet for Healthcare Providers:  GravelBags.it This test is not yet ap proved or cleared by the [place] FDA and  has been authorized for detection and/or diagnosis of SARS-CoV-2 by FDA under an Emergency Use Authorization (EUA). This EUA will remain  in effect (meaning this test can be used) for the duration of the COVID-19 declaration under Section 564(b)(1) of the Act, 21 U.S.C. section 360bbb-3(b)(1), unless the authorization is terminated or revoked sooner.    Influenza A by PCR NEGATIVE NEGATIVE Final   Influenza B by PCR NEGATIVE NEGATIVE Final    Comment: (NOTE) The Xpert Xpress SARS-CoV-2/FLU/RSV assay is intended as an aid in  the diagnosis of influenza from Nasopharyngeal swab specimens and  should not be used as a sole basis for treatment. Nasal washings and  aspirates are unacceptable for Xpert Xpress SARS-CoV-2/FLU/RSV  testing. Fact Sheet for Patients: PinkCheek.be Fact Sheet  for Healthcare Providers: GravelBags.it This test is not yet approved or cleared by the [place] FDA and  has been authorized for detection and/or diagnosis of SARS-CoV-2 by  FDA under an Emergency Use Authorization (EUA). This EUA will remain  in effect (meaning this test can be used) for the duration of the  Covid-19 declaration under Section 564(b)(1) of  the Act, 21  U.S.C. section 360bbb-3(b)(1), unless the authorization is  terminated or revoked. Performed at [facility], [address], [place], [place] [address]   Urine culture     Status: None   Collection Time: 08/28/19  4:26 PM   Specimen: In/Out Cath Urine  Result Value Ref Range Status   Specimen Description   Final    IN/OUT CATH URINE Performed at [facility], [address], [place], [place] [address]    Special Requests   Final    NONE Performed at [facility], [address], [place], [place] [address]    Culture   Final    NO GROWTH Performed at [facility], [address], [place], [place] [address]    Report Status 08/29/2019 FINAL  Final  Culture, blood (Routine X 2) w Reflex to ID Panel     Status: None (Preliminary result)   Collection Time: 08/30/19  1:16 PM   Specimen: Right Antecubital; Blood  Result Value Ref Range Status   Specimen Description   Final    RIGHT ANTECUBITAL Performed at [facility], [address], [place], [place] [address]    Special Requests   Final    BOTTLES DRAWN AEROBIC AND ANAEROBIC Blood Culture adequate volume Performed at [facility], [address], [place], [place] [address]    Culture   Final    NO GROWTH 4 DAYS Performed at [facility], [address], [place], [place] [address]    Report Status PENDING  Incomplete  Culture, blood (Routine X 2) w Reflex to ID Panel     Status: None (Preliminary result)   Collection  Time: 08/30/19  1:16 PM   Specimen: BLOOD RIGHT HAND  Result Value Ref Range Status   Specimen Description   Final    BLOOD RIGHT HAND Performed at [facility], [address], [place], [place] [address]    Special Requests   Final    BOTTLES DRAWN AEROBIC ONLY Blood Culture adequate volume Performed at [facility], [address], [place], [place] [address]    Culture   Final    NO GROWTH 4 DAYS Performed at [facility], [address] [place], [place] [address]    Report Status PENDING  Incomplete  MRSA PCR Screening     Status: None   Collection Time: 08/31/19  8:17 PM   Specimen: Nasal Mucosa; Nasopharyngeal  Result Value Ref Range Status   MRSA by PCR NEGATIVE NEGATIVE Final    Comment:        The GeneXpert MRSA Assay (FDA approved for NASAL specimens only), is one component of a comprehensive MRSA colonization surveillance program. It is not intended to diagnose MRSA infection nor to guide or monitor treatment for MRSA infections. Performed at [facility], [address], [place], [place] [address]   Culture, blood (Routine X 2) w Reflex to ID Panel     Status: None (Preliminary result)   Collection Time: 09/01/19 10:01 AM   Specimen: BLOOD RIGHT HAND  Result Value Ref Range Status   Specimen Description   Final    BLOOD RIGHT HAND Performed at [facility], [address], [place], [place] [address]    Special Requests   Final    BOTTLES DRAWN AEROBIC AND ANAEROBIC Blood Culture results may not be optimal due to an inadequate volume  of blood received in culture bottles Performed at [facility], [address], [place], [place] [address]    Culture   Final    NO GROWTH 2 DAYS Performed at [facility], [address] [place], [place] [address]    Report Status PENDING  Incomplete  Culture, blood (Routine X 2) w Reflex to ID Panel     Status: None  (Preliminary result)   Collection Time: 09/01/19 10:08 AM   Specimen: BLOOD LEFT HAND  Result Value Ref Range Status   Specimen Description   Final    BLOOD LEFT HAND Performed at [facility], [address] [place], [place] [address]    Special Requests   Final    BOTTLES DRAWN AEROBIC ONLY Blood Culture results may not be optimal due to an inadequate volume of blood received in culture bottles Performed at [facility], [address] [place], [place] [address]    Culture   Final    NO GROWTH 2 DAYS Performed at [facility], [address] [place], [place] [address]    Report Status PENDING  Incomplete    Studies/Results: DG CHEST PORT 1 VIEW  Result Date: 09/02/2019 CLINICAL DATA:  Shortness of breath EXAM: PORTABLE CHEST 1 VIEW COMPARISON:  Radiograph 08/31/2019 FINDINGS: Chronic mild nonspecific elevation the right hemidiaphragm. Faint residual peripheral airspace opacities significantly decreased from comparison. No pneumothorax or effusion. The cardiomediastinal contours are unremarkable. No acute osseous or soft tissue abnormality. Telemetry leads overlie the chest. Nasal cannula projects over the upper chest. Remote posttraumatic deformity left clavicle. IMPRESSION: 1. Faint residual peripheral airspace opacities, significantly decreased from comparison. Electronically Signed   By: Kreg Shropshire M.D.   On: 09/02/2019 06:33   DG HIP UNILAT WITH PELVIS 1V RIGHT  Result Date: 09/02/2019 CLINICAL DATA:  Hip pain. EXAM: DG HIP (WITH OR WITHOUT PELVIS) 1V RIGHT COMPARISON:  CT 08/28/2019. FINDINGS: No evidence of femoral fracture or dislocation. Miniscule bony density noted adjacent to the right acetabulum, this may represent a tiny fracture fragment, age undetermined. Degenerative change could also present this fashion. IMPRESSION: No evidence of femoral fracture or dislocation. 2. Miniscule bony density noted adjacent to the right  acetabulum, this may represent a tiny fracture fragment, age undetermined. Degenerative change could also present this fashion. Electronically Signed   By: Maisie Fus  Register   On: 09/02/2019 11:03     Assessment/Plan: ETOH abuse, withdrawal MSSA sepsis Melena, Anemia Hepatitis C  Total days of antibiotics: 7 ancef  For TEE tomorrow Repeat BCx 5-1 and 5/3 are ngtd.  No change in tx until we have TEE.  Will defer to primary if he needs abd w/u?         Johny Sax MD, FACP Infectious Diseases (pager) [phone] [email or web address] 09/03/2019, 12:40 PM  LOS: 6 days

## 2019-09-03 NOTE — H&P (View-Only) (Signed)
INFECTIOUS DISEASE PROGRESS NOTE  ID: Patrick [name] is a 44 y.o. male with  Principal Problem:   Severe sepsis with acute organ dysfunction due to methicillin susceptible Staphylococcus aureus (MSSA) (HCC) Active Problems:   Alcohol withdrawal (HCC)   MSSA bacteremia   Hypokalemia   Sepsis with acute liver failure without hepatic coma or septic shock (HCC)   Transaminitis  Subjective: C/o abd pain.  Normal BM and urine this AM.  Feels like his feet are cold.   Abtx:  Anti-infectives (From admission, onward)   Start     Dose/Rate Route Frequency Ordered Stop   08/29/19 2200  ceFAZolin (ANCEF) IVPB 2g/100 mL premix     2 g 200 mL/hr over 30 Minutes Intravenous Every 8 hours 08/29/19 1042     08/29/19 1015  ceFAZolin (ANCEF) IVPB 2g/100 mL premix     2 g 200 mL/hr over 30 Minutes Intravenous NOW 08/29/19 1003 08/29/19 1430   08/28/19 1400  ceFEPIme (MAXIPIME) 2 g in sodium chloride 0.9 % 100 mL IVPB     2 g 200 mL/hr over 30 Minutes Intravenous  Once 08/28/19 1341 08/28/19 1511   08/28/19 1345  metroNIDAZOLE (FLAGYL) IVPB 500 mg     500 mg 100 mL/hr over 60 Minutes Intravenous  Once 08/28/19 1341 08/28/19 1609   08/28/19 1345  vancomycin (VANCOCIN) IVPB 1000 mg/200 mL premix     1,000 mg 200 mL/hr over 60 Minutes Intravenous  Once 08/28/19 1341 08/28/19 1638      Medications:  Scheduled: . calcium-vitamin D  2 tablet Oral TID  . feeding supplement  1 Container Oral BID BM  . feeding supplement (PRO-STAT SUGAR FREE 64)  30 mL Oral BID  . folic acid  1 mg Oral Daily  . hepatitis A virus (PF) vaccine  1 mL Intramuscular Once  . lisinopril  5 mg Oral Daily  . magnesium oxide  800 mg Oral BID  . mouth rinse  15 mL Mouth Rinse BID  . multivitamin with minerals  1 tablet Oral Daily  . nicotine  21 mg Transdermal Daily  . pantoprazole  40 mg Oral BID  . potassium chloride  40 mEq Oral Daily  . sodium chloride flush  10-40 mL Intracatheter Q12H  . [START ON 09/04/2019]  thiamine  100 mg Oral Daily    Objective: Vital signs in last 24 hours: Temp:  [97.9 F (36.6 C)-99.9 F (37.7 C)] 99.9 F (37.7 C) (05/05 0825) Pulse Rate:  [85-100] 88 (05/05 0825) Resp:  [15-23] 18 (05/05 0825) BP: (92-154)/(68-129) 119/83 (05/05 0825) SpO2:  [89 %-99 %] 98 % (05/05 0825) Weight:  [88.2 kg] 88.2 kg (05/05 0500)   General appearance: alert, cooperative and no distress Resp: clear to auscultation bilaterally Cardio: regular rate and rhythm GI: normal findings: bowel sounds normal and abnormal findings:  distended and mild distension and mild tednerness. no r/g.  Extremities: edema none. ankles/LE warm.   Lab Results Recent Labs    09/02/19 0216 09/03/19 0544  WBC 6.4 5.4  HGB 7.2* 8.4*  HCT 22.9* 26.9*  NA 134* 135  K 3.8 3.2*  CL 104 103  CO2 22 23  BUN 7 6  CREATININE 0.72 0.75   Liver Panel Recent Labs    09/02/19 0216 09/03/19 0544  PROT 4.9* 5.9*  ALBUMIN 2.2* 2.5*  AST 155* 144*  ALT 40 39  ALKPHOS 74 83  BILITOT 2.6* 2.7*   Sedimentation Rate No results for input(s): ESRSEDRATE in the  last 72 hours. C-Reactive Protein No results for input(s): CRP in the last 72 hours.  Microbiology: Recent Results (from the past 240 hour(s))  Blood Culture (routine x 2)     Status: Abnormal   Collection Time: 08/28/19  2:12 PM   Specimen: BLOOD  Result Value Ref Range Status   Specimen Description   Final    BLOOD LEFT ANTECUBITAL Performed at [facility], [address], [place], [place] [address]    Special Requests   Final    BOTTLES DRAWN AEROBIC AND ANAEROBIC Blood Culture adequate volume Performed at [facility], [address], [place], [place] [address]    Culture  Setup Time   Final    GRAM POSITIVE COCCI IN BOTH AEROBIC AND ANAEROBIC BOTTLES CRITICAL RESULT CALLED TO, READ BACK BY AND VERIFIED WITH: E. JACKSON, PHARMD (WL) AT 6606 ON 08/29/19 BY C. JESSUP, MT. Performed at [facility], [address] [place], [place] [address]    Culture STAPHYLOCOCCUS AUREUS (A)  Final   Report Status 08/31/2019 FINAL  Final   Organism ID, Bacteria STAPHYLOCOCCUS AUREUS  Final      Susceptibility   Staphylococcus aureus - MIC*    CIPROFLOXACIN 2 INTERMEDIATE Intermediate     ERYTHROMYCIN <=0.25 SENSITIVE Sensitive     GENTAMICIN <=0.5 SENSITIVE Sensitive     OXACILLIN 0.5 SENSITIVE Sensitive     TETRACYCLINE <=1 SENSITIVE Sensitive     VANCOMYCIN <=0.5 SENSITIVE Sensitive     TRIMETH/SULFA <=10 SENSITIVE Sensitive     CLINDAMYCIN <=0.25 SENSITIVE Sensitive     RIFAMPIN <=0.5 SENSITIVE Sensitive     Inducible Clindamycin NEGATIVE Sensitive     * STAPHYLOCOCCUS AUREUS  Blood Culture ID Panel (Reflexed)     Status: Abnormal   Collection Time: 08/28/19  2:12 PM  Result Value Ref Range Status   Enterococcus species NOT DETECTED NOT DETECTED Final   Listeria monocytogenes NOT DETECTED NOT DETECTED Final   Staphylococcus species DETECTED (A) NOT DETECTED Final    Comment: CRITICAL RESULT CALLED TO, READ BACK BY AND VERIFIED WITH: E. JACKSON, PHARMD (WL) AT 1093 ON 08/29/19 BY C. JESSUP, MT.    Staphylococcus aureus (BCID) DETECTED (A) NOT DETECTED Final    Comment: Methicillin (oxacillin) susceptible Staphylococcus aureus (MSSA). Preferred therapy is anti staphylococcal beta lactam antibiotic (Cefazolin or Nafcillin), unless clinically contraindicated. CRITICAL RESULT CALLED TO, READ BACK BY AND VERIFIED WITH: E. JACKSON, PHARMD (WL) AT 2355 ON 08/29/19 BY C. JESSUP, MT.    Methicillin resistance NOT DETECTED NOT DETECTED Final   Streptococcus species NOT DETECTED NOT DETECTED Final   Streptococcus agalactiae NOT DETECTED NOT DETECTED Final   Streptococcus pneumoniae NOT DETECTED NOT DETECTED Final   Streptococcus pyogenes NOT DETECTED NOT DETECTED Final   Acinetobacter baumannii NOT DETECTED NOT DETECTED Final   Enterobacteriaceae species NOT DETECTED NOT DETECTED Final   Enterobacter  cloacae complex NOT DETECTED NOT DETECTED Final   Escherichia coli NOT DETECTED NOT DETECTED Final   Klebsiella oxytoca NOT DETECTED NOT DETECTED Final   Klebsiella pneumoniae NOT DETECTED NOT DETECTED Final   Proteus species NOT DETECTED NOT DETECTED Final   Serratia marcescens NOT DETECTED NOT DETECTED Final   Haemophilus influenzae NOT DETECTED NOT DETECTED Final   Neisseria meningitidis NOT DETECTED NOT DETECTED Final   Pseudomonas aeruginosa NOT DETECTED NOT DETECTED Final   Candida albicans NOT DETECTED NOT DETECTED Final   Candida glabrata NOT DETECTED NOT DETECTED Final   Candida krusei  NOT DETECTED NOT DETECTED Final   Candida parapsilosis NOT DETECTED NOT DETECTED Final   Candida tropicalis NOT DETECTED NOT DETECTED Final    Comment: Performed at [facility], [place] [address], [place], [place] [address]  Blood Culture (routine x 2)     Status: Abnormal   Collection Time: 08/28/19  2:27 PM   Specimen: BLOOD  Result Value Ref Range Status   Specimen Description   Final    BLOOD RIGHT ANTECUBITAL Performed at [facility] [address] [place], [place] [address]    Special Requests   Final    BOTTLES DRAWN AEROBIC AND ANAEROBIC Blood Culture adequate volume Performed at [facility] [address] [place], [place] [address]    Culture  Setup Time   Final    GRAM POSITIVE COCCI IN CLUSTERS ANAEROBIC BOTTLE ONLY CRITICAL VALUE NOTED.  VALUE IS CONSISTENT WITH PREVIOUSLY REPORTED AND CALLED VALUE.    Culture (A)  Final    STAPHYLOCOCCUS AUREUS SUSCEPTIBILITIES PERFORMED ON PREVIOUS CULTURE WITHIN THE LAST 5 DAYS. Performed at [facility], [place] [address], [place], [place] [address]    Report Status 08/31/2019 FINAL  Final  Respiratory Panel by RT PCR (Flu A&B, Covid) - Nasopharyngeal Swab     Status: None   Collection Time: 08/28/19  3:30 PM   Specimen: Nasopharyngeal Swab  Result Value Ref Range Status   SARS  Coronavirus 2 by RT PCR NEGATIVE NEGATIVE Final    Comment: (NOTE) SARS-CoV-2 target nucleic acids are NOT DETECTED. The SARS-CoV-2 RNA is generally detectable in upper respiratoy specimens during the acute phase of infection. The lowest concentration of SARS-CoV-2 viral copies this assay can detect is 131 copies/mL. A negative result does not preclude SARS-Cov-2 infection and should not be used as the sole basis for treatment or other patient management decisions. A negative result may occur with  improper specimen collection/handling, submission of specimen other than nasopharyngeal swab, presence of viral mutation(s) within the areas targeted by this assay, and inadequate number of viral copies (<131 copies/mL). A negative result must be combined with clinical observations, patient history, and epidemiological information. The expected result is Negative. Fact Sheet for Patients:  PinkCheek.be Fact Sheet for Healthcare Providers:  GravelBags.it This test is not yet ap proved or cleared by the [place] FDA and  has been authorized for detection and/or diagnosis of SARS-CoV-2 by FDA under an Emergency Use Authorization (EUA). This EUA will remain  in effect (meaning this test can be used) for the duration of the COVID-19 declaration under Section 564(b)(1) of the Act, 21 U.S.C. section 360bbb-3(b)(1), unless the authorization is terminated or revoked sooner.    Influenza A by PCR NEGATIVE NEGATIVE Final   Influenza B by PCR NEGATIVE NEGATIVE Final    Comment: (NOTE) The Xpert Xpress SARS-CoV-2/FLU/RSV assay is intended as an aid in  the diagnosis of influenza from Nasopharyngeal swab specimens and  should not be used as a sole basis for treatment. Nasal washings and  aspirates are unacceptable for Xpert Xpress SARS-CoV-2/FLU/RSV  testing. Fact Sheet for Patients: PinkCheek.be Fact Sheet  for Healthcare Providers: GravelBags.it This test is not yet approved or cleared by the [place] FDA and  has been authorized for detection and/or diagnosis of SARS-CoV-2 by  FDA under an Emergency Use Authorization (EUA). This EUA will remain  in effect (meaning this test can be used) for the duration of the  Covid-19 declaration under Section 564(b)(1) of  the Act, 21  U.S.C. section 360bbb-3(b)(1), unless the authorization is  terminated or revoked. Performed at [facility], [address], [place], [place] [address]   Urine culture     Status: None   Collection Time: 08/28/19  4:26 PM   Specimen: In/Out Cath Urine  Result Value Ref Range Status   Specimen Description   Final    IN/OUT CATH URINE Performed at [facility], [address], [place], [place] [address]    Special Requests   Final    NONE Performed at [facility], [address], [place], [place] [address]    Culture   Final    NO GROWTH Performed at [facility], [address], [place], [place] [address]    Report Status 08/29/2019 FINAL  Final  Culture, blood (Routine X 2) w Reflex to ID Panel     Status: None (Preliminary result)   Collection Time: 08/30/19  1:16 PM   Specimen: Right Antecubital; Blood  Result Value Ref Range Status   Specimen Description   Final    RIGHT ANTECUBITAL Performed at [facility], [address], [place], [place] [address]    Special Requests   Final    BOTTLES DRAWN AEROBIC AND ANAEROBIC Blood Culture adequate volume Performed at [facility], [address], [place], [place] [address]    Culture   Final    NO GROWTH 4 DAYS Performed at [facility], [address], [place], [place] [address]    Report Status PENDING  Incomplete  Culture, blood (Routine X 2) w Reflex to ID Panel     Status: None (Preliminary result)   Collection  Time: 08/30/19  1:16 PM   Specimen: BLOOD RIGHT HAND  Result Value Ref Range Status   Specimen Description   Final    BLOOD RIGHT HAND Performed at [facility], [address], [place], [place] [address]    Special Requests   Final    BOTTLES DRAWN AEROBIC ONLY Blood Culture adequate volume Performed at [facility], [address], [place], [place] [address]    Culture   Final    NO GROWTH 4 DAYS Performed at [facility], [address] [place], [place] [address]    Report Status PENDING  Incomplete  MRSA PCR Screening     Status: None   Collection Time: 08/31/19  8:17 PM   Specimen: Nasal Mucosa; Nasopharyngeal  Result Value Ref Range Status   MRSA by PCR NEGATIVE NEGATIVE Final    Comment:        The GeneXpert MRSA Assay (FDA approved for NASAL specimens only), is one component of a comprehensive MRSA colonization surveillance program. It is not intended to diagnose MRSA infection nor to guide or monitor treatment for MRSA infections. Performed at [facility], [address], [place], [place] [address]   Culture, blood (Routine X 2) w Reflex to ID Panel     Status: None (Preliminary result)   Collection Time: 09/01/19 10:01 AM   Specimen: BLOOD RIGHT HAND  Result Value Ref Range Status   Specimen Description   Final    BLOOD RIGHT HAND Performed at [facility], [address], [place], [place] [address]    Special Requests   Final    BOTTLES DRAWN AEROBIC AND ANAEROBIC Blood Culture results may not be optimal due to an inadequate volume  of blood received in culture bottles Performed at [facility], [address], [place], [place] [address]    Culture   Final    NO GROWTH 2 DAYS Performed at [facility], [address] [place], [place] [address]    Report Status PENDING  Incomplete  Culture, blood (Routine X 2) w Reflex to ID Panel     Status: None  (Preliminary result)   Collection Time: 09/01/19 10:08 AM   Specimen: BLOOD LEFT HAND  Result Value Ref Range Status   Specimen Description   Final    BLOOD LEFT HAND Performed at [facility], [address] [place], [place] [address]    Special Requests   Final    BOTTLES DRAWN AEROBIC ONLY Blood Culture results may not be optimal due to an inadequate volume of blood received in culture bottles Performed at [facility], [address] [place], [place] [address]    Culture   Final    NO GROWTH 2 DAYS Performed at [facility], [address] [place], [place] [address]    Report Status PENDING  Incomplete    Studies/Results: DG CHEST PORT 1 VIEW  Result Date: 09/02/2019 CLINICAL DATA:  Shortness of breath EXAM: PORTABLE CHEST 1 VIEW COMPARISON:  Radiograph 08/31/2019 FINDINGS: Chronic mild nonspecific elevation the right hemidiaphragm. Faint residual peripheral airspace opacities significantly decreased from comparison. No pneumothorax or effusion. The cardiomediastinal contours are unremarkable. No acute osseous or soft tissue abnormality. Telemetry leads overlie the chest. Nasal cannula projects over the upper chest. Remote posttraumatic deformity left clavicle. IMPRESSION: 1. Faint residual peripheral airspace opacities, significantly decreased from comparison. Electronically Signed   By: Kreg Shropshire M.D.   On: 09/02/2019 06:33   DG HIP UNILAT WITH PELVIS 1V RIGHT  Result Date: 09/02/2019 CLINICAL DATA:  Hip pain. EXAM: DG HIP (WITH OR WITHOUT PELVIS) 1V RIGHT COMPARISON:  CT 08/28/2019. FINDINGS: No evidence of femoral fracture or dislocation. Miniscule bony density noted adjacent to the right acetabulum, this may represent a tiny fracture fragment, age undetermined. Degenerative change could also present this fashion. IMPRESSION: No evidence of femoral fracture or dislocation. 2. Miniscule bony density noted adjacent to the right  acetabulum, this may represent a tiny fracture fragment, age undetermined. Degenerative change could also present this fashion. Electronically Signed   By: Maisie Fus  Register   On: 09/02/2019 11:03     Assessment/Plan: ETOH abuse, withdrawal MSSA sepsis Melena, Anemia Hepatitis C  Total days of antibiotics: 7 ancef  For TEE tomorrow Repeat BCx 5-1 and 5/3 are ngtd.  No change in tx until we have TEE.  Will defer to primary if he needs abd w/u?         Johny Sax MD, FACP Infectious Diseases (pager) [phone] [email or web address] 09/03/2019, 12:40 PM  LOS: 6 days

## 2019-09-03 NOTE — Progress Notes (Addendum)
[facility] Progress Note  Patrick [name] 44 y.o. Sep 16, 1975  CC:  Anemia, epigastric pain, heme-positive stools  Subjective: Patient states he is not feeling well today.  He endorses pain from his torso down to his legs.  He reports epigastric pain with radiation to his back but denies nausea, vomiting, or heartburn.  He denies seeing any other melenic stools.  No stools documented in flowsheet today.  ROS : Review of Systems  Constitutional: Negative for chills and fever.  Respiratory: Negative for shortness of breath.   Cardiovascular: Negative for chest pain and palpitations.  Gastrointestinal: Positive for abdominal pain (epigastric). Negative for blood in stool, constipation, diarrhea, heartburn, melena, nausea and vomiting.    Objective: Vital signs in last 24 hours: Vitals:   09/03/19 0501 09/03/19 0825  BP: 92/79 119/83  Pulse: 90 88  Resp: 16 18  Temp: 98 F (36.7 C) 99.9 F (37.7 C)  SpO2: 97% 98%    Physical Exam:  General:  Alert, cooperative, no distress, appears stated age  Head:  Normocephalic, without obvious abnormality, atraumatic  Eyes:  Anicteric sclera, mild conjunctival pallor, EOMs intact  Lungs:   Clear to auscultation bilaterally, mild tachypnea  Heart:  Regular rate and rhythm, S1, S2 normal  Abdomen:   Soft, moderate epigastric tenderness with guarding, bowel sounds active all four quadrants,  no peritoneal signs  Extremities: Extremities without edema; diffuse bruising on torso and extremities  Pulses: 2+ and symmetric    Lab Results: Recent Labs    09/01/19 0331 09/01/19 0331 09/02/19 0216 09/03/19 0544  NA 134*   < > 134* 135  K 3.7   < > 3.8 3.2*  CL 104   < > 104 103  CO2 25   < > 22 23  GLUCOSE 91   < > 94 98  BUN 8   < > 7 6  CREATININE 0.67   < > 0.72 0.75  CALCIUM 7.2*   < > 7.6* 7.9*  MG 1.6*   < > 1.7 1.7  PHOS 3.3  --   --  2.9   < > = values in this interval not displayed.   Recent Labs    09/02/19 0216  09/03/19 0544  AST 155* 144*  ALT 40 39  ALKPHOS 74 83  BILITOT 2.6* 2.7*  PROT 4.9* 5.9*  ALBUMIN 2.2* 2.5*   Recent Labs    09/02/19 0216 09/03/19 0544  WBC 6.4 5.4  NEUTROABS  --  2.9  HGB 7.2* 8.4*  HCT 22.9* 26.9*  MCV 111.7* 111.6*  PLT 84* 148*   Recent Labs    09/03/19 0912  LABPROT 15.9*  INR 1.3*    Assessment: Anemia and heme-positive stools: Hgb 8.4 today, improved from 7.2 yesterday. No further signs of GI bleeding.Suspect recent melena and drop in hemoglobin are due to mucosal bleeding from thrombocytopenia. Patient's platelet count has improved to 148K. Renal function remains within normal limits (BUN 6/Cr 0.75)  Epigastric pain: worsening epigastric pain.  Repeat lipase ordered.  EGD will aid in further evaluation.  Thrombocytopenia: improving, platelets now at 148K (improved from 20K on 5/1)  Hepatitis C infection:  HCV Quantitative: 1,470,000.  Genotype pending.  Transaminitis: suspect this is due to heavy alcohol use.  Patient also has active Hepatitis C. LFTs improved from admission, remain stable. T bili 2.7/AST 144/ALT 39/alk phos 83 today.  Sepsis: MSSA bacteremia, undergoing TEE 5/4  Plan: EGD Friday 5/7 to further evaluate recent melena, anemia, and epigastric pain.  I thoroughly discussed the procedure, benefits, and risks (including but not limited to bleeding, infection, perforation).  Patient was given the opportunity ask questions and gave verbal consent to proceed with the procedure.  Repeat lipase ordered due to worsening epigastric pain.  Continue to monitor platelets and H&H with transfusion as needed to maintain Hgb >7.  Continue to monitor renal function and LFTs.  Patient will need treatment for Hepatitis C as an outpatient.  Continue Protonix IV BID.  [facility] will follow.  Salley Slaughter PA-C 09/03/2019, 11:32 AM  Contact #  [phone]

## 2019-09-03 NOTE — Progress Notes (Signed)
   [facility] has been requested to perform a transesophageal echocardiogram on this patient for sepsis and MSSA bacteremia. Dr. Ninetta Lights with ID has requested. Patient had marked thrombocytopenia earlier in admission but has improved. After careful review of history and examination, the risks and benefits of transesophageal echocardiogram have been explained including risks of esophageal damage, perforation (1:10,000 risk), bleeding, pharyngeal hematoma as well as other potential complications associated with conscious sedation including aspiration, arrhythmia, respiratory failure and death. Alternatives to treatment were discussed, questions were answered. Patient is willing to proceed. Procedure is scheduled tomorrow with Dr. Anne Fu at 11am. Orders written. Needs to be NPO after midnight except sips with meds.  Laurann Montana, PA-C 09/03/2019 10:44 AM

## 2019-09-03 NOTE — Progress Notes (Signed)
  Speech Language Pathology Treatment: Dysphagia  Patient Details Name: Patrick [name] MRN: [id number] DOB: [birth year] Today's Date: 09/03/2019 Time: 0076-2263 SLP Time Calculation (min) (ACUTE ONLY): 12 min  Assessment / Plan / Recommendation Clinical Impression  Pt with much improved phonatory strength and decreased dyspnea subjectively compared to 2 days prior.  RR was not in the 30s and he demonstrated no indication of aspiration with water.  He does state his mouth feels like its burning with eating/drinking.  Question if pt could benefit from an oral rinse to decrease odynophagia and encourage po. Pt admits he is using more caution with eating due to increased awareness of its importance.  All education completed re: swallowing safety. No follow up needed.    HPI HPI: 44 year old male with a past medical history significant for heavy alcohol abuse came to the [facility] long emergency room complaining of hip pain, nausea after a fall recently. Bloody emesis and bit his tongue prior to admission. Found to have sepsis secondary to MSSA bacteremia, severe hypokalemia/hypomagnesemia, sever thrombocypenia and question of GI bleed. Head CT negative. CXR Low lung volumes. Nodular opacity in the left mid lung probably. Significant coughing on juice with RN this morning.  Follow up indicated as pt's diet was advanced to assure tolerance and for pt education.      SLP Plan  Continue with current plan of care       Recommendations  Diet recommendations: Dysphagia 3 (mechanical soft);Thin liquid Liquids provided via: Cup;Straw Medication Administration: (with puree or liquid if tolerated) Supervision: Patient able to self feed Compensations: Slow rate;Small sips/bites Postural Changes and/or Swallow Maneuvers: Seated upright 90 degrees;Upright 30-60 min after meal                Oral Care Recommendations: Oral care QID Follow up Recommendations: Other (comment) SLP Visit Diagnosis: Dysphagia,  unspecified (R13.10) Plan: Continue with current plan of care       GO              Rolena Infante, MS [facility] SLP Acute Rehab Services Office [phone]   Chales Abrahams 09/03/2019, 6:47 PM

## 2019-09-03 NOTE — Progress Notes (Addendum)
PROGRESS NOTE    Patrick [name]  ZOX:[id number] DOB: 08-Aug-1975 DOA: 08/28/2019 PCP: Patient, No Pcp Per    Chief Complaint  Patient presents with  . Alcohol Intoxication  . Fatigue  . Nausea  . Hematemesis    Brief Narrative:  44 year old white male known ethanol habituation prior L2 compression fracture 12th rib fracture 08/27/2017, HTN presented to the hospital with 1 week history of generalized increasing weakness and abdominal pain-EMS apparently found him lying outside his home covered in feces, disheveled and with crusted blood around his mouth Lactic acid was 1.1 on admission T-max 101.2?  Concern for pneumonia on admission therefore started sepsis protocol coverage There was a transaminitis on admission 239/106--critical care initially consulted and ultimately admitted by hospitalist on 4/29 Found to have severe thrombocytopenia and oncology consulted Eventually found to have MSSA bacteremia   Assessment & Plan:   Principal Problem:   Severe sepsis with acute organ dysfunction due to methicillin susceptible Staphylococcus aureus (MSSA) ([place]) Active Problems:   Alcohol withdrawal ([place])   MSSA bacteremia   Hypokalemia   Sepsis with acute liver failure without hepatic coma or septic shock (HCC)   Transaminitis   MSSA bacteremia Dr. Johnnye Sima  recommending TEE as TTE EF 55 to 60% without any vegetation-this has been scheduled for 09/04/2019--continuing Ancef as per ID Right hip plain films performed on 5/5 shows no overt abscess or fluid collection although there may be a fracture We will ask therapy to see the patient  ?  Melena Was on IV PPI since taking p.o. will transition to p.o. PPI defer to GI further planning if has further bleeding we will need to formally reconsult  Hypokalemia, hypomagnesemia Replacing both today K. 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 Outpatient treatment as scheduled per infectious disease once other issues have resolved ID has decided to immunize with hepatitis a and B vaccines  Alcoholic transaminitis Trends are coming down fairly well As his trends are coming down and he does not appear to be withdrawing as per the detox protocol I will discontinue his Librium today  Hypertension Continue lisinopril 5 daily  Anemia of chronic disease with severe thrombocytopenia FOBT was positive-GI consulted recommending IV PPI they have been following periodically Platelets have improved-no reports of further bleeding   DVT prophylaxis: Lovenox Code Status: Full Family Communication: None Status is: Inpatient  Remains inpatient appropriate because:Ongoing diagnostic testing needed not appropriate for outpatient work up   Dispo: The patient is from: Unclear at this time              Anticipated d/c is to: Unclear at this time              Anticipated d/c date is: 3 days              Patient currently is not medically stable to d/c.        Consultants:   ID: Dr. Baxter Flattery 08/29/2019  Hematology/oncology Dr. Alvy Bimler 08/29/2019  Gastroenterology: Dr. Cristina Gong 08/31/2019  PCCM: Dr. Elsworth Soho 09/01/2019  Procedures:  2D echo 08/29/2019  CT head 08/29/2019  CT abdomen and pelvis 08/28/2019  Chest x-ray 08/28/2019  EEG 08/29/2019  Plain films right hip pending 09/02/2019  Antimicrobials:   IV Ancef 08/29/2019   Subjective:  Awake alert slow speech no distress eating drinking complains of overall pain from "abdomen all the way down" He seems to have had a normal stool without blood over the past 24 hours He is asking to shower He has no chest pain no fever  Objective: Vitals:   09/03/19 0102 09/03/19 0500 09/03/19 0501 09/03/19 0825  BP: 116/87  92/79 119/83  Pulse: 85  90  88  Resp: 16  16 18   Temp: 98.5 F (36.9 C)  98 F (36.7 C) 99.9 F (37.7 C)  TempSrc: Oral   Oral  SpO2: 97%  97% 98%  Weight:  88.2 kg    Height:        Intake/Output Summary (Last 24 hours) at 09/03/2019 1032 Last data filed at 09/03/2019 0900 Gross per 24 hour  Intake 820.15 ml  Output 700 ml  Net 120.15 ml   Filed Weights   09/02/19 0500 09/02/19 0916 09/03/19 0500  Weight: 86.9 kg 82.6 kg 88.2 kg    Examination:  General exam: Awake disheveled scale over face and scaling over left elbow region as well as right shin areas consistent with psoriasis EOMI NCAT no focal deficit although slow mentation Respiratory system: Clear no added sound no rales no rhonchi Cardiovascular system: S1-S2 no murmur rub or gallop telemetry shows first-degree AV block with no arrhythmia otherwise Gastrointestinal system: Abdomen is obese nontender nondistended. Central nervous system: Intact no focal deficit moving all 4 limbs without deficit power 5/5 Extremities: Rash over elbow and knee as above Skin: As above Psychiatry: Flat affect    Data Reviewed: I have personally reviewed following labs and imaging studies Potassium down from 3.8-3.2 BUN/creatinine 6/0.75 Magnesium 1.7 AST/ALT 144/39 INR 1.3 Hemoglobin 7.2-->8.4, white count 5.4 CIWA rated at 0-2   Radiology Studies: DG CHEST PORT 1 VIEW  Result Date: 09/02/2019 CLINICAL DATA:  Shortness of breath EXAM: PORTABLE CHEST 1 VIEW COMPARISON:  Radiograph 08/31/2019 FINDINGS: Chronic mild nonspecific elevation the right hemidiaphragm. Faint residual peripheral airspace opacities significantly decreased from comparison. No pneumothorax or effusion. The cardiomediastinal contours are unremarkable. No acute osseous or soft tissue abnormality. Telemetry leads overlie the chest. Nasal cannula projects over the upper chest. Remote posttraumatic deformity left clavicle. IMPRESSION: 1. Faint residual peripheral airspace opacities, significantly  decreased from comparison. Electronically Signed   By: 10/31/2019 M.D.   On: 09/02/2019 06:33   DG HIP UNILAT WITH PELVIS 1V RIGHT  Result Date: 09/02/2019 CLINICAL DATA:  Hip pain. EXAM: DG HIP (WITH OR WITHOUT PELVIS) 1V RIGHT COMPARISON:  CT 08/28/2019. FINDINGS: No evidence of femoral fracture or dislocation. Miniscule bony density noted adjacent to the right acetabulum, this may represent a tiny fracture fragment, age undetermined. Degenerative change could also present this fashion. IMPRESSION: No evidence of femoral fracture or dislocation. 2. Miniscule bony density noted adjacent to the right acetabulum, this may represent a tiny fracture fragment, age undetermined. Degenerative change could also present this fashion. Electronically Signed   By: 08/30/2019  Register   On: 09/02/2019 11:03      Scheduled Meds: . calcium-vitamin D  2 tablet Oral TID  . chlordiazePOXIDE  25 mg Oral TID   Followed by  . [START ON 09/04/2019] chlordiazePOXIDE  25 mg Oral BH-qamhs   Followed by  . [START ON 09/05/2019] chlordiazePOXIDE  25 mg Oral Daily  . feeding supplement  1 Container Oral BID BM  . feeding supplement (PRO-STAT SUGAR FREE 64)  30 mL Oral BID  . folic acid  1  mg Oral Daily  . hepatitis A virus (PF) vaccine  1 mL Intramuscular Once  . lisinopril  5 mg Oral Daily  . magnesium oxide  800 mg Oral BID  . mouth rinse  15 mL Mouth Rinse BID  . multivitamin with minerals  1 tablet Oral Daily  . nicotine  21 mg Transdermal Daily  . pantoprazole (PROTONIX) IV  40 mg Intravenous Q12H  . potassium chloride  40 mEq Oral Daily  . sodium chloride flush  10-40 mL Intracatheter Q12H  . [START ON 09/04/2019] thiamine  100 mg Oral Daily   Continuous Infusions: .  ceFAZolin (ANCEF) IV 2 g (09/03/19 0655)  . thiamine injection Stopped (09/02/19 2209)     LOS: 6 days    Time spent: 44    Rhetta Mura, MD [facility]   To contact the attending provider between 7A-7P or the covering  provider during after hours 7P-7A, please log into the web site [email or web address] and access using universal [place] password for that web site. If you do not have the password, please call the hospital operator.  09/03/2019, 10:32 AM

## 2019-09-04 ENCOUNTER — Other Ambulatory Visit: Payer: Self-pay | Admitting: Physician Assistant

## 2019-09-04 ENCOUNTER — Inpatient Hospital Stay (HOSPITAL_COMMUNITY): Payer: Self-pay

## 2019-09-04 ENCOUNTER — Inpatient Hospital Stay (HOSPITAL_COMMUNITY): Payer: Self-pay | Admitting: Certified Registered Nurse Anesthetist

## 2019-09-04 ENCOUNTER — Encounter (HOSPITAL_COMMUNITY): Payer: Self-pay | Admitting: Internal Medicine

## 2019-09-04 ENCOUNTER — Encounter (HOSPITAL_COMMUNITY)
Admission: EM | Disposition: A | Discharge status: Home / Self Care | Payer: Self-pay | Source: Home / Self Care | Attending: Internal Medicine

## 2019-09-04 DIAGNOSIS — I340 Nonrheumatic mitral (valve) insufficiency: Secondary | ICD-10-CM

## 2019-09-04 DIAGNOSIS — R7881 Bacteremia: Secondary | ICD-10-CM

## 2019-09-04 HISTORY — PX: ESOPHAGOGASTRODUODENOSCOPY (EGD) WITH PROPOFOL: SHX5813

## 2019-09-04 HISTORY — PX: BIOPSY: SHX5522

## 2019-09-04 HISTORY — PX: TEE WITHOUT CARDIOVERSION: SHX5443

## 2019-09-04 LAB — CULTURE, BLOOD (ROUTINE X 2)
Culture: NO GROWTH
Culture: NO GROWTH
Special Requests: ADEQUATE
Special Requests: ADEQUATE

## 2019-09-04 LAB — CBC WITH DIFFERENTIAL/PLATELET
Abs Immature Granulocytes: 0.29 10*3/uL — ABNORMAL HIGH (ref 0.00–0.07)
Basophils Absolute: 0 10*3/uL (ref 0.0–0.1)
Basophils Relative: 0 %
Eosinophils Absolute: 0 10*3/uL (ref 0.0–0.5)
Eosinophils Relative: 1 %
HCT: 25.1 % — ABNORMAL LOW (ref 39.0–52.0)
Hemoglobin: 7.9 g/dL — ABNORMAL LOW (ref 13.0–17.0)
Immature Granulocytes: 5 %
Lymphocytes Relative: 24 %
Lymphs Abs: 1.5 10*3/uL (ref 0.7–4.0)
MCH: 35.4 pg — ABNORMAL HIGH (ref 26.0–34.0)
MCHC: 31.5 g/dL (ref 30.0–36.0)
MCV: 112.6 fL — ABNORMAL HIGH (ref 80.0–100.0)
Monocytes Absolute: 0.5 10*3/uL (ref 0.1–1.0)
Monocytes Relative: 8 %
Neutro Abs: 4 10*3/uL (ref 1.7–7.7)
Neutrophils Relative %: 62 %
Platelets: 147 10*3/uL — ABNORMAL LOW (ref 150–400)
RBC: 2.23 MIL/uL — ABNORMAL LOW (ref 4.22–5.81)
RDW: 15.9 % — ABNORMAL HIGH (ref 11.5–15.5)
WBC: 6.3 10*3/uL (ref 4.0–10.5)
nRBC: 0.3 % — ABNORMAL HIGH (ref 0.0–0.2)

## 2019-09-04 LAB — PHOSPHORUS: Phosphorus: 3 mg/dL (ref 2.5–4.6)

## 2019-09-04 LAB — HEPATITIS C GENOTYPE

## 2019-09-04 LAB — COMPREHENSIVE METABOLIC PANEL
ALT: 30 U/L (ref 0–44)
AST: 108 U/L — ABNORMAL HIGH (ref 15–41)
Albumin: 2.2 g/dL — ABNORMAL LOW (ref 3.5–5.0)
Alkaline Phosphatase: 66 U/L (ref 38–126)
Anion gap: 9 (ref 5–15)
BUN: 6 mg/dL (ref 6–20)
CO2: 23 mmol/L (ref 22–32)
Calcium: 7.8 mg/dL — ABNORMAL LOW (ref 8.9–10.3)
Chloride: 102 mmol/L (ref 98–111)
Creatinine, Ser: 0.71 mg/dL (ref 0.61–1.24)
GFR calc Af Amer: >60 mL/min (ref >60)
GFR calc non Af Amer: >60 mL/min (ref >60)
Glucose, Bld: 89 mg/dL (ref 70–99)
Potassium: 3.5 mmol/L (ref 3.5–5.1)
Sodium: 134 mmol/L — ABNORMAL LOW (ref 135–145)
Total Bilirubin: 2 mg/dL — ABNORMAL HIGH (ref 0.3–1.2)
Total Protein: 5.1 g/dL — ABNORMAL LOW (ref 6.5–8.1)

## 2019-09-04 LAB — MAGNESIUM: Magnesium: 1.4 mg/dL — ABNORMAL LOW (ref 1.7–2.4)

## 2019-09-04 LAB — GLUCOSE, CAPILLARY: Glucose-Capillary: 103 mg/dL — ABNORMAL HIGH (ref 70–99)

## 2019-09-04 SURGERY — ECHOCARDIOGRAM, TRANSESOPHAGEAL
Anesthesia: Monitor Anesthesia Care

## 2019-09-04 MED ORDER — SACCHAROMYCES BOULARDII 250 MG PO CAPS
250.0000 mg | ORAL_CAPSULE | Freq: Two times a day (BID) | ORAL | Status: DC
  Administered 2019-09-04 – 2019-09-05 (×2): 250 mg via ORAL
  Filled 2019-09-04 (×2): qty 1

## 2019-09-04 MED ORDER — LISINOPRIL 5 MG PO TABS: 5.0000 mg | ORAL_TABLET | Freq: Every day | ORAL | Status: DC

## 2019-09-04 MED ORDER — MIDAZOLAM HCL 5 MG/5ML IJ SOLN
INTRAMUSCULAR | Status: DC | PRN
  Administered 2019-09-04: 2 mg via INTRAVENOUS

## 2019-09-04 MED ORDER — SODIUM CHLORIDE 0.9 % IV SOLN: INTRAVENOUS | Status: DC

## 2019-09-04 MED ORDER — SORBITOL 70 % SOLN
30.0000 mL | Freq: Every day | Status: DC
  Administered 2019-09-04: 30 mL via ORAL
  Filled 2019-09-04: qty 30

## 2019-09-04 MED ORDER — PROPOFOL 10 MG/ML IV BOLUS
INTRAVENOUS | Status: DC | PRN
  Administered 2019-09-04: 50 mg via INTRAVENOUS

## 2019-09-04 MED ORDER — CHLORDIAZEPOXIDE HCL 5 MG PO CAPS
5.0000 mg | ORAL_CAPSULE | Freq: Every evening | ORAL | Status: DC | PRN
  Administered 2019-09-04: 5 mg via ORAL
  Filled 2019-09-04: qty 1

## 2019-09-04 MED ORDER — MULTIVITAMIN PO LIQD: Freq: Every day | ORAL | Status: DC | PRN

## 2019-09-04 MED ORDER — MAGNESIUM SULFATE 2 GM/50ML IV SOLN
2.0000 g | Freq: Once | INTRAVENOUS | Status: AC
  Administered 2019-09-04: 2 g via INTRAVENOUS
  Filled 2019-09-04: qty 50

## 2019-09-04 MED ORDER — SUCRALFATE 1 G PO TABS
1.0000 g | ORAL_TABLET | Freq: Three times a day (TID) | ORAL | Status: DC
  Administered 2019-09-04 – 2019-09-05 (×3): 1 g via ORAL
  Filled 2019-09-04 (×4): qty 1

## 2019-09-04 MED ORDER — LACTATED RINGERS IV SOLN: INTRAVENOUS | Status: DC

## 2019-09-04 MED ORDER — PROPOFOL 500 MG/50ML IV EMUL
INTRAVENOUS | Status: DC | PRN
  Administered 2019-09-04: 200 ug/kg/min via INTRAVENOUS

## 2019-09-04 NOTE — Progress Notes (Signed)
PROGRESS NOTE    Arvine Clayburn  IDP:[id number] DOB: 1975-12-18 DOA: 08/28/2019 PCP: Patient, No Pcp Per    Brief Narrative:  44 year old white male known ethanol habituation prior L2 compression fracture 12th rib fracture 08/27/2017, HTN presented to the hospital with 1 week history of generalized increasing weakness and abdominal pain-EMS apparently found him lying outside his home covered in feces, disheveled and with crusted blood around his mouth Lactic acid was 1.1 on admission T-max 101.2?  Concern for pneumonia on admission therefore started sepsis protocol coverage There was a transaminitis on admission 239/106--critical care initially consulted and ultimately admitted by hospitalist on 4/29 Found to have severe thrombocytopenia and oncology consulted Eventually found to have MSSA bacteremia   Assessment & Plan:   Principal Problem:   Severe sepsis with acute organ dysfunction due to methicillin susceptible Staphylococcus aureus (MSSA) (HCC) Active Problems:   Alcohol withdrawal (HCC)   Bacteremia due to Staphylococcus   Hypokalemia   Sepsis with acute liver failure without hepatic coma or septic shock (HCC)   Transaminitis   MSSA bacteremia TEE no vegetation 5/6 TTE EF 55 to 60% 2-week duration-have asked the pharmacist to schedule opat--continuing Ancef as per ID Right hip plain films performed on 5/5 shows no overt abscess or fluid collection although there may be a fracture Therapy recommends as an outpatient he has some supervision-please see below  ?  Melena Was on IV PPI since taking p.o. will transition to p.o. Endoscopy 5/6 (conjunction TEE) =nl esophagus, erythematous mucosa cardia gastrum fundus-erythematous duodenopathy-recommendation = avoid EtOH, PPI X1 month twice daily  Resolving hypokalemia, hypomagnesemia Continue K. Dur 40 daily Needed to grams IV magnesium 5/6 in addition to Mag-Ox-labs a.m. 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 Ninetta Lights for outpatient coordination of hep C Rx ID has decided to immunize with hepatitis a and B vaccines Will need follow-up at [facility] in about 3 weeks post completion Ancef  Alcoholic transaminitis alcoholic liver disease CIWA  Was low and we discontinued protocol however was a little agitated overnight 5/5 thereforestart Librium at bedtime with 1 repeat overnight-hold other meds unless absolutely needed nursing aware  Hypertension Continue lisinopril 5 daily  Anemia of chronic disease with severe thrombocytopenia FOBT was positive-GI input as above no reports of further bleeding   DVT prophylaxis: Lovenox Code Status: Full Family Communication: No family is present I have been updating the patient daily he tells me that he has no family in town becomes tearful but then I recall with him that he has a girlfriend-he then states that he has dogs that need to be taken care of and his girlfriend also has dogs that need to be taken care of and cites this as an explanation as to why he cannot live with his girlfriend-I have asked him to kindly coordinate his social situation in the next 24 hours as I feel he is close to discharge-TOC will be kindly asked to help facilitate this Status is: Inpatient  Remains inpatient appropriate because:Unsafe d/c plan   Dispo: The patient is from: Unclear at this time              Anticipated d/c is to: Unclear at this time              Anticipated d/c date is: 1 day  Patient currently is not medically stable to d/c.  Consultants:   ID: Dr. Baxter Flattery 08/29/2019  Hematology/oncology Dr. Alvy Bimler 08/29/2019  Gastroenterology: Dr. Cristina Gong 08/31/2019  PCCM: Dr. Elsworth Soho 09/01/2019  Procedures:  2D echo 08/29/2019  CT head 08/29/2019  CT abdomen and pelvis  08/28/2019  Chest x-ray 08/28/2019  EEG 08/29/2019  Plain films right hip pending 09/02/2019  Antimicrobials:   IV Ancef 08/29/2019   Subjective: More coherent alert no distress EOMI Eating a sandwich without pain or discomfort-I mentioned to him that I cannot treat him for pain if I do not have a source and have discussed with him that we will not be continuing tramadol after today-I think he understands where I am coming from  Objective: Vitals:   09/04/19 1340 09/04/19 1346 09/04/19 1350 09/04/19 1432  BP:  (!) 127/96  (!) 130/99  Pulse: 86 86 84 98  Resp: 19 (!) 21 19 16   Temp:    98.2 F (36.8 C)  TempSrc:      SpO2: 96% 98% 97% 100%  Weight:      Height:        Intake/Output Summary (Last 24 hours) at 09/04/2019 1728 Last data filed at 09/04/2019 1600 Gross per 24 hour  Intake 605.25 ml  Output 150 ml  Net 455.25 ml   Filed Weights   09/02/19 0916 09/03/19 0500 09/04/19 0443  Weight: 82.6 kg 88.2 kg 86.7 kg    Examination:  General exam: Awake disheveled scale over face and scaling over left elbow region as well as right shin areas consistent with psoriasis EOMI NCAT mentation is sharper today and he is more alert oriented Respiratory system: Clear no added sound no rales no rhonchi Cardiovascular system: S1-S2 no murmur rub or gallop telemetry shows first-degree AV block with no arrhythmia otherwise Gastrointestinal system: Abdomen is obese he has absolutely no tenderness Central nervous system: Intact no focal deficit moving all 4 limbs without deficit power 5/5 Extremities: Rash over elbow and knee as above-I did not examine his hip today but yesterday on exam of the right hip he has a bruise over that area without any tenderness and range of motion is intact Skin: As above Psychiatry: Flat affect    Data Reviewed: I have personally reviewed following labs and imaging studies Potassium down from 3.8-3.2-->3.5 BUN/creatinine 6/0.75-->6/0.7 Magnesium  1.4 AST/ALT 144/39-->108/30 Hemoglobin 7.2-->8.4-->7.9, white count 5.4-->6.3 CIWA rated at 0-2   Radiology Studies: ECHO TEE  Result Date: 09/04/2019    TRANSESOPHOGEAL ECHO REPORT   Patient Name:   Muriel Baranski Date of Exam: 09/04/2019 Medical Rec #:  [id number]    Height:       70.0 in Accession #:    [id number]   Weight:       191.1 lb Date of Birth:  [birth year]     BSA:          2.048 m Patient Age:    10 years     BP:           143/97 mmHg Patient Gender: M            HR:           92 bpm. Exam Location:  Inpatient Procedure: Transesophageal Echo and Color Doppler Indications:     Bacteremia R78.81  History:         Patient has prior history of Echocardiogram examinations, most                  recent  08/29/2019. Risk Factors:Hypertension.  Sonographer:     Thurman Coyer RDCS (AE) Referring Phys:  [id number] Tacey Ruiz DUNN Diagnosing Phys: [name] MD PROCEDURE: After discussion of the risks and benefits of a TEE, an informed consent was obtained from the patient. The transesophogeal probe was passed without difficulty through the esophogus of the patient. Sedation performed by different physician. The patient was monitored while under deep sedation. Anesthestetic sedation was provided intravenously by Anesthesiology: 405.47mg  of Propofol. The patient's vital signs; including heart rate, blood pressure, and oxygen saturation; remained stable throughout the procedure. The patient developed no complications during the procedure. IMPRESSIONS  1. Left ventricular ejection fraction, by estimation, is 60 to 65%. The left ventricle has normal function. The left ventricle has no regional wall motion abnormalities.  2. Right ventricular systolic function is normal. The right ventricular size is normal.  3. No left atrial/left atrial appendage thrombus was detected.  4. The mitral valve is normal in structure. Mild mitral valve regurgitation. No evidence of mitral stenosis.  5. The aortic valve is normal in structure.  Aortic valve regurgitation is not visualized. No aortic stenosis is present.  6. The inferior vena cava is normal in size with greater than 50% respiratory variability, suggesting right atrial pressure of 3 mmHg. Conclusion(s)/Recommendation(s): Normal biventricular function without evidence of hemodynamically significant valvular heart disease. No evidence of vegetation/infective endocarditis on this transesophageal echocardiogram. FINDINGS  Left Ventricle: Left ventricular ejection fraction, by estimation, is 60 to 65%. The left ventricle has normal function. The left ventricle has no regional wall motion abnormalities. The left ventricular internal cavity size was normal in size. There is  no left ventricular hypertrophy. Right Ventricle: The right ventricular size is normal. No increase in right ventricular wall thickness. Right ventricular systolic function is normal. Left Atrium: Left atrial size was normal in size. No left atrial/left atrial appendage thrombus was detected. Right Atrium: Right atrial size was normal in size. Pericardium: There is no evidence of pericardial effusion. Mitral Valve: The mitral valve is normal in structure. Normal mobility of the mitral valve leaflets. Mild mitral valve regurgitation. No evidence of mitral valve stenosis. Tricuspid Valve: The tricuspid valve is normal in structure. Tricuspid valve regurgitation is not demonstrated. No evidence of tricuspid stenosis. Aortic Valve: The aortic valve is normal in structure. Aortic valve regurgitation is not visualized. No aortic stenosis is present. Pulmonic Valve: The pulmonic valve was normal in structure. Pulmonic valve regurgitation is not visualized. No evidence of pulmonic stenosis. Aorta: The aortic root is normal in size and structure. Venous: The inferior vena cava is normal in size with greater than 50% respiratory variability, suggesting right atrial pressure of 3 mmHg. IAS/Shunts: No atrial level shunt detected by color  flow Doppler. MD Electronically signed by [name] MD Signature Date/Time: 09/04/2019/2:14:12 PM    Final       Scheduled Meds: . calcium-vitamin D  2 tablet Oral TID  . feeding supplement  1 Container Oral BID BM  . feeding supplement (PRO-STAT SUGAR FREE 64)  30 mL Oral BID  . folic acid  1 mg Oral Daily  . hepatitis A virus (PF) vaccine  1 mL Intramuscular Once  . lisinopril  5 mg Oral Daily  . mouth rinse  15 mL Mouth Rinse BID  . multivitamin with minerals  1 tablet Oral Daily  . nicotine  21 mg Transdermal Daily  . pantoprazole  40 mg Oral BID  . potassium chloride  40 mEq Oral Daily  .  sodium chloride flush  10-40 mL Intracatheter Q12H  . sorbitol  30 mL Oral Daily  . sucralfate  1 g Oral TID WC & HS  . thiamine  100 mg Oral Daily   Continuous Infusions: .  ceFAZolin (ANCEF) IV 2 g (09/04/19 0542)     LOS: 7 days    Time spent: 25    Rhetta Mura, MD [facility]   To contact the attending provider between 7A-7P or the covering provider during after hours 7P-7A, please log into the web site [email or web address] and access using universal [place] password for that web site. If you do not have the password, please call the hospital operator.  09/04/2019, 5:28 PM

## 2019-09-04 NOTE — Progress Notes (Signed)
  Echocardiogram Echocardiogram Transesophageal has been performed.  Patrick [name] 09/04/2019, 1:24 PM

## 2019-09-04 NOTE — Interval H&P Note (Signed)
History and Physical Interval Note:  09/04/2019 11:30 AM  Patrick [name]  has presented today for surgery, with the diagnosis of BACTEREMIA.  The various methods of treatment have been discussed with the patient and family. After consideration of risks, benefits and other options for treatment, the patient has consented to  Procedure(s): TRANSESOPHAGEAL ECHOCARDIOGRAM (TEE) (N/A) as a surgical intervention.  The patient's history has been reviewed, patient examined, no change in status, stable for surgery.  I have reviewed the patient's chart and labs.  Questions were answered to the patient's satisfaction.     [employer]

## 2019-09-04 NOTE — Progress Notes (Signed)
Pharmacy Antibiotic Note  Patrick [name] is a 44 y.o. male admitted on 08/28/2019 with sepsis.  Pharmacy has been consulted for cefazolin dosing. Pt presenting with weakness, right hip pain, found lying on ground by EMS covered in feces. PMH significant for HTN, EtOH abuse. Blood culture + for MSSA, pharmacy consulted to dose cefazolin.   Today, 09/04/19  D#7  cefazolin for MSSA bacteremia -WBC WNL, SCr stable, Afebrile - TEE on 5/6  Plan:  Continue Cefazolin 2 g IV q8h  Follow renal function and culture data  F/u ID plans/recs  Height: 5\' 10"  (177.8 cm) Weight: 86.7 kg (191 lb 2.2 oz) IBW/kg (Calculated) : 73  Temp (24hrs), Avg:98.3 F (36.8 C), Min:98.2 F (36.8 C), Max:98.3 F (36.8 C)  Recent Labs  Lab 08/28/19 1412 08/28/19 1646 08/29/19 0430 08/29/19 1002 08/29/19 1033 08/29/19 1326 08/30/19 0712 08/30/19 10/30/19 08/31/19 0542 08/31/19 0542 08/31/19 2105 09/01/19 0331 09/02/19 0216 09/03/19 0544 09/04/19 0303  WBC 8.4  --    < >  --   --   --  3.8*   < > 4.0  --   --  5.3 6.4 5.4 6.3  CREATININE 1.14  --    < >  --    < >  --  0.77   < > 0.69   < > 0.66 0.67 0.72 0.75 0.71  LATICACIDVEN >11.0* 9.7*  --  3.4*  --  2.8* 2.3*  --   --   --   --   --   --   --   --    < > = values in this interval not displayed.    Estimated Creatinine Clearance: 121.7 mL/min (by C-G formula based on SCr of 0.71 mg/dL).    Allergies  Allergen Reactions  . Mushroom Extract Complex Diarrhea and Other (See Comments)    Itchy throat   Antimicrobials this admission:  Cefepime, vanc, metronidazole x1 dose in ED 4/29 Cefazolin 4/30 >>  Dose adjustments this admission:   Microbiology results:  4/29 BCx: 2/4 staph species, MSSA, no mecA detected 4/29 UCx:  NGF 5/1 BC x2: NGF 5/2 MRSA PCR: neg 5/3 BCx2: ngtd  Thank you for allowing pharmacy to be a part of this patient's care.  5/29 PharmD, BCPS Clinical Pharmacist [facility] main pharmacy [phone] 09/04/2019 9:13 AM

## 2019-09-04 NOTE — Op Note (Signed)
[facility] Patient Name: Patrick [name] Procedure Date : 09/04/2019 MRN: [id number] Attending MD: Kerin Salen , MD Date of Birth: 11/02/1975 CSN: [id number] Age: 44 Admit Type: Inpatient Procedure:                Upper GI endoscopy Indications:              Acute post hemorrhagic anemia, Melena, alcohol                            abuse and severe hepatic steatosis Providers:                Kerin Salen, MD, Zoe Lan, RN, Orvilla Fus, CRNA Referring MD:             Max Fickle Medicines:                Monitored Anesthesia Care Complications:            No immediate complications. Estimated blood loss:                            Minimal. Estimated Blood Loss:     Estimated blood loss was minimal. Procedure:                Pre-Anesthesia Assessment:                           - Prior to the procedure, a History and Physical                            was performed, and patient medications and                            allergies were reviewed. The patient's tolerance of                            previous anesthesia was also reviewed. The risks                            and benefits of the procedure and the sedation                            options and risks were discussed with the patient.                            All questions were answered, and informed consent                            was obtained. Prior Anticoagulants: The patient has                            taken no previous anticoagulant or antiplatelet                            agents. ASA Grade Assessment: III - A patient with  severe systemic disease. After reviewing the risks                            and benefits, the patient was deemed in                            satisfactory condition to undergo the procedure.                           After obtaining informed consent, the endoscope was                            passed under direct vision. Throughout the                             procedure, the patient's blood pressure, pulse, and                            oxygen saturations were monitored continuously. The                            GIF-H190 ([id number] Olympus gastroscope was                            introduced through the mouth, and advanced to the                            second part of duodenum. The upper GI endoscopy was                            accomplished without difficulty. The patient                            tolerated the procedure well. Scope In: Scope Out: Findings:      The examined esophagus was normal.      The Z-line was regular and was found 38 cm from the incisors.      Diffuse severely erythematous mucosa without bleeding was found in the       cardia, in the gastric fundus, in the gastric body, at the incisura, in       the gastric antrum, in the prepyloric region of the stomach and at the       pylorus. Biopsies were taken with a cold forceps for Helicobacter pylori       testing.      The cardia and gastric fundus were normal on retroflexion.      Diffuse moderately erythematous mucosa without active bleeding and with       no stigmata of bleeding was found in the duodenal bulb.      The first portion of the duodenum and second portion of the duodenum       were normal. Impression:               - Normal esophagus.                           - Z-line regular, 38  cm from the incisors.                           - Erythematous mucosa in the cardia, gastric                            fundus, gastric body, incisura, antrum, prepyloric                            region of the stomach and pylorus. Biopsied.                           - Erythematous duodenopathy.                           - Normal first portion of the duodenum and second                            portion of the duodenum. Moderate Sedation:      Patient did not receive moderate sedation for this procedure, but       instead received monitored anesthesia  care. Recommendation:           - Resume regular diet.                           - Continue present medications.                           - Await pathology results.                           - Use Protonix (pantoprazole) 40 mg PO BID for 1                            month.                           - Avoid alcohol. Procedure Code(s):        --- Professional ---                           [phone], Esophagogastroduodenoscopy, flexible,                            transoral; with biopsy, single or multiple Diagnosis Code(s):        --- Professional ---                           K31.89, Other diseases of stomach and duodenum                           D62, Acute posthemorrhagic anemia                           K92.1, Melena (includes Hematochezia) CPT copyright 2019 [employer]. All rights reserved. The codes documented in this report are preliminary and upon coder review may  be revised  to meet current compliance requirements. Kerin Salen, MD 09/04/2019 1:23:09 PM This report has been signed electronically. Number of Addenda: 0

## 2019-09-04 NOTE — Progress Notes (Signed)
Endo recovery care complete. Awaiting [facility] transport.

## 2019-09-04 NOTE — CV Procedure (Signed)
   Transesophageal Echocardiogram  Indications: Bacteremia  Time out performed  Anesthesia propofol  Findings:  Left Ventricle: Normal EF  Mitral Valve: Trace to mild MR  Aortic Valve: Normal  Tricuspid Valve: Normal  Left Atrium: Normal, no LAA thrombus  IMPRESSION: NO ENDOCARDITIS, NO VEGETATIONS  [name], MD

## 2019-09-04 NOTE — Anesthesia Preprocedure Evaluation (Signed)
Anesthesia Evaluation  Patient identified by MRN, date of birth, ID band Patient awake    Reviewed: Allergy & Precautions, NPO status , Patient's Chart, lab work & pertinent test results  Airway Mallampati: II  TM Distance: >3 FB Neck ROM: Full    Dental  (+) Loose, Missing, Poor Dentition, Dental Advisory Given   Pulmonary Current Smoker,    Pulmonary exam normal breath sounds clear to auscultation       Cardiovascular hypertension, Pt. on medications Normal cardiovascular exam Rhythm:Regular Rate:Normal     Neuro/Psych  Headaches, Seizures -,  PSYCHIATRIC DISORDERS    GI/Hepatic negative GI ROS, Neg liver ROS,   Endo/Other  negative endocrine ROS  Renal/GU negative Renal ROS     Musculoskeletal negative musculoskeletal ROS (+)   Abdominal (+) - obese,   Peds  Hematology negative hematology ROS (+)   Anesthesia Other Findings   Reproductive/Obstetrics                             Anesthesia Physical Anesthesia Plan  ASA: II  Anesthesia Plan: MAC   Post-op Pain Management:    Induction: Intravenous  PONV Risk Score and Plan: 0 and Propofol infusion, TIVA and Treatment may vary due to age or medical condition  Airway Management Planned: Natural Airway  Additional Equipment:   Intra-op Plan:   Post-operative Plan:   Informed Consent: I have reviewed the patients History and Physical, chart, labs and discussed the procedure including the risks, benefits and alternatives for the proposed anesthesia with the patient or authorized representative who has indicated his/her understanding and acceptance.     Dental advisory given  Plan Discussed with: CRNA  Anesthesia Plan Comments:         Anesthesia Quick Evaluation

## 2019-09-04 NOTE — Progress Notes (Signed)
TEE noted He needs 2 weeks of ancef.  Please let us know if further questions.  thanks

## 2019-09-04 NOTE — Progress Notes (Signed)
[facility] Progress Note  Patrick [name] 44 y.o. 1976-01-11  CC:  Anemia, abdominal pain, heme-positive stools  Subjective: Patient states he is having pain from his lower abdomen into his legs.  He denies nausea or vomiting.  He is NPO for TEE today and reports that he is hungry.  He has not seen any melenic or bloody stools over the past couple of days.  ROS : Review of Systems  Respiratory: Negative for cough and shortness of breath.   Cardiovascular: Negative for chest pain and palpitations.  Gastrointestinal: Positive for abdominal pain. Negative for blood in stool, constipation, diarrhea, heartburn, melena, nausea and vomiting.    Objective: Vital signs in last 24 hours: Vitals:   09/03/19 1936 09/04/19 0443  BP: (!) 130/95 (!) 130/95  Pulse: 100 87  Resp: 18 19  Temp: 98.2 F (36.8 C) 98.3 F (36.8 C)  SpO2: 99% 99%    Physical Exam:  General:  Sleeping but easily arouses to voice alone, oriented, cooperative, no acute distress  Head:  Normocephalic, without obvious abnormality, atraumatic  Eyes:  Anicteric sclera, EOMs intact  Lungs:   Clear to auscultation bilaterally, mild tachypnea  Heart:  Regular rate and rhythm, S1, S2 normal  Abdomen:   Soft but with moderate diffuse tenderness with guarding, bowel sounds active all four quadrants,  no peritoneal signs  Extremities: Diffuse bruising on extremities, no  edema  Pulses: 2+ and symmetric    Lab Results: Recent Labs    09/03/19 0544 09/04/19 0303  NA 135 134*  K 3.2* 3.5  CL 103 102  CO2 23 23  GLUCOSE 98 89  BUN 6 6  CREATININE 0.75 0.71  CALCIUM 7.9* 7.8*  MG 1.7 1.4*  PHOS 2.9 3.0   Recent Labs    09/03/19 0544 09/04/19 0303  AST 144* 108*  ALT 39 30  ALKPHOS 83 66  BILITOT 2.7* 2.0*  PROT 5.9* 5.1*  ALBUMIN 2.5* 2.2*   Recent Labs    09/03/19 0544 09/04/19 0303  WBC 5.4 6.3  NEUTROABS 2.9 4.0  HGB 8.4* 7.9*  HCT 26.9* 25.1*  MCV 111.6* 112.6*  PLT 148* 147*   Recent  Labs    09/03/19 0912  LABPROT 15.9*  INR 1.3*    Assessment: Anemia and heme-positive stools: Hgb 7.9 today, mildly decreased from 8.4 yesterday.No further signs of GI bleeding. Patient's platelet count is stable at 147K. Renal function remains within normal limits (BUN6/Cr 0.71)  Abdominal pain: Repeat lipase of 91 U/L (67 on arrival).  CT 08/28/19 showed no acute abdominopelvic findings.    Thrombocytopenia: improving, platelets now at147K(improved from 20K on 5/1)  Hepatitis C infection: HCV Quantitative: 1,470,000. Genotype pending.  Transaminitis: suspect this is due to heavy alcohol use.Patient also has active Hepatitis C.LFTs continue to improve. T bili 2.0/AST 108/ALT 30/alk [id number].  Sepsis: MSSA bacteremia, undergoing TEE today 5/6  Plan: EGD tomorrow 5/7 to further evaluate recent melena, anemia, and abdominal pain, as well as rule out esophageal varices.  NPO at midnight.  Continue to monitor platelets and H&H with transfusion as needed to maintain Hgb >7.  Continue to monitor renal function and LFTs.  Patient will need treatment for Hepatitis C as an outpatient.  Continue Protonix IV BID.  [facility] will follow.  Salley Slaughter PA-C 09/04/2019, 8:55 AM  Contact #  [phone]

## 2019-09-04 NOTE — Progress Notes (Signed)
INFECTIOUS DISEASE PROGRESS NOTE  ID: Patrick [name] is a 44 y.o. male with  Principal Problem:   Severe sepsis with acute organ dysfunction due to methicillin susceptible Staphylococcus aureus (MSSA) (HCC) Active Problems:   Alcohol withdrawal (HCC)   Bacteremia due to Staphylococcus   Hypokalemia   Sepsis with acute liver failure without hepatic coma or septic shock (HCC)   Transaminitis  Subjective: Resting quietly.   Abtx:  Anti-infectives (From admission, onward)   Start     Dose/Rate Route Frequency Ordered Stop   08/29/19 2200  ceFAZolin (ANCEF) IVPB 2g/100 mL premix     2 g 200 mL/hr over 30 Minutes Intravenous Every 8 hours 08/29/19 1042     08/29/19 1015  ceFAZolin (ANCEF) IVPB 2g/100 mL premix     2 g 200 mL/hr over 30 Minutes Intravenous NOW 08/29/19 1003 08/29/19 1430   08/28/19 1400  ceFEPIme (MAXIPIME) 2 g in sodium chloride 0.9 % 100 mL IVPB     2 g 200 mL/hr over 30 Minutes Intravenous  Once 08/28/19 1341 08/28/19 1511   08/28/19 1345  metroNIDAZOLE (FLAGYL) IVPB 500 mg     500 mg 100 mL/hr over 60 Minutes Intravenous  Once 08/28/19 1341 08/28/19 1609   08/28/19 1345  vancomycin (VANCOCIN) IVPB 1000 mg/200 mL premix     1,000 mg 200 mL/hr over 60 Minutes Intravenous  Once 08/28/19 1341 08/28/19 1638      Medications:  Scheduled: . calcium-vitamin D  2 tablet Oral TID  . feeding supplement  1 Container Oral BID BM  . feeding supplement (PRO-STAT SUGAR FREE 64)  30 mL Oral BID  . folic acid  1 mg Oral Daily  . hepatitis A virus (PF) vaccine  1 mL Intramuscular Once  . lisinopril  5 mg Oral Daily  . mouth rinse  15 mL Mouth Rinse BID  . multivitamin with minerals  1 tablet Oral Daily  . nicotine  21 mg Transdermal Daily  . pantoprazole  40 mg Oral BID  . potassium chloride  40 mEq Oral Daily  . sodium chloride flush  10-40 mL Intracatheter Q12H  . thiamine  100 mg Oral Daily    Objective: Vital signs in last 24 hours: Temp:  [98.2 F (36.8  C)-99.9 F (37.7 C)] 98.3 F (36.8 C) (05/06 0443) Pulse Rate:  [87-100] 87 (05/06 0443) Resp:  [18-19] 19 (05/06 0443) BP: (119-130)/(83-95) 130/95 (05/06 0443) SpO2:  [98 %-99 %] 99 % (05/06 0443) Weight:  [86.7 kg] 86.7 kg (05/06 0443)   General appearance: no distress Resp: clear to auscultation bilaterally Cardio: regular rate and rhythm GI: normal findings: bowel sounds normal and soft, non-tender  Lab Results Recent Labs    09/03/19 0544 09/04/19 0303  WBC 5.4 6.3  HGB 8.4* 7.9*  HCT 26.9* 25.1*  NA 135 134*  K 3.2* 3.5  CL 103 102  CO2 23 23  BUN 6 6  CREATININE 0.75 0.71   Liver Panel Recent Labs    09/03/19 0544 09/04/19 0303  PROT 5.9* 5.1*  ALBUMIN 2.5* 2.2*  AST 144* 108*  ALT 39 30  ALKPHOS 83 66  BILITOT 2.7* 2.0*   Sedimentation Rate No results for input(s): ESRSEDRATE in the last 72 hours. C-Reactive Protein No results for input(s): CRP in the last 72 hours.  Microbiology: Recent Results (from the past 240 hour(s))  Blood Culture (routine x 2)     Status: Abnormal   Collection Time: 08/28/19  2:12 PM  Specimen: BLOOD  Result Value Ref Range Status   Specimen Description   Final    BLOOD LEFT ANTECUBITAL Performed at [facility], [address], [place], [place] [address]    Special Requests   Final    BOTTLES DRAWN AEROBIC AND ANAEROBIC Blood Culture adequate volume Performed at [facility], [address], [place], [place] [address]    Culture  Setup Time   Final    GRAM POSITIVE COCCI IN BOTH AEROBIC AND ANAEROBIC BOTTLES CRITICAL RESULT CALLED TO, READ BACK BY AND VERIFIED WITH: E. JACKSON, PHARMD (WL) AT 6811 ON 08/29/19 BY C. JESSUP, MT. Performed at [facility], [address] [place], [place] [address]    Culture STAPHYLOCOCCUS AUREUS (A)  Final   Report Status 08/31/2019 FINAL  Final   Organism ID, Bacteria STAPHYLOCOCCUS AUREUS  Final      Susceptibility   Staphylococcus aureus - MIC*      CIPROFLOXACIN 2 INTERMEDIATE Intermediate     ERYTHROMYCIN <=0.25 SENSITIVE Sensitive     GENTAMICIN <=0.5 SENSITIVE Sensitive     OXACILLIN 0.5 SENSITIVE Sensitive     TETRACYCLINE <=1 SENSITIVE Sensitive     VANCOMYCIN <=0.5 SENSITIVE Sensitive     TRIMETH/SULFA <=10 SENSITIVE Sensitive     CLINDAMYCIN <=0.25 SENSITIVE Sensitive     RIFAMPIN <=0.5 SENSITIVE Sensitive     Inducible Clindamycin NEGATIVE Sensitive     * STAPHYLOCOCCUS AUREUS  Blood Culture ID Panel (Reflexed)     Status: Abnormal   Collection Time: 08/28/19  2:12 PM  Result Value Ref Range Status   Enterococcus species NOT DETECTED NOT DETECTED Final   Listeria monocytogenes NOT DETECTED NOT DETECTED Final   Staphylococcus species DETECTED (A) NOT DETECTED Final    Comment: CRITICAL RESULT CALLED TO, READ BACK BY AND VERIFIED WITH: E. JACKSON, PHARMD (WL) AT 0355 ON 08/29/19 BY C. JESSUP, MT.    Staphylococcus aureus (BCID) DETECTED (A) NOT DETECTED Final    Comment: Methicillin (oxacillin) susceptible Staphylococcus aureus (MSSA). Preferred therapy is anti staphylococcal beta lactam antibiotic (Cefazolin or Nafcillin), unless clinically contraindicated. CRITICAL RESULT CALLED TO, READ BACK BY AND VERIFIED WITH: E. JACKSON, PHARMD (WL) AT 9741 ON 08/29/19 BY C. JESSUP, MT.    Methicillin resistance NOT DETECTED NOT DETECTED Final   Streptococcus species NOT DETECTED NOT DETECTED Final   Streptococcus agalactiae NOT DETECTED NOT DETECTED Final   Streptococcus pneumoniae NOT DETECTED NOT DETECTED Final   Streptococcus pyogenes NOT DETECTED NOT DETECTED Final   Acinetobacter baumannii NOT DETECTED NOT DETECTED Final   Enterobacteriaceae species NOT DETECTED NOT DETECTED Final   Enterobacter cloacae complex NOT DETECTED NOT DETECTED Final   Escherichia coli NOT DETECTED NOT DETECTED Final   Klebsiella oxytoca NOT DETECTED NOT DETECTED Final   Klebsiella pneumoniae NOT DETECTED NOT DETECTED Final   Proteus species NOT  DETECTED NOT DETECTED Final   Serratia marcescens NOT DETECTED NOT DETECTED Final   Haemophilus influenzae NOT DETECTED NOT DETECTED Final   Neisseria meningitidis NOT DETECTED NOT DETECTED Final   Pseudomonas aeruginosa NOT DETECTED NOT DETECTED Final   Candida albicans NOT DETECTED NOT DETECTED Final   Candida glabrata NOT DETECTED NOT DETECTED Final   Candida krusei NOT DETECTED NOT DETECTED Final   Candida parapsilosis NOT DETECTED NOT DETECTED Final   Candida tropicalis NOT DETECTED NOT DETECTED Final    Comment: Performed at [facility], [address], [place], [place] [address]  Blood Culture (routine x  2)     Status: Abnormal   Collection Time: 08/28/19  2:27 PM   Specimen: BLOOD  Result Value Ref Range Status   Specimen Description   Final    BLOOD RIGHT ANTECUBITAL Performed at [facility] [address], [place], [place] [address]    Special Requests   Final    BOTTLES DRAWN AEROBIC AND ANAEROBIC Blood Culture adequate volume Performed at [facility] [address], [place], [place] [address]    Culture  Setup Time   Final    GRAM POSITIVE COCCI IN CLUSTERS ANAEROBIC BOTTLE ONLY CRITICAL VALUE NOTED.  VALUE IS CONSISTENT WITH PREVIOUSLY REPORTED AND CALLED VALUE.    Culture (A)  Final    STAPHYLOCOCCUS AUREUS SUSCEPTIBILITIES PERFORMED ON PREVIOUS CULTURE WITHIN THE LAST 5 DAYS. Performed at [facility], [place] [address] [place], [place] [address]    Report Status 08/31/2019 FINAL  Final  Respiratory Panel by RT PCR (Flu A&B, Covid) - Nasopharyngeal Swab     Status: None   Collection Time: 08/28/19  3:30 PM   Specimen: Nasopharyngeal Swab  Result Value Ref Range Status   SARS Coronavirus 2 by RT PCR NEGATIVE NEGATIVE Final    Comment: (NOTE) SARS-CoV-2 target nucleic acids are NOT DETECTED. The SARS-CoV-2 RNA is generally detectable in upper respiratoy specimens during the acute phase of infection. The  lowest concentration of SARS-CoV-2 viral copies this assay can detect is 131 copies/mL. A negative result does not preclude SARS-Cov-2 infection and should not be used as the sole basis for treatment or other patient management decisions. A negative result may occur with  improper specimen collection/handling, submission of specimen other than nasopharyngeal swab, presence of viral mutation(s) within the areas targeted by this assay, and inadequate number of viral copies (<131 copies/mL). A negative result must be combined with clinical observations, patient history, and epidemiological information. The expected result is Negative. Fact Sheet for Patients:  PinkCheek.be Fact Sheet for Healthcare Providers:  GravelBags.it This test is not yet ap proved or cleared by the [place] FDA and  has been authorized for detection and/or diagnosis of SARS-CoV-2 by FDA under an Emergency Use Authorization (EUA). This EUA will remain  in effect (meaning this test can be used) for the duration of the COVID-19 declaration under Section 564(b)(1) of the Act, 21 U.S.C. section 360bbb-3(b)(1), unless the authorization is terminated or revoked sooner.    Influenza A by PCR NEGATIVE NEGATIVE Final   Influenza B by PCR NEGATIVE NEGATIVE Final    Comment: (NOTE) The Xpert Xpress SARS-CoV-2/FLU/RSV assay is intended as an aid in  the diagnosis of influenza from Nasopharyngeal swab specimens and  should not be used as a sole basis for treatment. Nasal washings and  aspirates are unacceptable for Xpert Xpress SARS-CoV-2/FLU/RSV  testing. Fact Sheet for Patients: PinkCheek.be Fact Sheet for Healthcare Providers: GravelBags.it This test is not yet approved or cleared by the [place] FDA and  has been authorized for detection and/or diagnosis of SARS-CoV-2 by  FDA under an Emergency  Use Authorization (EUA). This EUA will remain  in effect (meaning this test can be used) for the duration of the  Covid-19 declaration under Section 564(b)(1) of the Act, 21  U.S.C. section 360bbb-3(b)(1), unless the authorization is  terminated or revoked. Performed at [facility], [place] [address], [place], [place] [address]   Urine culture     Status: None   Collection Time: 08/28/19  4:26  PM   Specimen: In/Out Cath Urine  Result Value Ref Range Status   Specimen Description   Final    IN/OUT CATH URINE Performed at [facility], [address], [place], [place] [address]    Special Requests   Final    NONE Performed at [facility], [address], [place], [place] [address]    Culture   Final    NO GROWTH Performed at [facility], [address], [place], [place] [address]    Report Status 08/29/2019 FINAL  Final  Culture, blood (Routine X 2) w Reflex to ID Panel     Status: None   Collection Time: 08/30/19  1:16 PM   Specimen: Right Antecubital; Blood  Result Value Ref Range Status   Specimen Description   Final    RIGHT ANTECUBITAL Performed at [facility], [address] [place], [place] [address]    Special Requests   Final    BOTTLES DRAWN AEROBIC AND ANAEROBIC Blood Culture adequate volume Performed at [facility], [address], [place], [place] [address]    Culture   Final    NO GROWTH 5 DAYS Performed at [facility], [address], [place], [place] [address]    Report Status 09/04/2019 FINAL  Final  Culture, blood (Routine X 2) w Reflex to ID Panel     Status: None   Collection Time: 08/30/19  1:16 PM   Specimen: BLOOD RIGHT HAND  Result Value Ref Range Status   Specimen Description   Final    BLOOD RIGHT HAND Performed at [facility], [address], [place], [place] [address]    Special Requests   Final    BOTTLES DRAWN  AEROBIC ONLY Blood Culture adequate volume Performed at [facility], [address], [place], [place] [address]    Culture   Final    NO GROWTH 5 DAYS Performed at [facility], [address] [place], [place] [address]    Report Status 09/04/2019 FINAL  Final  MRSA PCR Screening     Status: None   Collection Time: 08/31/19  8:17 PM   Specimen: Nasal Mucosa; Nasopharyngeal  Result Value Ref Range Status   MRSA by PCR NEGATIVE NEGATIVE Final    Comment:        The GeneXpert MRSA Assay (FDA approved for NASAL specimens only), is one component of a comprehensive MRSA colonization surveillance program. It is not intended to diagnose MRSA infection nor to guide or monitor treatment for MRSA infections. Performed at [facility], [address] [place], [place] [address]   Culture, blood (Routine X 2) w Reflex to ID Panel     Status: None (Preliminary result)   Collection Time: 09/01/19 10:01 AM   Specimen: BLOOD RIGHT HAND  Result Value Ref Range Status   Specimen Description   Final    BLOOD RIGHT HAND Performed at [facility], [address] [place], [place] [address]    Special Requests   Final    BOTTLES DRAWN AEROBIC AND ANAEROBIC Blood Culture results may not be optimal due to an inadequate volume of blood received in culture bottles Performed at [facility], [address], [place], [place] [address]    Culture   Final    NO GROWTH 3 DAYS Performed at [facility], [address], [place], [place] [address]  Report Status PENDING  Incomplete  Culture, blood (Routine X 2) w Reflex to ID Panel     Status: None (Preliminary result)   Collection Time: 09/01/19 10:08 AM   Specimen: BLOOD LEFT HAND  Result Value Ref Range Status   Specimen Description   Final    BLOOD LEFT HAND Performed at [facility], [address] [place], [place] [address]    Special  Requests   Final    BOTTLES DRAWN AEROBIC ONLY Blood Culture results may not be optimal due to an inadequate volume of blood received in culture bottles Performed at [facility], [address] [place], [place] [address]    Culture   Final    NO GROWTH 3 DAYS Performed at [facility], [address] [place], [place] [address]    Report Status PENDING  Incomplete    Studies/Results: DG HIP UNILAT WITH PELVIS 1V RIGHT  Result Date: 09/02/2019 CLINICAL DATA:  Hip pain. EXAM: DG HIP (WITH OR WITHOUT PELVIS) 1V RIGHT COMPARISON:  CT 08/28/2019. FINDINGS: No evidence of femoral fracture or dislocation. Miniscule bony density noted adjacent to the right acetabulum, this may represent a tiny fracture fragment, age undetermined. Degenerative change could also present this fashion. IMPRESSION: No evidence of femoral fracture or dislocation. 2. Miniscule bony density noted adjacent to the right acetabulum, this may represent a tiny fracture fragment, age undetermined. Degenerative change could also present this fashion. Electronically Signed   By: Maisie Fushomas  Register   On: 09/02/2019 11:03     Assessment/Plan: ETOH abuse, withdrawal MSSA sepsis Melena, Anemia Hepatitis C (RNA 1.47 million, genotype pending)  Total days of antibiotics:8 ancef  For TEE today Repeat BCx 5-1 (-) and 5/3 are ngtd.  No change in tx until we have TEE.  Endoscopy Friday for melena, anemia.          Johny SaxJeffrey Shakhia Gramajo MD, FACP Infectious Diseases (pager) [phone] [email or web address] 09/04/2019, 6:56 AM  LOS: 7 days

## 2019-09-04 NOTE — Transfer of Care (Signed)
Immediate Anesthesia Transfer of Care Note  Patient: Patrick [name]  Procedure(s) Performed: TRANSESOPHAGEAL ECHOCARDIOGRAM (TEE) (N/A ) ESOPHAGOGASTRODUODENOSCOPY (EGD) WITH PROPOFOL  Patient Location: Endoscopy Unit  Anesthesia Type:MAC  Level of Consciousness: drowsy  Airway & Oxygen Therapy: Patient Spontanous Breathing and Patient connected to nasal cannula oxygen  Post-op Assessment: Report given to RN and Post -op Vital signs reviewed and stable  Post vital signs: Reviewed and stable  Last Vitals:  Vitals Value Taken Time  BP 78/48 09/04/19 1321  Temp    Pulse 90 09/04/19 1322  Resp 13 09/04/19 1322  SpO2 93 % 09/04/19 1322  Vitals shown include unvalidated device data.  Last Pain:  Vitals:   09/04/19 1100  TempSrc: Temporal  PainSc: 9       Patients Stated Pain Goal: 0 (97/28/20 6015)  Complications: No apparent anesthesia complications

## 2019-09-04 NOTE — Anesthesia Procedure Notes (Signed)
Procedure Name: MAC Date/Time: 09/04/2019 12:44 PM Performed by: Leonor Liv, CRNA Pre-anesthesia Checklist: Patient identified, Emergency Drugs available, Suction available, Timeout performed and Patient being monitored Patient Re-evaluated:Patient Re-evaluated prior to induction Oxygen Delivery Method: Nasal cannula Preoxygenation: Pre-oxygenation with 100% oxygen Airway Equipment and Method: Bite block Placement Confirmation: positive ETCO2 Dental Injury: Teeth and Oropharynx as per pre-operative assessment

## 2019-09-04 NOTE — Brief Op Note (Signed)
08/28/2019 - 09/04/2019  1:16 PM  PATIENT:  Patrick [name]  44 y.o. male  PRE-OPERATIVE DIAGNOSIS:  BACTEREMIA  POST-OPERATIVE DIAGNOSIS:  * No post-op diagnosis entered *  PROCEDURE:  Procedure(s): TRANSESOPHAGEAL ECHOCARDIOGRAM (TEE) (N/A) ESOPHAGOGASTRODUODENOSCOPY (EGD) WITH PROPOFOL  SURGEON:  Surgeon(s) and Role:    * Skains, Thana Farr, MD - Primary    * Ronnette Juniper, MD - Assisting  PHYSICIAN ASSISTANT:   ASSISTANTS: Carlyn Reichert, RN  ANESTHESIA:   MAC  EBL:  Minimal  BLOOD ADMINISTERED:none  DRAINS: none   LOCAL MEDICATIONS USED:  NONE  SPECIMEN:  Biopsy / Limited Resection  DISPOSITION OF SPECIMEN:  PATHOLOGY  COUNTS:  YES  TOURNIQUET:  * No tourniquets in log *  DICTATION: .Dragon Dictation  PLAN OF CARE: Admit to inpatient   PATIENT DISPOSITION:  PACU - hemodynamically stable.   Delay start of Pharmacological VTE agent (>24hrs) due to surgical blood loss or risk of bleeding: not applicable

## 2019-09-05 LAB — CBC WITH DIFFERENTIAL/PLATELET
Abs Immature Granulocytes: 0.06 10*3/uL (ref 0.00–0.07)
Basophils Absolute: 0 10*3/uL (ref 0.0–0.1)
Basophils Relative: 0 %
Eosinophils Absolute: 0 10*3/uL (ref 0.0–0.5)
Eosinophils Relative: 0 %
HCT: 23.1 % — ABNORMAL LOW (ref 39.0–52.0)
Hemoglobin: 7.2 g/dL — ABNORMAL LOW (ref 13.0–17.0)
Immature Granulocytes: 1 %
Lymphocytes Relative: 19 %
Lymphs Abs: 1.3 10*3/uL (ref 0.7–4.0)
MCH: 34.8 pg — ABNORMAL HIGH (ref 26.0–34.0)
MCHC: 31.2 g/dL (ref 30.0–36.0)
MCV: 111.6 fL — ABNORMAL HIGH (ref 80.0–100.0)
Monocytes Absolute: 0.4 10*3/uL (ref 0.1–1.0)
Monocytes Relative: 7 %
Neutro Abs: 4.8 10*3/uL (ref 1.7–7.7)
Neutrophils Relative %: 73 %
Platelets: 150 10*3/uL (ref 150–400)
RBC: 2.07 MIL/uL — ABNORMAL LOW (ref 4.22–5.81)
RDW: 15.9 % — ABNORMAL HIGH (ref 11.5–15.5)
WBC: 6.6 10*3/uL (ref 4.0–10.5)
nRBC: 0 % (ref 0.0–0.2)

## 2019-09-05 LAB — COMPREHENSIVE METABOLIC PANEL
ALT: 25 U/L (ref 0–44)
AST: 89 U/L — ABNORMAL HIGH (ref 15–41)
Albumin: 2.2 g/dL — ABNORMAL LOW (ref 3.5–5.0)
Alkaline Phosphatase: 70 U/L (ref 38–126)
Anion gap: 8 (ref 5–15)
BUN: 5 mg/dL — ABNORMAL LOW (ref 6–20)
CO2: 23 mmol/L (ref 22–32)
Calcium: 7.8 mg/dL — ABNORMAL LOW (ref 8.9–10.3)
Chloride: 102 mmol/L (ref 98–111)
Creatinine, Ser: 0.68 mg/dL (ref 0.61–1.24)
GFR calc Af Amer: >60 mL/min (ref >60)
GFR calc non Af Amer: >60 mL/min (ref >60)
Glucose, Bld: 110 mg/dL — ABNORMAL HIGH (ref 70–99)
Potassium: 3.5 mmol/L (ref 3.5–5.1)
Sodium: 133 mmol/L — ABNORMAL LOW (ref 135–145)
Total Bilirubin: 1.8 mg/dL — ABNORMAL HIGH (ref 0.3–1.2)
Total Protein: 5.2 g/dL — ABNORMAL LOW (ref 6.5–8.1)

## 2019-09-05 LAB — GLUCOSE, CAPILLARY: Glucose-Capillary: 111 mg/dL — ABNORMAL HIGH (ref 70–99)

## 2019-09-05 LAB — SURGICAL PATHOLOGY

## 2019-09-05 SURGERY — EGD (ESOPHAGOGASTRODUODENOSCOPY)
Anesthesia: Monitor Anesthesia Care

## 2019-09-05 MED ORDER — SUCRALFATE 1 G PO TABS: 1.0000 g | ORAL_TABLET | Freq: Three times a day (TID) | ORAL | 0 refills | Status: DC

## 2019-09-05 MED ORDER — ORITAVANCIN DIPHOSPHATE 400 MG IV SOLR
1200.0000 mg | Freq: Once | INTRAVENOUS | Status: AC
  Administered 2019-09-05: 1200 mg via INTRAVENOUS
  Filled 2019-09-05: qty 120

## 2019-09-05 MED ORDER — PANTOPRAZOLE SODIUM 40 MG PO TBEC: 40.0000 mg | DELAYED_RELEASE_TABLET | Freq: Two times a day (BID) | ORAL | 3 refills | Status: DC

## 2019-09-05 MED ORDER — NICOTINE 21 MG/24HR TD PT24: 21.0000 mg | MEDICATED_PATCH | Freq: Every day | TRANSDERMAL | 0 refills | Status: DC

## 2019-09-05 NOTE — Progress Notes (Signed)
Physical Therapy Treatment Patient Details Name: Patrick [name] MRN: [id number] DOB: [birth year] Today's Date: 09/05/2019    History of Present Illness 44 yo male admitted with MSSA bacteremia, weakness. Hx of ETOH abuse, Hep C, T12 comp fx, rib fx    PT Comments    Pt demonstrating excellent progress.  He was able to ambulate safely with RW.  Demonstrates mobility necessary to d/c home from PT perspective, but will continue to benefit from PT while hospitalized to progress mobility to LRAD.     Follow Up Recommendations  Supervision/Assistance - 24 hour     Equipment Recommendations  None recommended by PT    Recommendations for Other Services       Precautions / Restrictions Precautions Precautions: Fall    Mobility  Bed Mobility Overal bed mobility: Needs Assistance Bed Mobility: Supine to Sit;Sit to Supine     Supine to sit: HOB elevated;Supervision Sit to supine: HOB elevated;Supervision      Transfers Overall transfer level: Needs assistance Equipment used: None Transfers: Sit to/from Stand Sit to Stand: Supervision         General transfer comment: Sit to stand x 3 without cues  Ambulation/Gait Ambulation/Gait assistance: Min guard;Supervision Gait Distance (Feet): 300 Feet Assistive device: Rolling walker (2 wheeled) Gait Pattern/deviations: Step-through pattern Gait velocity: mild decrease   General Gait Details: min guard progressed to supervision; steady no LOB; educated on setting correct walker height   Stairs             Wheelchair Mobility    Modified Rankin (Stroke Patients Only)       Balance Overall balance assessment: Needs assistance   Sitting balance-Leahy Scale: Normal     Standing balance support: No upper extremity supported Standing balance-Leahy Scale: Good                              Cognition Arousal/Alertness: Awake/alert Behavior During Therapy: WFL for tasks assessed/performed Overall  Cognitive Status: Within Functional Limits for tasks assessed                                        Exercises      General Comments General comments (skin integrity, edema, etc.): VSS on RA      Pertinent Vitals/Pain Pain Assessment: No/denies pain    Home Living                      Prior Function            PT Goals (current goals can now be found in the care plan section) Progress towards PT goals: Progressing toward goals    Frequency    Min 3X/week      PT Plan Current plan remains appropriate    Co-evaluation              AM-PAC PT "6 Clicks" Mobility   Outcome Measure  Help needed turning from your back to your side while in a flat bed without using bedrails?: None Help needed moving from lying on your back to sitting on the side of a flat bed without using bedrails?: None Help needed moving to and from a bed to a chair (including a wheelchair)?: None Help needed standing up from a chair using your arms (e.g., wheelchair or bedside chair)?: None Help needed to walk in  hospital room?: None Help needed climbing 3-5 steps with a railing? : A Little 6 Click Score: 23    End of Session Equipment Utilized During Treatment: Gait belt Activity Tolerance: Patient tolerated treatment well Patient left: in bed;with call bell/phone within reach;with bed alarm set   PT Visit Diagnosis: History of falling (Z91.81);Pain;Muscle weakness (generalized) (M62.81)     Time: 1210-1225 PT Time Calculation (min) (ACUTE ONLY): 15 min  Charges:  $Gait Training: 8-22 mins                     Maggie Font, PT Acute Rehab Services Pager [phone] [facility] [phone] [facility] [phone]    Karlton Lemon 09/05/2019, 1:11 PM

## 2019-09-05 NOTE — Discharge Summary (Signed)
Physician Discharge Summary  Patrick [name] JME:[id number] DOB: 08-Jun-1975 DOA: 08/28/2019  PCP: Patient, No Pcp Per  Admit date: 08/28/2019 Discharge date: 09/05/2019  Time spent: 22 minutes  Recommendations for Outpatient Follow-up:  1. High risk for readmission if does not comply with recommendations to cease drinking-I do not think he will comply with medical recommendations 2. Multiple new medications this admission have been called into his [place] including acid reducer and Carafate 3. We will try and arrange a TOC PCP follow-up if he is willing 4. Recommend Chem-12 INR and CBC in about 1 week 5. Recommend repeat PTH in the outpatient setting if there are persistent concerns about hypocalcemia although I think this was related in someway to his chronic ethanolism 6. Patient will need outpatient follow-up with gastroenterology/infectious disease because of need for follow-up for hepatitis C given elevated titers I briefly spoke with Dr. Graylon Good of infectious disease but will copy Dr. Graylon Good and Dr. Johnnye Sima on this note for care coordination  Discharge Diagnoses:  Principal Problem:   Severe sepsis with acute organ dysfunction due to methicillin susceptible Staphylococcus aureus (MSSA) ([place]) Active Problems:   Alcohol withdrawal ([place])   Bacteremia due to Staphylococcus   Hypokalemia   Sepsis with acute liver failure without hepatic coma or septic shock ([place])   Transaminitis   Discharge Condition: Improved but overall guarded if he continues to drink which I suspect he might  Diet recommendation: Regular  Filed Weights   09/03/19 0500 09/04/19 0443 09/05/19 0500  Weight: 88.2 kg 86.7 kg 88.5 kg    History of present illness:  44-year-old white male known ethanol habituation prior L2 compression fracture 12th rib fracture 08/27/2017, HTN presented to the hospital with 1 week history of generalized increasing weakness and abdominal pain-EMS apparently found him lying outside his  home covered in feces, disheveled and with crusted blood around his mouth Lactic acid was 1.1 on admission T-max 101.2?  Concern for pneumonia on admission therefore started sepsis protocol coverage There was a transaminitis on admission 239/106--critical care initially consulted and ultimately admitted by hospitalist on 4/29 Found to have severe thrombocytopenia and oncology consulted Eventually found to have MSSA bacteremia    Hospital Course:  MSSA bacteremia TEE no vegetation 5/6 TTE EF 55 to 60% 2-week duration--- unfortunately social barriers prevented him from being able to obtain Ancef reliably therefore I discussed with Dr. Graylon Good about his case with MSSA and she recommends oritavancin 3-hour infusion and we can remove the PICC line subsequently prior to patient being discharged Right hip plain films performed on 5/5 shows no overt abscess or fluid collection although there may be a fracture Therapy recommends as an outpatient management with home health PT which we will order   ?  Melena Was on IV PPI since taking p.o. will transition to p.o. Endoscopy 5/6 (conjunction TEE) =nl esophagus, erythematous mucosa cardia gastrum fundus-erythematous duodenopathy-recommendation = avoid EtOH, PPI X1 month twice daily I added Carafate as well on discharge   Resolving hypokalemia, hypomagnesemia Both resolved on discharge  Elevated PTH Etiology unclear-we will need labs including a calcium in the next week week and a half and if this is suppressed may need to consider work-up although I think this is secondary to his chronic ethanolism may need to consider work-up of hypercalcemia if there are persistent concerns   Severe thrombocytopenia-Nad[place] 08/30/2019 was 20--counts have since improved to 147 and this was likely secondary to toxic effect of alcohol-oncology signed off 5/5   Hepatitis C  reactive HCV log 6.167 Will inbox message to Dr. Ninetta Lights for outpatient coordination of hep C Rx ID has  decided to immunize with hepatitis a and B vaccines Will need follow-up at [facility] in about 3 weeks post completion Ancef   Alcoholic transaminitis alcoholic liver disease CIWA  Was low and we discontinued protocol however was a little agitated overnight 5/5  Patient resolved over the course of time while here    Hypertension Continue lisinopril 5 daily   Anemia of chronic disease with severe thrombocytopenia FOBT was positive-GI input as above no reports of further bleeding  Procedures: EGD on 5/6 showed normal esophagus erythematous mucosa in the cardia gastrium gastric body antrum prepylorus which was biopsied and erythematous to adenopathy   TEE performed TTE performed and no vegetations EF was 60-65% without wall motion abnormalities  Consultations:  Gastroenterology  Infectious disease  Discharge Exam: Vitals:   09/04/19 2052 09/05/19 0545  BP: (!) 131/95 (!) 130/101  Pulse: 100 96  Resp: 18 16  Temp: 98.4 F (36.9 C) 98.7 F (37.1 C)  SpO2: 100% 99%   Tells me he is feeling fair Nursing reports no further agitation although he did not sleep most of the night No chest pain Continues to complain of some hip pain and abdominal pain but by objective observation I do not feel this is as severe as 10/10 He is aware that we would not be prescribing tramadol on discharge given our concerns that me he may resume drinking in the outpatient setting which would be dangerous if he were to take tramadol in addition   General: Somewhat disheveled less sleepy more awake Cardiovascular: S1-S2 no murmur rub or gallop Respiratory: Clinically clear no rales no rhonchi Abdomen soft no rebound no guarding and exam out of proportion to actual objective findings ROM intact some rash to lower extremities Hip wound not examined today  Discharge Instructions   Discharge Instructions    Diet - low sodium heart healthy   Complete by: As directed    Discharge instructions   Complete  by: As directed    Please follow-up with your primary physician in the next week or so and ensure that you have follow-up I would recommend that you quit drinking completely We have prescribed for you acid reducers and something to coat your belly because you had dark stool earlier in the admission I would recommend strongly that you take these to prevent further bleeding I would also recommend that you seek help if you are so inclined to quit drinking   Increase activity slowly   Complete by: As directed      Allergies as of 09/05/2019      Reactions   Mushroom Extract Complex Diarrhea, Other (See Comments)   Itchy throat      Medication List    STOP taking these medications   docusate sodium 100 MG capsule Commonly known as: COLACE   ibuprofen 200 MG tablet Commonly known as: ADVIL   Oxycodone HCl 10 MG Tabs     TAKE these medications   acetaminophen 325 MG tablet Commonly known as: TYLENOL Take 3 tablets (975 mg total) by mouth every 6 (six) hours as needed for mild pain.   lisinopril 5 MG tablet Commonly known as: ZESTRIL Take 1 tablet (5 mg total) by mouth daily. What changed: Another medication with the same name was removed. Continue taking this medication, and follow the directions you see here.   methocarbamol 500 MG tablet Commonly known as:  Robaxin Take 1 tablet (500 mg total) by mouth every 8 (eight) hours as needed for muscle spasms.   MULTIVITAMIN PO Take 1 tablet by mouth daily as needed (nutrition).   nicotine 21 mg/24hr patch Commonly known as: NICODERM CQ - dosed in mg/24 hours Place 1 patch (21 mg total) onto the skin daily.   pantoprazole 40 MG tablet Commonly known as: PROTONIX Take 1 tablet (40 mg total) by mouth 2 (two) times daily.   sucralfate 1 g tablet Commonly known as: CARAFATE Take 1 tablet (1 g total) by mouth 4 (four) times daily -  with meals and at bedtime.      Allergies  Allergen Reactions  . Mushroom Extract Complex  Diarrhea and Other (See Comments)    Itchy throat      The results of significant diagnostics from this hospitalization (including imaging, microbiology, ancillary and laboratory) are listed below for reference.    Significant Diagnostic Studies: CT HEAD WO CONTRAST  Result Date: 08/29/2019 CLINICAL DATA:  Fall with headache.  Thrombocytopenia EXAM: CT HEAD WITHOUT CONTRAST TECHNIQUE: Contiguous axial images were obtained from the base of the skull through the vertex without intravenous contrast. COMPARISON:  04/10/2018 FINDINGS: Brain: No evidence of swelling, infarction, hemorrhage, hydrocephalus, extra-axial collection or mass lesion/mass effect. Dilated perivascular spaces at/below the basal ganglia. Vascular: No hyperdense vessel or unexpected calcification. Skull: Normal. Negative for fracture or focal lesion. Sinuses/Orbits: No evidence of injury Other: Motion degradation requiring 2 acquisitions. IMPRESSION: No evidence of intracranial injury. Electronically Signed   By: Marnee Spring M.D.   On: 08/29/2019 06:05   CT ABDOMEN PELVIS W CONTRAST  Result Date: 08/28/2019 CLINICAL DATA:  Abdominal pain, hematemesis EXAM: CT ABDOMEN AND PELVIS WITH CONTRAST TECHNIQUE: Multidetector CT imaging of the abdomen and pelvis was performed using the standard protocol following bolus administration of intravenous contrast. CONTRAST:  OMNIPAQUE IOHEXOL 300 MG/ML  SOLN COMPARISON:  08/26/2017 FINDINGS: Lower chest: No acute abnormality. Hepatobiliary: Marked diffuse hepatic steatosis. No focal hepatic lesion is identified. Gallbladder appears unremarkable. No hyperdense gallstone. No biliary dilatation. Pancreas: Unremarkable. No pancreatic ductal dilatation or surrounding inflammatory changes. Spleen: Normal in size without focal abnormality. Adrenals/Urinary Tract: 6 mm upper pole right renal calculus. Punctate 2 mm calculus within the midpole of the left kidney. Kidneys enhance symmetrically. No  renal lesion or hydronephrosis. Bilateral ureters are unremarkable. Urinary bladder is unremarkable. No adrenal nodule. Stomach/Bowel: Stomach is within normal limits. Appendix appears normal. No evidence of bowel wall thickening, distention, or inflammatory changes. Vascular/Lymphatic: Scattered atherosclerotic calcifications of the abdominal aorta. No aneurysm. No abdominopelvic lymphadenopathy. Reproductive: Prostate is unremarkable. Other: No abdominal wall hernia or abnormality. No abdominopelvic ascites. Musculoskeletal: Chronic moderate superior endplate compression deformity of L2 with mild bony retropulsion, unchanged from prior. No new or acute osseous findings. IMPRESSION: 1. No acute abdominopelvic findings. 2. Marked diffuse hepatic steatosis. 3. Bilateral nonobstructive nephrolithiasis. 4. Chronic superior endplate compression deformity of L2 with mild bony retropulsion. 5. Aortic atherosclerosis. (ICD10-I70.0). Electronically Signed   By: Duanne Guess D.O.   On: 08/28/2019 16:54   US Abdomen Limited  Result Date: 08/28/2019 CLINICAL DATA:  Right upper quadrant pain; septic; significant ETOH use. EXAM: ULTRASOUND ABDOMEN LIMITED RIGHT UPPER QUADRANT COMPARISON:  CT abdomen/pelvis 08/28/2019, abdominal ultrasound 04/17/2006. FINDINGS: Gallbladder: There is sludge within the gallbladder. No gallstones are visualized. No appreciable gallbladder wall thickening. No sonographic Eulah Pont sign is elicited by the scanning technologist. Common bile duct: Diameter: The common duct is nonvisualized due to obscuration  by overlying bowel gas. Liver: No focal lesion identified. Generalized increased hepatic parenchymal echogenicity. Portal vein is patent on color Doppler imaging with normal direction of blood flow towards the liver. IMPRESSION: Gallbladder sludge. No appreciable gallstones or gallbladder wall thickening. The common duct is non-visualized due to obscuration by overlying bowel gas.  Hyperechogenicity of the hepatic parenchyma consistent with marked hepatic steatosis demonstrated on same day CT abdomen/pelvis. Electronically Signed   By: Jackey Loge DO   On: 08/28/2019 19:30   DG CHEST PORT 1 VIEW  Result Date: 09/02/2019 CLINICAL DATA:  Shortness of breath EXAM: PORTABLE CHEST 1 VIEW COMPARISON:  Radiograph 08/31/2019 FINDINGS: Chronic mild nonspecific elevation the right hemidiaphragm. Faint residual peripheral airspace opacities significantly decreased from comparison. No pneumothorax or effusion. The cardiomediastinal contours are unremarkable. No acute osseous or soft tissue abnormality. Telemetry leads overlie the chest. Nasal cannula projects over the upper chest. Remote posttraumatic deformity left clavicle. IMPRESSION: 1. Faint residual peripheral airspace opacities, significantly decreased from comparison. Electronically Signed   By: Kreg Shropshire M.D.   On: 09/02/2019 06:33   DG CHEST PORT 1 VIEW  Result Date: 08/31/2019 CLINICAL DATA:  Melanotic stools.  Shortness of breath. EXAM: PORTABLE CHEST 1 VIEW COMPARISON:  08/28/2019 FINDINGS: There are new bilateral hazy airspace opacities, most evident in the left mid lung zone. There is no pneumothorax. There may be small bilateral pleural effusions. The heart size is essentially stable from prior study. There is no evidence for an acute displaced fracture. There is an old fracture of the left clavicle. IMPRESSION: Interval development of bilateral airspace opacities, greatest on the left. Findings are concerning for multifocal pneumonia (viral or bacterial). Electronically Signed   By: Katherine Mantle M.D.   On: 08/31/2019 18:23   DG Chest Port 1 View  Result Date: 08/28/2019 CLINICAL DATA:  Sepsis weakness EXAM: PORTABLE CHEST 1 VIEW COMPARISON:  09/23/2018, CT 08/26/2017, radiograph 05/26/2016 FINDINGS: Low lung volumes. Mildly elevated right diaphragm. No consolidation or effusion. Heart size within normal limits allowing  for low lung volume. No pneumothorax.nodular opacity in the left mid lung probably represents vessel on end. IMPRESSION: Low lung volumes. Nodular opacity in the left mid lung probably represents vascular artifact. Suggest short interval two-view chest radiograph follow-up. Electronically Signed   By: Jasmine Pang M.D.   On: 08/28/2019 15:01   EEG adult  Result Date: 08/29/2019 Charlsie Quest, MD     08/29/2019  4:16 PM Patient Name: Patrick [name] MRN: [id number] Epilepsy Attending: Charlsie Quest Referring Physician/Provider: Dr. Ramiro Harvest Date: 08/29/2019 Duration: 24.14 min Patient history: 44 year old male with altered mental status.  EEG evaluate for seizures. Level of alertness: Awake,asleep AEDs during EEG study: None Technical aspects: This EEG study was done with scalp electrodes positioned according to the 10-20 International system of electrode placement. Electrical activity was acquired at a sampling rate of  and reviewed with a high frequency filter of  and a low frequency filter of . EEG data were recorded continuously and digitally stored. Description: The posterior dominant rhythm consists of 8-9 Hz activity of moderate voltage (25-35 uV) seen predominantly in posterior head regions, symmetric and reactive to eye opening and eye closing. Sleep was characterized by sleep spindles (12-14hz ), maximal frontocentral region. Hyperventilation and photic stimulation were not performed.  Of note, eeg was technically difficult due to significant myogenic and electrode artifact. IMPRESSION: This technically difficult study is within normal limits. No seizures or epileptiform discharges were seen throughout the recording. Priyanka O  Yadav   ECHOCARDIOGRAM COMPLETE  Result Date: 08/29/2019    ECHOCARDIOGRAM REPORT   Patient Name:   Patrick [name] Date of Exam: 08/29/2019 Medical Rec #:  [id number]    Height:       71.0 in Accession #:    [id number]   Weight:       180.0 lb Date of Birth:   11-02-1975     BSA:          2.016 m Patient Age:    43 years     BP:           142/102 mmHg Patient Gender: M            HR:           104 bpm. Exam Location:  Inpatient Procedure: 2D Echo, Cardiac Doppler and Color Doppler Indications:    CHF  History:        Patient has no prior history of Echocardiogram examinations.                 Risk Factors:Hypertension and Current Smoker. Alcohol abuse.  Sonographer:    Lavenia Atlas Referring Phys: [phone] DANIEL V THOMPSON  Sonographer Comments: Image acquisition challenging due to uncooperative patient. IMPRESSIONS  1. Left ventricular ejection fraction, by estimation, is 55 to 60%. The left ventricle has normal function. The left ventricle has no regional wall motion abnormalities. Left ventricular diastolic parameters are consistent with Grade I diastolic dysfunction (impaired relaxation).  2. Right ventricular systolic function is normal. The right ventricular size is normal.  3. Left atrial size was mildly dilated.  4. The mitral valve is normal in structure. No evidence of mitral valve regurgitation. No evidence of mitral stenosis.  5. The aortic valve is tricuspid. Aortic valve regurgitation is not visualized. Mild aortic valve sclerosis is present, with no evidence of aortic valve stenosis.  6. The inferior vena cava is normal in size with greater than 50% respiratory variability, suggesting right atrial pressure of 3 mmHg. FINDINGS  Left Ventricle: Left ventricular ejection fraction, by estimation, is 55 to 60%. The left ventricle has normal function. The left ventricle has no regional wall motion abnormalities. The left ventricular internal cavity size was normal in size. There is  no left ventricular hypertrophy. Left ventricular diastolic parameters are consistent with Grade I diastolic dysfunction (impaired relaxation). Right Ventricle: The right ventricular size is normal. No increase in right ventricular wall thickness. Right ventricular systolic function  is normal. Left Atrium: Left atrial size was mildly dilated. Right Atrium: Right atrial size was normal in size. Pericardium: There is no evidence of pericardial effusion. Mitral Valve: The mitral valve is normal in structure. Normal mobility of the mitral valve leaflets. No evidence of mitral valve regurgitation. No evidence of mitral valve stenosis. Tricuspid Valve: The tricuspid valve is normal in structure. Tricuspid valve regurgitation is not demonstrated. No evidence of tricuspid stenosis. Aortic Valve: The aortic valve is tricuspid. Aortic valve regurgitation is not visualized. Mild aortic valve sclerosis is present, with no evidence of aortic valve stenosis. Pulmonic Valve: The pulmonic valve was normal in structure. Pulmonic valve regurgitation is not visualized. No evidence of pulmonic stenosis. Aorta: The aortic root is normal in size and structure. Venous: The inferior vena cava is normal in size with greater than 50% respiratory variability, suggesting right atrial pressure of 3 mmHg. IAS/Shunts: The interatrial septum was not well visualized.  LEFT VENTRICLE PLAX 2D LVIDd:         4.90  cm  Diastology LVIDs:         3.40 cm  LV e' lateral:   10.00 cm/s LV PW:         1.20 cm  LV E/e' lateral: 7.0 LV IVS:        1.10 cm  LV e' medial:    6.85 cm/s LVOT diam:     2.30 cm  LV E/e' medial:  10.3 LV SV:         48 LV SV Index:   24 LVOT Area:     4.15 cm  RIGHT VENTRICLE RV Basal diam:  2.40 cm RV S prime:     6.31 cm/s TAPSE (M-mode): 3.3 cm LEFT ATRIUM             Index       RIGHT ATRIUM           Index LA diam:        3.80 cm 1.88 cm/m  RA Area:     12.00 cm LA Vol (A2C):   31.7 ml 15.72 ml/m RA Volume:   22.40 ml  11.11 ml/m LA Vol (A4C):   52.2 ml 25.89 ml/m LA Biplane Vol: 43.8 ml 21.72 ml/m  AORTIC VALVE LVOT Vmax:   71.80 cm/s LVOT Vmean:  42.900 cm/s LVOT VTI:    0.115 m  AORTA Ao Root diam: 2.80 cm MITRAL VALVE MV Area (PHT): 10.25 cm   SHUNTS MV Decel Time: 74 msec     Systemic VTI:  0.12  m MV E velocity: 70.30 cm/s  Systemic Diam: 2.30 cm MV A velocity: 88.70 cm/s MV E/A ratio:  0.79 Charlton Haws MD Electronically signed by Charlton Haws MD Signature Date/Time: 08/29/2019/1:12:18 PM    Final    ECHO TEE  Result Date: 09/04/2019    TRANSESOPHOGEAL ECHO REPORT   Patient Name:   Patrick [name] Date of Exam: 09/04/2019 Medical Rec #:  [id number]    Height:       70.0 in Accession #:    [id number]   Weight:       191.1 lb Date of Birth:  October 07, 1975     BSA:          2.048 m Patient Age:    44 years     BP:           143/97 mmHg Patient Gender: M            HR:           92 bpm. Exam Location:  Inpatient Procedure: Transesophageal Echo and Color Doppler Indications:     Bacteremia R78.81  History:         Patient has prior history of Echocardiogram examinations, most                  recent 08/29/2019. Risk Factors:Hypertension.  Sonographer:     Thurman Coyer RDCS (AE) Referring Phys:  [id number] Tacey Ruiz DUNN Diagnosing Phys: [name] MD PROCEDURE: After discussion of the risks and benefits of a TEE, an informed consent was obtained from the patient. The transesophogeal probe was passed without difficulty through the esophogus of the patient. Sedation performed by different physician. The patient was monitored while under deep sedation. Anesthestetic sedation was provided intravenously by Anesthesiology: 405.47mg  of Propofol. The patient's vital signs; including heart rate, blood pressure, and oxygen saturation; remained stable throughout the procedure. The patient developed no complications during the procedure. IMPRESSIONS  1. Left ventricular ejection fraction, by estimation,  is 60 to 65%. The left ventricle has normal function. The left ventricle has no regional wall motion abnormalities.  2. Right ventricular systolic function is normal. The right ventricular size is normal.  3. No left atrial/left atrial appendage thrombus was detected.  4. The mitral valve is normal in structure. Mild mitral valve  regurgitation. No evidence of mitral stenosis.  5. The aortic valve is normal in structure. Aortic valve regurgitation is not visualized. No aortic stenosis is present.  6. The inferior vena cava is normal in size with greater than 50% respiratory variability, suggesting right atrial pressure of 3 mmHg. Conclusion(s)/Recommendation(s): Normal biventricular function without evidence of hemodynamically significant valvular heart disease. No evidence of vegetation/infective endocarditis on this transesophageal echocardiogram. FINDINGS  Left Ventricle: Left ventricular ejection fraction, by estimation, is 60 to 65%. The left ventricle has normal function. The left ventricle has no regional wall motion abnormalities. The left ventricular internal cavity size was normal in size. There is  no left ventricular hypertrophy. Right Ventricle: The right ventricular size is normal. No increase in right ventricular wall thickness. Right ventricular systolic function is normal. Left Atrium: Left atrial size was normal in size. No left atrial/left atrial appendage thrombus was detected. Right Atrium: Right atrial size was normal in size. Pericardium: There is no evidence of pericardial effusion. Mitral Valve: The mitral valve is normal in structure. Normal mobility of the mitral valve leaflets. Mild mitral valve regurgitation. No evidence of mitral valve stenosis. Tricuspid Valve: The tricuspid valve is normal in structure. Tricuspid valve regurgitation is not demonstrated. No evidence of tricuspid stenosis. Aortic Valve: The aortic valve is normal in structure. Aortic valve regurgitation is not visualized. No aortic stenosis is present. Pulmonic Valve: The pulmonic valve was normal in structure. Pulmonic valve regurgitation is not visualized. No evidence of pulmonic stenosis. Aorta: The aortic root is normal in size and structure. Venous: The inferior vena cava is normal in size with greater than 50% respiratory variability,  suggesting right atrial pressure of 3 mmHg. IAS/Shunts: No atrial level shunt detected by color flow Doppler. [name] MD Electronically signed by [name] MD Signature Date/Time: 09/04/2019/2:14:12 PM    Final    DG HIP UNILAT WITH PELVIS 1V RIGHT  Result Date: 09/02/2019 CLINICAL DATA:  Hip pain. EXAM: DG HIP (WITH OR WITHOUT PELVIS) 1V RIGHT COMPARISON:  CT 08/28/2019. FINDINGS: No evidence of femoral fracture or dislocation. Miniscule bony density noted adjacent to the right acetabulum, this may represent a tiny fracture fragment, age undetermined. Degenerative change could also present this fashion. IMPRESSION: No evidence of femoral fracture or dislocation. 2. Miniscule bony density noted adjacent to the right acetabulum, this may represent a tiny fracture fragment, age undetermined. Degenerative change could also present this fashion. Electronically Signed   By: Maisie Fus  Register   On: 09/02/2019 11:03   [place] EKG SITE RITE  Result Date: 08/29/2019 If Site Rite image not attached, placement could not be confirmed due to current cardiac rhythm.  [place] EKG SITE RITE  Result Date: 08/29/2019 If Site Rite image not attached, placement could not be confirmed due to current cardiac rhythm.   Microbiology: Recent Results (from the past 240 hour(s))  Blood Culture (routine x 2)     Status: Abnormal   Collection Time: 08/28/19  2:12 PM   Specimen: BLOOD  Result Value Ref Range Status   Specimen Description   Final    BLOOD LEFT ANTECUBITAL Performed at [facility], [address] [place],  [place] [address]    Special Requests   Final    BOTTLES DRAWN AEROBIC AND ANAEROBIC Blood Culture adequate volume Performed at [facility], [address] [place], [place] [address]    Culture  Setup Time   Final    GRAM POSITIVE COCCI IN BOTH AEROBIC AND ANAEROBIC BOTTLES CRITICAL RESULT CALLED TO, READ BACK BY AND VERIFIED WITH: E. JACKSON, PHARMD (WL) AT 5621 ON 08/29/19 BY  C. JESSUP, MT. Performed at [facility], [address] [place], [place] [address]    Culture STAPHYLOCOCCUS AUREUS (A)  Final   Report Status 08/31/2019 FINAL  Final   Organism ID, Bacteria STAPHYLOCOCCUS AUREUS  Final      Susceptibility   Staphylococcus aureus - MIC*    CIPROFLOXACIN 2 INTERMEDIATE Intermediate     ERYTHROMYCIN <=0.25 SENSITIVE Sensitive     GENTAMICIN <=0.5 SENSITIVE Sensitive     OXACILLIN 0.5 SENSITIVE Sensitive     TETRACYCLINE <=1 SENSITIVE Sensitive     VANCOMYCIN <=0.5 SENSITIVE Sensitive     TRIMETH/SULFA <=10 SENSITIVE Sensitive     CLINDAMYCIN <=0.25 SENSITIVE Sensitive     RIFAMPIN <=0.5 SENSITIVE Sensitive     Inducible Clindamycin NEGATIVE Sensitive     * STAPHYLOCOCCUS AUREUS  Blood Culture ID Panel (Reflexed)     Status: Abnormal   Collection Time: 08/28/19  2:12 PM  Result Value Ref Range Status   Enterococcus species NOT DETECTED NOT DETECTED Final   Listeria monocytogenes NOT DETECTED NOT DETECTED Final   Staphylococcus species DETECTED (A) NOT DETECTED Final    Comment: CRITICAL RESULT CALLED TO, READ BACK BY AND VERIFIED WITH: E. JACKSON, PHARMD (WL) AT 7846 ON 08/29/19 BY C. JESSUP, MT.    Staphylococcus aureus (BCID) DETECTED (A) NOT DETECTED Final    Comment: Methicillin (oxacillin) susceptible Staphylococcus aureus (MSSA). Preferred therapy is anti staphylococcal beta lactam antibiotic (Cefazolin or Nafcillin), unless clinically contraindicated. CRITICAL RESULT CALLED TO, READ BACK BY AND VERIFIED WITH: E. JACKSON, PHARMD (WL) AT 9629 ON 08/29/19 BY C. JESSUP, MT.    Methicillin resistance NOT DETECTED NOT DETECTED Final   Streptococcus species NOT DETECTED NOT DETECTED Final   Streptococcus agalactiae NOT DETECTED NOT DETECTED Final   Streptococcus pneumoniae NOT DETECTED NOT DETECTED Final   Streptococcus pyogenes NOT DETECTED NOT DETECTED Final   Acinetobacter baumannii NOT DETECTED NOT DETECTED Final   Enterobacteriaceae  species NOT DETECTED NOT DETECTED Final   Enterobacter cloacae complex NOT DETECTED NOT DETECTED Final   Escherichia coli NOT DETECTED NOT DETECTED Final   Klebsiella oxytoca NOT DETECTED NOT DETECTED Final   Klebsiella pneumoniae NOT DETECTED NOT DETECTED Final   Proteus species NOT DETECTED NOT DETECTED Final   Serratia marcescens NOT DETECTED NOT DETECTED Final   Haemophilus influenzae NOT DETECTED NOT DETECTED Final   Neisseria meningitidis NOT DETECTED NOT DETECTED Final   Pseudomonas aeruginosa NOT DETECTED NOT DETECTED Final   Candida albicans NOT DETECTED NOT DETECTED Final   Candida glabrata NOT DETECTED NOT DETECTED Final   Candida krusei NOT DETECTED NOT DETECTED Final   Candida parapsilosis NOT DETECTED NOT DETECTED Final   Candida tropicalis NOT DETECTED NOT DETECTED Final    Comment: Performed at [facility], [address], [place], [place] [address]  Blood Culture (routine x 2)     Status: Abnormal   Collection Time: 08/28/19  2:27 PM   Specimen: BLOOD  Result Value Ref Range Status   Specimen Description   Final  BLOOD RIGHT ANTECUBITAL Performed at [facility], [address] [place], [place] [address]    Special Requests   Final    BOTTLES DRAWN AEROBIC AND ANAEROBIC Blood Culture adequate volume Performed at [facility], [address], [place], [place] [address]    Culture  Setup Time   Final    GRAM POSITIVE COCCI IN CLUSTERS ANAEROBIC BOTTLE ONLY CRITICAL VALUE NOTED.  VALUE IS CONSISTENT WITH PREVIOUSLY REPORTED AND CALLED VALUE.    Culture (A)  Final    STAPHYLOCOCCUS AUREUS SUSCEPTIBILITIES PERFORMED ON PREVIOUS CULTURE WITHIN THE LAST 5 DAYS. Performed at [facility], [address], [place], [place] [address]    Report Status 08/31/2019 FINAL  Final  Respiratory Panel by RT PCR (Flu A&B, Covid) - Nasopharyngeal Swab     Status: None   Collection Time: 08/28/19  3:30 PM   Specimen:  Nasopharyngeal Swab  Result Value Ref Range Status   SARS Coronavirus 2 by RT PCR NEGATIVE NEGATIVE Final    Comment: (NOTE) SARS-CoV-2 target nucleic acids are NOT DETECTED. The SARS-CoV-2 RNA is generally detectable in upper respiratoy specimens during the acute phase of infection. The lowest concentration of SARS-CoV-2 viral copies this assay can detect is 131 copies/mL. A negative result does not preclude SARS-Cov-2 infection and should not be used as the sole basis for treatment or other patient management decisions. A negative result may occur with  improper specimen collection/handling, submission of specimen other than nasopharyngeal swab, presence of viral mutation(s) within the areas targeted by this assay, and inadequate number of viral copies (<131 copies/mL). A negative result must be combined with clinical observations, patient history, and epidemiological information. The expected result is Negative. Fact Sheet for Patients:  https://www.moore.com/ Fact Sheet for Healthcare Providers:  https://www.young.biz/ This test is not yet ap proved or cleared by the [place] FDA and  has been authorized for detection and/or diagnosis of SARS-CoV-2 by FDA under an Emergency Use Authorization (EUA). This EUA will remain  in effect (meaning this test can be used) for the duration of the COVID-19 declaration under Section 564(b)(1) of the Act, 21 U.S.C. section 360bbb-3(b)(1), unless the authorization is terminated or revoked sooner.    Influenza A by PCR NEGATIVE NEGATIVE Final   Influenza B by PCR NEGATIVE NEGATIVE Final    Comment: (NOTE) The Xpert Xpress SARS-CoV-2/FLU/RSV assay is intended as an aid in  the diagnosis of influenza from Nasopharyngeal swab specimens and  should not be used as a sole basis for treatment. Nasal washings and  aspirates are unacceptable for Xpert Xpress SARS-CoV-2/FLU/RSV  testing. Fact Sheet for  Patients: https://www.moore.com/ Fact Sheet for Healthcare Providers: https://www.young.biz/ This test is not yet approved or cleared by the [place] FDA and  has been authorized for detection and/or diagnosis of SARS-CoV-2 by  FDA under an Emergency Use Authorization (EUA). This EUA will remain  in effect (meaning this test can be used) for the duration of the  Covid-19 declaration under Section 564(b)(1) of the Act, 21  U.S.C. section 360bbb-3(b)(1), unless the authorization is  terminated or revoked. Performed at [facility], [address], [place], [place] [address]   Urine culture     Status: None   Collection Time: 08/28/19  4:26 PM   Specimen: In/Out Cath Urine  Result Value Ref Range Status   Specimen Description   Final    IN/OUT CATH URINE Performed at [facility], [address]  [place], [place] [address]    Special Requests   Final    NONE Performed at [facility], [address] [place], [place] [address]    Culture   Final    NO GROWTH Performed at [facility], [address], [place], [place] [address]    Report Status 08/29/2019 FINAL  Final  Culture, blood (Routine X 2) w Reflex to ID Panel     Status: None   Collection Time: 08/30/19  1:16 PM   Specimen: Right Antecubital; Blood  Result Value Ref Range Status   Specimen Description   Final    RIGHT ANTECUBITAL Performed at [facility], [address] [place], [place] [address]    Special Requests   Final    BOTTLES DRAWN AEROBIC AND ANAEROBIC Blood Culture adequate volume Performed at [facility], [address], [place], [place] [address]    Culture   Final    NO GROWTH 5 DAYS Performed at [facility], [address] [place], [place] [address]    Report Status 09/04/2019 FINAL  Final  Culture, blood (Routine X 2) w Reflex to ID Panel      Status: None   Collection Time: 08/30/19  1:16 PM   Specimen: BLOOD RIGHT HAND  Result Value Ref Range Status   Specimen Description   Final    BLOOD RIGHT HAND Performed at [facility], [address], [place], [place] [address]    Special Requests   Final    BOTTLES DRAWN AEROBIC ONLY Blood Culture adequate volume Performed at [facility], [address] [place], [place] [address]    Culture   Final    NO GROWTH 5 DAYS Performed at [facility], [address] [place], [place] [address]    Report Status 09/04/2019 FINAL  Final  MRSA PCR Screening     Status: None   Collection Time: 08/31/19  8:17 PM   Specimen: Nasal Mucosa; Nasopharyngeal  Result Value Ref Range Status   MRSA by PCR NEGATIVE NEGATIVE Final    Comment:        The GeneXpert MRSA Assay (FDA approved for NASAL specimens only), is one component of a comprehensive MRSA colonization surveillance program. It is not intended to diagnose MRSA infection nor to guide or monitor treatment for MRSA infections. Performed at [facility], [address] [place], [place] [address]   Culture, blood (Routine X 2) w Reflex to ID Panel     Status: None (Preliminary result)   Collection Time: 09/01/19 10:01 AM   Specimen: BLOOD RIGHT HAND  Result Value Ref Range Status   Specimen Description   Final    BLOOD RIGHT HAND Performed at [facility], [address] [place], [place] [address]    Special Requests   Final    BOTTLES DRAWN AEROBIC AND ANAEROBIC Blood Culture results may not be optimal due to an inadequate volume of blood received in culture bottles Performed at [facility], [address], [place], [place] [address]    Culture   Final    NO GROWTH 4 DAYS Performed at [facility], [address] [place], [place] [address]    Report Status PENDING  Incomplete  Culture, blood (Routine X 2) w Reflex  to ID Panel     Status: None (Preliminary result)   Collection Time: 09/01/19 10:08 AM  Specimen: BLOOD LEFT HAND  Result Value Ref Range Status   Specimen Description   Final    BLOOD LEFT HAND Performed at [facility], [address] [place], [place] [address]    Special Requests   Final    BOTTLES DRAWN AEROBIC ONLY Blood Culture results may not be optimal due to an inadequate volume of blood received in culture bottles Performed at [facility], [address], [place], [place] [address]    Culture   Final    NO GROWTH 4 DAYS Performed at [facility], [address], [place], [place] [address]    Report Status PENDING  Incomplete     Labs: Basic Metabolic Panel: Recent Labs  Lab 08/30/19 [id number] 08/30/19 6578 08/31/19 0542 08/31/19 0542 08/31/19 2105 08/31/19 2105 09/01/19 0331 09/02/19 0216 09/03/19 0544 09/04/19 0303 09/05/19 0539  NA 133*   < > 137   < > 137   < > 134* 134* 135 134* 133*  K 2.8*   < > 3.1*   < > 3.7   < > 3.7 3.8 3.2* 3.5 3.5  CL 100   < > 108   < > 104   < > 104 104 103 102 102  CO2 25   < > 23   < > 23   < > GLUCOSE 104*   < > 103*   < > 110*   < > 91 94 98 89 110*  BUN 13   < > 9   < > 7   < > 5*  CREATININE 0.77   < > 0.69   < > 0.66   < > 0.67 0.72 0.75 0.71 0.68  CALCIUM 7.3*   < > 7.3*   < > 7.8*   < > 7.2* 7.6* 7.9* 7.8* 7.8*  MG 1.8   < > 1.6*   < > 1.7  --  1.6* 1.7 1.7 1.4*  --   PHOS 1.2*  --  1.7*  --   --   --  3.3  --  2.9 3.0  --    < > = values in this interval not displayed.   Liver Function Tests: Recent Labs  Lab 09/01/19 0331 09/02/19 0216 09/03/19 0544 09/04/19 0303 09/05/19 0539  AST 159* 155* 144* 108* 89*  ALT 48* 40 39 30 25  ALKPHOS 69 74 83 66 70  BILITOT 2.8* 2.6* 2.7* 2.0* 1.8*  PROT 5.0* 4.9* 5.9* 5.1* 5.2*  ALBUMIN 2.2* 2.2* 2.5* 2.2* 2.2*   Recent Labs  Lab 09/03/19 0544  LIPASE 91*   No results for input(s): AMMONIA in the  last 168 hours. CBC: Recent Labs  Lab 09/01/19 0331 09/02/19 0216 09/03/19 0544 09/04/19 0303 09/05/19 0539  WBC 5.3 6.4 5.4 6.3 6.6  NEUTROABS  --   --  2.9 4.0 4.8  HGB 7.6* 7.2* 8.4* 7.9* 7.2*  HCT 23.5* 22.9* 26.9* 25.1* 23.1*  MCV 108.8* 111.7* 111.6* 112.6* 111.6*  PLT 47* 84* 148* 147* 150   Cardiac Enzymes: Recent Labs  Lab 08/30/19 0712  CKTOTAL 692*   BNP: BNP (last 3 results) Recent Labs    08/31/19 1750  BNP 459.7*    ProBNP (last 3 results) No results for input(s): PROBNP in the last 8760 hours.  CBG: Recent Labs  Lab 09/01/19 0749 09/02/19 0819 09/03/19 0718 09/04/19 0805 09/05/19 0727  GLUCAP 106* 158* 124*  103* 111*       Signed:  Rhetta MuraJai-Gurmukh Maryama Kuriakose MD   [facility] 09/05/2019, 10:50 AM

## 2019-09-05 NOTE — Anesthesia Postprocedure Evaluation (Signed)
Anesthesia Post Note  Patient: Brazos Sandoval  Procedure(s) Performed: TRANSESOPHAGEAL ECHOCARDIOGRAM (TEE) (N/A ) ESOPHAGOGASTRODUODENOSCOPY (EGD) WITH PROPOFOL BIOPSY     Patient location during evaluation: PACU Anesthesia Type: MAC Level of consciousness: awake and alert Pain management: pain level controlled Vital Signs Assessment: post-procedure vital signs reviewed and stable Respiratory status: spontaneous breathing, nonlabored ventilation, respiratory function stable and patient connected to nasal cannula oxygen Cardiovascular status: stable and blood pressure returned to baseline Postop Assessment: no apparent nausea or vomiting Anesthetic complications: no    Last Vitals:  Vitals:   09/05/19 0545 09/05/19 1502  BP: (!) 130/101 110/86  Pulse: 96 (!) 105  Resp: 16 20  Temp: 37.1 C 36.7 C  SpO2: 99% 100%    Last Pain:  Vitals:   09/05/19 1502  TempSrc: Oral  PainSc:    Pain Goal: Patients Stated Pain Goal: 0 (09/02/19 1059)                 Aniayah Alaniz S

## 2019-09-05 NOTE — Hospital Course (Signed)
4

## 2019-09-05 NOTE — Progress Notes (Signed)
  Pt is being discharged home today. Discharge instructions including medications and follow up appointments given. Pt had no further questions at this time. 

## 2019-09-06 LAB — CULTURE, BLOOD (ROUTINE X 2)
Culture: NO GROWTH
Culture: NO GROWTH

## 2019-09-07 LAB — VITAMIN B1: Vitamin B1 (Thiamine): 127.5 nmol/L (ref 66.5–200.0)

## 2019-09-12 ENCOUNTER — Other Ambulatory Visit: Payer: Self-pay

## 2019-09-12 ENCOUNTER — Emergency Department (HOSPITAL_COMMUNITY)
Admission: EM | Admit: 2019-09-12 | Discharge: 2019-09-13 | Disposition: A | Discharge status: Home / Self Care | Payer: Self-pay | Attending: Emergency Medicine | Admitting: Emergency Medicine

## 2019-09-12 ENCOUNTER — Emergency Department (HOSPITAL_COMMUNITY): Payer: Self-pay

## 2019-09-12 ENCOUNTER — Encounter (HOSPITAL_COMMUNITY): Payer: Self-pay

## 2019-09-12 DIAGNOSIS — I10 Essential (primary) hypertension: Secondary | ICD-10-CM | POA: Insufficient documentation

## 2019-09-12 DIAGNOSIS — Z79899 Other long term (current) drug therapy: Secondary | ICD-10-CM | POA: Insufficient documentation

## 2019-09-12 DIAGNOSIS — R4182 Altered mental status, unspecified: Secondary | ICD-10-CM | POA: Insufficient documentation

## 2019-09-12 DIAGNOSIS — R600 Localized edema: Secondary | ICD-10-CM | POA: Insufficient documentation

## 2019-09-12 DIAGNOSIS — R531 Weakness: Secondary | ICD-10-CM | POA: Insufficient documentation

## 2019-09-12 DIAGNOSIS — F17210 Nicotine dependence, cigarettes, uncomplicated: Secondary | ICD-10-CM | POA: Insufficient documentation

## 2019-09-12 DIAGNOSIS — R0602 Shortness of breath: Secondary | ICD-10-CM | POA: Insufficient documentation

## 2019-09-12 LAB — COMPREHENSIVE METABOLIC PANEL
ALT: 36 U/L (ref 0–44)
AST: 75 U/L — ABNORMAL HIGH (ref 15–41)
Albumin: 2.6 g/dL — ABNORMAL LOW (ref 3.5–5.0)
Alkaline Phosphatase: 73 U/L (ref 38–126)
Anion gap: 4 — ABNORMAL LOW (ref 5–15)
BUN: 11 mg/dL (ref 6–20)
CO2: 21 mmol/L — ABNORMAL LOW (ref 22–32)
Calcium: 8.1 mg/dL — ABNORMAL LOW (ref 8.9–10.3)
Chloride: 112 mmol/L — ABNORMAL HIGH (ref 98–111)
Creatinine, Ser: 0.93 mg/dL (ref 0.61–1.24)
GFR calc Af Amer: >60 mL/min (ref >60)
GFR calc non Af Amer: >60 mL/min (ref >60)
Glucose, Bld: 94 mg/dL (ref 70–99)
Potassium: 3.8 mmol/L (ref 3.5–5.1)
Sodium: 137 mmol/L (ref 135–145)
Total Bilirubin: 1.3 mg/dL — ABNORMAL HIGH (ref 0.3–1.2)
Total Protein: 5.9 g/dL — ABNORMAL LOW (ref 6.5–8.1)

## 2019-09-12 LAB — CBC
HCT: 26.1 % — ABNORMAL LOW (ref 39.0–52.0)
Hemoglobin: 7.6 g/dL — ABNORMAL LOW (ref 13.0–17.0)
MCH: 33.9 pg (ref 26.0–34.0)
MCHC: 29.1 g/dL — ABNORMAL LOW (ref 30.0–36.0)
MCV: 116.5 fL — ABNORMAL HIGH (ref 80.0–100.0)
Platelets: 228 10*3/uL (ref 150–400)
RBC: 2.24 MIL/uL — ABNORMAL LOW (ref 4.22–5.81)
RDW: 15.5 % (ref 11.5–15.5)
WBC: 7.2 10*3/uL (ref 4.0–10.5)
nRBC: 0.3 % — ABNORMAL HIGH (ref 0.0–0.2)

## 2019-09-12 LAB — LIPASE, BLOOD: Lipase: 41 U/L (ref 11–51)

## 2019-09-12 LAB — ETHANOL: Alcohol, Ethyl (B): <10 mg/dL (ref <10)

## 2019-09-12 MED ORDER — SODIUM CHLORIDE 0.9% FLUSH: 3.0000 mL | Freq: Once | INTRAVENOUS | Status: DC

## 2019-09-12 NOTE — ED Triage Notes (Signed)
Patient c.o diarrhea, SOB, bilateral leg swelling,and confusion.  Patient stated, "I had a miter saw and I stared at it for 20 minutes. I thought it was a weird chicken."

## 2019-09-12 NOTE — ED Provider Notes (Signed)
[facility] DEPT Provider Note   CSN: [id number] Arrival date & time: 09/12/19  1623     History Chief Complaint  Patient presents with  . Diarrhea  . Leg Swelling  . Shortness of Breath  . Altered Mental Status    Patrick [name] is a 44 y.o. male.  44 year old with prior medical history as detailed below presents for evaluation of multiple complaints.  Patient reports mild edema to both lower extremities.  Patient also reports feeling intermittent weakness.  He is also complaining of mild intermittent shortness of breath.  He reports that the symptoms all started after recent admission.  Patient denies recent fever.  He denies current nausea or vomiting.  He denies chest pain.  He denies current shortness of breath.  He is concerned that his medications may be causing some of his symptoms.  The history is provided by the patient and medical records.  Illness Location:  Weakness, fatigue, lower extremity edema, shortness of breath Severity:  Moderate Onset quality:  Gradual Duration:  1 week Timing:  Intermittent Progression:  Waxing and waning Chronicity:  New      Past Medical History:  Diagnosis Date  . Eczema   . Hypertension   . Migraine headache     Patient Active Problem List   Diagnosis Date Noted  . Severe sepsis with acute organ dysfunction due to methicillin susceptible Staphylococcus aureus (MSSA) ([place]) 08/29/2019  . Bacteremia due to Staphylococcus 08/29/2019  . Hypokalemia   . Sepsis with acute liver failure without hepatic coma or septic shock ([place])   . Transaminitis   . Alcohol withdrawal ([place]) 08/28/2019  . Closed compression fracture of L2 lumbar vertebra, initial encounter ([place]) 08/26/2017  . Behavior change due to substance use 02/27/2017  . Alcohol use disorder, severe, dependence ([place]) 02/27/2017    Past Surgical History:  Procedure Laterality Date  . BIOPSY  09/04/2019   Procedure: BIOPSY;  Surgeon: Jerline Pain, MD;  Location: [facility] ENDOSCOPY;  Service: Cardiovascular;;  . ESOPHAGOGASTRODUODENOSCOPY (EGD) WITH PROPOFOL  09/04/2019   Procedure: ESOPHAGOGASTRODUODENOSCOPY (EGD) WITH PROPOFOL;  Surgeon: Jerline Pain, MD;  Location: [facility];  Service: Cardiovascular;;  . TEE WITHOUT CARDIOVERSION N/A 09/04/2019   Procedure: TRANSESOPHAGEAL ECHOCARDIOGRAM (TEE);  Surgeon: Jerline Pain, MD;  Location: [facility] ENDOSCOPY;  Service: Cardiovascular;  Laterality: N/A;       Family History  Problem Relation Age of Onset  . Cancer Mother     Social History   Tobacco Use  . Smoking status: Current Every Day Smoker    Packs/day: 1.00    Types: Cigarettes  . Smokeless tobacco: Never Used  Substance Use Topics  . Alcohol use: Not Currently    Comment: every day depends on day of week as to how much  . Drug use: Yes    Types: Marijuana    Comment: every day    Home Medications Prior to Admission medications   Medication Sig Start Date End Date Taking? Authorizing Provider  acetaminophen (TYLENOL) 325 MG tablet Take 3 tablets (975 mg total) by mouth every 6 (six) hours as needed for mild pain. Patient not taking: Reported on 04/10/2018 08/27/17   Saverio Danker, PA-C  lisinopril (PRINIVIL,ZESTRIL) 5 MG tablet Take 1 tablet (5 mg total) by mouth daily. 04/10/18 05/10/18  Curatolo, Adam, DO  methocarbamol (ROBAXIN) 500 MG tablet Take 1 tablet (500 mg total) by mouth every 8 (eight) hours as needed for muscle spasms. Patient not taking: Reported on 04/10/2018  08/27/17   Barnetta Chapel, PA-C  Multiple Vitamin (MULTIVITAMIN PO) Take 1 tablet by mouth daily as needed (nutrition).    [provider]  nicotine (NICODERM CQ - DOSED IN MG/24 HOURS) 21 mg/24hr patch Place 1 patch (21 mg total) onto the skin daily. 09/05/19   Rhetta Mura, MD  pantoprazole (PROTONIX) 40 MG tablet Take 1 tablet (40 mg total) by mouth 2 (two) times daily. 09/05/19   Rhetta Mura, MD  sucralfate (CARAFATE) 1 g tablet  Take 1 tablet (1 g total) by mouth 4 (four) times daily -  with meals and at bedtime. 09/05/19   Rhetta Mura, MD    Allergies    Mushroom extract complex  Review of Systems   Review of Systems  All other systems reviewed and are negative.   Physical Exam Updated Vital Signs BP (!) 139/102 (BP Location: Left Arm)   Pulse 93   Temp 98.6 F (37 C) (Oral)   Resp (!) 25   Ht 5\' 10"  (1.778 m)   Wt 88.5 kg   SpO2 99%   BMI 27.99 kg/m   Physical Exam Vitals and nursing note reviewed.  Constitutional:      General: He is not in acute distress.    Appearance: He is well-developed.  HENT:     Head: Normocephalic and atraumatic.  Eyes:     Conjunctiva/sclera: Conjunctivae normal.     Pupils: Pupils are equal, round, and reactive to light.  Cardiovascular:     Rate and Rhythm: Normal rate and regular rhythm.     Heart sounds: Normal heart sounds.  Pulmonary:     Effort: Pulmonary effort is normal. No respiratory distress.     Breath sounds: Normal breath sounds.  Abdominal:     General: There is no distension.     Palpations: Abdomen is soft.     Tenderness: There is no abdominal tenderness.  Musculoskeletal:        General: No deformity. Normal range of motion.     Cervical back: Normal range of motion and neck supple.  Skin:    General: Skin is warm and dry.  Neurological:     General: No focal deficit present.     Mental Status: He is alert and oriented to person, place, and time.     ED Results / Procedures / Treatments   Labs (all labs ordered are listed, but only abnormal results are displayed) Labs Reviewed  LIPASE, BLOOD  COMPREHENSIVE METABOLIC PANEL  CBC  URINALYSIS, ROUTINE W REFLEX MICROSCOPIC  ETHANOL    EKG None  Radiology DG Chest 2 View  Result Date: 09/12/2019 CLINICAL DATA:  Shortness of breath EXAM: CHEST - 2 VIEW COMPARISON:  09/02/2019 FINDINGS: The heart size and mediastinal contours are within normal limits. Both lungs are  clear. The visualized skeletal structures are unremarkable. IMPRESSION: Normal study. Electronically Signed   By: 11/02/2019 M.D.   On: 09/12/2019 18:24    Procedures Procedures (including critical care time)  Medications Ordered in ED Medications - No data to display  ED Course  I have reviewed the triage vital signs and the nursing notes.  Pertinent labs & imaging results that were available during my care of the patient were reviewed by me and considered in my medical decision making (see chart for details).    MDM Rules/Calculators/A&P                       MDM  Screen complete  Harm Jou was evaluated in Emergency Department on 09/12/2019 for the symptoms described in the history of present illness. He was evaluated in the context of the global COVID-19 pandemic, which necessitated consideration that the patient might be at risk for infection with the SARS-CoV-2 virus that causes COVID-19. Institutional protocols and algorithms that pertain to the evaluation of patients at risk for COVID-19 are in a state of rapid change based on information released by regulatory bodies including the CDC and federal and state organizations. These policies and algorithms were followed during the patient's care in the ED.  Patient is presenting for evaluation of multiple complaints.  In the setting of recent admission for sepsis complicated by transaminitis and chronic alcohol use.  Patient was eventually diagnosed with MSSA bacteremia.  EGD was performed during his recent admission which showed erythematous mucosa in the gastric body.  TEE performed during recent admission did not show vegetation.  Patient is reporting that he is currently only taking Protonix and Carafate.  He is concerned that his symptoms today could be secondary to these medications.  Screening labs will be obtained.   Final disposition dependent upon lab results and re-evaluation by oncoming ED provider.     Final  Clinical Impression(s) / ED Diagnoses Final diagnoses:  Weakness    Rx / DC Orders ED Discharge Orders    None       Wynetta Fines, MD 09/12/19 2342

## 2019-09-12 NOTE — ED Provider Notes (Signed)
Plan to f/u on labs/ekg If unremarkable, he can be discharged home   Zadie Rhine, MD 09/12/19 2321

## 2019-09-13 LAB — URINALYSIS, ROUTINE W REFLEX MICROSCOPIC
Bilirubin Urine: NEGATIVE
Glucose, UA: NEGATIVE mg/dL
Hgb urine dipstick: NEGATIVE
Ketones, ur: NEGATIVE mg/dL
Leukocytes,Ua: NEGATIVE
Nitrite: NEGATIVE
Protein, ur: NEGATIVE mg/dL
Specific Gravity, Urine: 1.015 (ref 1.005–1.030)
pH: 5 (ref 5.0–8.0)

## 2019-09-13 NOTE — ED Provider Notes (Signed)
ED ECG REPORT   Date: 09/12/2019 2205  Rate: 95  Rhythm: normal sinus rhythm  QRS Axis: normal  Intervals: normal  ST/T Wave abnormalities: t wave inversion  Conduction Disutrbances:none  Narrative Interpretation:   Old EKG Reviewed: none available and changes noted  I have personally reviewed the EKG tracing and agree with the computerized printout as noted.  Patient stable.  Labs are near baseline.  Patient appears anxious, but otherwise no acute distress.  He is afebrile at 98.8 He has mild edema of the lower extremities, but distal pulses intact, no erythema. Patient thinks all the symptoms are from the Protonix and Carafate.  He would like to stop these.  Advised he needs to be on medication for his GI issues, recommended Pepcid. He has follow-up arranged. Patient is not appear septic.  He does not voice any chest pain.  No hypoxia. Labs reassuring.   Zadie Rhine, MD 09/13/19 [phone]

## 2019-09-13 NOTE — Discharge Instructions (Addendum)

## 2019-09-15 NOTE — Progress Notes (Signed)
Patient ID: Patrick [name], male   DOB: 09-27-1975, 44 y.o.   MRN: [id number]     Patrick [name], is a 44 y.o. male  [id number]:[id number]  [name]  DOB - 03/19/1976  Subjective:  Chief Complaint and HPI: Patrick [name] is a 44 y.o. male here today to establish care and for a follow up visit After recent hospitalization 4/29-09/05/2019 for sepsis and complications related to alcoholism.  He is not taking any of the meds he was discharged on.  He is feeling weak.  Appetite is poor but he has been making himself eat.  He says he is no longer drinking but some thought processes are questionable.  Denies abdominal pain.  Some dark stools.  He c/o severe neuropathy B feet when not drinking.  He is using a walker.  He is aware of his f/up appts.  Says he is involved in [place].  From discharge summary: Recommendations for Outpatient Follow-up:  1. High risk for readmission if does not comply with recommendations to cease drinking-I do not think he will comply with medical recommendations 2. Multiple new medications this admission have been called into his [facility] pharmacy including acid reducer and Carafate 3. We will try and arrange a TOC PCP follow-up if he is willing 4. Recommend Chem-12 INR and CBC in about 1 week 5. Recommend repeat PTH in the outpatient setting if there are persistent concerns about hypocalcemia although I think this was related in someway to his chronic ethanolism 6. Patient will need outpatient follow-up with gastroenterology/infectious disease because of need for follow-up for hepatitis C given elevated titers I briefly spoke with Dr. Ilsa Iha of infectious disease but will copy Dr. Ilsa Iha and Dr. Ninetta Lights on this note for care coordination  Discharge Diagnoses:  Principal Problem:   Severe sepsis with acute organ dysfunction due to methicillin susceptible Staphylococcus aureus (MSSA) (HCC) Active Problems:   Alcohol withdrawal (HCC)   Bacteremia due to Staphylococcus   Hypokalemia  Sepsis with acute liver failure without hepatic coma or septic shock (HCC)   Transaminitis  History of present illness:  39-year-old white male known ethanol habituation prior L2 compression fracture 12th rib fracture 08/27/2017, HTN presented to the hospital with 1 week history of generalized increasing weakness and abdominal pain-EMS apparently found him lying outside his home covered in feces, disheveled and with crusted blood around his mouth Lactic acid was 1.1 on admission T-max 101.2? Concern for pneumonia on admission therefore started sepsis protocol coverage There was a transaminitis on admission 239/106--critical care initially consulted and ultimately admitted by hospitalist on 4/29 Found to have severe thrombocytopenia and oncology consulted Eventually found to have MSSA bacteremia   Hospital Course:  MSSA bacteremia TEE no vegetation 5/6 TTE EF 55 to 60% 2-week duration--- unfortunately social barriers prevented him from being able to obtain Ancef reliably therefore I discussed with Dr. Ilsa Iha about his case with MSSA and she recommends oritavancin 3-hour infusion and we can remove the PICC line subsequently prior to patient being discharged Right hip plain films performed on 5/5 shows no overt abscess or fluid collection although there may be a fracture Therapy recommends as an outpatient management with home health PT which we will order  ? Melena Was on IV PPI since taking p.o. will transition to p.o. Endoscopy 5/6 (conjunction TEE) =nl esophagus, erythematous mucosa cardia gastrum fundus-erythematous duodenopathy-recommendation = avoid EtOH, PPI X1 month twice daily I added Carafate as well on discharge  Resolving hypokalemia, hypomagnesemia Both resolved on discharge  Elevated PTH Etiology  unclear-we will need labs including a calcium in the next week week and a half and if this is suppressed may need to consider work-up although I think this is secondary to his  chronic ethanolism may need to consider work-up of hypercalcemia if there are persistent concerns  Severe thrombocytopenia-Nadir 08/30/2019 was 20--counts have since improved to 147 and this was likely secondary to toxic effect of alcohol-oncology signed off 5/5  Hepatitis C reactive HCV log 6.167 Will inbox message to Dr. Ninetta Lights for outpatient coordination of hep C Rx ID has decided to immunize with hepatitis a and B vaccines Will need follow-up at [facility] in about 3 weeks post completion Ancef  Alcoholic transaminitisalcoholic liver disease CIWA Was low and we discontinued protocol however was a little agitated overnight 5/5  Patient resolved over the course of time while here   Hypertension Continue lisinopril 5 daily  Anemia of chronic disease with severe thrombocytopenia FOBT was positive-GIinput as aboveno reports of further bleeding  Procedures: EGD on 5/6 showed normal esophagus erythematous mucosa in the cardia gastrium gastric body antrum prepylorus which was biopsied and erythematous to adenopathy   TEE performed TTE performed and no vegetations EF was 60-65% without wall motion abnormalities   ED/Hospital notes reviewed.   Social History: has a gf,  Not working  ROS:   Constitutional:  No f/c, No night sweats, No unexplained weight loss. EENT:  No vision changes, No blurry vision, No hearing changes. No mouth, throat, or ear problems.  Respiratory: No cough, No SOB Cardiac: No CP, no palpitations GI:  No abd pain, No N/V/D. GU: No Urinary s/sx Musculoskeletal: No joint pain Neuro: No headache, no dizziness, no motor weakness.  Skin: No rash Endocrine:  No polydipsia. No polyuria.  Psych: Denies SI/HI  No problems updated.  ALLERGIES: Allergies  Allergen Reactions   Mushroom Extract Complex Diarrhea and Other (See Comments)    Itchy throat    PAST MEDICAL HISTORY: Past Medical History:  Diagnosis Date   Eczema    Hypertension    Migraine  headache     MEDICATIONS AT HOME: Prior to Admission medications   Medication Sig Start Date End Date Taking? Authorizing Provider  acetaminophen (TYLENOL) 325 MG tablet Take 3 tablets (975 mg total) by mouth every 6 (six) hours as needed for mild pain. Patient not taking: Reported on 04/10/2018 08/27/17   Barnetta Chapel, PA-C  gabapentin (NEURONTIN) 300 MG capsule Take 1 capsule (300 mg total) by mouth 3 (three) times daily. 09/17/19   Anders Simmonds, PA-C  methocarbamol (ROBAXIN) 500 MG tablet Take 1 tablet (500 mg total) by mouth every 8 (eight) hours as needed for muscle spasms. Patient not taking: Reported on 04/10/2018 08/27/17   Barnetta Chapel, PA-C  Multiple Vitamin (MULTIVITAMIN PO) Take 1 tablet by mouth daily as needed (nutrition).    [provider]  nicotine (NICODERM CQ - DOSED IN MG/24 HOURS) 21 mg/24hr patch Place 1 patch (21 mg total) onto the skin daily. Patient not taking: Reported on 09/16/2019 09/05/19   Rhetta Mura, MD  omeprazole (PRILOSEC) 20 MG capsule Take 1 capsule (20 mg total) by mouth daily. 09/17/19   Anders Simmonds, PA-C     Objective:  EXAM:   Vitals:   09/17/19 0920  BP: 92/62  Pulse: (!) 108  SpO2: 99%  Weight: 179 lb 6.4 oz (81.4 kg)  Height: 5\' 11"  (1.803 m)    General appearance : A&OX3. NAD. Non-toxic-appearing; does not appear well;  Appears  weak.  Room smells of alcohol but hand sanitizer also recently dispensed HEENT: Atraumatic and Normocephalic.  PERRLA. EOM intact.   Neck: supple, no JVD. No cervical lymphadenopathy. No thyromegaly Chest/Lungs:  Breathing-non-labored, Good air entry bilaterally, breath sounds normal without rales, rhonchi, or wheezing  CVS: S1 S2 regular, no murmurs, gallops, rubs  Extremities: Bilateral Lower Ext shows no edema, both legs are warm to touch with = pulse throughout Neurology:  CN II-XII grossly intact, Non focal.   Psych:  TP linear. J/I fair. Slightly slowed speech. Appropriate eye  contact and blunted affect.  Skin:  No Rash  Data Review No results found for: HGBA1C   Assessment & Plan   1. Chronic alcoholic gastritis without hemorrhage I have counseled the patient at length about substance abuse and addiction.  12 step meetings/recovery recommended.  Local 12 step meeting lists were given and attendance was encouraged.  Patient expresses understanding.  - Comprehensive metabolic panel - CBC with Differential/Platelet - omeprazole (PRILOSEC) 20 MG capsule; Take 1 capsule (20 mg total) by mouth daily.  Dispense: 30 capsule; Refill: 3  2. Bacteremia due to Staphylococcus Completed antibiotics;  No fevers  3. Transaminitis Check CMP  4. Alcohol use disorder, severe, dependence ([place]) - Ambulatory referral to Social Work I have counseled the patient at length about substance abuse and addiction.  12 step meetings/recovery recommended.  Local 12 step meeting lists were given and attendance was encouraged.  Patient expresses understanding.    5. Hypokalemia - Comprehensive metabolic panel  6. Hypomagnesemia - Magnesium  7. Neuropathy - Comprehensive metabolic panel - gabapentin (NEURONTIN) 300 MG capsule; Take 1 capsule (300 mg total) by mouth 3 (three) times daily.  Dispense: 90 capsule; Refill: 3  8. Hospital discharge follow-up  9. Hypocalcemia - PTH, Intact and Calcium  Patient have been counseled extensively about nutrition and exercise  Return in about 2 weeks (around 10/01/2019) for assign PCP.  The patient was given clear instructions to go to ER or return to medical center if symptoms don't improve, worsen or new problems develop. The patient verbalized understanding. The patient was told to call to get lab results if they haven't heard anything in the next week.     Freeman Caldron, PA-C [facility] [place], [place]   09/17/2019, 9:40 AM

## 2019-09-16 ENCOUNTER — Other Ambulatory Visit: Payer: Self-pay

## 2019-09-16 ENCOUNTER — Encounter: Payer: Self-pay | Admitting: Infectious Diseases

## 2019-09-16 ENCOUNTER — Telehealth: Payer: Self-pay | Admitting: Pharmacy Technician

## 2019-09-16 ENCOUNTER — Ambulatory Visit (INDEPENDENT_AMBULATORY_CARE_PROVIDER_SITE_OTHER): Payer: Self-pay | Admitting: Infectious Diseases

## 2019-09-16 DIAGNOSIS — F1020 Alcohol dependence, uncomplicated: Secondary | ICD-10-CM

## 2019-09-16 DIAGNOSIS — B182 Chronic viral hepatitis C: Secondary | ICD-10-CM

## 2019-09-16 DIAGNOSIS — R7881 Bacteremia: Secondary | ICD-10-CM

## 2019-09-16 DIAGNOSIS — K2970 Gastritis, unspecified, without bleeding: Secondary | ICD-10-CM | POA: Insufficient documentation

## 2019-09-16 DIAGNOSIS — K2920 Alcoholic gastritis without bleeding: Secondary | ICD-10-CM

## 2019-09-16 DIAGNOSIS — B958 Unspecified staphylococcus as the cause of diseases classified elsewhere: Secondary | ICD-10-CM

## 2019-09-16 NOTE — Assessment & Plan Note (Signed)
Continue protonix.  Check CBC F/u PCP

## 2019-09-16 NOTE — Assessment & Plan Note (Signed)
Will repeat his BCx He states he did not get IV anbx after d/c.

## 2019-09-16 NOTE — Telephone Encounter (Signed)
[facility] Patient Advocate Encounter    Findings of the benefits investigation:   Insurance: uninsured  [facility] Patient Advocate confirmed that this information was accurate at the patient's appointment and had him sign both applications for assistance. He prefers [facility] to clinic.   Beulah Gandy, CPhT [facility] Phone: [phone] Fax: [phone] 09/16/2019 2:25 PM

## 2019-09-16 NOTE — Assessment & Plan Note (Signed)
Will have him meet with pharm for consideration for tx.  Will check BAC Continue Hep A/B vax series. Asked him to stop OTC water pill.  rtc in 1 month He has 2 PCP evals in that period.

## 2019-09-16 NOTE — Addendum Note (Signed)
Addended by: Rayjon Wery, Mid[place] C on: 09/16/2019 02:34 PM   Modules accepted: Orders

## 2019-09-16 NOTE — Assessment & Plan Note (Signed)
Will check BAC today.

## 2019-09-16 NOTE — Progress Notes (Signed)
   Subjective:    Patient ID: Patrick [name], male    DOB: 08-05-1975, 44 y.o.   MRN: [id number]  HPI 44 y.o. male with htn, with heavy etoh use, was admitted on 4/29 where he reports was down on the ground for sometime after slipping out of his chair and injuring right hip, while intoxicated. He reports falling more than once. He also noticed having dark,coffee ground emesis in the week prior to admit. EMS found patient soiled clothing with urine and stool. Concern whether he may also have had seizure. He was found to have temp of 102F plus tachyardia and was found to have MSSA bacteremia. His course was complicated by melena, anemia, and he underwent EGD. This showed gastritis.  He had TEE (-) and repeat BCx (-).  He was treated with ancef for 14 days.  He was also found to have hep C 1b  Hepatitis C RNA quantitative Latest Ref Rng & Units 08/31/2019  HCV Quantitative >50 IU/mL 1,470,000  HCV Quantitative Log >1.70 log10 IU/mL 6.167    Today he is upset that he went to the hospital last week and that he has Hep C. He is also concerned that he has liver damage, kidney damage, pancreatitis.  He has swelling in his legs 3x normal. He started himself on water pills he got from [employer]. Diurex?  Denies ETOH since he was in hospital.   The past medical history, family history and social history were reviewed/updated in EPIC  Review of Systems  Constitutional: Negative for appetite change and unexpected weight change.  HENT: Positive for sore throat.   Respiratory: Positive for shortness of breath.   Cardiovascular: Positive for leg swelling.  Gastrointestinal: Positive for blood in stool. Negative for constipation and diarrhea.  Neurological: Positive for dizziness and light-headedness.  Please see HPI. All other systems reviewed and negative. Feels short of breath and thinks an inhaler might help. He has never used an inhaler prior.      Objective:   Physical Exam Vitals reviewed.   Constitutional:      Appearance: Normal appearance. He is not ill-appearing, toxic-appearing or diaphoretic.  HENT:     Mouth/Throat:     Mouth: Mucous membranes are moist.     Pharynx: No oropharyngeal exudate.  Eyes:     Extraocular Movements: Extraocular movements intact.     Pupils: Pupils are equal, round, and reactive to light.  Cardiovascular:     Rate and Rhythm: Normal rate and regular rhythm.  Pulmonary:     Effort: Pulmonary effort is normal.     Breath sounds: Normal breath sounds.  Abdominal:     General: Bowel sounds are normal. There is no distension.     Palpations: Abdomen is soft.     Tenderness: There is no abdominal tenderness. There is no right CVA tenderness or left CVA tenderness.  Musculoskeletal:     Cervical back: Normal range of motion and neck supple.     Right lower leg: No edema.     Left lower leg: No edema.  Neurological:     Mental Status: He is alert.  Psychiatric:        Mood and Affect: Mood is anxious.     Comments: Query if he is intoxicated- faint smell of ETOH.            Assessment & Plan:

## 2019-09-17 ENCOUNTER — Ambulatory Visit: Payer: Self-pay | Attending: Family Medicine | Admitting: Physician Assistant

## 2019-09-17 ENCOUNTER — Other Ambulatory Visit: Payer: Self-pay

## 2019-09-17 VITALS — BP 92/62 | HR 108 | Ht 71.0 in | Wt 179.4 lb

## 2019-09-17 DIAGNOSIS — R7401 Elevation of levels of liver transaminase levels: Secondary | ICD-10-CM

## 2019-09-17 DIAGNOSIS — K2920 Alcoholic gastritis without bleeding: Secondary | ICD-10-CM

## 2019-09-17 DIAGNOSIS — G629 Polyneuropathy, unspecified: Secondary | ICD-10-CM

## 2019-09-17 DIAGNOSIS — E876 Hypokalemia: Secondary | ICD-10-CM

## 2019-09-17 DIAGNOSIS — B958 Unspecified staphylococcus as the cause of diseases classified elsewhere: Secondary | ICD-10-CM

## 2019-09-17 DIAGNOSIS — Z09 Encounter for follow-up examination after completed treatment for conditions other than malignant neoplasm: Secondary | ICD-10-CM

## 2019-09-17 DIAGNOSIS — F1020 Alcohol dependence, uncomplicated: Secondary | ICD-10-CM

## 2019-09-17 DIAGNOSIS — R7881 Bacteremia: Secondary | ICD-10-CM

## 2019-09-17 MED ORDER — GABAPENTIN 300 MG PO CAPS: 300.0000 mg | ORAL_CAPSULE | Freq: Three times a day (TID) | ORAL | 3 refills | Status: DC

## 2019-09-17 MED ORDER — OMEPRAZOLE 20 MG PO CPDR: 20.0000 mg | DELAYED_RELEASE_CAPSULE | Freq: Every day | ORAL | 3 refills | Status: DC

## 2019-09-17 MED FILL — OMEPRAZOLE 20 MG CAP: 20 | 30 days supply | Qty: 30 | Fill #0

## 2019-09-17 MED FILL — GABAPENTIN 300 MG CAPSULE: 300 | 30 days supply | Qty: 90 | Fill #0

## 2019-09-17 NOTE — Progress Notes (Signed)
Patient is having pain from waist down to feet, states that he can barley walk.

## 2019-09-17 NOTE — Patient Instructions (Addendum)
[email or web address] is the website for alcoholics anonymous.    'drink 80-100 ounces of water daily.  Eat frequent small meals.  Neuropathic Pain Neuropathic pain is pain caused by damage to the nerves that are responsible for certain sensations in your body (sensory nerves). The pain can be caused by:  Damage to the sensory nerves that send signals to your spinal cord and brain (peripheral nervous system).  Damage to the sensory nerves in your brain or spinal cord (central nervous system). Neuropathic pain can make you more sensitive to pain. Even a minor sensation can feel very painful. This is usually a long-term condition that can be difficult to treat. The type of pain differs from person to person. It may:  Start suddenly (acute), or it may develop slowly and last for a long time (chronic).  Come and go as damaged nerves heal, or it may stay at the same level for years.  Cause emotional distress, loss of sleep, and a lower quality of life. What are the causes? The most common cause of this condition is diabetes. Many other diseases and conditions can also cause neuropathic pain. Causes of neuropathic pain can be classified as:  Toxic. This is caused by medicines and chemicals. The most common cause of toxic neuropathic pain is damage from cancer treatments (chemotherapy).  Metabolic. This can be caused by: ? Diabetes. This is the most common disease that damages the nerves. ? Lack of vitamin B from long-term alcohol abuse.  Traumatic. Any injury that cuts, crushes, or stretches a nerve can cause damage and pain. A common example is feeling pain after losing an arm or leg (phantom limb pain).  Compression-related. If a sensory nerve gets trapped or compressed for a long period of time, the blood supply to the nerve can be cut off.  Vascular. Many blood vessel diseases can cause neuropathic pain by decreasing blood supply and oxygen to nerves.  Autoimmune. This type of pain results from  diseases in which the body's defense system (immune system) mistakenly attacks sensory nerves. Examples of autoimmune diseases that can cause neuropathic pain include lupus and multiple sclerosis.  Infectious. Many types of viral infections can damage sensory nerves and cause pain. Shingles infection is a common cause of this type of pain.  Inherited. Neuropathic pain can be a symptom of many diseases that are passed down through families (genetic). What increases the risk? You are more likely to develop this condition if:  You have diabetes.  You smoke.  You drink too much alcohol.  You are taking certain medicines, including medicines that kill cancer cells (chemotherapy) or that treat immune system disorders. What are the signs or symptoms? The main symptom is pain. Neuropathic pain is often described as:  Burning.  Shock-like.  Stinging.  Hot or cold.  Itching. How is this diagnosed? No single test can diagnose neuropathic pain. It is diagnosed based on:  Physical exam and your symptoms. Your health care provider will ask you about your pain. You may be asked to use a pain scale to describe how bad your pain is.  Tests. These may be done to see if you have a high sensitivity to pain and to help find the cause and location of any sensory nerve damage. They include: ? Nerve conduction studies to test how well nerve signals travel through your sensory nerves (electrodiagnostic testing). ? Stimulating your sensory nerves through electrodes on your skin and measuring the response in your spinal cord and brain (somatosensory evoked potential).  Imaging studies, such as: ? X-rays. ? CT scan. ? MRI. How is this treated? Treatment for neuropathic pain may change over time. You may need to try different treatment options or a combination of treatments. Some options include:  Treating the underlying cause of the neuropathy, such as diabetes, kidney disease, or vitamin  deficiencies.  Stopping medicines that can cause neuropathy, such as chemotherapy.  Medicine to relieve pain. Medicines may include: ? Prescription or over-the-counter pain medicine. ? Anti-seizure medicine. ? Antidepressant medicines. ? Pain-relieving patches that are applied to painful areas of skin. ? A medicine to numb the area (local anesthetic), which can be injected as a nerve block.  Transcutaneous nerve stimulation. This uses electrical currents to block painful nerve signals. The treatment is painless.  Alternative treatments, such as: ? Acupuncture. ? Meditation. ? Massage. ? Physical therapy. ? Pain management programs. ? Counseling. Follow these instructions at home: Medicines   Take over-the-counter and prescription medicines only as told by your health care provider.  Do not drive or use heavy machinery while taking prescription pain medicine.  If you are taking prescription pain medicine, take actions to prevent or treat constipation. Your health care provider may recommend that you: ? Drink enough fluid to keep your urine pale yellow. ? Eat foods that are high in fiber, such as fresh fruits and vegetables, whole grains, and beans. ? Limit foods that are high in fat and processed sugars, such as fried or sweet foods. ? Take an over-the-counter or prescription medicine for constipation. Lifestyle   Have a good support system at home.  Consider joining a chronic pain support group.  Do not use any products that contain nicotine or tobacco, such as cigarettes and e-cigarettes. If you need help quitting, ask your health care provider.  Do not drink alcohol. General instructions  Learn as much as you can about your condition.  Work closely with all your health care providers to find the treatment plan that works best for you.  Ask your health care provider what activities are safe for you.  Keep all follow-up visits as told by your health care provider.  This is important. Contact a health care provider if:  Your pain treatments are not working.  You are having side effects from your medicines.  You are struggling with tiredness (fatigue), mood changes, depression, or anxiety. Summary  Neuropathic pain is pain caused by damage to the nerves that are responsible for certain sensations in your body (sensory nerves).  Neuropathic pain may come and go as damaged nerves heal, or it may stay at the same level for years.  Neuropathic pain is usually a long-term condition that can be difficult to treat. Consider joining a chronic pain support group. This information is not intended to replace advice given to you by your health care provider. Make sure you discuss any questions you have with your health care provider. Document Revised: 08/08/2018 Document Reviewed: 05/04/2017 Elsevier Patient Education  2020 [employer].

## 2019-09-18 ENCOUNTER — Other Ambulatory Visit: Payer: Self-pay | Admitting: Physician Assistant

## 2019-09-18 DIAGNOSIS — E876 Hypokalemia: Secondary | ICD-10-CM

## 2019-09-18 LAB — CBC WITH DIFFERENTIAL/PLATELET
Basophils Absolute: 0 10*3/uL (ref 0.0–0.2)
Basos: 0 %
EOS (ABSOLUTE): 0.2 10*3/uL (ref 0.0–0.4)
Eos: 2 %
Hematocrit: 31.8 % — ABNORMAL LOW (ref 37.5–51.0)
Hemoglobin: 10.6 g/dL — ABNORMAL LOW (ref 13.0–17.7)
Immature Grans (Abs): 0.1 10*3/uL (ref 0.0–0.1)
Immature Granulocytes: 1 %
Lymphocytes Absolute: 2.3 10*3/uL (ref 0.7–3.1)
Lymphs: 20 %
MCH: 34.3 pg — ABNORMAL HIGH (ref 26.6–33.0)
MCHC: 33.3 g/dL (ref 31.5–35.7)
MCV: 103 fL — ABNORMAL HIGH (ref 79–97)
Monocytes Absolute: 0.5 10*3/uL (ref 0.1–0.9)
Monocytes: 4 %
Neutrophils Absolute: 8.4 10*3/uL — ABNORMAL HIGH (ref 1.4–7.0)
Neutrophils: 73 %
Platelets: 281 10*3/uL (ref 150–450)
RBC: 3.09 x10E6/uL — ABNORMAL LOW (ref 4.14–5.80)
RDW: 13.4 % (ref 11.6–15.4)
WBC: 11.5 10*3/uL — ABNORMAL HIGH (ref 3.4–10.8)

## 2019-09-18 LAB — COMPREHENSIVE METABOLIC PANEL
ALT: 21 IU/L (ref 0–44)
AST: 57 IU/L — ABNORMAL HIGH (ref 0–40)
Albumin/Globulin Ratio: 1.1 — ABNORMAL LOW (ref 1.2–2.2)
Albumin: 3.2 g/dL — ABNORMAL LOW (ref 4.0–5.0)
Alkaline Phosphatase: 105 IU/L (ref 48–121)
BUN/Creatinine Ratio: 4 — ABNORMAL LOW (ref 9–20)
BUN: 5 mg/dL — ABNORMAL LOW (ref 6–24)
Bilirubin Total: 0.7 mg/dL (ref 0.0–1.2)
CO2: 14 mmol/L — ABNORMAL LOW (ref 20–29)
Calcium: 8.3 mg/dL — ABNORMAL LOW (ref 8.7–10.2)
Chloride: 109 mmol/L — ABNORMAL HIGH (ref 96–106)
Creatinine, Ser: 1.33 mg/dL — ABNORMAL HIGH (ref 0.76–1.27)
GFR calc Af Amer: 75 mL/min/{1.73_m2} (ref >59)
GFR calc non Af Amer: 65 mL/min/{1.73_m2} (ref >59)
Globulin, Total: 3 g/dL (ref 1.5–4.5)
Glucose: 102 mg/dL — ABNORMAL HIGH (ref 65–99)
Potassium: 3 mmol/L — ABNORMAL LOW (ref 3.5–5.2)
Sodium: 142 mmol/L (ref 134–144)
Total Protein: 6.2 g/dL (ref 6.0–8.5)

## 2019-09-18 LAB — PTH, INTACT AND CALCIUM: PTH: 24 pg/mL (ref 15–65)

## 2019-09-18 LAB — MAGNESIUM: Magnesium: 1.7 mg/dL (ref 1.6–2.3)

## 2019-09-18 MED ORDER — POTASSIUM CHLORIDE CRYS ER 20 MEQ PO TBCR: 20.0000 meq | EXTENDED_RELEASE_TABLET | Freq: Every day | ORAL | 3 refills | Status: DC

## 2019-09-18 MED FILL — POTASSIUM CL ER 20 MEQ TABL: 20 | 30 days supply | Qty: 30 | Fill #0

## 2019-09-22 LAB — CULTURE, BLOOD (SINGLE)
MICRO NUMBER:: 10492167
Result:: NO GROWTH
SPECIMEN QUALITY:: ADEQUATE

## 2019-09-27 LAB — COMPREHENSIVE METABOLIC PANEL
AG Ratio: 0.9 (calc) — ABNORMAL LOW (ref 1.0–2.5)
ALT: 23 U/L (ref 9–46)
AST: 52 U/L — ABNORMAL HIGH (ref 10–40)
Albumin: 2.8 g/dL — ABNORMAL LOW (ref 3.6–5.1)
Alkaline phosphatase (APISO): 89 U/L (ref 36–130)
BUN/Creatinine Ratio: 5 (calc) — ABNORMAL LOW (ref 6–22)
BUN: 6 mg/dL — ABNORMAL LOW (ref 7–25)
CO2: 16 mmol/L — ABNORMAL LOW (ref 20–32)
Calcium: 8.2 mg/dL — ABNORMAL LOW (ref 8.6–10.3)
Chloride: 112 mmol/L — ABNORMAL HIGH (ref 98–110)
Creat: 1.12 mg/dL (ref 0.60–1.35)
Globulin: 3.2 g/dL (calc) (ref 1.9–3.7)
Glucose, Bld: 131 mg/dL — ABNORMAL HIGH (ref 65–99)
Potassium: 3.4 mmol/L — ABNORMAL LOW (ref 3.5–5.3)
Sodium: 141 mmol/L (ref 135–146)
Total Bilirubin: 1 mg/dL (ref 0.2–1.2)
Total Protein: 6 g/dL — ABNORMAL LOW (ref 6.1–8.1)

## 2019-09-27 LAB — CULTURE, BLOOD (SINGLE)
MICRO NUMBER:: 10492168
Result:: NO GROWTH
SPECIMEN QUALITY:: ADEQUATE

## 2019-09-27 LAB — CBC
HCT: 30.1 % — ABNORMAL LOW (ref 38.5–50.0)
Hemoglobin: 9.7 g/dL — ABNORMAL LOW (ref 13.2–17.1)
MCH: 33.2 pg — ABNORMAL HIGH (ref 27.0–33.0)
MCHC: 32.2 g/dL (ref 32.0–36.0)
MCV: 103.1 fL — ABNORMAL HIGH (ref 80.0–100.0)
MPV: 10 fL (ref 7.5–12.5)
Platelets: 295 10*3/uL (ref 140–400)
RBC: 2.92 10*6/uL — ABNORMAL LOW (ref 4.20–5.80)
RDW: 13.6 % (ref 11.0–15.0)
WBC: 10.5 10*3/uL (ref 3.8–10.8)

## 2019-09-27 LAB — LIVER FIBROSIS, FIBROTEST-ACTITEST
ALT: 21 U/L (ref 9–46)
Alpha-2-Macroglobulin: 212 mg/dL (ref 106–279)
Apolipoprotein A1: 80 mg/dL — ABNORMAL LOW (ref 94–176)
Bilirubin: 1 mg/dL (ref 0.2–1.2)
Fibrosis Score: 0.71
GGT: 272 U/L — ABNORMAL HIGH (ref 3–95)
Haptoglobin: 149 mg/dL (ref 43–212)
Necroinflammat ACT Score: 0.15
Reference ID: 3407882

## 2019-10-06 ENCOUNTER — Telehealth (INDEPENDENT_AMBULATORY_CARE_PROVIDER_SITE_OTHER): Payer: Self-pay | Admitting: Primary Care

## 2019-10-07 ENCOUNTER — Ambulatory Visit: Payer: Self-pay | Admitting: Infectious Diseases

## 2019-10-09 ENCOUNTER — Encounter: Payer: Self-pay | Admitting: Infectious Diseases

## 2019-10-09 ENCOUNTER — Other Ambulatory Visit: Payer: Self-pay

## 2019-10-09 ENCOUNTER — Ambulatory Visit (INDEPENDENT_AMBULATORY_CARE_PROVIDER_SITE_OTHER): Payer: Self-pay | Admitting: Infectious Diseases

## 2019-10-09 DIAGNOSIS — F10231 Alcohol dependence with withdrawal delirium: Secondary | ICD-10-CM

## 2019-10-09 DIAGNOSIS — F10931 Alcohol use, unspecified with withdrawal delirium: Secondary | ICD-10-CM

## 2019-10-09 DIAGNOSIS — B182 Chronic viral hepatitis C: Secondary | ICD-10-CM

## 2019-10-09 DIAGNOSIS — B958 Unspecified staphylococcus as the cause of diseases classified elsewhere: Secondary | ICD-10-CM

## 2019-10-09 DIAGNOSIS — L700 Acne vulgaris: Secondary | ICD-10-CM

## 2019-10-09 DIAGNOSIS — E876 Hypokalemia: Secondary | ICD-10-CM

## 2019-10-09 DIAGNOSIS — R7881 Bacteremia: Secondary | ICD-10-CM

## 2019-10-09 NOTE — Assessment & Plan Note (Signed)
Greatly appreciate pharm assistance.  Need to complete his Hep A/B series.  Will see him back in 6 weeks He needs dental but would not qualify for [facility]

## 2019-10-09 NOTE — Assessment & Plan Note (Signed)
States he is still drinking minimal

## 2019-10-09 NOTE — Progress Notes (Signed)
   Subjective:    Patient ID: Patrick [name], male    DOB: [birth year], 44 y.o.   MRN: [id number]  HPI 45 y.o.malewith htn, with heavy etoh use, was admitted on 4/29 where he reports was down on the ground for sometime after slipping out of his chair and injuring right hip, while intoxicated.He reports falling more than once. He also noticed having dark,coffee ground emesis in the week prior to admit.EMS found patient soiled clothing with urine and stool. Concern whether he may also have had seizure. He was found to have temp of 102F plus tachyardia and was found to have MSSA bacteremia. His course was complicated by melena, anemia, and he underwent EGD. This showed gastritis.  He had TEE (-) and repeat BCx (-).  He was treated with ancef for 14 days.  He was also found to have hep C 1b. F3.  He had ID f/u 5-18 and had repeat BCx (-).   Today complains of pain in his feet. He also c/o insmonia. 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   Review of Systems  Constitutional: Negative for appetite change, chills, fever and unexpected weight change.  Gastrointestinal: Negative for blood in stool.  Genitourinary: Negative for difficulty urinating.  Psychiatric/Behavioral: Positive for sleep disturbance.  Please see HPI. All other systems reviewed and negative.      Objective:   Physical Exam Vitals reviewed.  Constitutional:      Appearance: Normal appearance.  HENT:     Mouth/Throat:     Mouth: Mucous membranes are moist.     Pharynx: No oropharyngeal exudate.  Eyes:     Extraocular Movements: Extraocular movements intact.     Pupils: Pupils are equal, round, and reactive to light.  Cardiovascular:     Rate and Rhythm: Normal rate and regular rhythm.  Pulmonary:     Effort: Pulmonary effort is normal.     Breath sounds: Normal breath sounds.  Abdominal:     General: Bowel sounds are normal. There is no distension.     Palpations: Abdomen is soft.     Tenderness: There is no  abdominal tenderness.  Musculoskeletal:     Cervical back: Normal range of motion and neck supple.     Right lower leg: No edema.     Left lower leg: No edema.  Neurological:     Mental Status: He is alert.  Psychiatric:        Mood and Affect: Mood normal.           Assessment & Plan:

## 2019-10-09 NOTE — Assessment & Plan Note (Signed)
Repeat BCx are (-).

## 2019-10-09 NOTE — Assessment & Plan Note (Signed)
He will f/u with [facility].  On supplementation

## 2019-10-10 DIAGNOSIS — L709 Acne, unspecified: Secondary | ICD-10-CM | POA: Insufficient documentation

## 2019-10-10 NOTE — Assessment & Plan Note (Signed)
He has a ~ 0.5 cm boil on his L cheek. I offered to give him anbx to help clear this up but states he would simply "pop" it in the next 24h.  I asked him to let me know if he has any difficulties with this (worsening swelling).

## 2019-10-22 ENCOUNTER — Telehealth: Payer: Self-pay | Admitting: Pharmacy Technician

## 2019-10-22 NOTE — Telephone Encounter (Addendum)
[facility] Patient Advocate Encounter  Completed and sent MY[employer] application for Mavyret for this patient who is uninsured.    Patient assistance phone number for follow up is [phone]  He is APPROVED through 04/23/2020.  He prefers having the medication shipped to the clinic.  This encounter will be updated until final determination.   Netty Starring. Dimas Aguas CPhT Specialty Pharmacy Patient [facility] for Infectious Disease Phone: [phone] Fax:  [phone]

## 2019-10-27 ENCOUNTER — Telehealth: Payer: Self-pay | Admitting: Pharmacy Technician

## 2019-10-27 NOTE — Telephone Encounter (Signed)
[facility] Patient Advocate Encounter  Patient's medication Mavyret will be shipped to the clinic for them to pick up here.  Estimated delivery is November 05, 2019.  Netty Starring. Dimas Aguas CPhT Specialty Pharmacy Patient [facility] for Infectious Disease Phone: [phone] Fax:  [phone]

## 2019-11-06 ENCOUNTER — Ambulatory Visit: Payer: Self-pay | Admitting: Physician Assistant

## 2019-11-06 ENCOUNTER — Encounter: Payer: Self-pay | Admitting: Licensed Clinical Social Worker

## 2019-11-10 NOTE — Telephone Encounter (Signed)
Medication is here for pick up.  I left a HIPPA compliant voicemail asking for him to call back.  When he does, he will need to come in to pick up and meet with the pharmacist (in person or over the phone for medication counseling).  Netty Starring. Dimas Aguas CPhT Specialty Pharmacy Patient [facility] for Infectious Disease Phone: [phone] Fax:  [phone]

## 2019-11-25 ENCOUNTER — Encounter: Payer: Self-pay | Admitting: Pharmacy Technician

## 2019-12-01 ENCOUNTER — Telehealth: Payer: Self-pay

## 2019-12-01 MED ORDER — MAVYRET 100-40 MG PO TABS: 3.0000 | ORAL_TABLET | Freq: Every day | ORAL | 1 refills | Status: AC

## 2019-12-01 NOTE — Telephone Encounter (Signed)
Patient states he started Mavyret on Thursday and symptoms of increased irritability, jittery loss of appetite, skin feels warms to touch and he is sleeping for 12 hours during the day.   He is asking if this is normal and if so he would like to switch medications.  Please advise.    Laurell Josephs, RN

## 2019-12-01 NOTE — Telephone Encounter (Signed)
Spoke with patient over the phone today. He is having nausea with decreased appetite, fatigue, increased irritability/agitation and temper, and increased blood pressure (he thinks, he does not monitor at home). I discussed side effects of Mavyret including fatigue and nausea. I explained that I honestly did not think the agitation, blood pressure, or anger were from the medication. He has a history of alcohol-induced gastritis and electrolyte abnormalities.  He states that he is not drinking alcohol anymore.  He has a video/phone visit with [facility] on Thursday. I asked him to let them know about what all is happening and that I told him that I did not think they were due to Mavyret. I offered to send in Zofran for his nausea and decreased appetite, but he has no income at the moment. I asked him to ask [facility] if they could help him acquire Zofran at a lower cost. Also asked him to power through with the fatigue as that tends to get better the longer you take Mavyret. He states his main concern is his blood pressure and anger and he will discuss with his PCP on Thursday.  All question answered.

## 2019-12-01 NOTE — Telephone Encounter (Signed)
Patient called back requesting to speak with someone; extremely concerned about what he's experiencing. Forwarding to pharmacist for review.   Valmai Vandenberghe Loyola Mast, RN

## 2019-12-02 NOTE — Telephone Encounter (Signed)
Thanks Cassie 

## 2019-12-08 ENCOUNTER — Encounter (HOSPITAL_COMMUNITY): Payer: Self-pay | Admitting: Emergency Medicine

## 2019-12-08 ENCOUNTER — Other Ambulatory Visit: Payer: Self-pay

## 2019-12-08 ENCOUNTER — Emergency Department (HOSPITAL_COMMUNITY)
Admission: EM | Admit: 2019-12-08 | Discharge: 2019-12-08 | Disposition: A | Discharge status: Home / Self Care | Payer: Self-pay | Attending: Emergency Medicine | Admitting: Emergency Medicine

## 2019-12-08 DIAGNOSIS — G40909 Epilepsy, unspecified, not intractable, without status epilepticus: Secondary | ICD-10-CM | POA: Insufficient documentation

## 2019-12-08 DIAGNOSIS — R5383 Other fatigue: Secondary | ICD-10-CM | POA: Insufficient documentation

## 2019-12-08 DIAGNOSIS — R531 Weakness: Secondary | ICD-10-CM | POA: Insufficient documentation

## 2019-12-08 DIAGNOSIS — Z5321 Procedure and treatment not carried out due to patient leaving prior to being seen by health care provider: Secondary | ICD-10-CM | POA: Insufficient documentation

## 2019-12-08 DIAGNOSIS — F10129 Alcohol abuse with intoxication, unspecified: Secondary | ICD-10-CM | POA: Insufficient documentation

## 2019-12-08 NOTE — ED Notes (Signed)
Pt walked out , triage tech stated to sort tech that the pt was not willing to stay

## 2019-12-08 NOTE — ED Triage Notes (Addendum)
Patient arrived with EMS from home , patient reported 2 episodes of seizure this evening , intoxicated with ETOH , patient arrived with service dog , alert and oriented/denies pain , respirations unlabored . Patient added fatigue and gen.weakness. CBG= 166 by EMS. Patient refused blood tests at triage and advised phlebotomist that he is leaving .

## 2019-12-10 ENCOUNTER — Emergency Department (HOSPITAL_COMMUNITY)
Admission: EM | Admit: 2019-12-10 | Discharge: 2019-12-10 | Disposition: A | Discharge status: Home / Self Care | Payer: Self-pay | Attending: Emergency Medicine | Admitting: Emergency Medicine

## 2019-12-10 ENCOUNTER — Encounter (HOSPITAL_COMMUNITY): Payer: Self-pay | Admitting: Emergency Medicine

## 2019-12-10 ENCOUNTER — Other Ambulatory Visit: Payer: Self-pay

## 2019-12-10 DIAGNOSIS — H20051 Hypopyon, right eye: Secondary | ICD-10-CM | POA: Insufficient documentation

## 2019-12-10 DIAGNOSIS — E876 Hypokalemia: Secondary | ICD-10-CM | POA: Insufficient documentation

## 2019-12-10 DIAGNOSIS — H16001 Unspecified corneal ulcer, right eye: Secondary | ICD-10-CM | POA: Insufficient documentation

## 2019-12-10 DIAGNOSIS — D696 Thrombocytopenia, unspecified: Secondary | ICD-10-CM | POA: Insufficient documentation

## 2019-12-10 DIAGNOSIS — R112 Nausea with vomiting, unspecified: Secondary | ICD-10-CM | POA: Insufficient documentation

## 2019-12-10 DIAGNOSIS — R1084 Generalized abdominal pain: Secondary | ICD-10-CM | POA: Insufficient documentation

## 2019-12-10 DIAGNOSIS — I10 Essential (primary) hypertension: Secondary | ICD-10-CM | POA: Insufficient documentation

## 2019-12-10 DIAGNOSIS — F17210 Nicotine dependence, cigarettes, uncomplicated: Secondary | ICD-10-CM | POA: Insufficient documentation

## 2019-12-10 LAB — URINALYSIS, ROUTINE W REFLEX MICROSCOPIC
Bilirubin Urine: NEGATIVE
Glucose, UA: NEGATIVE mg/dL
Hgb urine dipstick: NEGATIVE
Ketones, ur: NEGATIVE mg/dL
Leukocytes,Ua: NEGATIVE
Nitrite: NEGATIVE
Protein, ur: NEGATIVE mg/dL
Specific Gravity, Urine: 1.012 (ref 1.005–1.030)
pH: 7 (ref 5.0–8.0)

## 2019-12-10 LAB — COMPREHENSIVE METABOLIC PANEL
ALT: 20 U/L (ref 0–44)
AST: 56 U/L — ABNORMAL HIGH (ref 15–41)
Albumin: 3.4 g/dL — ABNORMAL LOW (ref 3.5–5.0)
Alkaline Phosphatase: 101 U/L (ref 38–126)
Anion gap: 15 (ref 5–15)
BUN: <5 mg/dL — ABNORMAL LOW (ref 6–20)
CO2: 22 mmol/L (ref 22–32)
Calcium: 8.7 mg/dL — ABNORMAL LOW (ref 8.9–10.3)
Chloride: 99 mmol/L (ref 98–111)
Creatinine, Ser: 0.72 mg/dL (ref 0.61–1.24)
GFR calc Af Amer: >60 mL/min (ref >60)
GFR calc non Af Amer: >60 mL/min (ref >60)
Glucose, Bld: 117 mg/dL — ABNORMAL HIGH (ref 70–99)
Potassium: 2.9 mmol/L — ABNORMAL LOW (ref 3.5–5.1)
Sodium: 136 mmol/L (ref 135–145)
Total Bilirubin: 1.9 mg/dL — ABNORMAL HIGH (ref 0.3–1.2)
Total Protein: 8 g/dL (ref 6.5–8.1)

## 2019-12-10 LAB — LIPASE, BLOOD: Lipase: 42 U/L (ref 11–51)

## 2019-12-10 LAB — CBC
HCT: 42.4 % (ref 39.0–52.0)
Hemoglobin: 14.1 g/dL (ref 13.0–17.0)
MCH: 32.7 pg (ref 26.0–34.0)
MCHC: 33.3 g/dL (ref 30.0–36.0)
MCV: 98.4 fL (ref 80.0–100.0)
Platelets: 53 10*3/uL — ABNORMAL LOW (ref 150–400)
RBC: 4.31 MIL/uL (ref 4.22–5.81)
RDW: 13 % (ref 11.5–15.5)
WBC: 7.4 10*3/uL (ref 4.0–10.5)
nRBC: 0 % (ref 0.0–0.2)

## 2019-12-10 MED ORDER — TETRACAINE HCL 0.5 % OP SOLN
2.0000 [drp] | Freq: Once | OPHTHALMIC | Status: DC
  Filled 2019-12-10: qty 4

## 2019-12-10 MED ORDER — OXYCODONE HCL 5 MG PO TABS
5.0000 mg | ORAL_TABLET | Freq: Once | ORAL | Status: AC
  Administered 2019-12-10: 5 mg via ORAL
  Filled 2019-12-10: qty 1

## 2019-12-10 MED ORDER — FLUORESCEIN SODIUM 1 MG OP STRP
1.0000 | ORAL_STRIP | Freq: Once | OPHTHALMIC | Status: DC
  Filled 2019-12-10: qty 1

## 2019-12-10 NOTE — ED Notes (Signed)
Pt reports also being seen today for right eye injury and pain. Reports possibly being scratched by his dog.

## 2019-12-10 NOTE — ED Provider Notes (Signed)
[facility] EMERGENCY DEPARTMENT Provider Note   CSN: [id number] Arrival date & time: 12/10/19  0013     History Chief Complaint  Patient presents with  . Abdominal Pain  . Eye Pain    Patrick [name] is a 44 y.o. male.  Patient with history of hepatitis currently on treatment, history of alcohol use, no previous eye surgery history of abdominal surgery history- presents for painful red right eye.  Patient states that he thinks he was scratched by a puppy about 2 weeks ago.  He has had irritation to his eye that has progressed.  He has had progressive vision loss and describes that currently it "looks like I'm looking through wax paper".  He does not wear contact lenses.  He has had an aching headache on the right side of his head.  He has had a lot of tearing but no purulent discharge from the eye.  No fevers.  In addition, patient reports generalized abdominal pain for the past 5 days.  He has had intermittent episodes of nausea and vomiting.  Vomiting is nonbilious, nonbloody.  No diarrhea or constipation.  He states that he has been having some black formed stools but also admits to taking Pepto-Bismol.  No lightheadedness or syncope.  No chest pain or shortness of breath.        Past Medical History:  Diagnosis Date  . Eczema   . Hypertension   . Migraine headache     Patient Active Problem List   Diagnosis Date Noted  . Acne 10/10/2019  . Chronic hepatitis C without hepatic coma (HCC) 09/16/2019  . Gastritis 09/16/2019  . Bacteremia due to Staphylococcus 08/29/2019  . Hypokalemia   . Sepsis with acute liver failure without hepatic coma or septic shock (HCC)   . Transaminitis   . Alcohol withdrawal (HCC) 08/28/2019  . Closed compression fracture of L2 lumbar vertebra, initial encounter (HCC) 08/26/2017  . Behavior change due to substance use 02/27/2017  . Alcohol use disorder, severe, dependence (HCC) 02/27/2017    Past Surgical History:  Procedure  Laterality Date  . BIOPSY  09/04/2019   Procedure: BIOPSY;  Surgeon: Jake Bathe, MD;  Location: [facility] ENDOSCOPY;  Service: Cardiovascular;;  . ESOPHAGOGASTRODUODENOSCOPY (EGD) WITH PROPOFOL  09/04/2019   Procedure: ESOPHAGOGASTRODUODENOSCOPY (EGD) WITH PROPOFOL;  Surgeon: Jake Bathe, MD;  Location: [facility] ENDOSCOPY;  Service: Cardiovascular;;  . TEE WITHOUT CARDIOVERSION N/A 09/04/2019   Procedure: TRANSESOPHAGEAL ECHOCARDIOGRAM (TEE);  Surgeon: Jake Bathe, MD;  Location: [facility] ENDOSCOPY;  Service: Cardiovascular;  Laterality: N/A;       Family History  Problem Relation Age of Onset  . Cancer Mother     Social History   Tobacco Use  . Smoking status: Current Every Day Smoker    Packs/day: 1.00    Types: Cigarettes  . Smokeless tobacco: Never Used  Vaping Use  . Vaping Use: Never used  Substance Use Topics  . Alcohol use: Yes    Comment: every day depends on day of week as to how much  . Drug use: Yes    Types: Marijuana    Comment: every day    Home Medications Prior to Admission medications   Medication Sig Start Date End Date Taking? Authorizing Provider  acetaminophen (TYLENOL) 325 MG tablet Take 3 tablets (975 mg total) by mouth every 6 (six) hours as needed for mild pain. Patient not taking: Reported on 04/10/2018 08/27/17   Barnetta Chapel, PA-C  gabapentin (NEURONTIN) 300 MG capsule  Take 1 capsule (300 mg total) by mouth 3 (three) times daily. 09/17/19   Anders SimmondsMcClung, Angela M, PA-C  Glecaprevir-Pibrentasvir (MAVYRET) 100-40 MG TABS Take 3 tablets by mouth daily with breakfast. 12/01/19   Kuppelweiser, Cassie L, RPH-CPP  methocarbamol (ROBAXIN) 500 MG tablet Take 1 tablet (500 mg total) by mouth every 8 (eight) hours as needed for muscle spasms. Patient not taking: Reported on 04/10/2018 08/27/17   Barnetta Chapelsborne, Kelly, PA-C  Multiple Vitamin (MULTIVITAMIN PO) Take 1 tablet by mouth daily as needed (nutrition).    [provider]  nicotine (NICODERM CQ - DOSED IN MG/24 HOURS) 21  mg/24hr patch Place 1 patch (21 mg total) onto the skin daily. Patient not taking: Reported on 09/16/2019 09/05/19   Rhetta MuraSamtani, Jai-Gurmukh, MD  omeprazole (PRILOSEC) 20 MG capsule Take 1 capsule (20 mg total) by mouth daily. 09/17/19   Anders SimmondsMcClung, Angela M, PA-C  potassium chloride SA (KLOR-CON) 20 MEQ tablet Take 1 tablet (20 mEq total) by mouth daily. 09/18/19   Anders SimmondsMcClung, Angela M, PA-C    Allergies    Mushroom extract complex  Review of Systems   Review of Systems  Constitutional: Negative for fever.  HENT: Negative for rhinorrhea and sore throat.   Eyes: Positive for photophobia, pain, discharge, redness and visual disturbance. Negative for itching.  Respiratory: Negative for cough.   Cardiovascular: Negative for chest pain.  Gastrointestinal: Positive for abdominal pain, nausea and vomiting. Negative for diarrhea.  Genitourinary: Negative for dysuria and hematuria.  Musculoskeletal: Negative for myalgias.  Skin: Negative for rash.  Neurological: Negative for headaches.    Physical Exam Updated Vital Signs BP (!) 180/117 (BP Location: Right Arm)   Pulse 92   Temp 98.6 F (37 C) (Oral)   Resp 20   SpO2 100%   Physical Exam Vitals and nursing note reviewed.  Constitutional:      Appearance: He is well-developed.  HENT:     Head: Normocephalic and atraumatic.  Eyes:     General: Lids are normal.        Right eye: Discharge (tearing) present.        Left eye: No discharge.     Extraocular Movements: Extraocular movements intact.     Right eye: Normal extraocular motion.     Left eye: Normal extraocular motion.     Conjunctiva/sclera:     Right eye: Right conjunctiva is injected. No chemosis or exudate.    Left eye: Left conjunctiva is not injected. No chemosis or exudate.    Pupils:     Right eye: Pupil is sluggish. Pupil is round and reactive. Fluorescein uptake present.     Left eye: Pupil is round, reactive and not sluggish.     Slit lamp exam:    Right eye: Corneal  flare, corneal ulcer and hypopyon present. No hyphema.  Cardiovascular:     Rate and Rhythm: Normal rate and regular rhythm.     Heart sounds: Normal heart sounds.  Pulmonary:     Effort: Pulmonary effort is normal.     Breath sounds: Normal breath sounds.  Abdominal:     Palpations: Abdomen is soft.     Tenderness: There is generalized abdominal tenderness (mild, no focality).  Musculoskeletal:     Cervical back: Normal range of motion and neck supple.  Skin:    General: Skin is warm and dry.  Neurological:     Mental Status: He is alert.            ED Results /  Procedures / Treatments   Labs (all labs ordered are listed, but only abnormal results are displayed) Labs Reviewed  COMPREHENSIVE METABOLIC PANEL - Abnormal; Notable for the following components:      Result Value   Potassium 2.9 (*)    Glucose, Bld 117 (*)    BUN <5 (*)    Calcium 8.7 (*)    Albumin 3.4 (*)    AST 56 (*)    Total Bilirubin 1.9 (*)    All other components within normal limits  CBC - Abnormal; Notable for the following components:   Platelets 53 (*)    All other components within normal limits  LIPASE, BLOOD  URINALYSIS, ROUTINE W REFLEX MICROSCOPIC    EKG None  Radiology No results found.  Procedures Procedures (including critical care time)  Medications Ordered in ED Medications  fluorescein ophthalmic strip 1 strip (has no administration in time range)  tetracaine (PONTOCAINE) 0.5 % ophthalmic solution 2 drop (has no administration in time range)  oxyCODONE (Oxy IR/ROXICODONE) immediate release tablet 5 mg (5 mg Oral Given 12/10/19 1622)    ED Course  I have reviewed the triage vital signs and the nursing notes.  Pertinent labs & imaging results that were available during my care of the patient were reviewed by me and considered in my medical decision making (see chart for details).  Patient seen and examined in hallway bed to best of my ability. Slit lamp exam performed.  Discussed with Dr. Lockie Mola.   Vital signs reviewed and are as follows: BP (!) 180/117 (BP Location: Right Arm)   Pulse 92   Temp 98.6 F (37 C) (Oral)   Resp 20   SpO2 100%   Red eye: Corneal ulcer, hypopyon, concern for endophthalmitis. Exam performed given limitations. Will discuss with on-call ophthalmologist.   Abdominal pain: non-focal with reassuring work-up, normal WBC, normal h&h. Doubt significant GI bleed.  AST elevation likely related to chronic hep C, chronic EtOH use.    4:32 PM I spoke with Dr. Dione Booze by telephone.  Given concern for the eye, he would like to see him in his office immediately where he can obtain an appropriate exam.  Discussed that if patient has a severe eye infection, the possibility exists that he would need to go somewhere such as [facility].  I went and discussed with the patient this plan.  He has transportation issues.  He relies on Lyft or [employer] to get around.  I discussed case with Dr. Dione Booze again.  Discussed that the best place for him to get an appropriate exam would be his office and I am in agreement.  We discussed the patient's transportation issues.  I have spoken with social work who will arrange for transportation directly to Dr. Laruth Bouchard office.  Patient updated. He is willing to go to the ophthalmologist office directly to be seen.  Impressed upon him the importance of having this exam performed immediately.  He is aware he may require specialty care.  In regards to abdominal symptoms, encourage patient PCP follow-up for lab recheck.  The patient was urged to return to the Emergency Department immediately with worsening of current symptoms, worsening abdominal pain, persistent vomiting, blood noted in stools, fever, or any other concerns. The patient verbalized understanding.      MDM Rules/Calculators/A&P                          Right eye pain: Patient with ophthalmologic emergency.  He likely sustained an abrasion which has developed  into an ulcer and now a more widespread infection of the eye.  This infection may threaten his vision.  Patient is aware.  I have been in touch with ophthalmology who would like to see him in office immediately to have a complete exam performed in order to determine best course of action.  Social work involved in obtaining transportation for patient to get to the office.    Abdominal pain: Patient with abdominal pain, nonfocal and generalized. Vitals are stable, no fever. Labs with low platelets, potassium. Imaging not felt indicated right now. No signs of dehydration, patient is tolerating PO's. Lungs are clear and no signs suggestive of PNA. Low concern for appendicitis, cholecystitis, pancreatitis, ruptured viscus, UTI, kidney stone, aortic dissection, aortic aneurysm or other emergent abdominal etiology.  I do not suspect a large-scale GI bleed given lack of tachycardia.  Patient does not have clinical findings of anemia.  Encouraged outpatient follow-up.  Patient's most emergent issue right now is his eye.  Supportive therapy indicated with return if symptoms worsen.     Final Clinical Impression(s) / ED Diagnoses Final diagnoses:  Ulcer of right cornea  Hypopyon of right eye  Thrombocytopenia (HCC)  Hypokalemia  Generalized abdominal pain    Rx / DC Orders ED Discharge Orders    None       Renne Crigler, PA-C 12/10/19 1637    Virgina Norfolk, DO 12/10/19 1909

## 2019-12-10 NOTE — ED Triage Notes (Signed)
Patient from home with abdominal pain that started 5 days ago.  Patient states that he has had dark, tarry stools for the last few days.

## 2019-12-10 NOTE — TOC Transition Note (Signed)
Transition of Care [facility]) - CM/SW Discharge Note   Patient Details  Name: Patrick [name] MRN: [id number] Date of Birth: 08/30/1975  [facility] CM/SW Contact:  Lockie Pares, RN Phone Number: 12/10/2019, 4:43 PM   Clinical Narrative:     [place] Bailiff provided transportation for patient to get to opthamologist stat.         Patient Goals and CMS Choice        Discharge Placement                       Discharge Plan and Services                                     Social Determinants of Health (SDOH) Interventions     Readmission Risk Interventions No flowsheet data found.

## 2019-12-10 NOTE — Discharge Instructions (Addendum)
Go directly to Dr. Laruth Bouchard office.  Your eye needs to be evaluated immediately.  This is a situation that may threaten your vision and the most pressing of your current problems.  In regards to your abdominal pain, your white blood cell count and red blood cell count were normal today.  Your potassium was low, your platelets were low.  This should be rechecked by your primary care doctor in the next few days.  Return the emergency department with worsening abdominal pain, persistent vomiting, blood being passed in your stool, lightheadedness, shortness of breath or if you pass out.  Avoid alcohol and Tylenol.

## 2019-12-15 ENCOUNTER — Telehealth: Payer: Self-pay | Admitting: General Practice

## 2019-12-15 NOTE — Telephone Encounter (Signed)
Copied from CRM [id number]. Topic: General - Other >> Dec 15, 2019 10:06 AM Leafy Ro wrote: reason for CRM: pt is calling and would like social worker to call him back. Pt said its not urgent. Pt did not elaborate

## 2019-12-22 ENCOUNTER — Ambulatory Visit: Payer: Self-pay | Admitting: Pharmacist

## 2019-12-23 NOTE — Telephone Encounter (Signed)
Call placed to patient. Patient shared that he would like an appointment to establish care, states difficulty obtaining a new patient appointment. Pt reports transportation barriers; therefore, would prefer appointment to be tele-health. Please advise

## 2019-12-24 ENCOUNTER — Ambulatory Visit: Payer: Self-pay | Admitting: *Deleted

## 2019-12-24 NOTE — Telephone Encounter (Signed)
Returned call to patient and appt has been scheduled for 01/15/20.

## 2019-12-24 NOTE — Telephone Encounter (Signed)
Patient requesting call back regarding.   

## 2019-12-24 NOTE — Telephone Encounter (Signed)
Called patient and LVM to return call and schedule an appt to establish care.

## 2019-12-24 NOTE — Telephone Encounter (Signed)
Returned call to patient and scheduled him an appt for 01/15/20. Patient was advised to go to urgent care for his symptoms.

## 2019-12-24 NOTE — Telephone Encounter (Signed)
Patient called with complaints of abdmoinal pain that occurs in the center of the abdomen by the sternum and radiates to his shoulder, back and hips. Patient states pain has been occurring for the past 3 weeks.Patient states it feels like a squeezing sensation that is constant and currently rates pain at 7. Patient states he feels like something is stuck in his throat and when he tries to eat if feels like the food is not going down.  Patient has a hx of pancreatitis and patient states he feels like he did back at the end of April, beginning of May. Patient states he did vomit several times on yesterday but none today. Patient reports fever and diarrhea/constipation since having abdominal pain started. Patient states he was able to keep an ensure down today and has been trying to drink fluids like unsweetened tea and has tried to eat crackers. Patient was last seen in th ED on 12/10/19 for current symptoms. Patient does not want to return to the ED. Patient asking if appt could be scheduled on 12/29/19 due to having another appt in close proximity to the office. Patient states he has to arrange for transportation due to having and eye infection and not being able to see well enough to drive. Patient states that a cab or taking the bus is not an option for him. Attempted to call [facility] x2 but no answer at this time. Patient advised to seek treatment in the ED for current symptoms.Understanding verbalized.  Reason for Disposition . [1] MILD-MODERATE pain AND [2] constant AND [3] present > 2 hours  Answer Assessment - Initial Assessment Questions 1. LOCATION: "Where does it hurt?"      Middle of abdomen, the bottom of rib cage 2. RADIATION: "Does the pain shoot anywhere else?" (e.g., chest, back)     Left shoulder, back, hips and feet are starting to swell 3. ONSET: "When did the pain begin?" (Minutes, hours or days ago)      3 weeks ago 4. SUDDEN: "Gradual or sudden onset?"     gradual 5. PATTERN "Does the pain  come and go, or is it constant?"    - If constant: "Is it getting better, staying the same, or worsening?"      (Note: Constant means the pain never goes away completely; most serious pain is constant and it progresses)     - If intermittent: "How long does it last?" "Do you have pain now?"     (Note: Intermittent means the pain goes away completely between bouts)     constant 6. SEVERITY: "How bad is the pain?"  (e.g., Scale 1-10; mild, moderate, or severe)    - MILD (1-3): doesn't interfere with normal activities, abdomen soft and not tender to touch     - MODERATE (4-7): interferes with normal activities or awakens from sleep, tender to touch     - SEVERE (8-10): excruciating pain, doubled over, unable to do any normal activities       7 7. RECURRENT SYMPTOM: "Have you ever had this type of stomach pain before?" If Yes, ask: "When was the last time?" and "What happened that time?"  8. CAUSE: "What do you think is causing the stomach pain?"     Yes hx of pancreatitis, occurred at the end of April, beginning of May 9. RELIEVING/AGGRAVATING FACTORS: "What makes it better or worse?" (e.g., movement, antacids, bowel movement)     Has taken ibuprofen and tylenol but symptoms do not improve 10. OTHER  SYMPTOMS: "Has there been any vomiting, diarrhea, constipation, or urine problems?"       Vomiting several times on yesterday but not today, diarrhea and constipation intermittently since abdominal pain started  Protocols used: ABDOMINAL PAIN - MALE-A-AH

## 2019-12-29 ENCOUNTER — Ambulatory Visit (INDEPENDENT_AMBULATORY_CARE_PROVIDER_SITE_OTHER): Payer: Self-pay | Admitting: Pharmacist

## 2019-12-29 ENCOUNTER — Other Ambulatory Visit: Payer: Self-pay

## 2019-12-29 DIAGNOSIS — B182 Chronic viral hepatitis C: Secondary | ICD-10-CM

## 2019-12-29 NOTE — Progress Notes (Signed)
HPI: Patrick [name] is a 44 y.o. male who presents to the [facility] pharmacy clinic for Hepatitis C follow-up.  Medication: Mavyret x 8 weeks  Start Date: 11/27/19  Hepatitis C Genotype: 1b  Fibrosis Score: F3   Hepatitis C RNA: 1.4 million on 08/31/19  Patient Active Problem List   Diagnosis Date Noted  . Acne 10/10/2019  . Chronic hepatitis C without hepatic coma (HCC) 09/16/2019  . Gastritis 09/16/2019  . Bacteremia due to Staphylococcus 08/29/2019  . Hypokalemia   . Sepsis with acute liver failure without hepatic coma or septic shock (HCC)   . Transaminitis   . Alcohol withdrawal (HCC) 08/28/2019  . Closed compression fracture of L2 lumbar vertebra, initial encounter (HCC) 08/26/2017  . Behavior change due to substance use 02/27/2017  . Alcohol use disorder, severe, dependence (HCC) 02/27/2017    Patient's Medications  New Prescriptions   No medications on file  Previous Medications   GABAPENTIN (NEURONTIN) 300 MG CAPSULE    Take 1 capsule (300 mg total) by mouth 3 (three) times daily.   GLECAPREVIR-PIBRENTASVIR (MAVYRET) 100-40 MG TABS    Take 3 tablets by mouth daily with breakfast.   IBUPROFEN (ADVIL) 200 MG TABLET    Take 600-800 mg by mouth every 6 (six) hours as needed (for pain).   METHOCARBAMOL (ROBAXIN) 500 MG TABLET    Take 1 tablet (500 mg total) by mouth every 8 (eight) hours as needed for muscle spasms.   MULTIPLE VITAMIN (MULTIVITAMIN PO)    Take 1 tablet by mouth daily as needed ("for nutritional purposes").    OMEPRAZOLE (PRILOSEC) 20 MG CAPSULE    Take 1 capsule (20 mg total) by mouth daily.   POTASSIUM CHLORIDE SA (KLOR-CON) 20 MEQ TABLET    Take 1 tablet (20 mEq total) by mouth daily.  Modified Medications   No medications on file  Discontinued Medications   No medications on file    Allergies: Allergies  Allergen Reactions  . Mushroom Extract Complex Diarrhea, Itching and Other (See Comments)    Itchy throat, also    Past Medical History: Past  Medical History:  Diagnosis Date  . Eczema   . Hypertension   . Migraine headache     Social History: Social History   Socioeconomic History  . Marital status: Single    Spouse name: Not on file  . Number of children: Not on file  . Years of education: Not on file  . Highest education level: Not on file  Occupational History  . Not on file  Tobacco Use  . Smoking status: Current Every Day Smoker    Packs/day: 1.00    Types: Cigarettes  . Smokeless tobacco: Never Used  Vaping Use  . Vaping Use: Never used  Substance and Sexual Activity  . Alcohol use: Yes    Comment: every day depends on day of week as to how much  . Drug use: Yes    Types: Marijuana    Comment: every day  . Sexual activity: Not on file  Other Topics Concern  . Not on file  Social History Narrative  . Not on file   Social Determinants of Health   Financial Resource Strain:   . Difficulty of Paying Living Expenses: Not on file  Food Insecurity:   . Worried About Programme researcher, broadcasting/film/video in the Last Year: Not on file  . Ran Out of Food in the Last Year: Not on file  Transportation Needs:   . Lack of  Transportation (Medical): Not on file  . Lack of Transportation (Non-Medical): Not on file  Physical Activity:   . Days of Exercise per Week: Not on file  . Minutes of Exercise per Session: Not on file  Stress:   . Feeling of Stress : Not on file  Social Connections:   . Frequency of Communication with Friends and Family: Not on file  . Frequency of Social Gatherings with Friends and Family: Not on file  . Attends Religious Services: Not on file  . Active Member of Clubs or Organizations: Not on file  . Attends Banker Meetings: Not on file  . Marital Status: Not on file    Labs: Hepatitis C Lab Results  Component Value Date   HCVGENOTYPE 1b 09/02/2019   FIBROSTAGE F3 09/16/2019   Hepatitis B Lab Results  Component Value Date   HEPBSAG NON REACTIVE 08/29/2019   Hepatitis A No  results found for: HAV HIV Lab Results  Component Value Date   HIV Non Reactive 08/29/2019   HIV Non Reactive 08/26/2017   Lab Results  Component Value Date   CREATININE 0.72 12/10/2019   CREATININE 1.33 (H) 09/17/2019   CREATININE 1.12 09/16/2019   CREATININE 0.93 09/12/2019   CREATININE 0.68 09/05/2019   Lab Results  Component Value Date   AST 56 (H) 12/10/2019   AST 57 (H) 09/17/2019   AST 52 (H) 09/16/2019   ALT 20 12/10/2019   ALT 21 09/17/2019   ALT 23 09/16/2019   ALT 21 09/16/2019   INR 1.3 (H) 09/03/2019   INR 1.4 (H) 08/29/2019   INR 1.3 (H) 08/28/2019    Assessment: Patrick [name] is here today for his 44-week follow up appointment for his chronic Hepatitis C infection. He started 8 weeks of Mavyret about a month ago and has three days left in his first month of treatment. He is experiencing some fatigue and headaches but states that he has so much other stuff going on that he is unsure if it is related to Mavyret.  He has thrown up a few times after taking a dose and has also missed about 4-5 days due to not feeling well. I advised him of the issues with missing this many doses and encouraged him not to miss any going forward.  Handed him his 2nd and final month today.  Will check labs, have him come back in 4 weeks for end of treatment lab work, and then have him see Dr. Ninetta Lights for his cure visit after the beginning of the year. Of note, he states he may move to another part of [place] by then. I asked him to call and cancel his appointment if he is no longer living in [place].  Plan: - Hep C viral load and CMET today - Continue Mavyret for 8 weeks - F/u for end of treatment labs 10/5 at 3pm - F/u with Dr. Ninetta Lights 1/11 at 3pm  Oswald Pott L. Cindra Austad, PharmD, BCIDP, AAHIVP, CPP Clinical Pharmacist Practitioner Infectious Diseases Clinical Pharmacist [facility] 12/29/2019, 3:12 PM

## 2019-12-30 ENCOUNTER — Telehealth: Payer: Self-pay | Admitting: Pharmacist

## 2019-12-30 NOTE — Telephone Encounter (Signed)
Patient's potassium is low at 2.6. Called patient to discuss. He has potassium tablets 20 mEQ prescribed on his medication list. No answer, message stated that call could not be completed at this time. Called again and got the same message.

## 2019-12-31 ENCOUNTER — Ambulatory Visit: Payer: Self-pay | Admitting: Pharmacist

## 2020-01-04 LAB — HEPATITIS C RNA QUANTITATIVE
HCV RNA, PCR, QN (Log): <1.18 log IU/mL
HCV RNA, PCR, QN: <15 IU/mL

## 2020-01-04 LAB — COMPREHENSIVE METABOLIC PANEL
AG Ratio: 0.9 (calc) — ABNORMAL LOW (ref 1.0–2.5)
ALT: 10 U/L (ref 9–46)
AST: 22 U/L (ref 10–40)
Albumin: 3.2 g/dL — ABNORMAL LOW (ref 3.6–5.1)
Alkaline phosphatase (APISO): 64 U/L (ref 36–130)
BUN: 9 mg/dL (ref 7–25)
CO2: 19 mmol/L — ABNORMAL LOW (ref 20–32)
Calcium: 8.9 mg/dL (ref 8.6–10.3)
Chloride: 108 mmol/L (ref 98–110)
Creat: 1.11 mg/dL (ref 0.60–1.35)
Globulin: 3.7 g/dL (calc) (ref 1.9–3.7)
Glucose, Bld: 89 mg/dL (ref 65–99)
Potassium: 2.6 mmol/L — CL (ref 3.5–5.3)
Sodium: 139 mmol/L (ref 135–146)
Total Bilirubin: 1.4 mg/dL — ABNORMAL HIGH (ref 0.2–1.2)
Total Protein: 6.9 g/dL (ref 6.1–8.1)

## 2020-01-06 ENCOUNTER — Other Ambulatory Visit: Payer: Self-pay | Admitting: Pharmacist

## 2020-01-06 ENCOUNTER — Encounter: Payer: Self-pay | Admitting: Pharmacist

## 2020-01-06 DIAGNOSIS — E876 Hypokalemia: Secondary | ICD-10-CM

## 2020-01-06 MED ORDER — POTASSIUM CHLORIDE CRYS ER 20 MEQ PO TBCR: 20.0000 meq | EXTENDED_RELEASE_TABLET | Freq: Every day | ORAL | 0 refills | Status: DC

## 2020-01-06 MED FILL — POTASSIUM CL ER 20 MEQ TAB: 20 | 30 days supply | Qty: 30 | Fill #0

## 2020-01-06 NOTE — Telephone Encounter (Signed)
Called patient again. No answer, number not working.

## 2020-01-08 NOTE — Telephone Encounter (Signed)
Attempted to call pt x 2, no response, for low potassium.

## 2020-01-14 NOTE — Progress Notes (Signed)
Patient ID: Patrick [name], male   DOB: Sep 23, 1975, 44 y.o.   MRN: [id number]     Alfio Loescher, is a 44 y.o. male  DJM:[id number]  QIW:[id number]  DOB - 1975/12/28  Subjective:  Chief Complaint and HPI: Patrick [name] is a 44 y.o. male here today to establish care and for a follow up visit After ED visit 12/10/2019.  R eye still with redness and some discomfort.  Saw Dr Dione Booze that day after ED visit as was directed but has not gone back for follow-up.  Vision is a little blurred in the R eye.    He denies abdominal pain.  Still on treatment for Hep C and follows with ID.  Says he drinks very little alcohol now-"a beer every now and then."  No blood in stools.  No vomiting  Used to be on blood pressure meds but does not know what he took.    From ED note: Red eye: Corneal ulcer, hypopyon, concern for endophthalmitis. Exam performed given limitations. Will discuss with on-call ophthalmologist.   Abdominal pain: non-focal with reassuring work-up, normal WBC, normal h&h. Doubt significant GI bleed.  AST elevation likely related to chronic hep C, chronic EtOH use.    4:32 PM I spoke with Dr. Dione Booze by telephone.  Given concern for the eye, he would like to see him in his office immediately where he can obtain an appropriate exam.  Discussed that if patient has a severe eye infection, the possibility exists that he would need to go somewhere such as [facility].  I went and discussed with the patient this plan.  He has transportation issues.  He relies on Lyft or [employer] to get around.  I discussed case with Dr. Dione Booze again.  Discussed that the best place for him to get an appropriate exam would be his office and I am in agreement.  We discussed the patient's transportation issues.  I have spoken with social work who will arrange for transportation directly to Dr. Laruth Bouchard office.  Patient updated. He is willing to go to the ophthalmologist office directly to be seen.  Impressed upon him the importance  of having this exam performed immediately.  He is aware he may require specialty care.  In regards to abdominal symptoms, encourage patient PCP follow-up for lab recheck.  The patient was urged to return to the Emergency Department immediately with worsening of current symptoms, worsening abdominal pain, persistent vomiting, blood noted in stools, fever, or any other concerns. The patient verbalized understanding.   ED/Hospital notes reviewed.    ROS:   Constitutional:  No f/c, No night sweats, No unexplained weight loss. EENT:  No vision changes, No blurry vision, No hearing changes. No mouth, throat, or ear problems.  Respiratory: No cough, No SOB Cardiac: No CP, no palpitations GI:  No abd pain, No N/V/D. GU: No Urinary s/sx Musculoskeletal: No joint pain Neuro: No headache, no dizziness, no motor weakness.  Skin: No rash Endocrine:  No polydipsia. No polyuria.  Psych: Denies SI/HI  No problems updated.  ALLERGIES: Allergies  Allergen Reactions   Mushroom Extract Complex Diarrhea, Itching and Other (See Comments)    Itchy throat, also    PAST MEDICAL HISTORY: Past Medical History:  Diagnosis Date   Eczema    Hypertension    Migraine headache     MEDICATIONS AT HOME: Prior to Admission medications   Medication Sig Start Date End Date Taking? Authorizing Provider  gabapentin (NEURONTIN) 300 MG capsule Take 1 capsule (300  mg total) by mouth 3 (three) times daily. 01/15/20  Yes Andrian Sabala, Marzella Schlein, PA-C  Glecaprevir-Pibrentasvir (MAVYRET) 100-40 MG TABS Take 3 tablets by mouth daily with breakfast. Patient taking differently: Take 3 tablets by mouth daily after supper.  12/01/19  Yes Kuppelweiser, Cassie L, RPH-CPP  ibuprofen (ADVIL) 200 MG tablet Take 600-800 mg by mouth every 6 (six) hours as needed (for pain).   Yes [provider]  Multiple Vitamin (MULTIVITAMIN PO) Take 1 tablet by mouth daily as needed ("for nutritional purposes").    Yes [provider]  omeprazole (PRILOSEC) 20 MG capsule Take 1 capsule (20 mg total) by mouth daily. 09/17/19  Yes Georgian Co M, PA-C  potassium chloride SA (KLOR-CON) 20 MEQ tablet Take 1 tablet (20 mEq total) by mouth daily. 01/15/20  Yes Georgian Co M, PA-C  amLODipine (NORVASC) 10 MG tablet Take 1 tablet (10 mg total) by mouth daily. 01/15/20   Anders Simmonds, PA-C  methocarbamol (ROBAXIN) 500 MG tablet Take 1 tablet (500 mg total) by mouth every 8 (eight) hours as needed for muscle spasms. Patient not taking: Reported on 01/15/2020 08/27/17   Barnetta Chapel, PA-C     Objective:  EXAM:   Vitals:   01/15/20 1523 01/15/20 1526  BP:  (!) 163/122  Pulse:  81  SpO2:  100%  Weight: 167 lb 12.8 oz (76.1 kg)   Height: 5\' 11"  (1.803 m)     General appearance : A&OX3. NAD. Non-toxic-appearing HEENT: Atraumatic and Normocephalic.  PERRLA. EOM intact.  R sclera appears red and irritated.   Neck: supple, no JVD. No cervical lymphadenopathy. No thyromegaly Chest/Lungs:  Breathing-non-labored, Good air entry bilaterally, breath sounds normal without rales, rhonchi, or wheezing  CVS: S1 S2 regular, no murmurs, gallops, rubs  Extremities: Bilateral Lower Ext shows no edema, both legs are warm to touch with = pulse throughout Neurology:  CN II-XII grossly intact, Non focal.   Psych:  TP linear. J/I WNL. Normal speech. Appropriate eye contact and affect.  Skin:  No Rash  Data Review No results found for: HGBA1C   Assessment & Plan   1. Hypokalemia -avoid alcohol - Comprehensive metabolic panel - potassium chloride SA (KLOR-CON) 20 MEQ tablet; Take 1 tablet (20 mEq total) by mouth daily.  Dispense: 30 tablet; Refill: 2  2. Transaminitis Avoid alcohol - Comprehensive metabolic panel  3. Alcohol use disorder, severe, dependence (HCC) I have counseled the patient at length about substance abuse and addiction.  12 step meetings/recovery recommended.  Local 12 step meeting lists were  given and attendance was encouraged.  Patient expresses understanding.    4. Chronic hepatitis C without hepatic coma (HCC) Continue f/up with ID  5. Hospital discharge follow-up Needs to f/up with [facility]-referral placed  6. Thrombocytopenia (HCC) See #3 - CBC with Differential/Platelet  7. Hypomagnesemia See #3 - Magnesium  8. Hypertension, unspecified type Take 1/2 daily for 5-7 days then 1 daily.  Check BP OOO 3 times weekly and record(was apprehensive of clonidine in office due to Substance Abuse history) - amLODipine (NORVASC) 10 MG tablet; Take 1 tablet (10 mg total) by mouth daily.  Dispense: 90 tablet; Refill: 3 - Ambulatory referral to Ophthalmology  9. Neuropathy - gabapentin (NEURONTIN) 300 MG capsule; Take 1 capsule (300 mg total) by mouth 3 (three) times daily.  Dispense: 90 capsule; Refill: 3  10. Ulcer of right cornea - Ambulatory referral to Ophthalmology-see asap   Patient have been counseled extensively about nutrition and exercise  Return for 3-4 weeks with Franky Macho for BP;  2 months assign PCP.  The patient was given clear instructions to go to ER or return to medical center if symptoms don't improve, worsen or new problems develop. The patient verbalized understanding. The patient was told to call to get lab results if they haven't heard anything in the next week.     Georgian Co, PA-C [facility] [place], [place] [phone]   01/15/2020, 4:10 PM

## 2020-01-15 ENCOUNTER — Other Ambulatory Visit: Payer: Self-pay

## 2020-01-15 ENCOUNTER — Ambulatory Visit: Payer: Self-pay | Attending: Physician Assistant | Admitting: Physician Assistant

## 2020-01-15 ENCOUNTER — Other Ambulatory Visit: Payer: Self-pay | Admitting: Physician Assistant

## 2020-01-15 ENCOUNTER — Encounter: Payer: Self-pay | Admitting: Physician Assistant

## 2020-01-15 VITALS — BP 163/122 | HR 81 | Ht 71.0 in | Wt 167.8 lb

## 2020-01-15 DIAGNOSIS — D696 Thrombocytopenia, unspecified: Secondary | ICD-10-CM

## 2020-01-15 DIAGNOSIS — H16001 Unspecified corneal ulcer, right eye: Secondary | ICD-10-CM

## 2020-01-15 DIAGNOSIS — I10 Essential (primary) hypertension: Secondary | ICD-10-CM

## 2020-01-15 DIAGNOSIS — R7401 Elevation of levels of liver transaminase levels: Secondary | ICD-10-CM

## 2020-01-15 DIAGNOSIS — F1020 Alcohol dependence, uncomplicated: Secondary | ICD-10-CM

## 2020-01-15 DIAGNOSIS — Z09 Encounter for follow-up examination after completed treatment for conditions other than malignant neoplasm: Secondary | ICD-10-CM

## 2020-01-15 DIAGNOSIS — B182 Chronic viral hepatitis C: Secondary | ICD-10-CM

## 2020-01-15 DIAGNOSIS — G629 Polyneuropathy, unspecified: Secondary | ICD-10-CM

## 2020-01-15 DIAGNOSIS — E876 Hypokalemia: Secondary | ICD-10-CM

## 2020-01-15 MED ORDER — POTASSIUM CHLORIDE CRYS ER 20 MEQ PO TBCR: 20.0000 meq | EXTENDED_RELEASE_TABLET | Freq: Every day | ORAL | 2 refills | Status: DC

## 2020-01-15 MED ORDER — AMLODIPINE BESYLATE 10 MG PO TABS: 10.0000 mg | ORAL_TABLET | Freq: Every day | ORAL | 3 refills | Status: DC

## 2020-01-15 MED ORDER — GABAPENTIN 300 MG PO CAPS: 300.0000 mg | ORAL_CAPSULE | Freq: Three times a day (TID) | ORAL | 3 refills | Status: DC

## 2020-01-15 MED FILL — AMLODIPINE BESYLATE 10 MG T: 10 | 90 days supply | Qty: 90 | Fill #0

## 2020-01-15 MED FILL — POTASSIUM CL ER 20 MEQ TAB: 20 | 30 days supply | Qty: 30 | Fill #0

## 2020-01-15 MED FILL — GABAPENTIN 300 MG CAPSULE: 300 | 30 days supply | Qty: 90 | Fill #0

## 2020-01-15 NOTE — Progress Notes (Signed)
R eye issue x 1 month  Willing to get Tdap today

## 2020-01-15 NOTE — Patient Instructions (Signed)
Take 1/2 amlodipine daily X 1 week then 1 tablet daily.  Check blood pressure 3 times weekly and record and bring to next visit.    [email or web address] is the website for AA

## 2020-01-16 LAB — COMPREHENSIVE METABOLIC PANEL
ALT: 7 IU/L (ref 0–44)
AST: 15 IU/L (ref 0–40)
Albumin/Globulin Ratio: 1.4 (ref 1.2–2.2)
Albumin: 4.1 g/dL (ref 4.0–5.0)
Alkaline Phosphatase: 70 IU/L (ref 44–121)
BUN/Creatinine Ratio: 13 (ref 9–20)
BUN: 13 mg/dL (ref 6–24)
Bilirubin Total: 0.7 mg/dL (ref 0.0–1.2)
CO2: 14 mmol/L — ABNORMAL LOW (ref 20–29)
Calcium: 8.5 mg/dL — ABNORMAL LOW (ref 8.7–10.2)
Chloride: 108 mmol/L — ABNORMAL HIGH (ref 96–106)
Creatinine, Ser: 0.97 mg/dL (ref 0.76–1.27)
GFR calc Af Amer: 109 mL/min/{1.73_m2} (ref >59)
GFR calc non Af Amer: 95 mL/min/{1.73_m2} (ref >59)
Globulin, Total: 3 g/dL (ref 1.5–4.5)
Glucose: 102 mg/dL — ABNORMAL HIGH (ref 65–99)
Potassium: 2.8 mmol/L — ABNORMAL LOW (ref 3.5–5.2)
Sodium: 137 mmol/L (ref 134–144)
Total Protein: 7.1 g/dL (ref 6.0–8.5)

## 2020-01-16 LAB — CBC WITH DIFFERENTIAL/PLATELET
Basophils Absolute: 0.1 10*3/uL (ref 0.0–0.2)
Basos: 1 %
EOS (ABSOLUTE): 0.1 10*3/uL (ref 0.0–0.4)
Eos: 1 %
Hematocrit: 32.1 % — ABNORMAL LOW (ref 37.5–51.0)
Hemoglobin: 11.3 g/dL — ABNORMAL LOW (ref 13.0–17.7)
Immature Grans (Abs): 0 10*3/uL (ref 0.0–0.1)
Immature Granulocytes: 0 %
Lymphocytes Absolute: 1.7 10*3/uL (ref 0.7–3.1)
Lymphs: 22 %
MCH: 35.1 pg — ABNORMAL HIGH (ref 26.6–33.0)
MCHC: 35.2 g/dL (ref 31.5–35.7)
MCV: 100 fL — ABNORMAL HIGH (ref 79–97)
Monocytes Absolute: 0.6 10*3/uL (ref 0.1–0.9)
Monocytes: 7 %
Neutrophils Absolute: 5.3 10*3/uL (ref 1.4–7.0)
Neutrophils: 69 %
Platelets: 119 10*3/uL — ABNORMAL LOW (ref 150–450)
RBC: 3.22 x10E6/uL — ABNORMAL LOW (ref 4.14–5.80)
RDW: 17 % — ABNORMAL HIGH (ref 11.6–15.4)
WBC: 7.8 10*3/uL (ref 3.4–10.8)

## 2020-01-16 LAB — MAGNESIUM: Magnesium: 2.3 mg/dL (ref 1.6–2.3)

## 2020-01-20 ENCOUNTER — Encounter: Payer: Self-pay | Admitting: *Deleted

## 2020-01-28 ENCOUNTER — Encounter: Payer: Self-pay | Admitting: Physician Assistant

## 2020-02-02 ENCOUNTER — Other Ambulatory Visit: Payer: Self-pay

## 2020-02-02 ENCOUNTER — Other Ambulatory Visit: Payer: Self-pay | Admitting: Family Medicine

## 2020-02-02 ENCOUNTER — Other Ambulatory Visit: Payer: Self-pay | Admitting: Pharmacist

## 2020-02-02 DIAGNOSIS — B182 Chronic viral hepatitis C: Secondary | ICD-10-CM

## 2020-02-02 DIAGNOSIS — K0889 Other specified disorders of teeth and supporting structures: Secondary | ICD-10-CM

## 2020-02-02 MED ORDER — AMOXICILLIN 500 MG PO CAPS: 500.0000 mg | ORAL_CAPSULE | Freq: Three times a day (TID) | ORAL | 0 refills | Status: DC

## 2020-02-02 NOTE — Progress Notes (Signed)
Opened in error

## 2020-02-03 ENCOUNTER — Other Ambulatory Visit: Payer: Self-pay

## 2020-02-03 DIAGNOSIS — B182 Chronic viral hepatitis C: Secondary | ICD-10-CM

## 2020-02-05 LAB — HEPATITIS C RNA QUANTITATIVE
HCV RNA, PCR, QN (Log): <1.18 log IU/mL
HCV RNA, PCR, QN: <15 IU/mL

## 2020-02-06 ENCOUNTER — Ambulatory Visit: Payer: Self-pay | Admitting: Pharmacist

## 2020-03-15 MED FILL — POTASSIUM CL ER 20 MEQ TAB: 20 | 30 days supply | Qty: 30 | Fill #1

## 2020-03-15 MED FILL — GABAPENTIN 300 MG CAPSULE: 300 | 30 days supply | Qty: 90 | Fill #1

## 2020-03-17 ENCOUNTER — Ambulatory Visit: Payer: Self-pay | Attending: Physician Assistant | Admitting: Physician Assistant

## 2020-03-17 ENCOUNTER — Other Ambulatory Visit: Payer: Self-pay

## 2020-03-17 ENCOUNTER — Encounter: Payer: Self-pay | Admitting: Physician Assistant

## 2020-03-17 ENCOUNTER — Other Ambulatory Visit: Payer: Self-pay | Admitting: Physician Assistant

## 2020-03-17 VITALS — BP 142/102 | HR 88 | Resp 16 | Ht 71.0 in | Wt 178.0 lb

## 2020-03-17 DIAGNOSIS — I10 Essential (primary) hypertension: Secondary | ICD-10-CM

## 2020-03-17 DIAGNOSIS — G629 Polyneuropathy, unspecified: Secondary | ICD-10-CM

## 2020-03-17 DIAGNOSIS — E876 Hypokalemia: Secondary | ICD-10-CM

## 2020-03-17 DIAGNOSIS — R110 Nausea: Secondary | ICD-10-CM

## 2020-03-17 DIAGNOSIS — R1012 Left upper quadrant pain: Secondary | ICD-10-CM

## 2020-03-17 MED ORDER — POTASSIUM CHLORIDE CRYS ER 20 MEQ PO TBCR: 20.0000 meq | EXTENDED_RELEASE_TABLET | Freq: Every day | ORAL | 2 refills | Status: DC

## 2020-03-17 MED ORDER — LISINOPRIL 10 MG PO TABS: 10.0000 mg | ORAL_TABLET | Freq: Every day | ORAL | 3 refills | Status: DC

## 2020-03-17 MED ORDER — GABAPENTIN 300 MG PO CAPS: ORAL_CAPSULE | ORAL | 3 refills | Status: DC

## 2020-03-17 MED ORDER — AMLODIPINE BESYLATE 10 MG PO TABS: 10.0000 mg | ORAL_TABLET | Freq: Every day | ORAL | 3 refills | Status: DC

## 2020-03-17 MED FILL — LISINOPRIL 10 MG TABS: 10 | 90 days supply | Qty: 90 | Fill #0

## 2020-03-17 NOTE — Progress Notes (Signed)
Patient ID: Patrick [name], male   DOB: 1976-02-24, 44 y.o.   MRN: [id number]   Patrick [name], is a 44 y.o. male  Patrick [name]:[id number]  Patrick [name]:[id number]  DOB - 09/11/1975  Subjective:  Chief Complaint and HPI: Patrick [name] is a 44 y.o. male here for med RF and LUQ pain and uncontrolled neuropathy that is worse in the evenings.  He has completed Hep C treatment and has ID f/up in January.    Check BP daily at home and gets 140-150/90-110.  Compliant with amlodipine.  He has taken lisinopril in the past and tolerated it well.    Many months of LUQ pain.  Occasional N with rare vomiting.  Bowels moving normally.  No fever.  Appetite is good.  Says omeprazole causes watery diarrhea.  Pain is midepigastric and LUQ and radiates into the back at times.  It is chronic and ongoing.  Says he has not drank alcohol since august of this year    ROS:   Constitutional:  No f/c, No night sweats, No unexplained weight loss. EENT:  No vision changes, No blurry vision, No hearing changes. No mouth, throat, or ear problems.  Respiratory: No cough, No SOB Cardiac: No CP, no palpitations GI:  See above GU: No Urinary s/sx Musculoskeletal: neouropathy Neuro: No headache, no dizziness, no motor weakness.  Skin: No rash Endocrine:  No polydipsia. No polyuria.  Psych: Denies SI/HI  No problems updated.  ALLERGIES: Allergies  Allergen Reactions  . Mushroom Extract Complex Diarrhea, Itching and Other (See Comments)    Itchy throat, also    PAST MEDICAL HISTORY: Past Medical History:  Diagnosis Date  . Eczema   . Hypertension   . Migraine headache     MEDICATIONS AT HOME: Prior to Admission medications   Medication Sig Start Date End Date Taking? Authorizing Provider  amLODipine (NORVASC) 10 MG tablet Take 1 tablet (10 mg total) by mouth daily. 03/17/20   Patrick Simmonds, PA-C  amoxicillin (AMOXIL) 500 MG capsule Take 1 capsule (500 mg total) by mouth 3 (three) times daily. Patient not taking: Reported on  03/17/2020 02/02/20   Hoy Register, MD  gabapentin (NEURONTIN) 300 MG capsule 1 tab in the morning and afternoon for neuropathy and 2 tabs at night 03/17/20   Margit Batte M, PA-C  Glecaprevir-Pibrentasvir (MAVYRET) 100-40 MG TABS Take 3 tablets by mouth daily with breakfast. Patient taking differently: Take 3 tablets by mouth daily after supper.  12/01/19   Kuppelweiser, Cassie L, RPH-CPP  ibuprofen (ADVIL) 200 MG tablet Take 600-800 mg by mouth every 6 (six) hours as needed (for pain).    [provider]  lisinopril (ZESTRIL) 10 MG tablet Take 1 tablet (10 mg total) by mouth daily. 03/17/20   Patrick Simmonds, PA-C  methocarbamol (ROBAXIN) 500 MG tablet Take 1 tablet (500 mg total) by mouth every 8 (eight) hours as needed for muscle spasms. Patient not taking: Reported on 01/15/2020 08/27/17   Barnetta Chapel, PA-C  Multiple Vitamin (MULTIVITAMIN PO) Take 1 tablet by mouth daily as needed ("for nutritional purposes").     [provider]  omeprazole (PRILOSEC) 20 MG capsule Take 1 capsule (20 mg total) by mouth daily. 09/17/19   Patrick Simmonds, PA-C  potassium chloride SA (KLOR-CON) 20 MEQ tablet Take 1 tablet (20 mEq total) by mouth daily. 03/17/20   Patrick Simmonds, PA-C     Objective:  EXAM:   Vitals:   03/17/20 1532 03/17/20 1533  BP: (!) 154/111 (!) 142/102  Pulse: 88   Resp: 16   SpO2: 98%   Weight: 178 lb (80.7 kg)   Height: 5\' 11"  (1.803 m)     General appearance : A&OX3. NAD. Non-toxic-appearing HEENT: Atraumatic and Normocephalic.  PERRLA. EOM intact.   Chest/Lungs:  Breathing-non-labored, Good air entry bilaterally, breath sounds normal without rales, rhonchi, or wheezing  CVS: S1 S2 regular, no murmurs, gallops, rubs  Abdomen: Bowel sounds present, Non tender and not distended with no gaurding, rigidity or rebound. Extremities: Bilateral Lower Ext shows no edema, both legs are warm to touch with = pulse throughout Neurology:  CN II-XII grossly  intact, Non focal.   Psych:  TP linear. J/I WNL. Normal speech. Appropriate eye contact and affect.  Skin:  No Rash  Data Review No results found for: HGBA1C   Assessment & Plan   1. Neuropathy Not controlled-will increase bedtime dose gabapentin - gabapentin (NEURONTIN) 300 MG capsule; 1 tab in the morning and afternoon for neuropathy and 2 tabs at night  Dispense: 120 capsule; Refill: 3 - CBC with Differential/Platelet - B12 and Folate Panel - Vitamin D, 25-hydroxy  2. Hypertension, unspecified type Not controlled-continue amlodipine and add Lisinopril.(HCT not an option due to chronic hypokalemia).  Check BP daily and record and bring to next visit - amLODipine (NORVASC) 10 MG tablet; Take 1 tablet (10 mg total) by mouth daily.  Dispense: 90 tablet; Refill: 3 - lisinopril (ZESTRIL) 10 MG tablet; Take 1 tablet (10 mg total) by mouth daily.  Dispense: 90 tablet; Refill: 3 - Comprehensive metabolic panel  3. Hypokalemia - potassium chloride SA (KLOR-CON) 20 MEQ tablet; Take 1 tablet (20 mEq total) by mouth daily.  Dispense: 30 tablet; Refill: 2 - Comprehensive metabolic panel  4. LUQ pain Not acute - H. pylori breath test - Ambulatory referral to Gastroenterology - Comprehensive metabolic panel - Lipase - CBC with Differential/Platelet  5. Nausea - Ambulatory referral to Gastroenterology - Comprehensive metabolic panel - Lipase   Patient have been counseled extensively about nutrition and exercise  Return for 3 weeks with for BP check; assign PCP ina bout 6 weeks.  The patient was given clear instructions to go to ER or return to medical center if symptoms don't improve, worsen or new problems develop. The patient verbalized understanding. The patient was told to call to get lab results if they haven't heard anything in the next week.     Franky Macho, PA-C [facility] [place], [place]   03/17/2020, 4:37 PM

## 2020-03-17 NOTE — Patient Instructions (Signed)
Check blood pressure daily and record.  Have readings available for next telephone appt.

## 2020-03-18 ENCOUNTER — Other Ambulatory Visit: Payer: Self-pay | Admitting: Physician Assistant

## 2020-03-18 LAB — CBC WITH DIFFERENTIAL/PLATELET
Basophils Absolute: 0.1 10*3/uL (ref 0.0–0.2)
Basos: 1 %
EOS (ABSOLUTE): 0.2 10*3/uL (ref 0.0–0.4)
Eos: 2 %
Hematocrit: 41.3 % (ref 37.5–51.0)
Hemoglobin: 13.8 g/dL (ref 13.0–17.7)
Immature Grans (Abs): 0 10*3/uL (ref 0.0–0.1)
Immature Granulocytes: 0 %
Lymphocytes Absolute: 1.9 10*3/uL (ref 0.7–3.1)
Lymphs: 26 %
MCH: 31.8 pg (ref 26.6–33.0)
MCHC: 33.4 g/dL (ref 31.5–35.7)
MCV: 95 fL (ref 79–97)
Monocytes Absolute: 0.5 10*3/uL (ref 0.1–0.9)
Monocytes: 7 %
Neutrophils Absolute: 4.6 10*3/uL (ref 1.4–7.0)
Neutrophils: 64 %
Platelets: 144 10*3/uL — ABNORMAL LOW (ref 150–450)
RBC: 4.34 x10E6/uL (ref 4.14–5.80)
RDW: 12.1 % (ref 11.6–15.4)
WBC: 7.2 10*3/uL (ref 3.4–10.8)

## 2020-03-18 LAB — COMPREHENSIVE METABOLIC PANEL
ALT: 13 IU/L (ref 0–44)
AST: 19 IU/L (ref 0–40)
Albumin/Globulin Ratio: 1.4 (ref 1.2–2.2)
Albumin: 4.3 g/dL (ref 4.0–5.0)
Alkaline Phosphatase: 72 IU/L (ref 44–121)
BUN/Creatinine Ratio: 8 — ABNORMAL LOW (ref 9–20)
BUN: 9 mg/dL (ref 6–24)
Bilirubin Total: 0.5 mg/dL (ref 0.0–1.2)
CO2: 20 mmol/L (ref 20–29)
Calcium: 9.2 mg/dL (ref 8.7–10.2)
Chloride: 106 mmol/L (ref 96–106)
Creatinine, Ser: 1.14 mg/dL (ref 0.76–1.27)
GFR calc Af Amer: 90 mL/min/{1.73_m2} (ref >59)
GFR calc non Af Amer: 78 mL/min/{1.73_m2} (ref >59)
Globulin, Total: 3 g/dL (ref 1.5–4.5)
Glucose: 109 mg/dL — ABNORMAL HIGH (ref 65–99)
Potassium: 3.6 mmol/L (ref 3.5–5.2)
Sodium: 140 mmol/L (ref 134–144)
Total Protein: 7.3 g/dL (ref 6.0–8.5)

## 2020-03-18 LAB — B12 AND FOLATE PANEL
Folate: 4.8 ng/mL (ref >3.0)
Vitamin B-12: 325 pg/mL (ref 232–1245)

## 2020-03-18 LAB — H. PYLORI BREATH TEST: H pylori Breath Test: NEGATIVE

## 2020-03-18 LAB — VITAMIN D 25 HYDROXY (VIT D DEFICIENCY, FRACTURES): Vit D, 25-Hydroxy: 13.7 ng/mL — ABNORMAL LOW (ref 30.0–100.0)

## 2020-03-18 LAB — LIPASE: Lipase: 16 U/L (ref 13–78)

## 2020-03-18 MED ORDER — VITAMIN D (ERGOCALCIFEROL) 1.25 MG (50000 UNIT) PO CAPS: 50000.0000 [IU] | ORAL_CAPSULE | ORAL | 0 refills | Status: DC

## 2020-04-07 ENCOUNTER — Ambulatory Visit: Payer: Self-pay | Admitting: Pharmacist

## 2020-04-09 MED FILL — POTASSIUM CHLORIDE 20meqER: 20 | 90 days supply | Qty: 90 | Fill #0

## 2020-04-09 MED FILL — GABAPENTIN 300 MG CAPSULE: 300 | 90 days supply | Qty: 360 | Fill #0

## 2020-04-09 MED FILL — AMLODIPINE BESYLATE 10 MG T: 10 | 90 days supply | Qty: 90 | Fill #1

## 2020-04-20 ENCOUNTER — Ambulatory Visit: Payer: Self-pay | Admitting: Pharmacist

## 2020-04-28 ENCOUNTER — Ambulatory Visit: Payer: Self-pay | Admitting: Critical Care Medicine

## 2020-05-11 ENCOUNTER — Ambulatory Visit: Payer: Self-pay | Admitting: Infectious Diseases

## 2020-07-11 ENCOUNTER — Encounter: Payer: Self-pay | Admitting: Physician Assistant

## 2020-07-14 ENCOUNTER — Other Ambulatory Visit: Payer: Self-pay | Admitting: Physician Assistant

## 2020-07-14 DIAGNOSIS — K2920 Alcoholic gastritis without bleeding: Secondary | ICD-10-CM

## 2020-07-14 DIAGNOSIS — G629 Polyneuropathy, unspecified: Secondary | ICD-10-CM

## 2020-07-14 DIAGNOSIS — I10 Essential (primary) hypertension: Secondary | ICD-10-CM

## 2020-07-14 DIAGNOSIS — E876 Hypokalemia: Secondary | ICD-10-CM

## 2020-07-14 MED ORDER — POTASSIUM CHLORIDE CRYS ER 20 MEQ PO TBCR: 20.0000 meq | EXTENDED_RELEASE_TABLET | Freq: Every day | ORAL | 2 refills | Status: DC

## 2020-07-14 MED ORDER — GABAPENTIN 300 MG PO CAPS: ORAL_CAPSULE | ORAL | 3 refills | Status: DC

## 2020-07-14 MED ORDER — AMLODIPINE BESYLATE 10 MG PO TABS: 10.0000 mg | ORAL_TABLET | Freq: Every day | ORAL | 3 refills | Status: DC

## 2020-07-14 MED ORDER — OMEPRAZOLE 20 MG PO CPDR: 20.0000 mg | DELAYED_RELEASE_CAPSULE | Freq: Every day | ORAL | 3 refills | Status: DC

## 2020-07-14 MED ORDER — LISINOPRIL 10 MG PO TABS: 10.0000 mg | ORAL_TABLET | Freq: Every day | ORAL | 3 refills | Status: DC

## 2020-08-27 ENCOUNTER — Other Ambulatory Visit: Payer: Self-pay

## 2020-08-27 MED FILL — Gabapentin Cap 300 MG: ORAL | 30 days supply | Qty: 90 | Fill #0 | Status: AC

## 2020-08-27 MED FILL — Potassium Chloride Microencapsulated Crys ER Tab 20 mEq: ORAL | 30 days supply | Qty: 30 | Fill #0 | Status: AC

## 2020-09-02 IMAGING — DX DG HIP (WITH OR WITHOUT PELVIS) 1V*R*
3 series · 3 of 3 positions shown · non-contrast
Comparison: CT 08/28/2019.

CLINICAL DATA: Hip pain.

EXAM:
DG HIP (WITH OR WITHOUT PELVIS) 1V RIGHT

[pelvis ap]
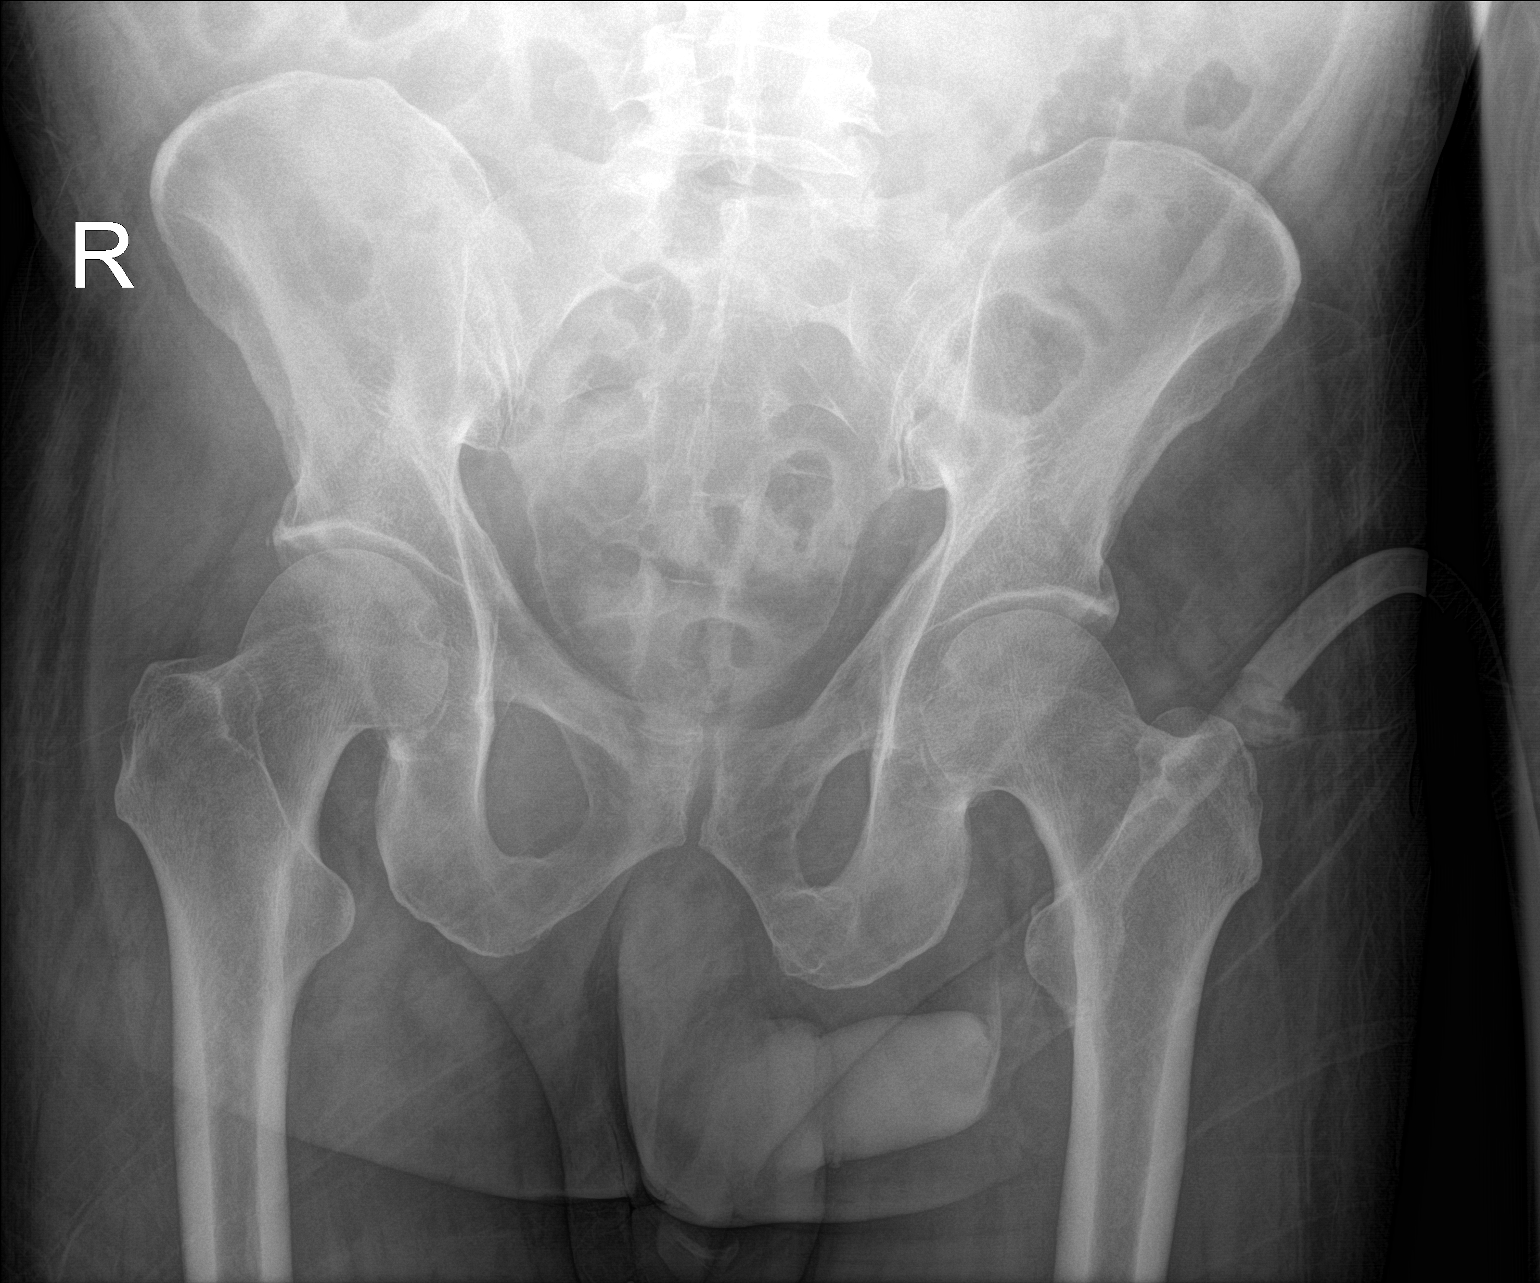

[hip ap]
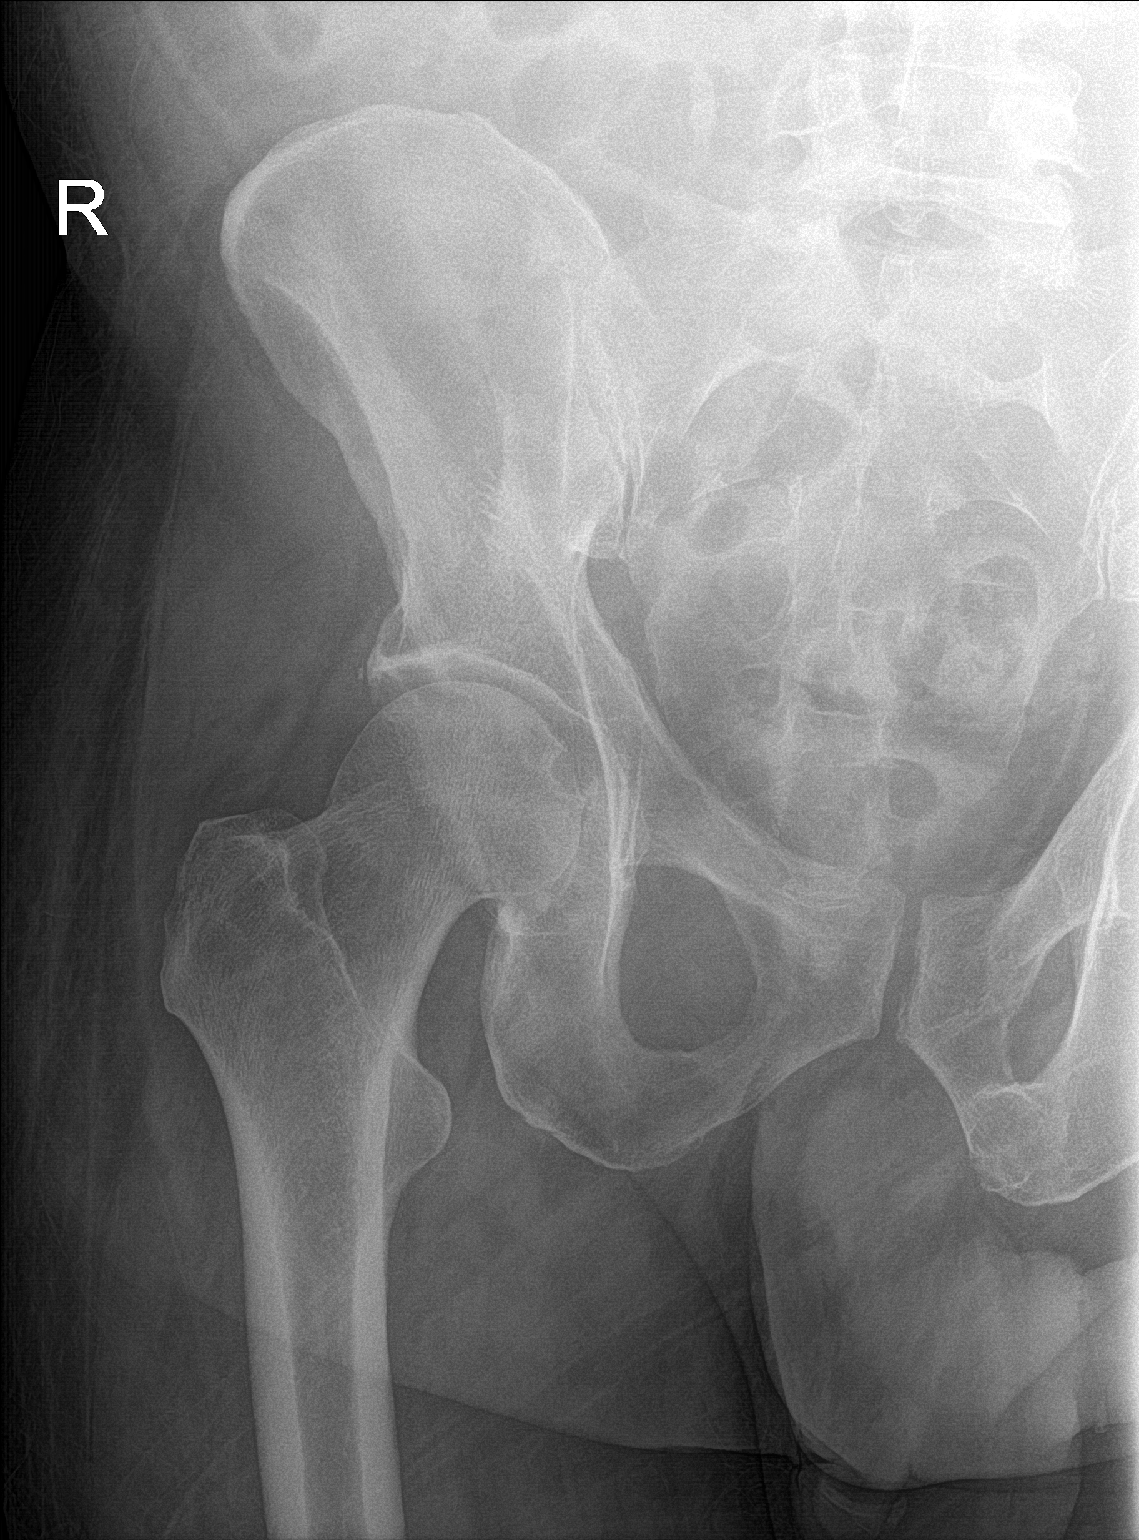

[hip lat]
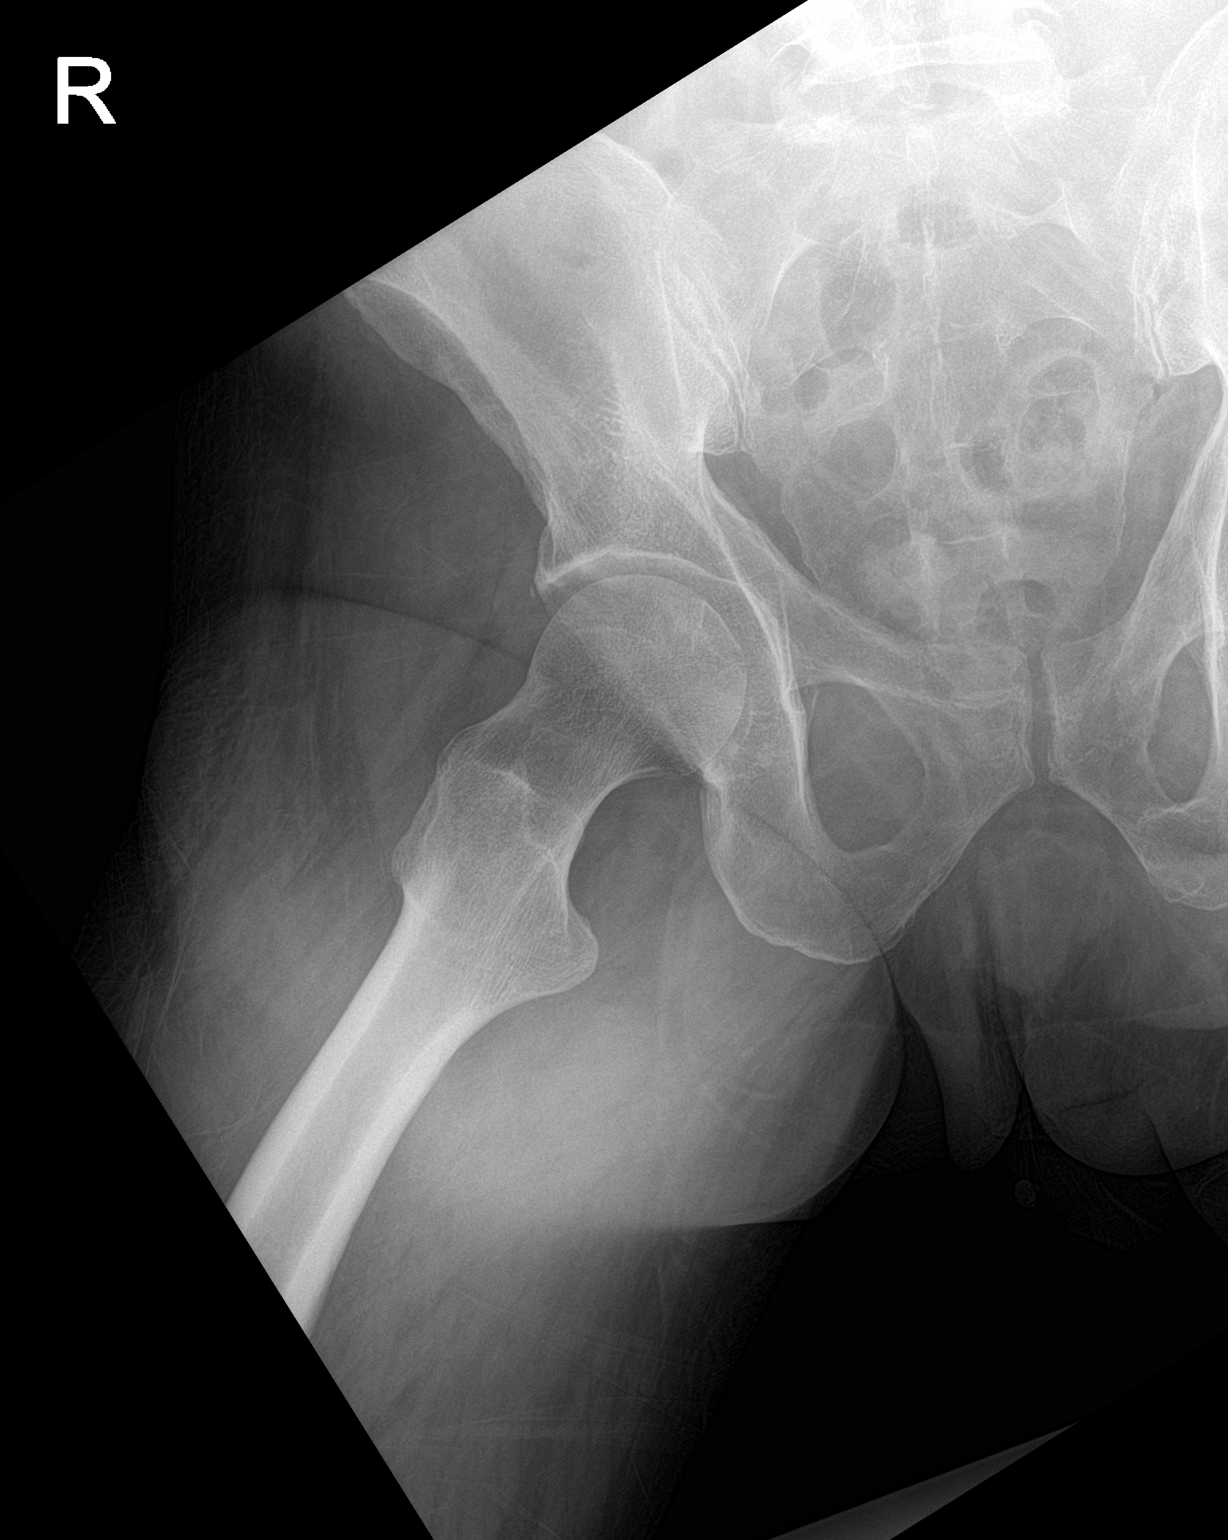

[3 of 3 positions shown; findings below may reference images not displayed]

FINDINGS: No evidence of femoral fracture or dislocation. Miniscule bony
density noted adjacent to the right acetabulum, this may represent a
tiny fracture fragment, age undetermined. Degenerative change could
also present this fashion.
IMPRESSION: No evidence of femoral fracture or dislocation.

2. Miniscule bony density noted adjacent to the right acetabulum,
this may represent a tiny fracture fragment, age undetermined.
Degenerative change could also present this fashion.

## 2020-09-12 IMAGING — CR DG CHEST 2V
2 series · 2 of 2 positions shown · non-contrast
Comparison: 09/02/2019

CLINICAL DATA: Shortness of breath

EXAM:
CHEST - 2 VIEW

[w chest pa]
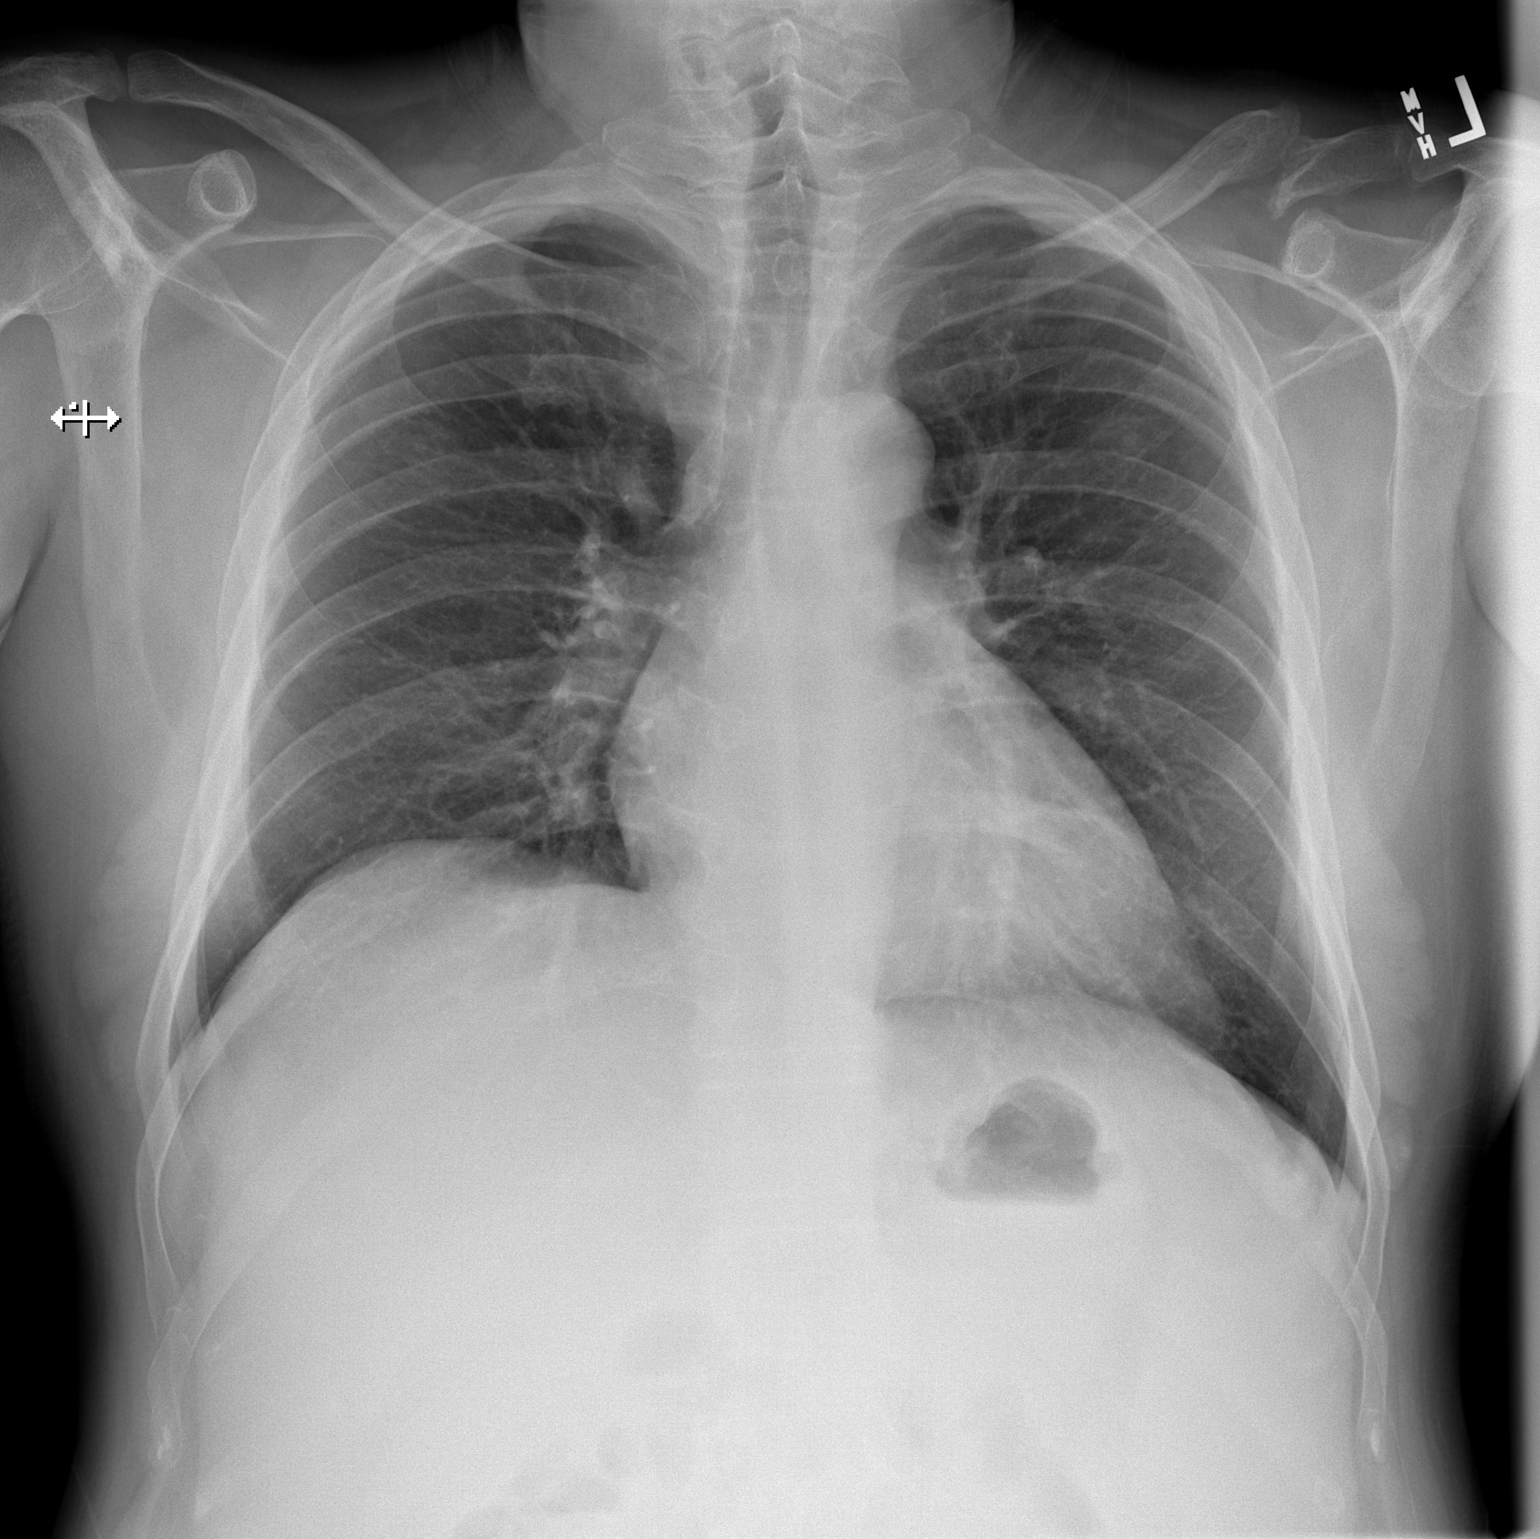

[w chest lat]
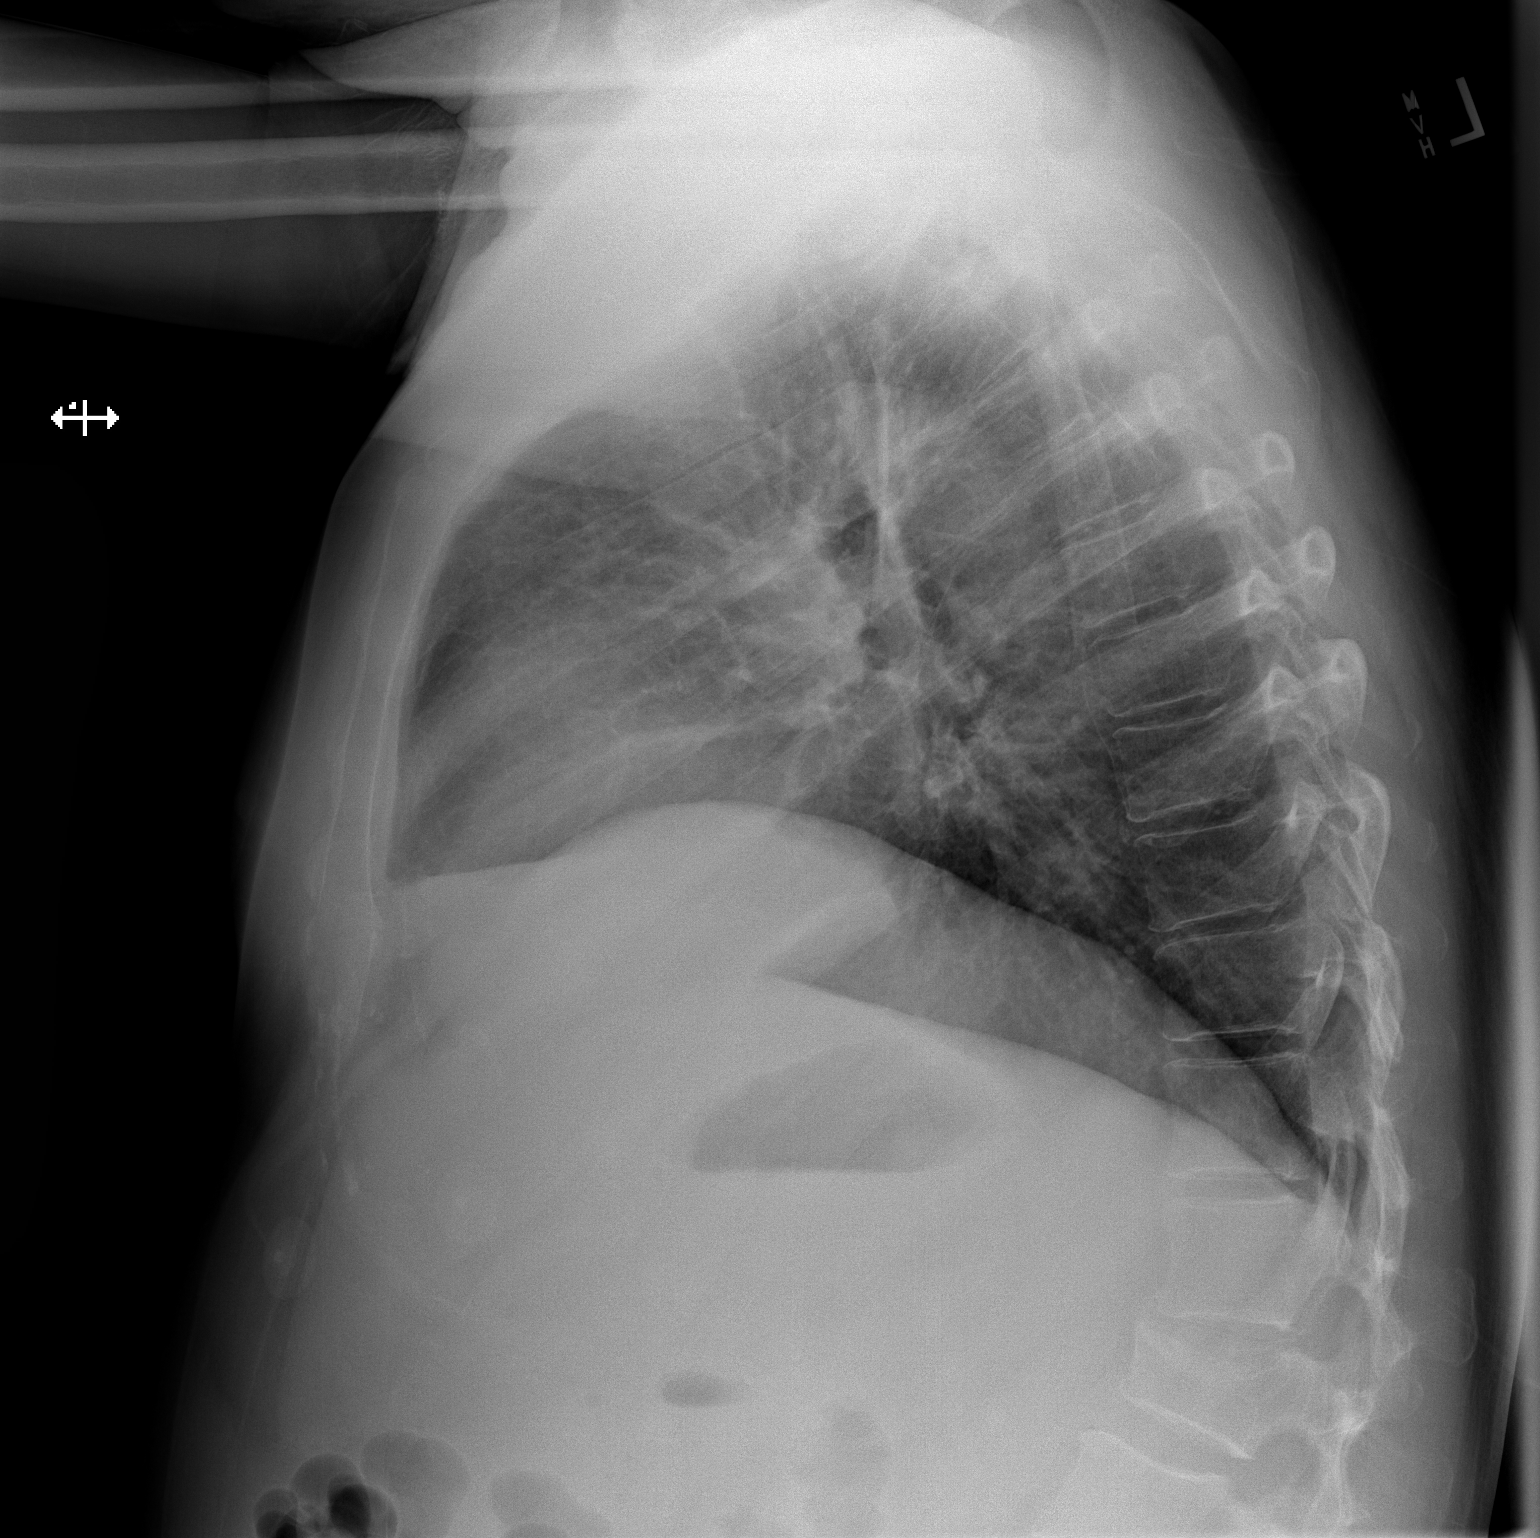

[2 of 2 positions shown; findings below may reference images not displayed]

FINDINGS: The heart size and mediastinal contours are within normal limits.
Both lungs are clear. The visualized skeletal structures are
unremarkable.
IMPRESSION: Normal study.

## 2020-09-30 ENCOUNTER — Other Ambulatory Visit: Payer: Self-pay

## 2021-06-23 ENCOUNTER — Other Ambulatory Visit: Payer: Self-pay | Admitting: Physician Assistant

## 2021-06-23 DIAGNOSIS — I10 Essential (primary) hypertension: Secondary | ICD-10-CM

## 2021-06-23 DIAGNOSIS — G629 Polyneuropathy, unspecified: Secondary | ICD-10-CM

## 2021-06-23 DIAGNOSIS — K2920 Alcoholic gastritis without bleeding: Secondary | ICD-10-CM

## 2021-06-23 NOTE — Telephone Encounter (Signed)
Requested medications are due for refill today.  yes  Requested medications are on the active medications list.  yes  Last refill. Gabapentin 01/15/2020 #90 3 refills, Lisinopril 07/14/2020 #90 3 refills, Prilosec 07/14/2020 #30 3 refills  Future visit scheduled.   no  Notes to clinic.  Per chart, no PCP. Pt last seen 03/17/2020.     Requested Prescriptions  Pending Prescriptions Disp Refills   gabapentin (NEURONTIN) 300 MG capsule 120 capsule 3    Sig: 1 tab in the morning and afternoon for neuropathy and 2 tabs at night     Neurology: Anticonvulsants - gabapentin Failed - 06/23/2021  4:02 PM      Failed - Cr in normal range and within 360 days    Creat  Date Value Ref Range Status  12/29/2019 1.11 0.60 - 1.35 mg/dL Final   Creatinine, Ser  Date Value Ref Range Status  03/17/2020 1.14 0.76 - 1.27 mg/dL Final          Failed - Completed PHQ-2 or PHQ-9 in the last 360 days      Failed - Valid encounter within last 12 months    Recent Outpatient Visits           1 year ago LUQ pain   [facility] [place], [place], [place]   1 year ago Hypokalemia   [facility] [place], [place], [place]   1 year ago Chronic alcoholic gastritis without hemorrhage   [facility] [place], [place], [place]               lisinopril (ZESTRIL) 10 MG tablet 90 tablet 3    Sig: Take 1 tablet (10 mg total) by mouth daily.     Cardiovascular:  ACE Inhibitors Failed - 06/23/2021  4:02 PM      Failed - Cr in normal range and within 180 days    Creat  Date Value Ref Range Status  12/29/2019 1.11 0.60 - 1.35 mg/dL Final   Creatinine, Ser  Date Value Ref Range Status  03/17/2020 1.14 0.76 - 1.27 mg/dL Final          Failed - K in normal range and within 180 days    Potassium  Date Value Ref Range Status  03/17/2020 3.6 3.5 - 5.2 mmol/L Final          Failed - Last BP in normal range    BP Readings from  Last 1 Encounters:  03/17/20 (!) 142/102          Failed - Valid encounter within last 6 months    Recent Outpatient Visits           1 year ago LUQ pain   [facility] [address] [facility] [place], [place], [place]   1 year ago Hypokalemia   [facility] [place], [place], [place]   1 year ago Chronic alcoholic gastritis without hemorrhage   [facility] [place], [place], [place] - Patient is not pregnant       omeprazole (PRILOSEC) 20 MG capsule 30 capsule 3    Sig: Take 1 capsule (20 mg total) by mouth daily.     Gastroenterology: Proton Pump Inhibitors Failed - 06/23/2021  4:02 PM      Failed - Valid encounter within last 12 months  Recent Outpatient Visits           1 year ago LUQ pain   [facility] [place], [place], [place]   1 year ago Hypokalemia   [facility] [place], [place], [place]   1 year ago Chronic alcoholic gastritis without hemorrhage   [facility] [place], [place], [place]

## 2021-06-23 NOTE — Telephone Encounter (Signed)
Medication: gabapentin (NEURONTIN) 300 MG capsule [[id number]] ,lisinopril (ZESTRIL) 10 MG tablet [[id number]] , omeprazole (PRILOSEC) 20 MG capsule [[id number]]   Has the patient contacted their pharmacy? YES Advised to Contact the office (Agent: If no, request that the patient contact the pharmacy for the refill. If patient does not wish to contact the pharmacy document the reason why and proceed with request.) (Agent: If yes, when and what did the pharmacy advise?)  Preferred Pharmacy (with phone number or street name): [facility] [address] [place], [place] [address] [phone] Has the patient been seen for an appointment in the last year OR does the patient have an upcoming appointment? YES   Agent: Please be advised that RX refills may take up to 3 business days. We ask that you follow-up with your pharmacy.

## 2021-09-22 ENCOUNTER — Encounter: Payer: Self-pay | Admitting: Infectious Diseases

## 2021-10-26 ENCOUNTER — Ambulatory Visit: Payer: Self-pay | Admitting: *Deleted

## 2021-10-26 ENCOUNTER — Encounter: Payer: Self-pay | Admitting: *Deleted

## 2021-10-26 NOTE — Telephone Encounter (Signed)
Call placed to patient and no answer. 

## 2021-10-26 NOTE — Telephone Encounter (Signed)
Summary: Needs refills, no appt available soon   Pt needs refills and has questions, he wants to speak to a nurse. Next available appt is not until July 20th seeking refills in the meantime.   Best contact: [phone]       Called patient to review options for medication refills until new patient visit. No answer, LVMTCB #[phone].

## 2021-10-26 NOTE — Telephone Encounter (Signed)
Found a new number for pt which he did not answer and there was not a voicemail option.

## 2021-10-26 NOTE — Telephone Encounter (Signed)
Called cell number twice still unable to reach. Will forward to office.

## 2021-10-26 NOTE — Telephone Encounter (Signed)
Unable to reach at number given, number on chart is the wrong number, someone answered and said wrong number quit calling. I sent MyChart message to return our call.

## 2021-11-17 ENCOUNTER — Ambulatory Visit: Payer: Self-pay | Admitting: Physician Assistant

## 2021-11-17 ENCOUNTER — Ambulatory Visit: Payer: Self-pay | Attending: Family Medicine | Admitting: Family Medicine

## 2021-11-17 ENCOUNTER — Encounter: Payer: Self-pay | Admitting: Family Medicine

## 2021-11-17 ENCOUNTER — Other Ambulatory Visit: Payer: Self-pay

## 2021-11-17 VITALS — BP 120/84 | HR 84 | Temp 98.2°F | Ht 71.0 in | Wt 188.6 lb

## 2021-11-17 DIAGNOSIS — K2920 Alcoholic gastritis without bleeding: Secondary | ICD-10-CM

## 2021-11-17 DIAGNOSIS — I10 Essential (primary) hypertension: Secondary | ICD-10-CM

## 2021-11-17 DIAGNOSIS — Z131 Encounter for screening for diabetes mellitus: Secondary | ICD-10-CM

## 2021-11-17 DIAGNOSIS — G621 Alcoholic polyneuropathy: Secondary | ICD-10-CM

## 2021-11-17 DIAGNOSIS — R252 Cramp and spasm: Secondary | ICD-10-CM

## 2021-11-17 MED ORDER — SUCRALFATE 1 G PO TABS
1.0000 g | ORAL_TABLET | Freq: Three times a day (TID) | ORAL | 3 refills | Status: AC
  Filled 2021-11-17: qty 120, 30d supply, fill #0

## 2021-11-17 MED ORDER — LISINOPRIL 2.5 MG PO TABS
2.5000 mg | ORAL_TABLET | Freq: Every day | ORAL | 3 refills | Status: DC
  Filled 2021-11-17: qty 30, 30d supply, fill #0
  Filled 2021-12-19: qty 30, 30d supply, fill #1
  Filled 2022-01-25: qty 30, 30d supply, fill #2

## 2021-11-17 MED ORDER — GABAPENTIN 300 MG PO CAPS
ORAL_CAPSULE | Freq: Three times a day (TID) | ORAL | 3 refills | Status: DC
  Filled 2021-11-17: qty 90, 30d supply, fill #0
  Filled 2021-12-19: qty 90, 30d supply, fill #1
  Filled 2022-01-25: qty 90, 30d supply, fill #2
  Filled 2022-02-27: qty 90, 30d supply, fill #3

## 2021-11-17 MED ORDER — OMEPRAZOLE 20 MG PO CPDR
20.0000 mg | DELAYED_RELEASE_CAPSULE | Freq: Every day | ORAL | 3 refills | Status: DC
  Filled 2021-11-17: qty 30, 30d supply, fill #0
  Filled 2021-12-19: qty 30, 30d supply, fill #1

## 2021-11-17 NOTE — Progress Notes (Signed)
Medication refills Acid reflux and heartburn Pain in feet and legs.

## 2021-11-17 NOTE — Progress Notes (Signed)
Subjective:  Patient ID: Patrick [name], male    DOB: May 27, 1975  Age: 46 y.o. MRN: [id number]  CC: Establish Care   HPI Patrick [name] is a 46 y.o. year old male with a history of alcoholic gastritis, alcohol abuse, hypertension who presents to establish care. Last seen in the clinic close to 2 years ago.  Interval History: He complains of heartburn which occurs through out the whole day and is not related to any specific foods. He took Tums last night. He was informed in the past he had PUD at a hospital in [facility] after an EGD.  Was previously on omeprazole and sucralfate which he has not been taking.  He states he sometimes gets headache and he feels his BP is elevated.  Denies blurry vision, nausea, vomiting, neck pain.  He does not have a blood pressure monitor to keep track of his blood pressure. He has not taken his antihypertensives in a while and his BP is normal.  He has 'needle like' feeling in his legs, calves are sore and he has cramps.  Sometimes he notices his toes flexing and getting curved and he has changed his shoes to prevent this.  Gabapentin is on his med list but he has not been taking it Labs reveal he was previously on potassium Past Medical History:  Diagnosis Date   Eczema    Hypertension    Migraine headache     Past Surgical History:  Procedure Laterality Date   BIOPSY  09/04/2019   Procedure: BIOPSY;  Surgeon: Jerline Pain, MD;  Location: [facility] ENDOSCOPY;  Service: Cardiovascular;;   ESOPHAGOGASTRODUODENOSCOPY (EGD) WITH PROPOFOL  09/04/2019   Procedure: ESOPHAGOGASTRODUODENOSCOPY (EGD) WITH PROPOFOL;  Surgeon: Jerline Pain, MD;  Location: [facility] ENDOSCOPY;  Service: Cardiovascular;;   TEE WITHOUT CARDIOVERSION N/A 09/04/2019   Procedure: TRANSESOPHAGEAL ECHOCARDIOGRAM (TEE);  Surgeon: Jerline Pain, MD;  Location: [facility] ENDOSCOPY;  Service: Cardiovascular;  Laterality: N/A;    Family History  Problem Relation Age of Onset   Cancer Mother     Social History    Socioeconomic History   Marital status: Single    Spouse name: Not on file   Number of children: Not on file   Years of education: Not on file   Highest education level: Not on file  Occupational History   Not on file  Tobacco Use   Smoking status: Every Day    Packs/day: 1.00    Types: Cigarettes   Smokeless tobacco: Never  Vaping Use   Vaping Use: Never used  Substance and Sexual Activity   Alcohol use: Yes    Comment: every day depends on day of week as to how much   Drug use: Yes    Types: Marijuana    Comment: every day   Sexual activity: Not on file  Other Topics Concern   Not on file  Social History Narrative   Not on file   Social Determinants of Health   Financial Resource Strain: Not on file  Food Insecurity: Not on file  Transportation Needs: Not on file  Physical Activity: Not on file  Stress: Not on file  Social Connections: Not on file    Allergies  Allergen Reactions   Mushroom Extract Complex Diarrhea, Itching and Other (See Comments)    Itchy throat, also    Outpatient Medications Prior to Visit  Medication Sig Dispense Refill   Glecaprevir-Pibrentasvir (MAVYRET) 100-40 MG TABS Take 3 tablets by mouth daily with breakfast. (Patient taking differently:  Take 3 tablets by mouth daily after supper.) 84 tablet 1   ibuprofen (ADVIL) 200 MG tablet Take 600-800 mg by mouth every 6 (six) hours as needed (for pain).     methocarbamol (ROBAXIN) 500 MG tablet Take 1 tablet (500 mg total) by mouth every 8 (eight) hours as needed for muscle spasms. 20 tablet 0   Multiple Vitamin (MULTIVITAMIN PO) Take 1 tablet by mouth daily as needed ("for nutritional purposes").      potassium chloride SA (KLOR-CON) 20 MEQ tablet Take 1 tablet (20 mEq total) by mouth daily. 30 tablet 2   Vitamin D, Ergocalciferol, (DRISDOL) 1.25 MG (50000 UNIT) CAPS capsule Take 1 capsule (50,000 Units total) by mouth every 7 (seven) days. 16 capsule 0   amLODipine (NORVASC) 10 MG tablet  Take 1 tablet (10 mg total) by mouth daily. 90 tablet 3   gabapentin (NEURONTIN) 300 MG capsule 1 tab in the morning and afternoon for neuropathy and 2 tabs at night 120 capsule 3   lisinopril (ZESTRIL) 10 MG tablet Take 1 tablet (10 mg total) by mouth daily. 90 tablet 3   omeprazole (PRILOSEC) 20 MG capsule Take 1 capsule (20 mg total) by mouth daily. 30 capsule 3   amoxicillin (AMOXIL) 500 MG capsule Take 1 capsule (500 mg total) by mouth 3 (three) times daily. (Patient not taking: Reported on 03/17/2020) 30 capsule 0   potassium chloride SA (KLOR-CON) 20 MEQ tablet TAKE 1 TABLET (20 MEQ TOTAL) BY MOUTH DAILY. 30 tablet 2   gabapentin (NEURONTIN) 300 MG capsule TAKE 1 CAPSULE (300 MG TOTAL) BY MOUTH 3 (THREE) TIMES DAILY. 90 capsule 3   lisinopril (ZESTRIL) 10 MG tablet TAKE 1 TABLET (10 MG TOTAL) BY MOUTH DAILY. 90 tablet 3   No facility-administered medications prior to visit.     ROS Review of Systems  Constitutional:  Negative for activity change and appetite change.  HENT:  Negative for sinus pressure and sore throat.   Respiratory:  Negative for chest tightness, shortness of breath and wheezing.   Cardiovascular:  Negative for chest pain and palpitations.  Gastrointestinal:  Negative for abdominal distention, abdominal pain and constipation.  Genitourinary: Negative.   Musculoskeletal:        See HPI  Neurological:  Positive for numbness and headaches.  Psychiatric/Behavioral:  Negative for behavioral problems and dysphoric mood.     Objective:  BP 120/84   Pulse 84   Temp 98.2 F (36.8 C) (Oral)   Ht 5\' 11"  (1.803 m)   Wt 188 lb 9.6 oz (85.5 kg)   SpO2 96%   BMI 26.30 kg/m      11/17/2021   10:26 AM 03/17/2020    3:33 PM 03/17/2020    3:32 PM  BP/Weight  Systolic BP 120 142 154  Diastolic BP 84 102 111  Wt. (Lbs) 188.6  178  BMI 26.3 kg/m2  24.83 kg/m2      Physical Exam Constitutional:      Appearance: He is well-developed.  Cardiovascular:     Rate  and Rhythm: Normal rate.     Heart sounds: Normal heart sounds. No murmur heard. Pulmonary:     Effort: Pulmonary effort is normal.     Breath sounds: Normal breath sounds. No wheezing or rales.  Chest:     Chest wall: No tenderness.  Abdominal:     General: Bowel sounds are normal. There is no distension.     Palpations: Abdomen is soft. There is no mass.  Tenderness: There is abdominal tenderness (slight epigastric TTP).  Musculoskeletal:        General: Normal range of motion.     Right lower leg: No edema.     Left lower leg: No edema.  Neurological:     Mental Status: He is alert and oriented to person, place, and time.  Psychiatric:        Mood and Affect: Mood normal.        Latest Ref Rng & Units 03/17/2020    3:54 PM 01/15/2020    3:59 PM 12/29/2019    3:24 PM  CMP  Glucose 65 - 99 mg/dL 109  102  89   BUN 6 - 24 mg/dL $Remove'9  13  9   'MLkZKZI$ Creatinine 0.76 - 1.27 mg/dL 1.14  0.97  1.11   Sodium 134 - 144 mmol/L 140  137  139   Potassium 3.5 - 5.2 mmol/L 3.6  2.8  2.6   Chloride 96 - 106 mmol/L 106  108  108   CO2 20 - 29 mmol/L $RemoveB'20  14  19   'SLSSIXMC$ Calcium 8.7 - 10.2 mg/dL 9.2  8.5  8.9   Total Protein 6.0 - 8.5 g/dL 7.3  7.1  6.9   Total Bilirubin 0.0 - 1.2 mg/dL 0.5  0.7  1.4   Alkaline Phos 44 - 121 IU/L 72  70    AST 0 - 40 IU/L $Remov'19  15  22   'YehTBw$ ALT 0 - 44 IU/L $Remov'13  7  10     'bkVmKU$ Lipid Panel  No results found for: "CHOL", "TRIG", "HDL", "CHOLHDL", "VLDL", "LDLCALC", "LDLDIRECT"  CBC    Component Value Date/Time   WBC 7.2 03/17/2020 1554   WBC 7.4 12/10/2019 0040   RBC 4.34 03/17/2020 1554   RBC 4.31 12/10/2019 0040   HGB 13.8 03/17/2020 1554   HCT 41.3 03/17/2020 1554   PLT 144 (L) 03/17/2020 1554   MCV 95 03/17/2020 1554   MCH 31.8 03/17/2020 1554   MCH 32.7 12/10/2019 0040   MCHC 33.4 03/17/2020 1554   MCHC 33.3 12/10/2019 0040   RDW 12.1 03/17/2020 1554   LYMPHSABS 1.9 03/17/2020 1554   MONOABS 0.4 09/05/2019 0539   EOSABS 0.2 03/17/2020 1554   BASOSABS 0.1  03/17/2020 1554    No results found for: "HGBA1C"  Assessment & Plan:  1. Chronic alcoholic gastritis without hemorrhage This could explain his abdominal symptoms Possibility of PUD is present We will treat and evaluate for H. pylori gastritis If persisting consider referral to GI - sucralfate (CARAFATE) 1 g tablet; Take 1 tablet (1 g total) by mouth 4 (four) times daily -  with meals and at bedtime.  Dispense: 120 tablet; Refill: 3 - omeprazole (PRILOSEC) 20 MG capsule; Take 1 capsule (20 mg total) by mouth daily.  Dispense: 30 capsule; Refill: 3 - H. pylori breath test - CBC with Differential/Platelet  2. Hypertension, unspecified type Controlled without medications I have discontinued amlodipine and decreased lisinopril from 10 mg to 2.5 mg He will obtain a blood pressure monitor and keep a check of his blood pressures at home Counseled on blood pressure goal of less than 130/80, low-sodium, DASH diet, medication compliance, 150 minutes of moderate intensity exercise per week. Discussed medication compliance, adverse effects. - CMP14+EGFR - lisinopril (ZESTRIL) 2.5 MG tablet; Take 1 tablet (2.5 mg total) by mouth daily.  Dispense: 30 tablet; Refill: 3  3. Muscle cramps He was previously on potassium supplements we will check his potassium  level today  4. Neuropathy, alcoholic ([place]) He has been out of gabapentin which I have refilled Strongly encouraged to work on quitting alcohol - gabapentin (NEURONTIN) 300 MG capsule; TAKE 1 CAPSULE (300 MG TOTAL) BY MOUTH 3 (THREE) TIMES DAILY.  Dispense: 90 capsule; Refill: 3 - Vitamin B12 - Magnesium  5. Screening for diabetes mellitus Given neuropathy we will screen for diabetes - Hemoglobin A1c    Meds ordered this encounter  Medications   sucralfate (CARAFATE) 1 g tablet    Sig: Take 1 tablet (1 g total) by mouth 4 (four) times daily -  with meals and at bedtime.    Dispense:  120 tablet    Refill:  3   gabapentin (NEURONTIN)  300 MG capsule    Sig: TAKE 1 CAPSULE (300 MG TOTAL) BY MOUTH 3 (THREE) TIMES DAILY.    Dispense:  90 capsule    Refill:  3   omeprazole (PRILOSEC) 20 MG capsule    Sig: Take 1 capsule (20 mg total) by mouth daily.    Dispense:  30 capsule    Refill:  3   lisinopril (ZESTRIL) 2.5 MG tablet    Sig: Take 1 tablet (2.5 mg total) by mouth daily.    Dispense:  30 tablet    Refill:  3    Follow-up: Return in about 3 months (around 02/17/2022), or Chronic medical conditions.Charlott Rakes, MD, FAAFP. [facility] [place], [name]   11/17/2021, 11:29 AM

## 2021-11-17 NOTE — Patient Instructions (Signed)
Neuropathic Pain Neuropathic pain is pain caused by damage to the nerves that are responsible for certain sensations in your body (sensory nerves). Neuropathic pain can make you more sensitive to pain. Even a minor sensation can feel very painful. This is usually a long-term (chronic) condition that can be difficult to treat. The type of pain differs from person to person. It may: Start suddenly (acute), or it may develop slowly and become chronic. Come and go as damaged nerves heal, or it may stay at the same level for years. Cause emotional distress, loss of sleep, and a lower quality of life. What are the causes? The most common cause of this condition is diabetes. Many other diseases and conditions can also cause neuropathic pain. Causes of neuropathic pain can be classified as: Toxic. This is caused by medicines and chemicals. The most common causes of toxic neuropathic pain is damage from medicines that kill cancer cells (chemotherapy) or alcohol abuse. Metabolic. This can be caused by: Diabetes. Lack of vitamins like B12. Traumatic. Any injury that cuts, crushes, or stretches a nerve can cause damage and pain. Compression-related. If a sensory nerve gets trapped or compressed for a long period of time, the blood supply to the nerve can be cut off. Vascular. Many blood vessel diseases can cause neuropathic pain by decreasing blood supply and oxygen to nerves. Autoimmune. This type of pain results from diseases in which the body's defense system (immune system) mistakenly attacks sensory nerves. Examples of autoimmune diseases that can cause neuropathic pain include lupus and multiple sclerosis. Infectious. Many types of viral infections can damage sensory nerves and cause pain. Shingles infection is a common cause of this type of pain. Inherited. Neuropathic pain can be a symptom of many diseases that are passed down through families (genetic). What increases the risk? You are more likely to  develop this condition if: You have diabetes. You smoke. You drink too much alcohol. You are taking certain medicines, including chemotherapy or medicines that treat immune system disorders. What are the signs or symptoms? The main symptom is pain. Neuropathic pain is often described as: Burning. Shock-like. Stinging. Hot or cold. Itching. How is this diagnosed? No single test can diagnose neuropathic pain. It is diagnosed based on: A physical exam and your symptoms. Your health care provider will ask you about your pain. You may be asked to use a pain scale to describe how bad your pain is. Tests. These may be done to see if you have a cause and location of any nerve damage. They include: Nerve conduction studies and electromyography to test how well nerve signals travel through your nerves and muscles (electrodiagnostic testing). Skin biopsy to evaluate for small fiber neuropathy. Imaging studies, such as: X-rays. CT scan. MRI. How is this treated? Treatment for neuropathic pain may change over time. You may need to try different treatment options or a combination of treatments. Some options include: Treating the underlying cause of the neuropathy, such as diabetes, kidney disease, or vitamin deficiencies. Stopping medicines that can cause neuropathy, such as chemotherapy. Medicine to relieve pain. Medicines may include: Prescription or over-the-counter pain medicine. Anti-seizure medicine. Antidepressant medicines. Pain-relieving patches or creams that are applied to painful areas of skin. A medicine to numb the area (local anesthetic), which can be injected as a nerve block. Transcutaneous nerve stimulation. This uses electrical currents to block painful nerve signals. The treatment is painless. Alternative treatments, such as: Acupuncture. Meditation. Massage. Occupational or physical therapy. Pain management programs. Counseling. Follow   these instructions at  home: Medicines  Take over-the-counter and prescription medicines only as told by your health care provider. Ask your health care provider if the medicine prescribed to you: Requires you to avoid driving or using machinery. Can cause constipation. You may need to take these actions to prevent or treat constipation: Drink enough fluid to keep your urine pale yellow. Take over-the-counter or prescription medicines. Eat foods that are high in fiber, such as beans, whole grains, and fresh fruits and vegetables. Limit foods that are high in fat and processed sugars, such as fried or sweet foods. Lifestyle  Have a good support system at home. Consider joining a chronic pain support group. Do not use any products that contain nicotine or tobacco. These products include cigarettes, chewing tobacco, and vaping devices, such as e-cigarettes. If you need help quitting, ask your health care provider. Do not drink alcohol. General instructions Learn as much as you can about your condition. Work closely with all your health care providers to find the treatment plan that works best for you. Ask your health care provider what activities are safe for you. Keep all follow-up visits. This is important. Contact a health care provider if: Your pain treatments are not working. You are having side effects from your medicines. You are struggling with tiredness (fatigue), mood changes, depression, or anxiety. Get help right away if: You have thoughts of hurting yourself. Get help right away if you feel like you may hurt yourself or others, or have thoughts about taking your own life. Go to your nearest emergency room or: Call 911. Call the National Suicide Prevention Lifeline at 1-800-273-8255 or 988. This is open 24 hours a day. Text the Crisis Text Line at 741741. Summary Neuropathic pain is pain caused by damage to the nerves that are responsible for certain sensations in your body (sensory  nerves). Neuropathic pain may come and go as damaged nerves heal, or it may stay at the same level for years. Neuropathic pain is usually a long-term condition that can be difficult to treat. Consider joining a chronic pain support group. This information is not intended to replace advice given to you by your health care provider. Make sure you discuss any questions you have with your health care provider. Document Revised: 12/13/2020 Document Reviewed: 12/13/2020 Elsevier Patient Education  2023 [employer]  

## 2021-11-18 LAB — CMP14+EGFR
ALT: 22 IU/L (ref 0–44)
AST: 26 IU/L (ref 0–40)
Albumin/Globulin Ratio: 1.6 (ref 1.2–2.2)
Albumin: 4.3 g/dL (ref 4.1–5.1)
Alkaline Phosphatase: 58 IU/L (ref 44–121)
BUN/Creatinine Ratio: 11 (ref 9–20)
BUN: 13 mg/dL (ref 6–24)
Bilirubin Total: 0.3 mg/dL (ref 0.0–1.2)
CO2: 18 mmol/L — ABNORMAL LOW (ref 20–29)
Calcium: 8.7 mg/dL (ref 8.7–10.2)
Chloride: 108 mmol/L — ABNORMAL HIGH (ref 96–106)
Creatinine, Ser: 1.18 mg/dL (ref 0.76–1.27)
Globulin, Total: 2.7 g/dL (ref 1.5–4.5)
Glucose: 119 mg/dL — ABNORMAL HIGH (ref 70–99)
Potassium: 4.5 mmol/L (ref 3.5–5.2)
Sodium: 141 mmol/L (ref 134–144)
Total Protein: 7 g/dL (ref 6.0–8.5)
eGFR: 77 mL/min/{1.73_m2} (ref >59)

## 2021-11-18 LAB — CBC WITH DIFFERENTIAL/PLATELET
Basophils Absolute: 0.1 10*3/uL (ref 0.0–0.2)
Basos: 1 %
EOS (ABSOLUTE): 0.2 10*3/uL (ref 0.0–0.4)
Eos: 3 %
Hematocrit: 41.1 % (ref 37.5–51.0)
Hemoglobin: 14 g/dL (ref 13.0–17.7)
Immature Grans (Abs): 0 10*3/uL (ref 0.0–0.1)
Immature Granulocytes: 0 %
Lymphocytes Absolute: 1.9 10*3/uL (ref 0.7–3.1)
Lymphs: 33 %
MCH: 33.3 pg — ABNORMAL HIGH (ref 26.6–33.0)
MCHC: 34.1 g/dL (ref 31.5–35.7)
MCV: 98 fL — ABNORMAL HIGH (ref 79–97)
Monocytes Absolute: 0.4 10*3/uL (ref 0.1–0.9)
Monocytes: 8 %
Neutrophils Absolute: 3.1 10*3/uL (ref 1.4–7.0)
Neutrophils: 55 %
Platelets: 103 10*3/uL — ABNORMAL LOW (ref 150–450)
RBC: 4.2 x10E6/uL (ref 4.14–5.80)
RDW: 12.5 % (ref 11.6–15.4)
WBC: 5.7 10*3/uL (ref 3.4–10.8)

## 2021-11-18 LAB — H. PYLORI BREATH TEST: H pylori Breath Test: NEGATIVE

## 2021-11-18 LAB — MAGNESIUM: Magnesium: 2 mg/dL (ref 1.6–2.3)

## 2021-11-18 LAB — HEMOGLOBIN A1C
Est. average glucose Bld gHb Est-mCnc: 108 mg/dL
Hgb A1c MFr Bld: 5.4 % (ref 4.8–5.6)

## 2021-11-18 LAB — VITAMIN B12: Vitamin B-12: 257 pg/mL (ref 232–1245)

## 2021-12-19 ENCOUNTER — Other Ambulatory Visit: Payer: Self-pay

## 2021-12-20 ENCOUNTER — Other Ambulatory Visit: Payer: Self-pay

## 2021-12-29 ENCOUNTER — Other Ambulatory Visit: Payer: Self-pay

## 2021-12-29 ENCOUNTER — Ambulatory Visit: Payer: Self-pay | Admitting: *Deleted

## 2021-12-29 ENCOUNTER — Telehealth: Payer: Self-pay | Admitting: Physician Assistant

## 2021-12-29 DIAGNOSIS — B9689 Other specified bacterial agents as the cause of diseases classified elsewhere: Secondary | ICD-10-CM

## 2021-12-29 DIAGNOSIS — J019 Acute sinusitis, unspecified: Secondary | ICD-10-CM

## 2021-12-29 MED ORDER — AMOXICILLIN-POT CLAVULANATE 875-125 MG PO TABS
1.0000 | ORAL_TABLET | Freq: Two times a day (BID) | ORAL | 0 refills | Status: DC
  Filled 2021-12-29: qty 14, 7d supply, fill #0

## 2021-12-29 MED ORDER — FLUTICASONE PROPIONATE 50 MCG/ACT NA SUSP
2.0000 | Freq: Every day | NASAL | 0 refills | Status: AC
  Filled 2021-12-29: qty 16, 30d supply, fill #0

## 2021-12-29 NOTE — Patient Instructions (Signed)
Loma Messing, thank you for joining [name], PA-C for today's virtual visit.  While this provider is not your primary care provider (PCP), if your PCP is located in our provider database this encounter information will be shared with them immediately following your visit.  Consent: (Patient) Patrick [name] provided verbal consent for this virtual visit at the beginning of the encounter.  Current Medications:  Current Outpatient Medications:    amoxicillin (AMOXIL) 500 MG capsule, Take 1 capsule (500 mg total) by mouth 3 (three) times daily. (Patient not taking: Reported on 03/17/2020), Disp: 30 capsule, Rfl: 0   gabapentin (NEURONTIN) 300 MG capsule, TAKE 1 CAPSULE (300 MG TOTAL) BY MOUTH 3 (THREE) TIMES DAILY., Disp: 90 capsule, Rfl: 3   Glecaprevir-Pibrentasvir (MAVYRET) 100-40 MG TABS, Take 3 tablets by mouth daily with breakfast. (Patient taking differently: Take 3 tablets by mouth daily after supper.), Disp: 84 tablet, Rfl: 1   ibuprofen (ADVIL) 200 MG tablet, Take 600-800 mg by mouth every 6 (six) hours as needed (for pain)., Disp: , Rfl:    lisinopril (ZESTRIL) 2.5 MG tablet, Take 1 tablet (2.5 mg total) by mouth daily., Disp: 30 tablet, Rfl: 3   methocarbamol (ROBAXIN) 500 MG tablet, Take 1 tablet (500 mg total) by mouth every 8 (eight) hours as needed for muscle spasms., Disp: 20 tablet, Rfl: 0   Multiple Vitamin (MULTIVITAMIN PO), Take 1 tablet by mouth daily as needed ("for nutritional purposes"). , Disp: , Rfl:    omeprazole (PRILOSEC) 20 MG capsule, Take 1 capsule (20 mg total) by mouth daily., Disp: 30 capsule, Rfl: 3   potassium chloride SA (KLOR-CON) 20 MEQ tablet, Take 1 tablet (20 mEq total) by mouth daily., Disp: 30 tablet, Rfl: 2   potassium chloride SA (KLOR-CON) 20 MEQ tablet, TAKE 1 TABLET (20 MEQ TOTAL) BY MOUTH DAILY., Disp: 30 tablet, Rfl: 2   sucralfate (CARAFATE) 1 g tablet, Take 1 tablet (1 g total) by mouth 4 (four) times daily -  with meals and at  bedtime., Disp: 120 tablet, Rfl: 3   Vitamin D, Ergocalciferol, (DRISDOL) 1.25 MG (50000 UNIT) CAPS capsule, Take 1 capsule (50,000 Units total) by mouth every 7 (seven) days., Disp: 16 capsule, Rfl: 0   Medications ordered in this encounter:  No orders of the defined types were placed in this encounter.    *If you need refills on other medications prior to your next appointment, please contact your pharmacy*  Follow-Up: Call back or seek an in-person evaluation if the symptoms worsen or if the condition fails to improve as anticipated.  Other Instructions Please take antibiotic as directed.  Increase fluid intake.  Use Saline nasal spray.  Take a daily multivitamin. Flonase per orders.  Place a humidifier in the bedroom.  Please call or return clinic if symptoms are not improving.  Sinusitis Sinusitis is redness, soreness, and swelling (inflammation) of the paranasal sinuses. Paranasal sinuses are air pockets within the bones of your face (beneath the eyes, the middle of the forehead, or above the eyes). In healthy paranasal sinuses, mucus is able to drain out, and air is able to circulate through them by way of your nose. However, when your paranasal sinuses are inflamed, mucus and air can become trapped. This can allow bacteria and other germs to grow and cause infection. Sinusitis can develop quickly and last only a short time (acute) or continue over a long period (chronic). Sinusitis that lasts for more than 12 weeks is considered chronic.  CAUSES  Causes of sinusitis include: Allergies. Structural abnormalities, such as displacement of the cartilage that separates your nostrils (deviated septum), which can decrease the air flow through your nose and sinuses and affect sinus drainage. Functional abnormalities, such as when the small hairs (cilia) that line your sinuses and help remove mucus do not work properly or are not present. SYMPTOMS  Symptoms of acute and chronic sinusitis are  the same. The primary symptoms are pain and pressure around the affected sinuses. Other symptoms include: Upper toothache. Earache. Headache. Bad breath. Decreased sense of smell and taste. A cough, which worsens when you are lying flat. Fatigue. Fever. Thick drainage from your nose, which often is green and may contain pus (purulent). Swelling and warmth over the affected sinuses. DIAGNOSIS  Your caregiver will perform a physical exam. During the exam, your caregiver may: Look in your nose for signs of abnormal growths in your nostrils (nasal polyps). Tap over the affected sinus to check for signs of infection. View the inside of your sinuses (endoscopy) with a special imaging device with a light attached (endoscope), which is inserted into your sinuses. If your caregiver suspects that you have chronic sinusitis, one or more of the following tests may be recommended: Allergy tests. Nasal culture A sample of mucus is taken from your nose and sent to a lab and screened for bacteria. Nasal cytology A sample of mucus is taken from your nose and examined by your caregiver to determine if your sinusitis is related to an allergy. TREATMENT  Most cases of acute sinusitis are related to a viral infection and will resolve on their own within 10 days. Sometimes medicines are prescribed to help relieve symptoms (pain medicine, decongestants, nasal steroid sprays, or saline sprays).  However, for sinusitis related to a bacterial infection, your caregiver will prescribe antibiotic medicines. These are medicines that will help kill the bacteria causing the infection.  Rarely, sinusitis is caused by a fungal infection. In theses cases, your caregiver will prescribe antifungal medicine. For some cases of chronic sinusitis, surgery is needed. Generally, these are cases in which sinusitis recurs more than 3 times per year, despite other treatments. HOME CARE INSTRUCTIONS  Drink plenty of water. Water helps  thin the mucus so your sinuses can drain more easily. Use a humidifier. Inhale steam 3 to 4 times a day (for example, sit in the bathroom with the shower running). Apply a warm, moist washcloth to your face 3 to 4 times a day, or as directed by your caregiver. Use saline nasal sprays to help moisten and clean your sinuses. Take over-the-counter or prescription medicines for pain, discomfort, or fever only as directed by your caregiver. SEEK IMMEDIATE MEDICAL CARE IF: You have increasing pain or severe headaches. You have nausea, vomiting, or drowsiness. You have swelling around your face. You have vision problems. You have a stiff neck. You have difficulty breathing. MAKE SURE YOU:  Understand these instructions. Will watch your condition. Will get help right away if you are not doing well or get worse. Document Released: 04/17/2005 Document Revised: 07/10/2011 Document Reviewed: 05/02/2011 [facility] Patient Information 2014 [place], [place].    If you have been instructed to have an in-person evaluation today at a local Urgent Care facility, please use the link below. It will take you to a list of all of our available [facility], including address, phone number and hours of operation. Please do not delay care.  [facility]  If you or a family  member do not have a primary care provider, use the link below to schedule a visit and establish care. When you choose a [facility] primary care physician or advanced practice provider, you gain a long-term partner in health. Find a Primary Care Provider  Learn more about [facility]'s in-office and virtual care options: [facility]

## 2021-12-29 NOTE — Progress Notes (Signed)
Virtual Visit Consent   Patrick [name], you are scheduled for a virtual visit with a [facility] provider today. Just as with appointments in the office, your consent must be obtained to participate. Your consent will be active for this visit and any virtual visit you may have with one of our providers in the next 365 days. If you have a MyChart account, a copy of this consent can be sent to you electronically.  As this is a virtual visit, video technology does not allow for your provider to perform a traditional examination. This may limit your provider's ability to fully assess your condition. If your provider identifies any concerns that need to be evaluated in person or the need to arrange testing (such as labs, EKG, etc.), we will make arrangements to do so. Although advances in technology are sophisticated, we cannot ensure that it will always work on either your end or our end. If the connection with a video visit is poor, the visit may have to be switched to a telephone visit. With either a video or telephone visit, we are not always able to ensure that we have a secure connection.  By engaging in this virtual visit, you consent to the provision of healthcare and authorize for your insurance to be billed (if applicable) for the services provided during this visit. Depending on your insurance coverage, you may receive a charge related to this service.  I need to obtain your verbal consent now. Are you willing to proceed with your visit today? Patrick [name] has provided verbal consent on 12/29/2021 for a virtual visit (video or telephone). Patrick [name], [place]  Date: 12/29/2021 12:37 PM  Virtual Visit via Video Note   I, Patrick [name], connected with  Patrick [name]  ([id number], [birth year]) on 12/29/21 at 12:30 PM EDT by a video-enabled telemedicine application and verified that I am speaking with the correct person using two identifiers.  Location: Patient: Virtual Visit Location Patient:  Home Provider: Virtual Visit Location Provider: Home Office   I discussed the limitations of evaluation and management by telemedicine and the availability of in person appointments. The patient expressed understanding and agreed to proceed.    History of Present Illness: Patrick [name] is a 46 y.o. who identifies as a male who was assigned male at birth, and is being seen today for possible sinusitis. Notes 1 week or so of worsening nasal/head congestion with sinus pressure. Now with substantial sinus pain and upper teeth/cheek pain. Denies fever at present. Has taken OTC Tylenol.   HPI: HPI  Problems:  Patient Active Problem List   Diagnosis Date Noted   Acne 10/10/2019   Chronic hepatitis C without hepatic coma (HCC) 09/16/2019   Gastritis 09/16/2019   Bacteremia due to Staphylococcus 08/29/2019   Hypokalemia    Sepsis with acute liver failure without hepatic coma or septic shock (HCC)    Transaminitis    Alcohol withdrawal (HCC) 08/28/2019   Closed compression fracture of L2 lumbar vertebra, initial encounter (HCC) 08/26/2017   Behavior change due to substance use 02/27/2017   Alcohol use disorder, severe, dependence (HCC) 02/27/2017    Allergies:  Allergies  Allergen Reactions   Mushroom Extract Complex Diarrhea, Itching and Other (See Comments)    Itchy throat, also   Medications:  Current Outpatient Medications:    amoxicillin-clavulanate (AUGMENTIN) 875-125 MG tablet, Take 1 tablet by mouth 2 (two) times daily., Disp: 14 tablet, Rfl: 0   fluticasone (FLONASE) 50 MCG/ACT nasal spray, Place 2  sprays into both nostrils daily., Disp: 16 g, Rfl: 0   gabapentin (NEURONTIN) 300 MG capsule, TAKE 1 CAPSULE (300 MG TOTAL) BY MOUTH 3 (THREE) TIMES DAILY., Disp: 90 capsule, Rfl: 3   Glecaprevir-Pibrentasvir (MAVYRET) 100-40 MG TABS, Take 3 tablets by mouth daily with breakfast. (Patient taking differently: Take 3 tablets by mouth daily after supper.), Disp: 84 tablet, Rfl: 1   ibuprofen  (ADVIL) 200 MG tablet, Take 600-800 mg by mouth every 6 (six) hours as needed (for pain)., Disp: , Rfl:    lisinopril (ZESTRIL) 2.5 MG tablet, Take 1 tablet (2.5 mg total) by mouth daily., Disp: 30 tablet, Rfl: 3   methocarbamol (ROBAXIN) 500 MG tablet, Take 1 tablet (500 mg total) by mouth every 8 (eight) hours as needed for muscle spasms., Disp: 20 tablet, Rfl: 0   Multiple Vitamin (MULTIVITAMIN PO), Take 1 tablet by mouth daily as needed ("for nutritional purposes"). , Disp: , Rfl:    omeprazole (PRILOSEC) 20 MG capsule, Take 1 capsule (20 mg total) by mouth daily., Disp: 30 capsule, Rfl: 3   potassium chloride SA (KLOR-CON) 20 MEQ tablet, Take 1 tablet (20 mEq total) by mouth daily., Disp: 30 tablet, Rfl: 2   potassium chloride SA (KLOR-CON) 20 MEQ tablet, TAKE 1 TABLET (20 MEQ TOTAL) BY MOUTH DAILY., Disp: 30 tablet, Rfl: 2   sucralfate (CARAFATE) 1 g tablet, Take 1 tablet (1 g total) by mouth 4 (four) times daily -  with meals and at bedtime., Disp: 120 tablet, Rfl: 3   Vitamin D, Ergocalciferol, (DRISDOL) 1.25 MG (50000 UNIT) CAPS capsule, Take 1 capsule (50,000 Units total) by mouth every 7 (seven) days., Disp: 16 capsule, Rfl: 0  Observations/Objective: Patient is well-developed, well-nourished in no acute distress.  Resting comfortably at home.  Head is normocephalic, atraumatic.  No labored breathing. Speech is clear and coherent with logical content.  Patient is alert and oriented at baseline.   Assessment and Plan: 1. Acute bacterial sinusitis - amoxicillin-clavulanate (AUGMENTIN) 875-125 MG tablet; Take 1 tablet by mouth 2 (two) times daily.  Dispense: 14 tablet; Refill: 0 - fluticasone (FLONASE) 50 MCG/ACT nasal spray; Place 2 sprays into both nostrils daily.  Dispense: 16 g; Refill: 0  Rx Augmentin.  Increase fluids.  Rest.  Saline nasal spray.  Probiotic.  Mucinex as directed.  Humidifier in bedroom. Flonase per orders.  Call or return to clinic if symptoms are not  improving.   Follow Up Instructions: I discussed the assessment and treatment plan with the patient. The patient was provided an opportunity to ask questions and all were answered. The patient agreed with the plan and demonstrated an understanding of the instructions.  A copy of instructions were sent to the patient via MyChart unless otherwise noted below.   The patient was advised to call back or seek an in-person evaluation if the symptoms worsen or if the condition fails to improve as anticipated.  Time:  I spent 10 minutes with the patient via telehealth technology discussing the above problems/concerns.    Patrick Climes, PA-C

## 2021-12-29 NOTE — Telephone Encounter (Signed)
Noted  

## 2021-12-29 NOTE — Telephone Encounter (Signed)
Summary: Possible sinus infection   Patient uses the [facility]   [facility] at [facility]  Phone: [phone]  Fax: [phone]   ----- Message from Kandis Cocking sent at 12/29/2021  9:31 AM EDT -----  Patient called in stating he woke up in the middle of the night with pain in his front teeth. He feels like he may have a snius infection. He cannot take aspirin or ibuprofen but wants to know if something can be called in to help if its an infection or an antibiotic since he has bad teeth. Patient doesn't have another appointment with the provider until October 17th. Please assist patient further       Have tried home and cell number. Cell does not have voicemail set up, home phone states try your call later.

## 2021-12-29 NOTE — Telephone Encounter (Signed)
Chief Complaint: Sinus congestion, facial pain Symptoms: Cough, nasal congestion, pain to upper teeth 7/10 Frequency: Few days congestion, pain last night Pertinent Negatives: Patient denies fever Disposition: [] ED /[] Urgent Care (no appt availability in office) / [] Appointment(In office/virtual)/ [x]  [facility]/ [] Home Care/ [] Refused Recommended Disposition /[] [facility]/ []  Follow-up with PCP Additional Notes: No available appointments in office with any provider, scheduled virtual UC today at 1230.     Summary: Possible sinus infection   Patient uses the [facility]   [facility] at [facility]  Phone: [phone]  Fax: [phone]   ----- Message from sent at 12/29/2021  9:31 AM EDT -----  Patient called in stating he woke up in the middle of the night with pain in his front teeth. He feels like he may have a snius infection. He cannot take aspirin or ibuprofen but wants to know if something can be called in to help if its an infection or an antibiotic since he has bad teeth. Patient doesn't have another appointment with the provider until October 17th. Please assist patient further      Reason for Disposition  [1] Sinus congestion (pressure, fullness) AND [2] present > 10 days  Answer Assessment - Initial Assessment Questions 1. LOCATION: "Where does it hurt?"      Upper front teeth hurts, pressure to the sinuses 2. ONSET: "When did the sinus pain start?"  (e.g., hours, days)      Few days, last night the pain 3. SEVERITY: "How bad is the pain?"   (Scale 1-10; mild, moderate or severe)   - MILD (1-3): doesn't interfere with normal activities    - MODERATE (4-7): interferes with normal activities (e.g., work or school) or awakens from sleep   - SEVERE (8-10): excruciating pain and patient unable to do any normal activities        7 4. RECURRENT SYMPTOM: "Have you ever had sinus problems before?" If  Yes, ask: "When was the last time?" and "What happened that time?"      No 5. NASAL CONGESTION: "Is the nose blocked?" If Yes, ask: "Can you open it or must you breathe through your mouth?"     Yes, but not like normal 6. NASAL DISCHARGE: "Do you have discharge from your nose?" If so ask, "What color?"     Yes when blow, yellowish 7. FEVER: "Do you have a fever?" If Yes, ask: "What is it, how was it measured, and when did it start?"      No 8. OTHER SYMPTOMS: "Do you have any other symptoms?" (e.g., sore throat, cough, earache, difficulty breathing)     Cough sometimes, drainage down back of throat 9. PREGNANCY: "Is there any chance you are pregnant?" "When was your last menstrual period?"     N/A  Protocols used: Sinus Pain or Congestion-A-AH

## 2022-01-03 ENCOUNTER — Emergency Department (HOSPITAL_COMMUNITY)
Admission: EM | Admit: 2022-01-03 | Discharge: 2022-01-03 | Disposition: A | Discharge status: Home / Self Care | Payer: Self-pay | Attending: Emergency Medicine | Admitting: Emergency Medicine

## 2022-01-03 ENCOUNTER — Encounter (HOSPITAL_COMMUNITY): Payer: Self-pay

## 2022-01-03 ENCOUNTER — Other Ambulatory Visit: Payer: Self-pay

## 2022-01-03 DIAGNOSIS — K279 Peptic ulcer, site unspecified, unspecified as acute or chronic, without hemorrhage or perforation: Secondary | ICD-10-CM | POA: Insufficient documentation

## 2022-01-03 DIAGNOSIS — F1090 Alcohol use, unspecified, uncomplicated: Secondary | ICD-10-CM

## 2022-01-03 DIAGNOSIS — F1020 Alcohol dependence, uncomplicated: Secondary | ICD-10-CM | POA: Insufficient documentation

## 2022-01-03 LAB — COMPREHENSIVE METABOLIC PANEL
ALT: 33 U/L (ref 0–44)
AST: 46 U/L — ABNORMAL HIGH (ref 15–41)
Albumin: 4.1 g/dL (ref 3.5–5.0)
Alkaline Phosphatase: 55 U/L (ref 38–126)
Anion gap: 10 (ref 5–15)
BUN: 14 mg/dL (ref 6–20)
CO2: 19 mmol/L — ABNORMAL LOW (ref 22–32)
Calcium: 8.7 mg/dL — ABNORMAL LOW (ref 8.9–10.3)
Chloride: 110 mmol/L (ref 98–111)
Creatinine, Ser: 1 mg/dL (ref 0.61–1.24)
GFR, Estimated: >60 mL/min (ref >60)
Glucose, Bld: 127 mg/dL — ABNORMAL HIGH (ref 70–99)
Potassium: 3.8 mmol/L (ref 3.5–5.1)
Sodium: 139 mmol/L (ref 135–145)
Total Bilirubin: 0.6 mg/dL (ref 0.3–1.2)
Total Protein: 7.9 g/dL (ref 6.5–8.1)

## 2022-01-03 LAB — CBC
HCT: 43 % (ref 39.0–52.0)
Hemoglobin: 15.1 g/dL (ref 13.0–17.0)
MCH: 34.5 pg — ABNORMAL HIGH (ref 26.0–34.0)
MCHC: 35.1 g/dL (ref 30.0–36.0)
MCV: 98.2 fL (ref 80.0–100.0)
Platelets: 101 10*3/uL — ABNORMAL LOW (ref 150–400)
RBC: 4.38 MIL/uL (ref 4.22–5.81)
RDW: 13.4 % (ref 11.5–15.5)
WBC: 5.5 10*3/uL (ref 4.0–10.5)
nRBC: 0 % (ref 0.0–0.2)

## 2022-01-03 LAB — URINALYSIS, ROUTINE W REFLEX MICROSCOPIC
Bilirubin Urine: NEGATIVE
Glucose, UA: NEGATIVE mg/dL
Hgb urine dipstick: NEGATIVE
Ketones, ur: NEGATIVE mg/dL
Leukocytes,Ua: NEGATIVE
Nitrite: NEGATIVE
Protein, ur: NEGATIVE mg/dL
Specific Gravity, Urine: 1.013 (ref 1.005–1.030)
pH: 6 (ref 5.0–8.0)

## 2022-01-03 LAB — LIPASE, BLOOD: Lipase: 45 U/L (ref 11–51)

## 2022-01-03 MED ORDER — ALUM & MAG HYDROXIDE-SIMETH 200-200-20 MG/5ML PO SUSP
30.0000 mL | Freq: Once | ORAL | Status: AC
  Administered 2022-01-03: 30 mL via ORAL
  Filled 2022-01-03: qty 30

## 2022-01-03 MED ORDER — CHLORDIAZEPOXIDE HCL 25 MG PO CAPS
50.0000 mg | ORAL_CAPSULE | Freq: Once | ORAL | Status: AC
  Administered 2022-01-03: 50 mg via ORAL
  Filled 2022-01-03: qty 2

## 2022-01-03 MED ORDER — PANTOPRAZOLE SODIUM 40 MG PO TBEC
40.0000 mg | DELAYED_RELEASE_TABLET | Freq: Two times a day (BID) | ORAL | 0 refills | Status: DC
  Filled 2022-01-03 – 2022-01-25 (×2): qty 60, 30d supply, fill #0

## 2022-01-03 NOTE — Discharge Instructions (Addendum)
You are seen in the ER for alcohol use disorder. Currently you are not having severe symptoms.  We are starting you on medications that should help blunt the alcohol withdrawal.  We suspect that your abdominal discomfort is likely because of your severe gastritis.  Please follow-up with your GI doctor by calling the number provided to set up an appointment with them.  Please utilize the resources below for alcohol use disorder.  Behavioral health urgent care also can be utilized for counseling purposes. Return to the ER if your symptoms get worse.  Please read the instructions on alcohol withdrawal.  Substance Abuse Treatment Programs  Intensive Outpatient Programs [facility]     [address]      [place], [place]                   [phone]       [facility] [address] [place], [place] [phone]  [facility]     (Inpatient and outpatient)     [address]           [phone]    [facility] [phone] (Suboxone and Methadone)  [address]      [place], [place] [address]      [phone]       [address] [place], [place] [phone]  [facility] (Outpatient/Inpatient, [employer])    (insurance only) [phone]             [facility] (Groups & Residential) [place], [place] [phone]     [facility]     [address]     [place], [place]      [phone]       [facility] (for caregivers and family) [phone] [place], [place] [phone]      Residential Treatment Programs [facility]      [address], [place], [place] [address]  [phone]       [facility] [address], [place], [place] [address] [phone]  [facility] [place]        [phone]       [facility] [phone]  [facility] ([facility]             [address]                                          [place], [place]                                                [phone] or [phone]                               [facility] [place] [address] [place], [address] [phone]  [facility]    [address]      [place], [place]     [phone]       [facility] [address] [place], [place] [phone]  [facility]   [address] [place], [place] [address]     [phone]  Admissions: 8am-3pm M-F  [facility] ([facility]) [address] [place], [place] [phone]  [facility]: Residential Program ([address]   [place], [place]      [phone] or [phone]     [facility]: [facility] [place], [place] (Walk in Hours over the weekend or by referral)  [facility] [address] [place], [place], [place] [address] [phone]  Crisis Mobile: [facility]:  [phone] (for crisis response 24 hours a day) [facility] Hotline:      [phone] Outpatient Psychiatry and Counseling  [facility]: Mobile Crisis Management 24 hours:  [phone]  [facility] of the [employer] sliding scale fee and walk in schedule: M-F 8am-12pm/1pm-3pm [address]  [place], [place] 07/16/1975 [phone]  [facility] [address] [place], [place] [address] [phone]  [facility] ([facility] [employer])- new patient walk-in appointments available Monday - Friday 8am -3pm.          [address] [place], [place] [address] [phone] or crisis line- [phone]  [facility]/ Intensive Outpatient Therapy Program [address] [place], [place] [address] [phone]  [facility]                  Crisis Services      [phone] [address]     [place], [place] [address]                  [facility]   [facility] [phone]. [address] [place], [place] [address]   [facility]          [address]  [place], [place] [address]       [phone]  [facility] [address] [place], [place] [address] [phone]  [facility]    [address]    [place], [place] [address]     [phone]       Andee Poles, MD     [address]     [place] [address]     [phone]       [facility] [address] [place] [address]  [facility]     [address] [employer], [place]      [phone]       [facility] Eulogio Ditch, MD [address] [place], [place] [address] [phone]  [facility]     [address]     [place], [place] [address]     [phone]       [facility] [address] [place], [place] [address] [phone] Resources for Temporary Residential Assistance/Crisis Centers  [facility]ENT[facility]) M-F 8am-3pm   [address] [place], [place] [address]   [phone] Services include: laundry, barbering, support groups, case management, phone  & computer access, showers, AA/NA mtgs, mental health/substance abuse nurse, job skills class, disability information, VA assistance, spiritual classes, etc.   HOMELESS SHELTERS  [facility]     [place]   [address] [place]     [phone]              [employer] (women and children)       [address] [place], [place]  [address] [phone] [email or web address] for application and process Application Required  [facility]   [address]    [place] [address]     [phone]                    [facility] [address] [place], [place] [address] [phone] application appt.) Application Required  [facility] (women only)    [address]     [place], [place] [address]     [phone]      Intake starts 6pm daily Need valid ID, SSC, & Police report [employer] [address] [place], [place] [phone] Application Required  [facility] (men only)     [address]      [place], [place]     [phone]       [facility] [place] (Pregnant women only) [address] [place], [place] [phone]  [facility]      [address]      [place], [place] [address]     [phone]             [facility] [address] [place], [place] [phone] 90 day commitment/SA/Application process  [facility](men only)     [address]     [place], [place]     [phone]       Check-in at [facility] [address] [place], [place] [address] [phone] Men/Women/Women and Children must be there by 7 pm  [facility] [place], [place] [phone]

## 2022-01-03 NOTE — ED Provider Notes (Signed)
[facility]-EMERGENCY DEPT Provider Note   CSN: [id number] Arrival date & time: 01/03/22  1020     History  Chief Complaint  Patient presents with   Alcohol Intoxication   Abdominal Pain    Patrick [name] is a 46 y.o. male.  HPI    46 year old male comes in with chief complaint of alcohol use disorder and abdominal discomfort.  Patient states that he has history of alcoholism.  He now drinks 4 large cans of beer every day.  He wants to quit, but does not know how to quit.  He often has to drink only to keep the withdrawal symptoms away.  He has not been sober since before COVID.  At that time he was clean for 2 or 3 weeks.  Patient was admitted to the hospital in 2021 when he had severe alcohol withdrawal while in the hospital while being treated for sepsis.  He is also complaining of abdominal pain.  He indicates that the abdominal pain is epigastric and constant.  It feels like inside of him is burning.  He has had pancreatitis before and states that the symptoms now are constant.  He is taking his PPI daily.  Review of system is negative for nausea, vomiting, bloody stools.  No history of detox admission, however patient wants that right now.  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   Home Medications Prior to Admission medications   Medication Sig Start Date End Date Taking? Authorizing Provider  pantoprazole (PROTONIX) 40 MG tablet Take 1 tablet (40 mg total) by mouth 2 (two) times daily before a meal. 01/03/22  Yes Tniya Bowditch, MD  amoxicillin-clavulanate (AUGMENTIN) 875-125 MG tablet Take 1 tablet by mouth 2 (two) times daily. 12/29/21   Waldon Merl, PA-C  fluticasone (FLONASE) 50 MCG/ACT nasal spray Place 2 sprays into both nostrils daily. 12/29/21   Waldon Merl, PA-C  gabapentin (NEURONTIN) 300 MG capsule TAKE 1 CAPSULE (300 MG TOTAL) BY MOUTH 3 (THREE) TIMES DAILY. 11/17/21 11/17/22  Hoy Register, MD  Glecaprevir-Pibrentasvir (MAVYRET) 100-40 MG TABS Take 3 tablets by mouth daily with breakfast. Patient taking differently: Take 3 tablets by mouth daily after supper. 12/01/19   Kuppelweiser, Cassie L, RPH-CPP  ibuprofen (ADVIL) 200 MG tablet Take 600-800 mg by mouth every 6 (six) hours as needed (for pain).    [provider]  lisinopril (ZESTRIL) 2.5 MG tablet Take 1 tablet (2.5 mg total) by mouth daily. 11/17/21   Hoy Register, MD  methocarbamol (ROBAXIN) 500 MG tablet Take 1 tablet (500 mg total) by mouth every 8 (eight) hours as needed for muscle spasms. 08/27/17   Barnetta Chapel, PA-C  Multiple Vitamin (MULTIVITAMIN PO) Take 1 tablet by mouth daily as needed ("for nutritional purposes").     [provider]  potassium chloride SA (KLOR-CON) 20 MEQ tablet Take 1 tablet (20 mEq total) by mouth daily. 07/14/20   Anders Simmonds, PA-C  potassium chloride SA (KLOR-CON) 20 MEQ tablet TAKE 1 TABLET (20 MEQ TOTAL) BY MOUTH DAILY. 01/15/20 01/14/21  Anders Simmonds, PA-C  sucralfate (CARAFATE) 1 g tablet Take 1 tablet (1 g total) by mouth 4 (four) times daily -  with meals and at bedtime. 11/17/21   Hoy Register, MD  Vitamin D, Ergocalciferol, (DRISDOL) 1.25 MG (50000 UNIT) CAPS capsule Take 1 capsule (50,000 Units total) by mouth every 7 (seven) days. 03/18/20   Anders Simmonds, PA-C  Allergies    Mushroom extract complex    Review of Systems   Review of Systems  All other systems reviewed and are negative.   Physical Exam Updated Vital Signs BP (!) 128/115 (BP Location: Right Arm)   Pulse 86   Temp 97.8 F (36.6 C) (Oral)   Resp 18   SpO2 97%  Physical Exam Vitals and nursing note reviewed.  Constitutional:      Appearance: He is well-developed.  HENT:     Head: Atraumatic.  Cardiovascular:     Rate and Rhythm: Normal rate.  Pulmonary:     Effort: Pulmonary effort is normal.  Musculoskeletal:     Cervical back: Neck supple.  Skin:     General: Skin is warm.  Neurological:     Mental Status: He is alert and oriented to person, place, and time.     ED Results / Procedures / Treatments   Labs (all labs ordered are listed, but only abnormal results are displayed) Labs Reviewed  COMPREHENSIVE METABOLIC PANEL - Abnormal; Notable for the following components:      Result Value   CO2 19 (*)    Glucose, Bld 127 (*)    Calcium 8.7 (*)    AST 46 (*)    All other components within normal limits  CBC - Abnormal; Notable for the following components:   MCH 34.5 (*)    Platelets 101 (*)    All other components within normal limits  LIPASE, BLOOD  URINALYSIS, ROUTINE W REFLEX MICROSCOPIC    EKG None  Radiology No results found.  Procedures Procedures    Medications Ordered in ED Medications  chlordiazePOXIDE (LIBRIUM) capsule 50 mg (50 mg Oral Given 01/03/22 1340)  alum & mag hydroxide-simeth (MAALOX/MYLANTA) 200-200-20 MG/5ML suspension 30 mL (30 mLs Oral Given 01/03/22 1340)    ED Course/ Medical Decision Making/ A&P                           Medical Decision Making Amount and/or Complexity of Data Reviewed Labs: ordered.  Risk OTC drugs. Prescription drug management.   This patient presents to the ED with chief complaint(s) of epigastric abdominal pain that is constant for the last several days and alcohol use disorder with pertinent past medical history of alcoholism, gastritis.The complaint involves an extensive differential diagnosis and also carries with it a high risk of complications and morbidity.    The differential diagnosis includes : Pancreatitis, Hepatobiliary pathology including cholelithiasis and cholecystitis, Gastritis/peptic ulcer disease. Currently, patient is likely having mild alcohol withdrawal.  Patient had his last alcoholic beverage earlier today.  The initial plan is to get basic labs only.  His abdominal exam is reassuring.  CT scan is not indicated at this time as we doubt that  he has severe pancreatitis or complication from it.   Additional history obtained: Records reviewed reviewed patient's last mission.  Patient was admitted for sepsis and had alcohol withdrawal at that time.  This was in 2021.  In 2020 he had left without being seen.  Patient had 2 CT scans within the last 3 years, neither one of them showing pancreatitis.  Independent labs interpretation:  The following labs were independently interpreted: CBC shows normal hemoglobin.  Patient's lipase is normal.  LFTs are reassuring.  Treatment and Reassessment: Patient will be given Librium right now along with GI cocktail.  Patient will need outpatient PCP follow-up, GI follow-up. We will also discussed the outpatient  resources for alcohol use disorder.  We will start him on Librium.  Behavioral health urgent care information provided.   Final Clinical Impression(s) / ED Diagnoses Final diagnoses:  Alcohol use disorder  PUD (peptic ulcer disease)    Rx / DC Orders ED Discharge Orders          Ordered    pantoprazole (PROTONIX) 40 MG tablet  2 times daily before meals        01/03/22 1337              Derwood Kaplan, MD 01/03/22 1343

## 2022-01-03 NOTE — ED Triage Notes (Addendum)
Pt presents via EMS from home with c/o ETOH. Pt reports to EMS that he "cannot stop drinking and he feels like dying". Pt reports his last drink was today. Pt reports all over pain. Pt also reports a hx of pancreatitis. Pt says he believes have may have accidentally taken 2 doses of his daily medications today. Pt reports he is on gabapentin, an antibiotic for a toothache, blood pressure medication, and a medication for his stomach.

## 2022-01-04 ENCOUNTER — Other Ambulatory Visit: Payer: Self-pay

## 2022-01-10 ENCOUNTER — Other Ambulatory Visit: Payer: Self-pay

## 2022-01-11 ENCOUNTER — Inpatient Hospital Stay (HOSPITAL_COMMUNITY)
Admission: EM | Admit: 2022-01-11 | Discharge: 2022-01-12 | DRG: 439 | Discharge status: Against Medical Advice | Payer: Self-pay | Attending: Internal Medicine | Admitting: Internal Medicine

## 2022-01-11 ENCOUNTER — Encounter (HOSPITAL_COMMUNITY): Payer: Self-pay

## 2022-01-11 ENCOUNTER — Other Ambulatory Visit: Payer: Self-pay

## 2022-01-11 ENCOUNTER — Emergency Department (HOSPITAL_COMMUNITY): Payer: Self-pay

## 2022-01-11 DIAGNOSIS — E876 Hypokalemia: Secondary | ICD-10-CM | POA: Diagnosis present

## 2022-01-11 DIAGNOSIS — Z597 Insufficient social insurance and welfare support: Secondary | ICD-10-CM

## 2022-01-11 DIAGNOSIS — K7030 Alcoholic cirrhosis of liver without ascites: Secondary | ICD-10-CM | POA: Diagnosis present

## 2022-01-11 DIAGNOSIS — F1020 Alcohol dependence, uncomplicated: Secondary | ICD-10-CM | POA: Diagnosis present

## 2022-01-11 DIAGNOSIS — D696 Thrombocytopenia, unspecified: Secondary | ICD-10-CM | POA: Diagnosis present

## 2022-01-11 DIAGNOSIS — K859 Acute pancreatitis without necrosis or infection, unspecified: Secondary | ICD-10-CM | POA: Diagnosis present

## 2022-01-11 DIAGNOSIS — K766 Portal hypertension: Secondary | ICD-10-CM | POA: Diagnosis present

## 2022-01-11 DIAGNOSIS — Z809 Family history of malignant neoplasm, unspecified: Secondary | ICD-10-CM

## 2022-01-11 DIAGNOSIS — K852 Alcohol induced acute pancreatitis without necrosis or infection: Principal | ICD-10-CM | POA: Diagnosis present

## 2022-01-11 DIAGNOSIS — I10 Essential (primary) hypertension: Secondary | ICD-10-CM | POA: Diagnosis present

## 2022-01-11 DIAGNOSIS — I8500 Esophageal varices without bleeding: Secondary | ICD-10-CM | POA: Diagnosis present

## 2022-01-11 DIAGNOSIS — I8510 Secondary esophageal varices without bleeding: Secondary | ICD-10-CM | POA: Diagnosis present

## 2022-01-11 DIAGNOSIS — D6959 Other secondary thrombocytopenia: Secondary | ICD-10-CM | POA: Diagnosis present

## 2022-01-11 DIAGNOSIS — B182 Chronic viral hepatitis C: Secondary | ICD-10-CM | POA: Diagnosis present

## 2022-01-11 DIAGNOSIS — F17210 Nicotine dependence, cigarettes, uncomplicated: Secondary | ICD-10-CM | POA: Diagnosis present

## 2022-01-11 DIAGNOSIS — Z79899 Other long term (current) drug therapy: Secondary | ICD-10-CM

## 2022-01-11 LAB — CBC WITH DIFFERENTIAL/PLATELET
Abs Immature Granulocytes: 0.03 K/uL (ref 0.00–0.07)
Basophils Absolute: 0 K/uL (ref 0.0–0.1)
Basophils Relative: 0 %
Eosinophils Absolute: 0 K/uL (ref 0.0–0.5)
Eosinophils Relative: 0 %
HCT: 46 % (ref 39.0–52.0)
Hemoglobin: 16.4 g/dL (ref 13.0–17.0)
Immature Granulocytes: 0 %
Lymphocytes Relative: 13 %
Lymphs Abs: 1.3 K/uL (ref 0.7–4.0)
MCH: 34.5 pg — ABNORMAL HIGH (ref 26.0–34.0)
MCHC: 35.7 g/dL (ref 30.0–36.0)
MCV: 96.8 fL (ref 80.0–100.0)
Monocytes Absolute: 0.6 K/uL (ref 0.1–1.0)
Monocytes Relative: 6 %
Neutro Abs: 8 K/uL — ABNORMAL HIGH (ref 1.7–7.7)
Neutrophils Relative %: 81 %
Platelets: 83 K/uL — ABNORMAL LOW (ref 150–400)
RBC: 4.75 MIL/uL (ref 4.22–5.81)
RDW: 13.2 % (ref 11.5–15.5)
WBC: 9.9 K/uL (ref 4.0–10.5)
nRBC: 0 % (ref 0.0–0.2)

## 2022-01-11 LAB — COMPREHENSIVE METABOLIC PANEL WITH GFR
ALT: 48 U/L — ABNORMAL HIGH (ref 0–44)
AST: 68 U/L — ABNORMAL HIGH (ref 15–41)
Albumin: 4.7 g/dL (ref 3.5–5.0)
Alkaline Phosphatase: 69 U/L (ref 38–126)
Anion gap: 14 (ref 5–15)
BUN: 13 mg/dL (ref 6–20)
CO2: 21 mmol/L — ABNORMAL LOW (ref 22–32)
Calcium: 9.1 mg/dL (ref 8.9–10.3)
Chloride: 102 mmol/L (ref 98–111)
Creatinine, Ser: 1.16 mg/dL (ref 0.61–1.24)
GFR, Estimated: >60 mL/min (ref >60)
Glucose, Bld: 123 mg/dL — ABNORMAL HIGH (ref 70–99)
Potassium: 3.2 mmol/L — ABNORMAL LOW (ref 3.5–5.1)
Sodium: 137 mmol/L (ref 135–145)
Total Bilirubin: 0.8 mg/dL (ref 0.3–1.2)
Total Protein: 8.6 g/dL — ABNORMAL HIGH (ref 6.5–8.1)

## 2022-01-11 LAB — MAGNESIUM: Magnesium: 1.5 mg/dL — ABNORMAL LOW (ref 1.7–2.4)

## 2022-01-11 LAB — LIPASE, BLOOD: Lipase: 383 U/L — ABNORMAL HIGH (ref 11–51)

## 2022-01-11 LAB — HIV ANTIBODY (ROUTINE TESTING W REFLEX): HIV Screen 4th Generation wRfx: NONREACTIVE

## 2022-01-11 MED ORDER — MORPHINE SULFATE (PF) 4 MG/ML IV SOLN
4.0000 mg | Freq: Once | INTRAVENOUS | Status: AC
  Administered 2022-01-11: 4 mg via INTRAVENOUS
  Filled 2022-01-11: qty 1

## 2022-01-11 MED ORDER — ACETAMINOPHEN 650 MG RE SUPP: 650.0000 mg | Freq: Four times a day (QID) | RECTAL | Status: DC | PRN

## 2022-01-11 MED ORDER — LORAZEPAM 1 MG PO TABS
1.0000 mg | ORAL_TABLET | ORAL | Status: DC | PRN
  Administered 2022-01-12: 1 mg via ORAL
  Administered 2022-01-12: 2 mg via ORAL
  Administered 2022-01-12 (×2): 1 mg via ORAL
  Filled 2022-01-11: qty 2
  Filled 2022-01-11 (×2): qty 1
  Filled 2022-01-11: qty 2

## 2022-01-11 MED ORDER — POTASSIUM CHLORIDE 10 MEQ/100ML IV SOLN
10.0000 meq | INTRAVENOUS | Status: AC
  Administered 2022-01-11 – 2022-01-12 (×3): 10 meq via INTRAVENOUS
  Filled 2022-01-11 (×3): qty 100

## 2022-01-11 MED ORDER — FOLIC ACID 1 MG PO TABS
1.0000 mg | ORAL_TABLET | Freq: Every day | ORAL | Status: DC
  Administered 2022-01-11: 1 mg via ORAL
  Filled 2022-01-11 (×3): qty 1

## 2022-01-11 MED ORDER — IOHEXOL 300 MG/ML  SOLN
100.0000 mL | Freq: Once | INTRAMUSCULAR | Status: AC | PRN
  Administered 2022-01-11: 100 mL via INTRAVENOUS

## 2022-01-11 MED ORDER — LACTATED RINGERS IV SOLN: INTRAVENOUS | Status: DC

## 2022-01-11 MED ORDER — ONDANSETRON HCL 4 MG/2ML IJ SOLN
4.0000 mg | Freq: Four times a day (QID) | INTRAMUSCULAR | Status: DC | PRN
  Administered 2022-01-11 – 2022-01-12 (×2): 4 mg via INTRAVENOUS
  Filled 2022-01-11 (×2): qty 2

## 2022-01-11 MED ORDER — LACTATED RINGERS IV BOLUS
1000.0000 mL | Freq: Once | INTRAVENOUS | Status: AC
  Administered 2022-01-11: 1000 mL via INTRAVENOUS

## 2022-01-11 MED ORDER — METOCLOPRAMIDE HCL 5 MG/ML IJ SOLN
10.0000 mg | Freq: Once | INTRAMUSCULAR | Status: AC
  Administered 2022-01-11: 10 mg via INTRAVENOUS
  Filled 2022-01-11: qty 2

## 2022-01-11 MED ORDER — ONDANSETRON HCL 4 MG PO TABS: 4.0000 mg | ORAL_TABLET | Freq: Four times a day (QID) | ORAL | Status: DC | PRN

## 2022-01-11 MED ORDER — ONDANSETRON HCL 4 MG/2ML IJ SOLN
4.0000 mg | Freq: Once | INTRAMUSCULAR | Status: AC
  Administered 2022-01-11: 4 mg via INTRAVENOUS
  Filled 2022-01-11: qty 2

## 2022-01-11 MED ORDER — HYDROMORPHONE HCL 1 MG/ML IJ SOLN
1.0000 mg | Freq: Once | INTRAMUSCULAR | Status: AC
  Administered 2022-01-11: 1 mg via INTRAVENOUS
  Filled 2022-01-11: qty 1

## 2022-01-11 MED ORDER — ADULT MULTIVITAMIN W/MINERALS CH
1.0000 | ORAL_TABLET | Freq: Every day | ORAL | Status: DC
  Administered 2022-01-11: 1 via ORAL
  Filled 2022-01-11 (×2): qty 1

## 2022-01-11 MED ORDER — HYDROMORPHONE HCL 1 MG/ML IJ SOLN
0.5000 mg | INTRAMUSCULAR | Status: DC | PRN
  Administered 2022-01-12 (×3): 1 mg via INTRAVENOUS
  Filled 2022-01-11 (×3): qty 1

## 2022-01-11 MED ORDER — LORAZEPAM 2 MG/ML IJ SOLN
1.0000 mg | INTRAMUSCULAR | Status: DC | PRN
  Administered 2022-01-11 – 2022-01-12 (×5): 2 mg via INTRAVENOUS
  Filled 2022-01-11 (×6): qty 1

## 2022-01-11 MED ORDER — HYDROMORPHONE HCL 1 MG/ML IJ SOLN
0.5000 mg | Freq: Once | INTRAMUSCULAR | Status: AC
  Administered 2022-01-11: 0.5 mg via INTRAVENOUS
  Filled 2022-01-11: qty 1

## 2022-01-11 MED ORDER — ACETAMINOPHEN 325 MG PO TABS: 650.0000 mg | ORAL_TABLET | Freq: Four times a day (QID) | ORAL | Status: DC | PRN

## 2022-01-11 MED ORDER — THIAMINE HCL 100 MG/ML IJ SOLN
100.0000 mg | Freq: Every day | INTRAMUSCULAR | Status: DC
  Administered 2022-01-12: 100 mg via INTRAVENOUS
  Filled 2022-01-11: qty 2

## 2022-01-11 MED ORDER — THIAMINE MONONITRATE 100 MG PO TABS
100.0000 mg | ORAL_TABLET | Freq: Every day | ORAL | Status: DC
  Administered 2022-01-11: 100 mg via ORAL
  Filled 2022-01-11: qty 1

## 2022-01-11 NOTE — Assessment & Plan Note (Addendum)
-   due to ongoing alcohol use; patient reports ~3 drinks daily however gets tremors if does not drink he states - has appetite; GI has advanced to [facility] - continue IVF for now - pain and antiemetics as needed

## 2022-01-11 NOTE — Assessment & Plan Note (Addendum)
-   Seen by Dr. Ninetta Lights previously in 2021; appears has been treated with Mavyret x 8 weeks (initiated 11/27/19) (F3, Genotype 1b) - Follow-up HCV RNA

## 2022-01-11 NOTE — Assessment & Plan Note (Signed)
Presumably secondary to EtOH abuse + cirrhosis with portal HTN as demonstrated on CT.

## 2022-01-11 NOTE — Assessment & Plan Note (Addendum)
-   continue CIWA protocol

## 2022-01-11 NOTE — H&P (Signed)
History and Physical    Patient: Patrick [name] NUU:[id number] DOB: [birth year] DOA: 01/11/2022 DOS: the patient was seen and examined on 01/11/2022 PCP: Patient, No Pcp Per  Patient coming from: Home  Chief Complaint:  Chief Complaint  Patient presents with   Abdominal Pain   HPI: Patrick [name] is a 46 y.o. male with medical history significant of HTN, HCV, EtOH abuse (ongoing).  Pt presents to ED with c/o N/V/D and abd pain.  Patient reports he initially got diarrhea 3 days ago and had numerous episodes and then yesterday started having nausea and vomiting.  He has vomited more than 10 times yesterday and today in reports that he has diffuse gnawing abdominal pain throughout his abdomen.    Review of Systems: As mentioned in the history of present illness. All other systems reviewed and are negative. Past Medical History:  Diagnosis Date   Eczema    Hypertension    Migraine headache    Past Surgical History:  Procedure Laterality Date   BIOPSY  09/04/2019   Procedure: BIOPSY;  Surgeon: Jake Bathe, MD;  Location: [facility] ENDOSCOPY;  Service: Cardiovascular;;   ESOPHAGOGASTRODUODENOSCOPY (EGD) WITH PROPOFOL  09/04/2019   Procedure: ESOPHAGOGASTRODUODENOSCOPY (EGD) WITH PROPOFOL;  Surgeon: Jake Bathe, MD;  Location: [facility] ENDOSCOPY;  Service: Cardiovascular;;   TEE WITHOUT CARDIOVERSION N/A 09/04/2019   Procedure: TRANSESOPHAGEAL ECHOCARDIOGRAM (TEE);  Surgeon: Jake Bathe, MD;  Location: [facility] ENDOSCOPY;  Service: Cardiovascular;  Laterality: N/A;   Social History:  reports that he has been smoking cigarettes. He has been smoking an average of 1 pack per day. He has never used smokeless tobacco. He reports current alcohol use. He reports current drug use. Drug: Marijuana.  Allergies  Allergen Reactions   Mushroom Extract Complex Diarrhea, Itching and Other (See Comments)    Itchy throat, also Pt states his throat swells shut also    Family History  Problem Relation Age of Onset    Cancer Mother     Prior to Admission medications   Medication Sig Start Date End Date Taking? Authorizing Provider  amoxicillin-clavulanate (AUGMENTIN) 875-125 MG tablet Take 1 tablet by mouth 2 (two) times daily. 12/29/21   Waldon Merl, PA-C  fluticasone (FLONASE) 50 MCG/ACT nasal spray Place 2 sprays into both nostrils daily. 12/29/21   Waldon Merl, PA-C  gabapentin (NEURONTIN) 300 MG capsule TAKE 1 CAPSULE (300 MG TOTAL) BY MOUTH 3 (THREE) TIMES DAILY. 11/17/21 11/17/22  Hoy Register, MD  Glecaprevir-Pibrentasvir (MAVYRET) 100-40 MG TABS Take 3 tablets by mouth daily with breakfast. Patient taking differently: Take 3 tablets by mouth daily after supper. 12/01/19   Kuppelweiser, Cassie L, RPH-CPP  ibuprofen (ADVIL) 200 MG tablet Take 600-800 mg by mouth every 6 (six) hours as needed (for pain).    [provider]  lisinopril (ZESTRIL) 2.5 MG tablet Take 1 tablet (2.5 mg total) by mouth daily. 11/17/21   Hoy Register, MD  methocarbamol (ROBAXIN) 500 MG tablet Take 1 tablet (500 mg total) by mouth every 8 (eight) hours as needed for muscle spasms. 08/27/17   Barnetta Chapel, PA-C  Multiple Vitamin (MULTIVITAMIN PO) Take 1 tablet by mouth daily as needed ("for nutritional purposes").     [provider]  pantoprazole (PROTONIX) 40 MG tablet Take 1 tablet (40 mg total) by mouth 2 (two) times daily before a meal. 01/03/22   Derwood Kaplan, MD  potassium chloride SA (KLOR-CON) 20 MEQ tablet Take 1 tablet (20 mEq total) by mouth daily. 07/14/20  Georgian Co M, PA-C  potassium chloride SA (KLOR-CON) 20 MEQ tablet TAKE 1 TABLET (20 MEQ TOTAL) BY MOUTH DAILY. 01/15/20 01/14/21  Anders Simmonds, PA-C  sucralfate (CARAFATE) 1 g tablet Take 1 tablet (1 g total) by mouth 4 (four) times daily -  with meals and at bedtime. 11/17/21   Hoy Register, MD  Vitamin D, Ergocalciferol, (DRISDOL) 1.25 MG (50000 UNIT) CAPS capsule Take 1 capsule (50,000 Units total) by mouth every 7 (seven)  days. 03/18/20   Anders Simmonds, PA-C    Physical Exam: Vitals:   01/11/22 1900 01/11/22 1930 01/11/22 1940 01/11/22 1947  BP: (!) 137/93  (!) 142/93   Pulse: 88 83 91   Resp: 15 13 14    Temp:    98.7 F (37.1 C)  TempSrc:    Oral  SpO2: (!) 89% 92% 92%   Weight:      Height:       Constitutional: NAD, calm, comfortable Eyes: PERRL, lids and conjunctivae normal ENMT: Mucous membranes are moist. Posterior pharynx clear of any exudate or lesions.Normal dentition.  Neck: normal, supple, no masses, no thyromegaly Respiratory: clear to auscultation bilaterally, no wheezing, no crackles. Normal respiratory effort. No accessory muscle use.  Cardiovascular: Regular rate and rhythm, no murmurs / rubs / gallops. No extremity edema. 2+ pedal pulses. No carotid bruits.  Abdomen: Epigastric TTP Musculoskeletal: no clubbing / cyanosis. No joint deformity upper and lower extremities. Good ROM, no contractures. Normal muscle tone.  Skin: no rashes, lesions, ulcers. No induration Neurologic: CN 2-12 grossly intact. Sensation intact, DTR normal. Strength 5/5 in all 4.  Psychiatric: Normal judgment and insight. Alert and oriented x 3. Normal mood.   Data Reviewed:    CBC    Component Value Date/Time   WBC 9.9 01/11/2022 1648   RBC 4.75 01/11/2022 1648   HGB 16.4 01/11/2022 1648   HGB 14.0 11/17/2021 1124   HCT 46.0 01/11/2022 1648   HCT 41.1 11/17/2021 1124   PLT 83 (L) 01/11/2022 1648   PLT 103 (L) 11/17/2021 1124   MCV 96.8 01/11/2022 1648   MCV 98 (H) 11/17/2021 1124   MCH 34.5 (H) 01/11/2022 1648   MCHC 35.7 01/11/2022 1648   RDW 13.2 01/11/2022 1648   RDW 12.5 11/17/2021 1124   LYMPHSABS 1.3 01/11/2022 1648   LYMPHSABS 1.9 11/17/2021 1124   MONOABS 0.6 01/11/2022 1648   EOSABS 0.0 01/11/2022 1648   EOSABS 0.2 11/17/2021 1124   BASOSABS 0.0 01/11/2022 1648   BASOSABS 0.1 11/17/2021 1124   CMP     Component Value Date/Time   NA 137 01/11/2022 1648   NA 141 11/17/2021  1124   K 3.2 (L) 01/11/2022 1648   CL 102 01/11/2022 1648   CO2 21 (L) 01/11/2022 1648   GLUCOSE 123 (H) 01/11/2022 1648   BUN 13 01/11/2022 1648   BUN 13 11/17/2021 1124   CREATININE 1.16 01/11/2022 1648   CREATININE 1.11 12/29/2019 1524   CALCIUM 9.1 01/11/2022 1648   PROT 8.6 (H) 01/11/2022 1648   PROT 7.0 11/17/2021 1124   ALBUMIN 4.7 01/11/2022 1648   ALBUMIN 4.3 11/17/2021 1124   AST 68 (H) 01/11/2022 1648   ALT 48 (H) 01/11/2022 1648   ALT 21 09/16/2019 1427   ALKPHOS 69 01/11/2022 1648   BILITOT 0.8 01/11/2022 1648   BILITOT 0.3 11/17/2021 1124   GFRNONAA >60 01/11/2022 1648   GFRAA 90 03/17/2020 1554   Lipase: 383  CT AP: IMPRESSION: 1. CT findings consistent  with acute uncomplicated pancreatitis, mainly involving pancreatic head. 2. Progressive changes of cirrhosis and portal venous hypertension with portal venous collaterals, paraesophageal varices and splenomegaly. 3. Small low-attenuation lesion in the pancreatic head could be a small pseudocyst. Attention on follow-up is suggested. 4. Age advanced atherosclerotic calcification involving the aorta and iliac arteries.  Assessment and Plan: * Acute alcoholic pancreatitis NPO IVF Dilaudid PRN pain zofran PRN nausea Appears uncomplicated (from pancreatitis perspective) on CT today. Replace K Repeat labs in AM  Esophageal varices without bleeding (HCC) Seen on CT scan today. Doesn't seem to be bleeding at this point; however, presumably he needs EGD in near future: Putting in for GI consult for AM. Looks like he doesn't have insurance, hopefully they might be able to do that during this admit?  Thrombocytopenia (HCC) Presumably secondary to EtOH abuse + cirrhosis with portal HTN as demonstrated on CT.  Alcoholic cirrhosis of liver without ascites (HCC) With paraesophageal varices demonstrated on CT.  Alcohol use disorder, severe, dependence (HCC) CIWA  Chronic hepatitis C without hepatic coma  (HCC) Looks like this might have gotten treated back in 2021?  Undetectable RNA in Oct 2021 after high RNA a few months prior to that. Will repeat HCV RNA quant      Advance Care Planning:   Code Status: Full Code  Consults: Dr. Marca Ancona  Family Communication: No family in room  Severity of Illness: The appropriate patient status for this patient is OBSERVATION. Observation status is judged to be reasonable and necessary in order to provide the required intensity of service to ensure the patient's safety. The patient's presenting symptoms, physical exam findings, and initial radiographic and laboratory data in the context of their medical condition is felt to place them at decreased risk for further clinical deterioration. Furthermore, it is anticipated that the patient will be medically stable for discharge from the hospital within 2 midnights of admission.   Author: Hillary Bow., DO 01/11/2022 8:05 PM  For on call review [email or web address].

## 2022-01-11 NOTE — ED Provider Notes (Signed)
[facility]-EMERGENCY DEPT Provider Note   CSN: [id number] Arrival date & time: 01/11/22  1542     History  Chief Complaint  Patient presents with   Abdominal Pain    Patrick [name] is a 46 y.o. male.  Patient is a 46 year old male with a history of hypertension, migraine headaches, daily alcohol use who is presenting today with complaints of nausea vomiting abdominal pain and diarrhea.  Patient reports he initially got diarrhea 3 days ago and had numerous episodes and then yesterday started having nausea and vomiting.  He has vomited more than 10 times yesterday and today in reports that he has diffuse gnawing abdominal pain throughout his abdomen.  He has not been able to hold down any medication and has not had any alcohol in 2 days.  He has felt chilled with vomiting but denies any known fever.  He has decreased urine output but denies any dysuria.  He has no known food exposure or sick contacts.  No prior abdominal surgeries.  He does normally take omeprazole but has not been able to keep it down and tried to take a [place] powder today which did not help with the pain.  The history is provided by the patient and medical records.  Abdominal Pain      Home Medications Prior to Admission medications   Medication Sig Start Date End Date Taking? Authorizing Provider  amoxicillin-clavulanate (AUGMENTIN) 875-125 MG tablet Take 1 tablet by mouth 2 (two) times daily. 12/29/21   Waldon Merl, PA-C  fluticasone (FLONASE) 50 MCG/ACT nasal spray Place 2 sprays into both nostrils daily. 12/29/21   Waldon Merl, PA-C  gabapentin (NEURONTIN) 300 MG capsule TAKE 1 CAPSULE (300 MG TOTAL) BY MOUTH 3 (THREE) TIMES DAILY. 11/17/21 11/17/22  Hoy Register, MD  Glecaprevir-Pibrentasvir (MAVYRET) 100-40 MG TABS Take 3 tablets by mouth daily with breakfast. Patient taking differently: Take 3 tablets by mouth daily after supper. 12/01/19   Kuppelweiser, Cassie L, RPH-CPP  ibuprofen  (ADVIL) 200 MG tablet Take 600-800 mg by mouth every 6 (six) hours as needed (for pain).    [provider]  lisinopril (ZESTRIL) 2.5 MG tablet Take 1 tablet (2.5 mg total) by mouth daily. 11/17/21   Hoy Register, MD  methocarbamol (ROBAXIN) 500 MG tablet Take 1 tablet (500 mg total) by mouth every 8 (eight) hours as needed for muscle spasms. 08/27/17   Barnetta Chapel, PA-C  Multiple Vitamin (MULTIVITAMIN PO) Take 1 tablet by mouth daily as needed ("for nutritional purposes").     [provider]  pantoprazole (PROTONIX) 40 MG tablet Take 1 tablet (40 mg total) by mouth 2 (two) times daily before a meal. 01/03/22   Derwood Kaplan, MD  potassium chloride SA (KLOR-CON) 20 MEQ tablet Take 1 tablet (20 mEq total) by mouth daily. 07/14/20   Anders Simmonds, PA-C  potassium chloride SA (KLOR-CON) 20 MEQ tablet TAKE 1 TABLET (20 MEQ TOTAL) BY MOUTH DAILY. 01/15/20 01/14/21  Anders Simmonds, PA-C  sucralfate (CARAFATE) 1 g tablet Take 1 tablet (1 g total) by mouth 4 (four) times daily -  with meals and at bedtime. 11/17/21   Hoy Register, MD  Vitamin D, Ergocalciferol, (DRISDOL) 1.25 MG (50000 UNIT) CAPS capsule Take 1 capsule (50,000 Units total) by mouth every 7 (seven) days. 03/18/20   Anders Simmonds, PA-C      Allergies    Mushroom extract complex    Review of Systems   Review of Systems  Gastrointestinal:  Positive for abdominal pain.    Physical Exam Updated Vital Signs BP 119/72   Pulse 89   Temp 98.3 F (36.8 C) (Oral)   Resp 18   Ht 5\' 11"  (1.803 m)   Wt 85.5 kg   SpO2 92%   BMI 26.29 kg/m  Physical Exam Vitals and nursing note reviewed.  Constitutional:      General: He is not in acute distress.    Appearance: He is well-developed.  HENT:     Head: Normocephalic and atraumatic.     Mouth/Throat:     Mouth: Mucous membranes are dry.  Eyes:     Conjunctiva/sclera: Conjunctivae normal.     Pupils: Pupils are equal, round, and reactive to light.   Cardiovascular:     Rate and Rhythm: Regular rhythm. Tachycardia present.     Heart sounds: No murmur heard. Pulmonary:     Effort: Pulmonary effort is normal. No respiratory distress.     Breath sounds: Normal breath sounds. No wheezing or rales.  Abdominal:     General: There is no distension.     Palpations: Abdomen is soft.     Tenderness: There is generalized abdominal tenderness. There is guarding. There is no rebound.  Musculoskeletal:        General: No tenderness. Normal range of motion.     Cervical back: Normal range of motion and neck supple.     Right lower leg: No edema.     Left lower leg: No edema.  Skin:    General: Skin is warm and dry.     Findings: No erythema or rash.  Neurological:     Mental Status: He is alert and oriented to person, place, and time. Mental status is at baseline.  Psychiatric:        Behavior: Behavior normal.     ED Results / Procedures / Treatments   Labs (all labs ordered are listed, but only abnormal results are displayed) Labs Reviewed  CBC WITH DIFFERENTIAL/PLATELET - Abnormal; Notable for the following components:      Result Value   MCH 34.5 (*)    All other components within normal limits  COMPREHENSIVE METABOLIC PANEL - Abnormal; Notable for the following components:   Potassium 3.2 (*)    CO2 21 (*)    Glucose, Bld 123 (*)    Total Protein 8.6 (*)    AST 68 (*)    ALT 48 (*)    All other components within normal limits  LIPASE, BLOOD - Abnormal; Notable for the following components:   Lipase 383 (*)    All other components within normal limits    EKG EKG Interpretation  Date/Time:  Wednesday January 11 2022 16:54:34 EDT Ventricular Rate:  78 PR Interval:  160 QRS Duration: 99 QT Interval:  399 QTC Calculation: 455 R Axis:   64 Text Interpretation: Sinus rhythm Normal ECG Confirmed by [id number] (Gwyneth Sprout) on 01/11/2022 5:22:44 PM  Radiology CT ABDOMEN PELVIS W CONTRAST  Result Date:  01/11/2022 CLINICAL DATA:  Abdominal pain vomiting since last evening. EXAM: CT ABDOMEN AND PELVIS WITH CONTRAST TECHNIQUE: Multidetector CT imaging of the abdomen and pelvis was performed using the standard protocol following bolus administration of intravenous contrast. RADIATION DOSE REDUCTION: This exam was performed according to the departmental dose-optimization program which includes automated exposure control, adjustment of the mA and/or kV according to patient size and/or use of iterative reconstruction technique. CONTRAST:  01/13/2022 OMNIPAQUE IOHEXOL 300 MG/ML  SOLN COMPARISON:  08/28/2018  FINDINGS: Lower chest: The lung bases are clear of acute process. No pleural effusion or pulmonary lesions. The heart is normal in size. No pericardial effusion. The distal esophagus and aorta are unremarkable. Hepatobiliary: Diffuse fatty infiltration of the liver and progressive changes of cirrhosis. Irregular hepatic contour, dilated hepatic fissures and increased caudate to right lobe ratio. No worrisome hepatic lesions to suggest hepatoma or dysplastic nodule. Changes of portal venous hypertension with portal venous collaterals and paraesophageal varices. The portal and splenic veins are patent. The gallbladder is unremarkable. No common bile duct dilatation. Pancreas: Moderate inflammatory changes in and around the pancreatic head consistent with focal pancreatitis. No obvious involvement of the body or tail region the pancreas. There is a small low-attenuation lesion in the pancreatic head which could be a small pseudocysts. I do not see this on the prior CT scan. Attention on follow-up is suggested. Normal enhancement of the pancreas without evidence for pancreatic necrosis. There is some surrounding fluid in the anterior pararenal space on the right but no hemorrhage or abscess. Associated significant inflammation involving the second and third portions of the duodenum. Spleen: Splenomegaly. No splenic lesions.  Adrenals/Urinary Tract: Adrenal glands and kidneys are unremarkable. Renal cortical scarring changes are noted. The bladder is unremarkable. Stomach/Bowel: Stomach is unremarkable. There is moderate inflammation of the second and third portions of the duodenum. The small bowel and colon are unremarkable. The terminal ileum and appendix are normal. There is diffuse scattered colonic diverticulosis. Vascular/Lymphatic: Age advanced atherosclerotic calcification involving the aorta and iliac arteries but no aneurysm or dissection. The major venous structures are patent. Upper abdominal lymph nodes typical with cirrhosis. No retroperitoneal adenopathy. Reproductive: The prostate gland and seminal vesicles are unremarkable. Other: No ascites or abdominal wall hernia. Bilateral inguinal hernias containing fat are noted. Musculoskeletal: No significant bony findings. There is a remote compression fracture of L2. IMPRESSION: 1. CT findings consistent with acute uncomplicated pancreatitis, mainly involving pancreatic head. 2. Progressive changes of cirrhosis and portal venous hypertension with portal venous collaterals, paraesophageal varices and splenomegaly. 3. Small low-attenuation lesion in the pancreatic head could be a small pseudocyst. Attention on follow-up is suggested. 4. Age advanced atherosclerotic calcification involving the aorta and iliac arteries. Aortic Atherosclerosis (ICD10-I70.0). Electronically Signed   By: Rudie Meyer M.D.   On: 01/11/2022 18:32    Procedures Procedures    Medications Ordered in ED Medications  lactated ringers infusion ( Intravenous New Bag/Given 01/11/22 1834)  HYDROmorphone (DILAUDID) injection 0.5 mg (has no administration in time range)  ondansetron (ZOFRAN) injection 4 mg (4 mg Intravenous Given 01/11/22 1648)  morphine (PF) 4 MG/ML injection 4 mg (4 mg Intravenous Given 01/11/22 1648)  lactated ringers bolus 1,000 mL (0 mLs Intravenous Stopped 01/11/22 1747)   HYDROmorphone (DILAUDID) injection 1 mg (1 mg Intravenous Given 01/11/22 1832)  metoCLOPramide (REGLAN) injection 10 mg (10 mg Intravenous Given 01/11/22 1832)  iohexol (OMNIPAQUE) 300 MG/ML solution 100 mL (100 mLs Intravenous Contrast Given 01/11/22 1812)    ED Course/ Medical Decision Making/ A&P                           Medical Decision Making Amount and/or Complexity of Data Reviewed External Data Reviewed: notes. Labs: ordered. Decision-making details documented in ED Course. Radiology: ordered and independent interpretation performed. Decision-making details documented in ED Course. ECG/medicine tests: ordered and independent interpretation performed. Decision-making details documented in ED Course.  Risk Prescription drug management. Decision regarding hospitalization.  Pt with multiple medical problems and comorbidities and presenting today with a complaint that caries a high risk for morbidity and mortality.  Today with complaints of ongoing abdominal pain nausea and vomiting.  Patient has diffuse abdominal pain on exam and mild tachycardia but no hypotension.  Concern for gastroenteritis versus obstruction versus PUD versus pancreatitis versus hepatitis versus alcohol withdrawal versus dehydration and AKI.  Low suspicion for acute cardiac or respiratory cause.  Patient's mental status is normal.  We will give IV fluids, pain and nausea medication.  Will reassess.  Patient denies any hematemesis and does not have a history of cirrhosis.  6:58 PM I independently interpreted patient's labs and EKG.  EKG without acute findings, CBC within normal limits, CMP with minimal hypokalemia of 3.2 but otherwise normal creatinine and BUN, LFTs mildly elevated and a normal anion gap.  Lipase is elevated today at 380.  Patient continues to have abdominal pain and vomit.  We will do a CT for further evaluation.  Concern for pancreatitis.  Patient given further pain control, currently on a rate of  fluids and more antiemetics.  6:58 PM I have independently visualized and interpreted pt's images today.  CT of the abdomen pelvis today without any evidence of hydronephrosis or renal stones.  Stranding around the pancreas.  Radiology reports CT findings are consistent with acute uncomplicated pancreatitis mainly involving the pancreatic head as well as changes consistent with cirrhosis and portal vein hypertension with portal venous collaterals and paraesophageal varices and splenomegaly.  There is a small low-attenuation lesion in the pancreatic head which could be a small pseudocyst.  On repeat evaluation patient is still having abdominal pain and nausea.  Will admit for pancreatitis.          Final Clinical Impression(s) / ED Diagnoses Final diagnoses:  Alcohol-induced acute pancreatitis without infection or necrosis    Rx / DC Orders ED Discharge Orders     None         Gwyneth Sprout, MD 01/11/22 1858

## 2022-01-11 NOTE — ED Notes (Signed)
Pt vomiting, req med.

## 2022-01-11 NOTE — Assessment & Plan Note (Addendum)
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  Follow-up plan for repeat EGD whether inpatient versus outpatient although compliance with outpt followup is a concern

## 2022-01-11 NOTE — ED Triage Notes (Signed)
Pt BIB EMS from home c/o abd pain and vomiting since last night. Pt seen for same last week.

## 2022-01-11 NOTE — Assessment & Plan Note (Addendum)
-   CT shows progressive changes of cirrhosis and portal venous hypertension with portal venous collaterals -No edema appreciated at this time - Patient has been counseled extensively on alcohol cessation

## 2022-01-12 DIAGNOSIS — K859 Acute pancreatitis without necrosis or infection, unspecified: Secondary | ICD-10-CM | POA: Diagnosis present

## 2022-01-12 LAB — COMPREHENSIVE METABOLIC PANEL
ALT: 37 U/L (ref 0–44)
AST: 44 U/L — ABNORMAL HIGH (ref 15–41)
Albumin: 3.8 g/dL (ref 3.5–5.0)
Alkaline Phosphatase: 54 U/L (ref 38–126)
Anion gap: 9 (ref 5–15)
BUN: 11 mg/dL (ref 6–20)
CO2: 24 mmol/L (ref 22–32)
Calcium: 8.5 mg/dL — ABNORMAL LOW (ref 8.9–10.3)
Chloride: 100 mmol/L (ref 98–111)
Creatinine, Ser: 0.9 mg/dL (ref 0.61–1.24)
GFR, Estimated: >60 mL/min (ref >60)
Glucose, Bld: 121 mg/dL — ABNORMAL HIGH (ref 70–99)
Potassium: 3.6 mmol/L (ref 3.5–5.1)
Sodium: 133 mmol/L — ABNORMAL LOW (ref 135–145)
Total Bilirubin: 1.2 mg/dL (ref 0.3–1.2)
Total Protein: 7.4 g/dL (ref 6.5–8.1)

## 2022-01-12 LAB — CBC
HCT: 41.4 % (ref 39.0–52.0)
Hemoglobin: 14.3 g/dL (ref 13.0–17.0)
MCH: 34.1 pg — ABNORMAL HIGH (ref 26.0–34.0)
MCHC: 34.5 g/dL (ref 30.0–36.0)
MCV: 98.8 fL (ref 80.0–100.0)
Platelets: 57 10*3/uL — ABNORMAL LOW (ref 150–400)
RBC: 4.19 MIL/uL — ABNORMAL LOW (ref 4.22–5.81)
RDW: 13.3 % (ref 11.5–15.5)
WBC: 6.4 10*3/uL (ref 4.0–10.5)
nRBC: 0 % (ref 0.0–0.2)

## 2022-01-12 LAB — PROTIME-INR
INR: 1.2 (ref 0.8–1.2)
Prothrombin Time: 15.1 seconds (ref 11.4–15.2)

## 2022-01-12 MED ORDER — NICOTINE 14 MG/24HR TD PT24
14.0000 mg | MEDICATED_PATCH | Freq: Every day | TRANSDERMAL | Status: DC
  Administered 2022-01-12: 14 mg via TRANSDERMAL
  Filled 2022-01-12: qty 1

## 2022-01-12 NOTE — Consult Note (Signed)
Referring Provider: [facility] Primary Care Physician:  Patient, No Pcp Per Primary Gastroenterologist:  Unassigned  Reason for Consultation: Alcoholic pancreatitis, alcoholic cirrhosis, esophageal varices  HPI: Patrick [name] is a 46 y.o. male with medical history significant of HTN, HCV, EtOH abuse.   Patient presented to the ED 01/11/2022 with nausea, vomiting, diarrhea and abdominal pain for 3 days.  He initially had onset of multiple episodes of non-bloody diarrhea followed by worsening abdominal pain, with worsening epigastric gnawing abdominal pain he had emesis x10 episode prior to admission 2 episodes since admission.  Emesis is nonbloody nonbilious.  He has not been able to tolerate food since the pain began 3 days ago.  Last alcohol use was 2 days prior to admission.  Denies fevers, chills, melena, hematochezia, constipation, unintentional weight loss.  Prior to presenting to the emergency room he attempted to resolve his pain with Goody powders.   On evaluation in the ED lipase elevated at 383, no elevation in WBC, hemoglobin stable at 16.4, mild elevation of ALT and AST.  CT abdomen pelvis with contrast 01/11/2022 consistent with acute uncomplicated pancreatitis, liver changes of cirrhosis, paraesophageal varices and splenomegaly.  Patient was admitted for IV fluids and pain control.  Patient has ongoing alcohol abuse, currently drinking 2-3 beers daily denies liquor or wine.  Prior to 2020 he was drinking 2-1/2 gallons of liquor daily for many years.  Patient reports smoking half pack daily since he was 13.  Denies other drug use. Reports using ibuprofen and Goody powders as needed.  This morning he says his pain is slightly improved and he is hungry for the first time in 3 days.  No previous colonoscopy.  Last EGD during hospitalization 09/04/2019 with Dr. Marca Ancona.  EGD with erythematous mucosa throughout the stomach, erythematous duodenopathy, no esophageal varices seen.     Past Medical  History:  Diagnosis Date   Eczema    Hypertension    Migraine headache     Past Surgical History:  Procedure Laterality Date   BIOPSY  09/04/2019   Procedure: BIOPSY;  Surgeon: Jake Bathe, MD;  Location: [facility] ENDOSCOPY;  Service: Cardiovascular;;   ESOPHAGOGASTRODUODENOSCOPY (EGD) WITH PROPOFOL  09/04/2019   Procedure: ESOPHAGOGASTRODUODENOSCOPY (EGD) WITH PROPOFOL;  Surgeon: Jake Bathe, MD;  Location: [facility] ENDOSCOPY;  Service: Cardiovascular;;   TEE WITHOUT CARDIOVERSION N/A 09/04/2019   Procedure: TRANSESOPHAGEAL ECHOCARDIOGRAM (TEE);  Surgeon: Jake Bathe, MD;  Location: [facility] ENDOSCOPY;  Service: Cardiovascular;  Laterality: N/A;    Prior to Admission medications   Medication Sig Start Date End Date Taking? Authorizing Provider  fluticasone (FLONASE) 50 MCG/ACT nasal spray Place 2 sprays into both nostrils daily. 12/29/21  Yes Waldon Merl, PA-C  gabapentin (NEURONTIN) 300 MG capsule TAKE 1 CAPSULE (300 MG TOTAL) BY MOUTH 3 (THREE) TIMES DAILY. Patient taking differently: Take 300 mg by mouth 3 (three) times daily. 11/17/21 11/17/22 Yes Hoy Register, MD  ibuprofen (ADVIL) 200 MG tablet Take 200 mg by mouth every 6 (six) hours as needed for mild pain.   Yes [provider]  lisinopril (ZESTRIL) 2.5 MG tablet Take 1 tablet (2.5 mg total) by mouth daily. 11/17/21  Yes Hoy Register, MD  Multiple Vitamin (MULTIVITAMIN PO) Take 1 tablet by mouth daily.   Yes [provider]  sucralfate (CARAFATE) 1 g tablet Take 1 tablet (1 g total) by mouth 4 (four) times daily -  with meals and at bedtime. 11/17/21  Yes Hoy Register, MD  amoxicillin-clavulanate (AUGMENTIN) 875-125 MG tablet Take  1 tablet by mouth 2 (two) times daily. Patient not taking: Reported on 01/11/2022 12/29/21   Waldon Merl, PA-C  Glecaprevir-Pibrentasvir (MAVYRET) 100-40 MG TABS Take 3 tablets by mouth daily with breakfast. Patient not taking: Reported on 01/11/2022 12/01/19   Kuppelweiser, Cassie L,  RPH-CPP  methocarbamol (ROBAXIN) 500 MG tablet Take 1 tablet (500 mg total) by mouth every 8 (eight) hours as needed for muscle spasms. Patient not taking: Reported on 01/11/2022 08/27/17   Barnetta Chapel, PA-C  pantoprazole (PROTONIX) 40 MG tablet Take 1 tablet (40 mg total) by mouth 2 (two) times daily before a meal. Patient not taking: Reported on 01/11/2022 01/03/22   Derwood Kaplan, MD  potassium chloride SA (KLOR-CON) 20 MEQ tablet Take 1 tablet (20 mEq total) by mouth daily. Patient not taking: Reported on 01/11/2022 07/14/20   Anders Simmonds, PA-C  potassium chloride SA (KLOR-CON) 20 MEQ tablet TAKE 1 TABLET (20 MEQ TOTAL) BY MOUTH DAILY. 01/15/20 01/14/21  Anders Simmonds, PA-C  Vitamin D, Ergocalciferol, (DRISDOL) 1.25 MG (50000 UNIT) CAPS capsule Take 1 capsule (50,000 Units total) by mouth every 7 (seven) days. Patient not taking: Reported on 01/11/2022 03/18/20   Anders Simmonds, PA-C    Scheduled Meds:  folic acid  1 mg Oral Daily   multivitamin with minerals  1 tablet Oral Daily   thiamine  100 mg Oral Daily   Or   thiamine  100 mg Intravenous Daily   Continuous Infusions:  lactated ringers 125 mL/hr at 01/12/22 0656   PRN Meds:.acetaminophen **OR** acetaminophen, HYDROmorphone (DILAUDID) injection, LORazepam **OR** LORazepam, ondansetron **OR** ondansetron (ZOFRAN) IV  Allergies as of 01/11/2022 - Review Complete 01/11/2022  Allergen Reaction Noted   Mushroom extract complex Diarrhea, Itching, and Other (See Comments) 10/15/2011    Family History  Problem Relation Age of Onset   Cancer Mother     Social History   Socioeconomic History   Marital status: Single    Spouse name: Not on file   Number of children: Not on file   Years of education: Not on file   Highest education level: Not on file  Occupational History   Not on file  Tobacco Use   Smoking status: Every Day    Packs/day: 1.00    Types: Cigarettes   Smokeless tobacco: Never  Vaping Use   Vaping  Use: Never used  Substance and Sexual Activity   Alcohol use: Yes    Comment: every day depends on day of week as to how much   Drug use: Yes    Types: Marijuana    Comment: every day   Sexual activity: Not on file  Other Topics Concern   Not on file  Social History Narrative   Not on file   Social Determinants of Health   Financial Resource Strain: Not on file  Food Insecurity: Food Insecurity Present (01/11/2022)   Hunger Vital Sign    Worried About Running Out of Food in the Last Year: Sometimes true    Ran Out of Food in the Last Year: Sometimes true  Transportation Needs: Unmet Transportation Needs (01/11/2022)   PRAPARE - Administrator, Civil Service (Medical): Yes    Lack of Transportation (Non-Medical): Yes  Physical Activity: Not on file  Stress: Not on file  Social Connections: Not on file  Intimate Partner Violence: Not At Risk (01/11/2022)   Humiliation, Afraid, Rape, and Kick questionnaire    Fear of Current or Ex-Partner: No  Emotionally Abused: No    Physically Abused: No    Sexually Abused: No    Review of Systems: All negative except as stated above in HPI.  Physical Exam:Physical Exam Constitutional:      General: He is not in acute distress.    Appearance: He is normal weight.  HENT:     Head: Normocephalic and atraumatic.     Right Ear: External ear normal.     Left Ear: External ear normal.     Nose: Nose normal.     Mouth/Throat:     Mouth: Mucous membranes are moist.  Eyes:     General: No scleral icterus.    Pupils: Pupils are equal, round, and reactive to light.  Cardiovascular:     Rate and Rhythm: Normal rate and regular rhythm.     Pulses: Normal pulses.     Heart sounds: Normal heart sounds.  Pulmonary:     Effort: Pulmonary effort is normal.     Breath sounds: Normal breath sounds.  Abdominal:     General: Bowel sounds are normal. There is no distension.     Palpations: Abdomen is soft. There is no mass.      Tenderness: There is abdominal tenderness (epigastric). There is no guarding or rebound.     Hernia: No hernia is present.  Musculoskeletal:        General: Normal range of motion.     Cervical back: Normal range of motion and neck supple.  Skin:    General: Skin is warm and dry.     Coloration: Skin is not pale.  Neurological:     General: No focal deficit present.     Mental Status: He is alert and oriented to person, place, and time. Mental status is at baseline.  Psychiatric:        Mood and Affect: Mood normal.        Behavior: Behavior normal.     Vital signs: Vitals:   01/12/22 0800 01/12/22 0958  BP:  (!) 183/126  Pulse: 90 88  Resp:  18  Temp:  98.2 F (36.8 C)  SpO2:  96%   Last BM Date : 01/11/22    GI:  Lab Results: Recent Labs    01/11/22 1648 01/12/22 0342  WBC 9.9 6.4  HGB 16.4 14.3  HCT 46.0 41.4  PLT 83* 57*   BMET Recent Labs    01/11/22 1648 01/12/22 0342  NA 137 133*  K 3.2* 3.6  CL 102 100  CO2 21* 24  GLUCOSE 123* 121*  BUN 13 11  CREATININE 1.16 0.90  CALCIUM 9.1 8.5*   LFT Recent Labs    01/12/22 0342  PROT 7.4  ALBUMIN 3.8  AST 44*  ALT 37  ALKPHOS 54  BILITOT 1.2   PT/INR No results for input(s): "LABPROT", "INR" in the last 72 hours.   Studies/Results: CT ABDOMEN PELVIS W CONTRAST  Result Date: 01/11/2022 CLINICAL DATA:  Abdominal pain vomiting since last evening. EXAM: CT ABDOMEN AND PELVIS WITH CONTRAST TECHNIQUE: Multidetector CT imaging of the abdomen and pelvis was performed using the standard protocol following bolus administration of intravenous contrast. RADIATION DOSE REDUCTION: This exam was performed according to the departmental dose-optimization program which includes automated exposure control, adjustment of the mA and/or kV according to patient size and/or use of iterative reconstruction technique. CONTRAST:  OMNIPAQUE IOHEXOL 300 MG/ML  SOLN COMPARISON:  08/28/2018 FINDINGS: Lower chest: The lung  bases are clear of acute process.  No pleural effusion or pulmonary lesions. The heart is normal in size. No pericardial effusion. The distal esophagus and aorta are unremarkable. Hepatobiliary: Diffuse fatty infiltration of the liver and progressive changes of cirrhosis. Irregular hepatic contour, dilated hepatic fissures and increased caudate to right lobe ratio. No worrisome hepatic lesions to suggest hepatoma or dysplastic nodule. Changes of portal venous hypertension with portal venous collaterals and paraesophageal varices. The portal and splenic veins are patent. The gallbladder is unremarkable. No common bile duct dilatation. Pancreas: Moderate inflammatory changes in and around the pancreatic head consistent with focal pancreatitis. No obvious involvement of the body or tail region the pancreas. There is a small low-attenuation lesion in the pancreatic head which could be a small pseudocysts. I do not see this on the prior CT scan. Attention on follow-up is suggested. Normal enhancement of the pancreas without evidence for pancreatic necrosis. There is some surrounding fluid in the anterior pararenal space on the right but no hemorrhage or abscess. Associated significant inflammation involving the second and third portions of the duodenum. Spleen: Splenomegaly. No splenic lesions. Adrenals/Urinary Tract: Adrenal glands and kidneys are unremarkable. Renal cortical scarring changes are noted. The bladder is unremarkable. Stomach/Bowel: Stomach is unremarkable. There is moderate inflammation of the second and third portions of the duodenum. The small bowel and colon are unremarkable. The terminal ileum and appendix are normal. There is diffuse scattered colonic diverticulosis. Vascular/Lymphatic: Age advanced atherosclerotic calcification involving the aorta and iliac arteries but no aneurysm or dissection. The major venous structures are patent. Upper abdominal lymph nodes typical with cirrhosis. No  retroperitoneal adenopathy. Reproductive: The prostate gland and seminal vesicles are unremarkable. Other: No ascites or abdominal wall hernia. Bilateral inguinal hernias containing fat are noted. Musculoskeletal: No significant bony findings. There is a remote compression fracture of L2. IMPRESSION: 1. CT findings consistent with acute uncomplicated pancreatitis, mainly involving pancreatic head. 2. Progressive changes of cirrhosis and portal venous hypertension with portal venous collaterals, paraesophageal varices and splenomegaly. 3. Small low-attenuation lesion in the pancreatic head could be a small pseudocyst. Attention on follow-up is suggested. 4. Age advanced atherosclerotic calcification involving the aorta and iliac arteries. Aortic Atherosclerosis (ICD10-I70.0). Electronically Signed   By: Rudie Meyer M.D.   On: 01/11/2022 18:32    Impression: Acute pancreatitis Alcoholic cirrhosis  Esophageal varices, non bleeding  HGB 14.3 Platelets 57 AST 44 ALT 37  Alkphos 54 TBili 1.2 GFR >60   Placed order for PT/INR to calculate MELD score. Patient with acute onset epigastric pain, elevated lipase, CT scan with evidence of pancreatitis.  Likely pancreatitis alcohol induced given patient's significant history of drinking and continued alcohol abuse.   Patient with evidence of esophageal varices on CT scan.  Patient will have difficulty with follow-up appointments due to lack of insurance.  No evidence of esophageal bleeding at this time.  Hemoglobin stable at 14.3.  No elevation in BUN.  Patient denies melena or hematemesis.  Patient continues to drink alcohol at least 2-3 beers per day.    Plan: Continue pain control and antiemetics as needed Continue lactated Ringer infusion at 125 mL/h for pancreatitis. Continue folic acid, multivitamins, thiamine for alcohol abuse. Clear liquid diet and advance diet as tolerated. Patient will need EGD in the future for evaluation for banding of  esophageal varices.  Recommend improvement of pancreatitis prior to schedule EGD.  Possible EGD this weekend or as outpatient. [facility] will follow  LOS: 0 days   Emmit Alexanders  PA-C 01/12/2022, 10:38  AM  Contact #  [phone]

## 2022-01-12 NOTE — Hospital Course (Signed)
Patrick [name] is a 47 yo male with PMH ongoing etoh use, HTN, HCV who presented to the hospital with N/V/D and abdominal pain.  He had reported having diarrhea approximately 3 days prior to hospitalization and then developing nausea and vomiting.  He denied any bloody vomitus or blood elsewhere. CT A/P showed acute uncomplicated pancreatitis mainly involving pancreatic head.  Progressive changes of cirrhosis and portal venous hypertension were also noted with mention of portal venous collaterals, paraesophageal varices, and splenomegaly.  Also some concern for a possible small pseudocyst involving the pancreatic head. He was started on IV fluids, bowel rest, and admitted for pain control.  GI was also consulted on admission due to presence of paraesophageal varices.

## 2022-01-12 NOTE — TOC Progression Note (Signed)
Transition of Care [facility]) - Progression Note    Patient Details  Name: Patrick [name] MRN: [id number] Date of Birth: 04/22/1976  Transition of Care [facility]) CM/SW Contact  Geni Bers, RN Phone Number: 01/12/2022, 3:27 PM  Clinical Narrative:     Transition of Care (TOC) Screening Note   Patient Details  Name: Patrick [name] Date of Birth: May 30, 1975   Transition of Care [facility]) CM/SW Contact:    Geni Bers, RN Phone Number: 01/12/2022, 3:27 PM    Transition of Care Department [facility]) has reviewed patient and no TOC needs have been identified at this time. We will continue to monitor patient advancement through interdisciplinary progression rounds. If new patient transition needs arise, please place a TOC consult.          Expected Discharge Plan and Services                                                 Social Determinants of Health (SDOH) Interventions    Readmission Risk Interventions     No data to display

## 2022-01-12 NOTE — Progress Notes (Signed)
Progress Note    Patrick [name]   FVC:[id number]  DOB: 1975/07/14  DOA: 01/11/2022     0 PCP: Patient, No Pcp Per  Initial CC: abdominal pain, N/V  Hospital Course: Patrick [name] is a 46 yo male with PMH ongoing etoh use, HTN, HCV who presented to the hospital with N/V/D and abdominal pain.  Patrick [name] had reported having diarrhea approximately 3 days prior to hospitalization and then developing nausea and vomiting.  Patrick [name] denied any bloody vomitus or blood elsewhere. CT A/P showed acute uncomplicated pancreatitis mainly involving pancreatic head.  Progressive changes of cirrhosis and portal venous hypertension were also noted with mention of portal venous collaterals, paraesophageal varices, and splenomegaly.  Also some concern for a possible small pseudocyst involving the pancreatic head. Patrick [name] was started on IV fluids, bowel rest, and admitted for pain control.  GI was also consulted on admission due to presence of paraesophageal varices.  Interval History:  No events overnight.  Patrick [name] is cooperative and comfortable this morning.  Patrick [name] was falling asleep a little bit during conversation but awoke easily for questions.  Still having ongoing abdominal pain but is complaining of hunger as well and was wanting a diet.  Denied any further nausea or vomiting.  Assessment and Plan: * Acute alcoholic pancreatitis - due to ongoing alcohol use; patient reports ~3 drinks daily however gets tremors if does not drink Patrick [name] states - has appetite; GI has advanced to [facility] - continue IVF for now - pain and antiemetics as needed  Esophageal varices without bleeding (HCC) - Periesophageal varices noted on CT A/P on 01/11/2022; per GI, patient had EGD on 09/04/2019 with no varices noted at that time - Patient did not have any bleeding prior to admission with his retching episodes -Hemoglobin remains stable -Appreciate GI assistance inpatient.  Follow-up plan for repeat EGD whether inpatient versus outpatient although compliance with  outpt followup is a concern   Alcohol use disorder, severe, dependence (HCC) - continue CIWA protocol   Alcoholic cirrhosis of liver without ascites (HCC) - CT shows progressive changes of cirrhosis and portal venous hypertension with portal venous collaterals -No edema appreciated at this time - Patient has been counseled extensively on alcohol cessation  Chronic hepatitis C without hepatic coma (HCC) - Seen by Dr. Ninetta Lights previously in 2021; appears has been treated with Mavyret x 8 weeks (initiated 11/27/19) (F3, Genotype 1b) - Follow-up HCV RNA  Thrombocytopenia (HCC) Presumably secondary to EtOH abuse + cirrhosis with portal HTN as demonstrated on CT.   Old records reviewed in assessment of this patient  Antimicrobials:   DVT prophylaxis:  SCDs Start: 01/11/22 1945   Code Status:   Code Status: Full Code  Mobility Assessment (last 72 hours)     Mobility Assessment     Row Name 01/11/22 2230 01/11/22 2200         Does patient have an order for bedrest or is patient medically unstable No - Continue assessment No - Continue assessment      What is the highest level of mobility based on the progressive mobility assessment? Level 5 (Walks with assist in room/hall) - Balance while stepping forward/back and can walk in room with assist - Complete Level 5 (Walks with assist in room/hall) - Balance while stepping forward/back and can walk in room with assist - Complete               Barriers to discharge: none Disposition Plan:  Home 2-3 days Status is: Inpt  Objective:  Blood pressure (!) 160/118, pulse 90, temperature 98.2 F (36.8 C), temperature source Oral, resp. rate 18, height 5\' 11"  (1.803 m), weight 85.5 kg, SpO2 96 %.  Examination:  Physical Exam Constitutional:      General: Patrick [name] is not in acute distress.    Appearance: Normal appearance. Patrick [name] is well-developed.  HENT:     Head: Normocephalic and atraumatic.     Mouth/Throat:     Mouth: Mucous membranes  are moist.  Eyes:     Extraocular Movements: Extraocular movements intact.  Cardiovascular:     Rate and Rhythm: Normal rate and regular rhythm.     Heart sounds: Normal heart sounds.  Pulmonary:     Effort: Pulmonary effort is normal. No respiratory distress.     Breath sounds: Normal breath sounds. No wheezing.  Abdominal:     General: Bowel sounds are normal. There is no distension.     Palpations: Abdomen is soft.     Tenderness: There is abdominal tenderness.     Comments: Tenderness throughout with very mild guarding, no rebound  Musculoskeletal:        General: Normal range of motion.     Cervical back: Normal range of motion and neck supple.  Skin:    General: Skin is warm and dry.  Neurological:     General: No focal deficit present.     Mental Status: Patrick [name] is alert.  Psychiatric:        Mood and Affect: Mood normal.        Behavior: Behavior normal.      Consultants:  GI  Procedures:    Data Reviewed: Results for orders placed or performed during the hospital encounter of 01/11/22 (from the past 24 hour(s))  CBC with Differential/Platelet     Status: Abnormal   Collection Time: 01/11/22  4:48 PM  Result Value Ref Range   WBC 9.9 4.0 - 10.5 K/uL   RBC 4.75 4.22 - 5.81 MIL/uL   Hemoglobin 16.4 13.0 - 17.0 g/dL   HCT 01/13/22 66.4 - 40.3 %   MCV 96.8 80.0 - 100.0 fL   MCH 34.5 (H) 26.0 - 34.0 pg   MCHC 35.7 30.0 - 36.0 g/dL   RDW 47.4 25.9 - 56.3 %   Platelets 83 (L) 150 - 400 K/uL   nRBC 0.0 0.0 - 0.2 %   Neutrophils Relative % 81 %   Neutro Abs 8.0 (H) 1.7 - 7.7 K/uL   Lymphocytes Relative 13 %   Lymphs Abs 1.3 0.7 - 4.0 K/uL   Monocytes Relative 6 %   Monocytes Absolute 0.6 0.1 - 1.0 K/uL   Eosinophils Relative 0 %   Eosinophils Absolute 0.0 0.0 - 0.5 K/uL   Basophils Relative 0 %   Basophils Absolute 0.0 0.0 - 0.1 K/uL   RBC Morphology MORPHOLOGY UNREMARKABLE    Immature Granulocytes 0 %   Abs Immature Granulocytes 0.03 0.00 - 0.07 K/uL   Comprehensive metabolic panel     Status: Abnormal   Collection Time: 01/11/22  4:48 PM  Result Value Ref Range   Sodium 137 135 - 145 mmol/L   Potassium 3.2 (L) 3.5 - 5.1 mmol/L   Chloride 102 98 - 111 mmol/L   CO2 21 (L) 22 - 32 mmol/L   Glucose, Bld 123 (H) 70 - 99 mg/dL   BUN 13 6 - 20 mg/dL   Creatinine, Ser 01/13/22 0.61 - 1.24 mg/dL   Calcium 9.1 8.9 - 6.43 mg/dL   Total Protein  8.6 (H) 6.5 - 8.1 g/dL   Albumin 4.7 3.5 - 5.0 g/dL   AST 68 (H) 15 - 41 U/L   ALT 48 (H) 0 - 44 U/L   Alkaline Phosphatase 69 38 - 126 U/L   Total Bilirubin 0.8 0.3 - 1.2 mg/dL   GFR, Estimated >53 >61 mL/min   Anion gap 14 5 - 15  Lipase, blood     Status: Abnormal   Collection Time: 01/11/22  4:48 PM  Result Value Ref Range   Lipase 383 (H) 11 - 51 U/L  Magnesium     Status: Abnormal   Collection Time: 01/11/22  7:51 PM  Result Value Ref Range   Magnesium 1.5 (L) 1.7 - 2.4 mg/dL  HIV Antibody (routine testing w rflx)     Status: None   Collection Time: 01/11/22  7:51 PM  Result Value Ref Range   HIV Screen 4th Generation wRfx Non Reactive Non Reactive  CBC     Status: Abnormal   Collection Time: 01/12/22  3:42 AM  Result Value Ref Range   WBC 6.4 4.0 - 10.5 K/uL   RBC 4.19 (L) 4.22 - 5.81 MIL/uL   Hemoglobin 14.3 13.0 - 17.0 g/dL   HCT 44.3 15.4 - 00.8 %   MCV 98.8 80.0 - 100.0 fL   MCH 34.1 (H) 26.0 - 34.0 pg   MCHC 34.5 30.0 - 36.0 g/dL   RDW 67.6 19.5 - 09.3 %   Platelets 57 (L) 150 - 400 K/uL   nRBC 0.0 0.0 - 0.2 %  Comprehensive metabolic panel     Status: Abnormal   Collection Time: 01/12/22  3:42 AM  Result Value Ref Range   Sodium 133 (L) 135 - 145 mmol/L   Potassium 3.6 3.5 - 5.1 mmol/L   Chloride 100 98 - 111 mmol/L   CO2 24 22 - 32 mmol/L   Glucose, Bld 121 (H) 70 - 99 mg/dL   BUN 11 6 - 20 mg/dL   Creatinine, Ser 2.67 0.61 - 1.24 mg/dL   Calcium 8.5 (L) 8.9 - 10.3 mg/dL   Total Protein 7.4 6.5 - 8.1 g/dL   Albumin 3.8 3.5 - 5.0 g/dL   AST 44 (H) 15 - 41 U/L   ALT  37 0 - 44 U/L   Alkaline Phosphatase 54 38 - 126 U/L   Total Bilirubin 1.2 0.3 - 1.2 mg/dL   GFR, Estimated >12 >45 mL/min   Anion gap 9 5 - 15  Protime-INR     Status: None   Collection Time: 01/12/22 11:11 AM  Result Value Ref Range   Prothrombin Time 15.1 11.4 - 15.2 seconds   INR 1.2 0.8 - 1.2    I have Reviewed nursing notes, Vitals, and Lab results since pt's last encounter. Pertinent lab results : see above I have ordered test including BMP, CBC, Mg I have reviewed the last note from staff over past 24 hours I have discussed pt's care plan and test results with nursing staff, case manager   LOS: 0 days   Lewie Chamber, MD [facility] 01/12/2022, 12:32 PM

## 2022-01-12 NOTE — Progress Notes (Signed)
Received notice from nurse tech that patient requesting to leave the hospital and remove his IV; called rapid response RN for assistance; spoke with patient and asked if he would like additional medication for symptoms of withdrawal; patient actively attempted to remove IV from arm; MD notified; Enid Derry, rapid response RN notified; patient signed AMA form; IV removed; patient escorted downstairs to front of hospital; AMA form placed in chart

## 2022-01-12 NOTE — Significant Event (Signed)
Rapid response was made aware that patient was attempting to leave the hospital Aganst medical advise.    Rapid response assessed patient at bedside. Patient did appear to be shaking and withdrawing from ETOH. Patient was A/O X4 and informed myself he had called an [place] taxi.    MD Girguis notified, and deemed patient able to make his own decisions.    RN escorted patient to elevator

## 2022-01-13 LAB — HCV RNA QUANT: HCV Quantitative: NOT DETECTED IU/mL (ref >50)

## 2022-01-13 SURGERY — ESOPHAGOGASTRODUODENOSCOPY (EGD) WITH PROPOFOL
Anesthesia: Monitor Anesthesia Care

## 2022-01-14 NOTE — Discharge Summary (Signed)
Physician Discharge Summary   Patrick [name] NAT:[id number] DOB: 01-26-1976 DOA: 01/11/2022  PCP: Patient, No Pcp Per  Admit date: 01/11/2022 Discharge date: 01/12/2022  Patient left [facility]    Hospital Course: Patrick [name] is a 46 yo male with PMH ongoing etoh use, HTN, HCV who presented to the hospital with N/V/D and abdominal pain.  He had reported having diarrhea approximately 3 days prior to hospitalization and then developing nausea and vomiting.  He denied any bloody vomitus or blood elsewhere. CT A/P showed acute uncomplicated pancreatitis mainly involving pancreatic head.  Progressive changes of cirrhosis and portal venous hypertension were also noted with mention of portal venous collaterals, paraesophageal varices, and splenomegaly.  Also some concern for a possible small pseudocyst involving the pancreatic head. He was started on IV fluids, bowel rest, and admitted for pain control.  GI was also consulted on admission due to presence of paraesophageal varices.  Assessment and Plan: * Acute alcoholic pancreatitis - due to ongoing alcohol use; patient reports ~3 drinks daily however gets tremors if does not drink he states - has appetite; GI has advanced to [facility] - continue IVF for now - pain and antiemetics as needed  Esophageal varices without bleeding (HCC) - Periesophageal varices noted on CT A/P on 01/11/2022; per GI, patient had EGD on 09/04/2019 with no varices noted at that time - Patient did not have any bleeding prior to admission with his retching episodes -Hemoglobin remains stable -Appreciate GI assistance inpatient.  Follow-up plan for repeat EGD whether inpatient versus outpatient although compliance with outpt followup is a concern   Alcohol use disorder, severe, dependence (HCC) - continue CIWA protocol   Alcoholic cirrhosis of liver without ascites (HCC) - CT shows progressive changes of cirrhosis and portal venous hypertension with portal venous collaterals -No edema  appreciated at this time - Patient has been counseled extensively on alcohol cessation  Chronic hepatitis C without hepatic coma (HCC) - Seen by Dr. Ninetta Lights previously in 2021; appears has been treated with Mavyret x 8 weeks (initiated 11/27/19) (F3, Genotype 1b) - Follow-up HCV RNA  Thrombocytopenia (HCC) Presumably secondary to EtOH abuse + cirrhosis with portal HTN as demonstrated on CT.    Principal Diagnosis: Acute alcoholic pancreatitis  Discharge Diagnoses: Active Hospital Problems   Diagnosis Date Noted   Acute alcoholic pancreatitis 01/11/2022    Priority: 1.   Esophageal varices without bleeding (HCC) 01/11/2022    Priority: 2.   Alcohol use disorder, severe, dependence (HCC) 02/27/2017    Priority: 3.   Alcoholic cirrhosis of liver without ascites (HCC) 01/11/2022    Priority: 4.   Chronic hepatitis C without hepatic coma (HCC) 09/16/2019    Priority: 5.   Acute pancreatitis 01/12/2022   Thrombocytopenia (HCC) 01/11/2022    Resolved Hospital Problems  No resolved problems to display.      Allergies as of 01/12/2022       Reactions   Mushroom Extract Complex Diarrhea, Itching, Other (See Comments)   Itchy throat, also Pt states his throat swells shut also        Medication List     ASK your doctor about these medications    amoxicillin-clavulanate 875-125 MG tablet Commonly known as: AUGMENTIN Take 1 tablet by mouth 2 (two) times daily.   fluticasone 50 MCG/ACT nasal spray Commonly known as: FLONASE Place 2 sprays into both nostrils daily.   gabapentin 300 MG capsule Commonly known as: NEURONTIN TAKE 1 CAPSULE (300 MG TOTAL) BY MOUTH 3 (THREE) TIMES DAILY.  ibuprofen 200 MG tablet Commonly known as: ADVIL Take 200 mg by mouth every 6 (six) hours as needed for mild pain.   lisinopril 2.5 MG tablet Commonly known as: ZESTRIL Take 1 tablet (2.5 mg total) by mouth daily.   Mavyret 100-40 MG Tabs Generic drug: Glecaprevir-Pibrentasvir Take 3  tablets by mouth daily with breakfast.   methocarbamol 500 MG tablet Commonly known as: Robaxin Take 1 tablet (500 mg total) by mouth every 8 (eight) hours as needed for muscle spasms.   MULTIVITAMIN PO Take 1 tablet by mouth daily.   pantoprazole 40 MG tablet Commonly known as: PROTONIX Take 1 tablet (40 mg total) by mouth 2 (two) times daily before a meal.   potassium chloride SA 20 MEQ tablet Commonly known as: KLOR-CON M TAKE 1 TABLET (20 MEQ TOTAL) BY MOUTH DAILY.   potassium chloride SA 20 MEQ tablet Commonly known as: KLOR-CON M Take 1 tablet (20 mEq total) by mouth daily.   sucralfate 1 g tablet Commonly known as: Carafate Take 1 tablet (1 g total) by mouth 4 (four) times daily -  with meals and at bedtime.   Vitamin D (Ergocalciferol) 1.25 MG (50000 UNIT) Caps capsule Commonly known as: DRISDOL Take 1 capsule (50,000 Units total) by mouth every 7 (seven) days.        Allergies  Allergen Reactions   Mushroom Extract Complex Diarrhea, Itching and Other (See Comments)    Itchy throat, also Pt states his throat swells shut also    Consultations: GI  Discharge Exam: BP (!) 164/117   Pulse 99   Temp 98.5 F (36.9 C) (Oral)   Resp 14   Ht 5\' 11"  (1.803 m)   Wt 85.5 kg   SpO2 99%   BMI 26.29 kg/m  Left AMA. Exam performed earlier same day.   The results of significant diagnostics from this hospitalization (including imaging, microbiology, ancillary and laboratory) are listed below for reference.   Microbiology: No results found for this or any previous visit (from the past 240 hour(s)).   Labs: BNP (last 3 results) No results for input(s): "BNP" in the last 8760 hours. Basic Metabolic Panel: Recent Labs  Lab 01/11/22 1648 01/11/22 1951 01/12/22 0342  NA 137  --  133*  K 3.2*  --  3.6  CL 102  --  100  CO2 21*  --  24  GLUCOSE 123*  --  121*  BUN 13  --  11  CREATININE 1.16  --  0.90  CALCIUM 9.1  --  8.5*  MG  --  1.5*  --    Liver  Function Tests: Recent Labs  Lab 01/11/22 1648 01/12/22 0342  AST 68* 44*  ALT 48* 37  ALKPHOS 69 54  BILITOT 0.8 1.2  PROT 8.6* 7.4  ALBUMIN 4.7 3.8   Recent Labs  Lab 01/11/22 1648  LIPASE 383*   No results for input(s): "AMMONIA" in the last 168 hours. CBC: Recent Labs  Lab 01/11/22 1648 01/12/22 0342  WBC 9.9 6.4  NEUTROABS 8.0*  --   HGB 16.4 14.3  HCT 46.0 41.4  MCV 96.8 98.8  PLT 83* 57*   Cardiac Enzymes: No results for input(s): "CKTOTAL", "CKMB", "CKMBINDEX", "TROPONINI" in the last 168 hours. BNP: Invalid input(s): "POCBNP" CBG: No results for input(s): "GLUCAP" in the last 168 hours. D-Dimer No results for input(s): "DDIMER" in the last 72 hours. Hgb A1c No results for input(s): "HGBA1C" in the last 72 hours. Lipid Profile No results  for input(s): "CHOL", "HDL", "LDLCALC", "TRIG", "CHOLHDL", "LDLDIRECT" in the last 72 hours. Thyroid function studies No results for input(s): "TSH", "T4TOTAL", "T3FREE", "THYROIDAB" in the last 72 hours.  Invalid input(s): "FREET3" Anemia work up No results for input(s): "VITAMINB12", "FOLATE", "FERRITIN", "TIBC", "IRON", "RETICCTPCT" in the last 72 hours. Urinalysis    Component Value Date/Time   COLORURINE YELLOW 01/03/2022 1205   APPEARANCEUR CLEAR 01/03/2022 1205   LABSPEC 1.013 01/03/2022 1205   PHURINE 6.0 01/03/2022 1205   GLUCOSEU NEGATIVE 01/03/2022 1205   HGBUR NEGATIVE 01/03/2022 1205   [place] 01/03/2022 1205   [place] 01/03/2022 1205   PROTEINUR NEGATIVE 01/03/2022 1205   UROBILINOGEN 0.2 09/25/2010 2309   NITRITE NEGATIVE 01/03/2022 1205   LEUKOCYTESUR NEGATIVE 01/03/2022 1205   Sepsis Labs Recent Labs  Lab 01/11/22 1648 01/12/22 0342  WBC 9.9 6.4   Microbiology No results found for this or any previous visit (from the past 240 hour(s)).  Procedures/Studies: CT ABDOMEN PELVIS W CONTRAST  Result Date: 01/11/2022 CLINICAL DATA:  Abdominal pain vomiting since last  evening. EXAM: CT ABDOMEN AND PELVIS WITH CONTRAST TECHNIQUE: Multidetector CT imaging of the abdomen and pelvis was performed using the standard protocol following bolus administration of intravenous contrast. RADIATION DOSE REDUCTION: This exam was performed according to the departmental dose-optimization program which includes automated exposure control, adjustment of the mA and/or kV according to patient size and/or use of iterative reconstruction technique. CONTRAST:  147mL OMNIPAQUE IOHEXOL 300 MG/ML  SOLN COMPARISON:  08/28/2018 FINDINGS: Lower chest: The lung bases are clear of acute process. No pleural effusion or pulmonary lesions. The heart is normal in size. No pericardial effusion. The distal esophagus and aorta are unremarkable. Hepatobiliary: Diffuse fatty infiltration of the liver and progressive changes of cirrhosis. Irregular hepatic contour, dilated hepatic fissures and increased caudate to right lobe ratio. No worrisome hepatic lesions to suggest hepatoma or dysplastic nodule. Changes of portal venous hypertension with portal venous collaterals and paraesophageal varices. The portal and splenic veins are patent. The gallbladder is unremarkable. No common bile duct dilatation. Pancreas: Moderate inflammatory changes in and around the pancreatic head consistent with focal pancreatitis. No obvious involvement of the body or tail region the pancreas. There is a small low-attenuation lesion in the pancreatic head which could be a small pseudocysts. I do not see this on the prior CT scan. Attention on follow-up is suggested. Normal enhancement of the pancreas without evidence for pancreatic necrosis. There is some surrounding fluid in the anterior pararenal space on the right but no hemorrhage or abscess. Associated significant inflammation involving the second and third portions of the duodenum. Spleen: Splenomegaly. No splenic lesions. Adrenals/Urinary Tract: Adrenal glands and kidneys are  unremarkable. Renal cortical scarring changes are noted. The bladder is unremarkable. Stomach/Bowel: Stomach is unremarkable. There is moderate inflammation of the second and third portions of the duodenum. The small bowel and colon are unremarkable. The terminal ileum and appendix are normal. There is diffuse scattered colonic diverticulosis. Vascular/Lymphatic: Age advanced atherosclerotic calcification involving the aorta and iliac arteries but no aneurysm or dissection. The major venous structures are patent. Upper abdominal lymph nodes typical with cirrhosis. No retroperitoneal adenopathy. Reproductive: The prostate gland and seminal vesicles are unremarkable. Other: No ascites or abdominal wall hernia. Bilateral inguinal hernias containing fat are noted. Musculoskeletal: No significant bony findings. There is a remote compression fracture of L2. IMPRESSION: 1. CT findings consistent with acute uncomplicated pancreatitis, mainly involving pancreatic head. 2. Progressive changes of cirrhosis and portal venous  hypertension with portal venous collaterals, paraesophageal varices and splenomegaly. 3. Small low-attenuation lesion in the pancreatic head could be a small pseudocyst. Attention on follow-up is suggested. 4. Age advanced atherosclerotic calcification involving the aorta and iliac arteries. Aortic Atherosclerosis (ICD10-I70.0). Electronically Signed   By: Rudie Meyer M.D.   On: 01/11/2022 18:32     Time coordinating discharge: Over 30 minutes    Lewie Chamber, MD  [facility] 01/14/2022, 7:27 AM

## 2022-01-25 ENCOUNTER — Other Ambulatory Visit: Payer: Self-pay

## 2022-02-05 ENCOUNTER — Other Ambulatory Visit: Payer: Self-pay

## 2022-02-05 ENCOUNTER — Encounter (HOSPITAL_COMMUNITY): Payer: Self-pay

## 2022-02-05 ENCOUNTER — Emergency Department (HOSPITAL_COMMUNITY)
Admission: EM | Admit: 2022-02-05 | Discharge: 2022-02-05 | Disposition: A | Discharge status: Home / Self Care | Payer: Self-pay | Attending: Emergency Medicine | Admitting: Emergency Medicine

## 2022-02-05 DIAGNOSIS — K047 Periapical abscess without sinus: Secondary | ICD-10-CM

## 2022-02-05 DIAGNOSIS — Z79899 Other long term (current) drug therapy: Secondary | ICD-10-CM | POA: Insufficient documentation

## 2022-02-05 MED ORDER — DICLOFENAC SODIUM 75 MG PO TBEC: 75.0000 mg | DELAYED_RELEASE_TABLET | Freq: Two times a day (BID) | ORAL | 0 refills | Status: AC

## 2022-02-05 MED ORDER — CLINDAMYCIN HCL 300 MG PO CAPS: 300.0000 mg | ORAL_CAPSULE | Freq: Three times a day (TID) | ORAL | 0 refills | Status: AC

## 2022-02-05 MED ORDER — CLINDAMYCIN HCL 300 MG PO CAPS
300.0000 mg | ORAL_CAPSULE | Freq: Three times a day (TID) | ORAL | 0 refills | Status: DC
  Filled 2022-02-05: qty 30, 10d supply, fill #0

## 2022-02-05 NOTE — ED Triage Notes (Signed)
Patient complains of abscess to roof of mouth and just recently finished antibiotics for dental abscess. Reports foul taste with same.

## 2022-02-05 NOTE — Discharge Instructions (Addendum)
Return if any problems.

## 2022-02-05 NOTE — ED Provider Notes (Signed)
[facility] EMERGENCY DEPARTMENT Provider Note   CSN: [id number] Arrival date & time: 02/05/22  1313     History  No chief complaint on file.   Patrick [name] is a 46 y.o. male.  Pt complains of bad teeth and frequent infections.  Pt reports gum line is swollen.  Pt reports RN at [facility] is trying to get him an appointment with a dentist   The history is provided by the patient. No language interpreter was used.  Dental Pain Location:  Upper Quality:  Aching Severity:  Moderate Onset quality:  Gradual Duration:  1 week Timing:  Constant Progression:  Worsening Chronicity:  New Context: abscess   Worsened by:  Nothing Ineffective treatments:  None tried      Home Medications Prior to Admission medications   Medication Sig Start Date End Date Taking? Authorizing Provider  amoxicillin-clavulanate (AUGMENTIN) 875-125 MG tablet Take 1 tablet by mouth 2 (two) times daily. Patient not taking: Reported on 01/11/2022 12/29/21   Waldon Merl, PA-C  fluticasone [facility]) 50 MCG/ACT nasal spray Place 2 sprays into both nostrils daily. 12/29/21   Waldon Merl, PA-C  gabapentin (NEURONTIN) 300 MG capsule TAKE 1 CAPSULE (300 MG TOTAL) BY MOUTH 3 (THREE) TIMES DAILY. Patient taking differently: Take 300 mg by mouth 3 (three) times daily. 11/17/21 11/17/22  Hoy Register, MD  Glecaprevir-Pibrentasvir (MAVYRET) 100-40 MG TABS Take 3 tablets by mouth daily with breakfast. Patient not taking: Reported on 01/11/2022 12/01/19   Kuppelweiser, Cassie L, RPH-CPP  ibuprofen (ADVIL) 200 MG tablet Take 200 mg by mouth every 6 (six) hours as needed for mild pain.    [provider]  lisinopril (ZESTRIL) 2.5 MG tablet Take 1 tablet (2.5 mg total) by mouth daily. 11/17/21   Hoy Register, MD  methocarbamol (ROBAXIN) 500 MG tablet Take 1 tablet (500 mg total) by mouth every 8 (eight) hours as needed for muscle spasms. Patient not taking: Reported on 01/11/2022 08/27/17    Barnetta Chapel, PA-C  Multiple Vitamin (MULTIVITAMIN PO) Take 1 tablet by mouth daily.    [provider]  pantoprazole (PROTONIX) 40 MG tablet Take 1 tablet (40 mg total) by mouth 2 (two) times daily before a meal. Patient not taking: Reported on 01/11/2022 01/03/22   Derwood Kaplan, MD  potassium chloride SA (KLOR-CON) 20 MEQ tablet Take 1 tablet (20 mEq total) by mouth daily. Patient not taking: Reported on 01/11/2022 07/14/20   Anders Simmonds, PA-C  potassium chloride SA (KLOR-CON) 20 MEQ tablet TAKE 1 TABLET (20 MEQ TOTAL) BY MOUTH DAILY. 01/15/20 01/14/21  Anders Simmonds, PA-C  sucralfate (CARAFATE) 1 g tablet Take 1 tablet (1 g total) by mouth 4 (four) times daily -  with meals and at bedtime. 11/17/21   Hoy Register, MD  Vitamin D, Ergocalciferol, (DRISDOL) 1.25 MG (50000 UNIT) CAPS capsule Take 1 capsule (50,000 Units total) by mouth every 7 (seven) days. Patient not taking: Reported on 01/11/2022 03/18/20   Anders Simmonds, PA-C      Allergies    Mushroom extract complex    Review of Systems   Review of Systems  HENT:  Positive for dental problem.   All other systems reviewed and are negative.   Physical Exam Updated Vital Signs BP (!) 143/103 (BP Location: Right Arm)   Pulse (!) 103   Temp 98.2 F (36.8 C) (Oral)   Resp 18   SpO2 98%  Physical Exam Vitals and nursing note reviewed.  Constitutional:  Appearance: He is well-developed.  HENT:     Head: Normocephalic.     Mouth/Throat:     Comments: Severe decay  swelling upper frontal gumline  Cardiovascular:     Rate and Rhythm: Normal rate.  Pulmonary:     Effort: Pulmonary effort is normal.  Abdominal:     General: There is no distension.  Musculoskeletal:        General: Normal range of motion.     Cervical back: Normal range of motion.  Skin:    General: Skin is warm.  Neurological:     General: No focal deficit present.     Mental Status: He is alert and oriented to person, place, and  time.  Psychiatric:        Mood and Affect: Mood normal.     ED Results / Procedures / Treatments   Labs (all labs ordered are listed, but only abnormal results are displayed) Labs Reviewed - No data to display  EKG None  Radiology No results found.  Procedures Procedures    Medications Ordered in ED Medications - No data to display  ED Course/ Medical Decision Making/ A&P                           Medical Decision Making Pt complains of a dental infection  Amount and/or Complexity of Data Reviewed External Data Reviewed: notes.    Details: Infectious disease notes reviewed,    Risk Prescription drug management.           Final Clinical Impression(s) / ED Diagnoses Final diagnoses:  Dental infection    Rx / DC Orders ED Discharge Orders          Ordered    clindamycin (CLEOCIN) 300 MG capsule  3 times daily,   Status:  Discontinued        02/05/22 1658    clindamycin (CLEOCIN) 300 MG capsule  3 times daily        02/05/22 1700    diclofenac (VOLTAREN) 75 MG EC tablet  2 times daily        02/05/22 1701              Sidney Ace 02/05/22 1701    Isla Pence, MD 02/05/22 1715

## 2022-02-06 ENCOUNTER — Other Ambulatory Visit: Payer: Self-pay

## 2022-02-07 ENCOUNTER — Other Ambulatory Visit: Payer: Self-pay

## 2022-02-14 ENCOUNTER — Encounter: Payer: Self-pay | Admitting: Family Medicine

## 2022-02-14 ENCOUNTER — Ambulatory Visit: Payer: Self-pay | Attending: Family Medicine | Admitting: Family Medicine

## 2022-02-14 ENCOUNTER — Other Ambulatory Visit: Payer: Self-pay

## 2022-02-14 VITALS — BP 120/86 | HR 95 | Ht 71.0 in | Wt 209.2 lb

## 2022-02-14 DIAGNOSIS — G621 Alcoholic polyneuropathy: Secondary | ICD-10-CM

## 2022-02-14 DIAGNOSIS — G4709 Other insomnia: Secondary | ICD-10-CM

## 2022-02-14 DIAGNOSIS — K852 Alcohol induced acute pancreatitis without necrosis or infection: Secondary | ICD-10-CM

## 2022-02-14 DIAGNOSIS — R399 Unspecified symptoms and signs involving the genitourinary system: Secondary | ICD-10-CM

## 2022-02-14 DIAGNOSIS — K7030 Alcoholic cirrhosis of liver without ascites: Secondary | ICD-10-CM

## 2022-02-14 DIAGNOSIS — R635 Abnormal weight gain: Secondary | ICD-10-CM

## 2022-02-14 MED ORDER — TAMSULOSIN HCL 0.4 MG PO CAPS
0.4000 mg | ORAL_CAPSULE | Freq: Every day | ORAL | 3 refills | Status: DC
  Filled 2022-02-14: qty 30, 30d supply, fill #0

## 2022-02-14 MED ORDER — NALTREXONE HCL 50 MG PO TABS
50.0000 mg | ORAL_TABLET | Freq: Every day | ORAL | 3 refills | Status: AC
  Filled 2022-02-14: qty 30, 30d supply, fill #0

## 2022-02-14 MED ORDER — OMEPRAZOLE 40 MG PO CPDR
40.0000 mg | DELAYED_RELEASE_CAPSULE | Freq: Every day | ORAL | 3 refills | Status: DC
  Filled 2022-02-14: qty 30, 30d supply, fill #0

## 2022-02-14 NOTE — Patient Instructions (Signed)
Alcohol Misuse and Dependence Information, Adult Alcohol is a widely available drug and people choose to drink alcohol in different amounts. Alcohol misuse and dependence can have a negative effect on your life. Alcohol misuse is when you use alcohol too much or too often. You may have a hard time setting a limit on the amount you drink. Alcohol dependence is when you use alcohol consistently for a period of time, and your body changes as a result. Alcohol dependence can make it hard for you to stop drinking because you may start to feel sick or different when you do not drink alcohol. These symptoms are known as withdrawal. People who drink alcohol very often and in large amounts, may develop what is called an alcohol use disorder. How can alcohol misuse and dependence affect me? Drinking too much can lead to addiction. You may feel like you need alcohol to function normally. You may drink alcohol before work in the morning, during the day, or as soon as you get home from work in the evening. These actions can result in: Poor work performance. Job loss. Financial problems. Car crashes or criminal charges from driving after drinking alcohol. Problems in your relationships with friends and family. Losing the trust and respect of coworkers, friends, and family. Drinking heavily over a long period of time can permanently damage your body and brain, and can cause lifelong health issues, such as: Damage to your liver or pancreas. Heart problems, high blood pressure, or stroke. Certain cancers. Decreased ability to fight infections. Brain or nerve damage. Depression. Early death, also called premature death. If you are careless or you crave alcohol, it is easy to drink more than your body can handle (overdose). Alcohol overdose is a serious situation that requires hospitalization. It may lead to permanent injuries or death. What can increase my risk? Having a family history of alcohol misuse. Having  depression or other mental health conditions. Beginning to drink at an early age. Binge drinking often. Experiencing trauma, stress, and an unstable home life during childhood. Spending time with people who drink often. What actions can I take to prevent alcohol misuse and dependence? Do not drink alcohol if: Your health care provider tells you not to drink. You are pregnant, may be pregnant, or are planning to become pregnant. If you drink alcohol: Limit how much you have to: 0-1 drink a day for women who are not pregnant. 0-2 drinks a day for men. Know how much alcohol is in your drink. In the [place] one drink equals one 12 oz bottle of beer (355 mL), one 5 oz glass of wine (148 mL), or one 1 oz glass of hard liquor (44 mL). If you think you have an alcohol dependency problem, decide to stop drinking. This can be very hard to do if you are used to frequently drinking alcohol. If you begin to have withdrawal symptoms, talk with your health care provider or a person that you trust. These symptoms may include anxiety, shaky hands, headache, nausea, sweating, or not being able to sleep. Choose to drink nonalcoholic beverages in social gatherings and places where there may be alcohol. Activity Spend more time on activities that you enjoy that do not involve alcohol, like hobbies or exercise. Find healthy ways to cope with stress, such as meditation or spending time with people you care about. General information Talk to your family, coworkers, and friends about supporting you in your efforts to stop drinking. If they drink, ask them not to drink   around you. Spend more time with people who do not drink alcohol. If you think that you have an alcohol dependency problem: Tell friends or family about your concerns. Talk with your health care provider or another health professional about where to get help. Work with a therapist and a chemical dependency counselor. Consider joining a support group  for people who struggle with alcohol misuse and dependence. Where to find support  Your health care provider. SMART Recovery: smartrecovery.org Local treatment centers or chemical dependency counselors. Local AA groups in your community: aa.org Where to find more information Centers for Disease Control and Prevention: cdc.gov National Institute on Alcohol Abuse and Alcoholism: niaaa.nih.gov Alcoholics Anonymous (AA): aa.org Contact a health care provider if: You drank more or for longer than you intended on more than one occasion. You often drink to the point of vomiting or passing out. You have problems in your life due to drinking, but you continue to drink. You keep drinking even though you feel anxious, depressed, or have experienced memory loss. You have stopped doing the things you used to enjoy in order to drink. You have to drink more than you used to in order to get the effect you want. You experience anxiety, sweating, nausea, shakiness, and trouble sleeping when you try to stop drinking. Get help right away if: You have serious withdrawal symptoms, including: Confusion. Racing heart. High blood pressure. Fever. These symptoms may be an emergency. Get help right away. Call 911. Do not wait to see if the symptoms will go away. Do not drive yourself to the hospital. Also, get help right away if: You have thoughts about hurting yourself or others. Take one of these steps if you feel like you may hurt yourself or others, or have thoughts about taking your own life: Call 911. Call the National Suicide Prevention Lifeline at 1-800-273-8255 or 988. This is open 24 hours a day. Text the Crisis Text Line at 741741. Summary Alcohol misuse and dependence can have a negative effect on your life. Drinking too much or too often can lead to addiction. If you drink alcohol, limit how much you use. If you are having trouble keeping your drinking under control, find ways to change your  behavior. Hobbies, calming activities, exercise, or support groups can help. If you feel you need help with changing your drinking habits, talk with your health care provider, a good friend, or a therapist, or go to a support group. This information is not intended to replace advice given to you by your health care provider. Make sure you discuss any questions you have with your health care provider. Document Revised: 06/22/2021 Document Reviewed: 06/22/2021 Elsevier Patient Education  2023 Elsevier Inc.  

## 2022-02-14 NOTE — Progress Notes (Signed)
Subjective:  Patient ID: Patrick [name], male    DOB: 10/28/1975  Age: 46 y.o. MRN: [id number]  CC: Insomnia and Weight Gain   HPI Patrick [name] is a 46 y.o. year old male with a history of  alcoholic gastritis, alcohol abuse, hypertension  He was hospitalized for alcoholic pancreatitis last month but left AMA. CT abdomen and pelvis revealed: IMPRESSION: 1. CT findings consistent with acute uncomplicated pancreatitis, mainly involving pancreatic head. 2. Progressive changes of cirrhosis and portal venous hypertension with portal venous collaterals, paraesophageal varices and splenomegaly. 3. Small low-attenuation lesion in the pancreatic head could be a small pseudocyst. Attention on follow-up is suggested. 4. Age advanced atherosclerotic calcification involving the aorta and iliac arteries.   Aortic Atherosclerosis (ICD10-I70.0).   Interval History: He informs me that he was receiving too much medication for alcohol withdrawal Patrick [name] the hospital would not decrease his dose intact. He drinks a beer a day and states he has cut back.  Endorses some intermittent abdominal pain but no nausea or vomiting.  He has gained 11 lbs in 3 months and is concerned that he is gaining weight despite eating salads. He walks a mile a day and works in [employer] intermittently  He does not get a lot of sleep and wakes up to use the bathroom but after that he is awake. He goes to bed around midnight, denies caffeine intake. Past Medical History:  Diagnosis Date  . Eczema   . Hypertension   . Migraine headache     Past Surgical History:  Procedure Laterality Date  . BIOPSY  09/04/2019   Procedure: BIOPSY;  Surgeon: Jake Bathe, MD;  Location: [facility] ENDOSCOPY;  Service: Cardiovascular;;  . ESOPHAGOGASTRODUODENOSCOPY (EGD) WITH PROPOFOL  09/04/2019   Procedure: ESOPHAGOGASTRODUODENOSCOPY (EGD) WITH PROPOFOL;  Surgeon: Jake Bathe, MD;  Location: [facility] ENDOSCOPY;  Service: Cardiovascular;;  . TEE  WITHOUT CARDIOVERSION N/A 09/04/2019   Procedure: TRANSESOPHAGEAL ECHOCARDIOGRAM (TEE);  Surgeon: Jake Bathe, MD;  Location: [facility] ENDOSCOPY;  Service: Cardiovascular;  Laterality: N/A;    Family History  Problem Relation Age of Onset  . Cancer Mother     Social History   Socioeconomic History  . Marital status: Single    Spouse name: Not on file  . Number of children: Not on file  . Years of education: Not on file  . Highest education level: Not on file  Occupational History  . Not on file  Tobacco Use  . Smoking status: Every Day    Packs/day: 1.00    Types: Cigarettes  . Smokeless tobacco: Never  Vaping Use  . Vaping Use: Never used  Substance and Sexual Activity  . Alcohol use: Yes    Comment: every day depends on day of week as to how much  . Drug use: Yes    Types: Marijuana    Comment: every day  . Sexual activity: Not on file  Other Topics Concern  . Not on file  Social History Narrative  . Not on file   Social Determinants of Health   Financial Resource Strain: Not on file  Food Insecurity: Food Insecurity Present (01/11/2022)   Hunger Vital Sign   . Worried About Programme researcher, broadcasting/film/video in the Last Year: Sometimes true   . Ran Out of Food in the Last Year: Sometimes true  Transportation Needs: Unmet Transportation Needs (01/11/2022)   PRAPARE - Transportation   . Lack of Transportation (Medical): Yes   . Lack of Transportation (Non-Medical): Yes  Physical Activity: Not on file  Stress: Not on file  Social Connections: Not on file    Allergies  Allergen Reactions  . Mushroom Extract Complex Diarrhea, Itching and Other (See Comments)    Itchy throat, also Pt states his throat swells shut also    Outpatient Medications Prior to Visit  Medication Sig Dispense Refill  . clindamycin (CLEOCIN) 300 MG capsule Take 1 capsule (300 mg total) by mouth 3 (three) times daily for 10 days. 30 capsule 0  . fluticasone (FLONASE) 50 MCG/ACT nasal spray Place 2 sprays  into both nostrils daily. 16 g 0  . gabapentin (NEURONTIN) 300 MG capsule TAKE 1 CAPSULE (300 MG TOTAL) BY MOUTH 3 (THREE) TIMES DAILY. (Patient taking differently: Take 300 mg by mouth 3 (three) times daily.) 90 capsule 3  . ibuprofen (ADVIL) 200 MG tablet Take 200 mg by mouth every 6 (six) hours as needed for mild pain.    Marland Kitchen lisinopril (ZESTRIL) 2.5 MG tablet Take 1 tablet (2.5 mg total) by mouth daily. 30 tablet 3  . Multiple Vitamin (MULTIVITAMIN PO) Take 1 tablet by mouth daily.    . diclofenac (VOLTAREN) 75 MG EC tablet Take 1 tablet (75 mg total) by mouth 2 (two) times daily. (Patient not taking: Reported on 02/14/2022) 12 tablet 0  . Glecaprevir-Pibrentasvir (MAVYRET) 100-40 MG TABS Take 3 tablets by mouth daily with breakfast. (Patient not taking: Reported on 01/11/2022) 84 tablet 1  . potassium chloride SA (KLOR-CON) 20 MEQ tablet TAKE 1 TABLET (20 MEQ TOTAL) BY MOUTH DAILY. 30 tablet 2  . sucralfate (CARAFATE) 1 g tablet Take 1 tablet (1 g total) by mouth 4 (four) times daily -  with meals and at bedtime. (Patient not taking: Reported on 02/14/2022) 120 tablet 3   No facility-administered medications prior to visit.     ROS Review of Systems  Constitutional:  Negative for activity change and appetite change.  HENT:  Negative for sinus pressure and sore throat.   Respiratory:  Negative for chest tightness, shortness of breath and wheezing.   Cardiovascular:  Negative for chest pain and palpitations.  Gastrointestinal:  Negative for abdominal distention, abdominal pain and constipation.  Genitourinary: Negative.   Musculoskeletal: Negative.   Psychiatric/Behavioral:  Positive for sleep disturbance. Negative for behavioral problems and dysphoric mood.     Objective:  BP 120/86   Pulse 95   Ht 5\' 11"  (1.803 m)   Wt 209 lb 3.2 oz (94.9 kg)   SpO2 94%   BMI 29.18 kg/m      02/14/2022    2:04 PM 02/05/2022    1:20 PM 01/12/2022    6:09 PM  BP/Weight  Systolic BP [id number]   Diastolic BP 86 [id number]  Wt. (Lbs) 209.2    BMI 29.18 kg/m2      Wt Readings from Last 3 Encounters:  02/14/22 209 lb 3.2 oz (94.9 kg)  01/11/22 188 lb 7.9 oz (85.5 kg)  11/17/21 188 lb 9.6 oz (85.5 kg)     Physical Exam Constitutional:      Appearance: He is well-developed.  Cardiovascular:     Rate and Rhythm: Normal rate.     Heart sounds: Normal heart sounds. No murmur heard. Pulmonary:     Effort: Pulmonary effort is normal.     Breath sounds: Normal breath sounds. No wheezing or rales.  Chest:     Chest wall: No tenderness.  Abdominal:     General: Bowel sounds are normal. There is  no distension.     Palpations: Abdomen is soft. There is no mass.     Tenderness: There is abdominal tenderness (slight diffuse generalized TTP).  Musculoskeletal:        General: Normal range of motion.     Right lower leg: No edema.     Left lower leg: No edema.  Neurological:     Mental Status: He is alert and oriented to person, place, and time.  Psychiatric:        Mood and Affect: Mood normal.       Latest Ref Rng & Units 01/12/2022    3:42 AM 01/11/2022    4:48 PM 01/03/2022   10:46 AM  CMP  Glucose 70 - 99 mg/dL 121  123  127   BUN 6 - 20 mg/dL 11  13  14    Creatinine 0.61 - 1.24 mg/dL 0.90  1.16  1.00   Sodium 135 - 145 mmol/L 133  137  139   Potassium 3.5 - 5.1 mmol/L 3.6  3.2  3.8   Chloride 98 - 111 mmol/L 100  102  110   CO2 22 - 32 mmol/L 24  21  19    Calcium 8.9 - 10.3 mg/dL 8.5  9.1  8.7   Total Protein 6.5 - 8.1 g/dL 7.4  8.6  7.9   Total Bilirubin 0.3 - 1.2 mg/dL 1.2  0.8  0.6   Alkaline Phos 38 - 126 U/L 54  69  55   AST 15 - 41 U/L 44  68  46   ALT 0 - 44 U/L 37  48  33     Lipid Panel  No results found for: "CHOL", "TRIG", "HDL", "CHOLHDL", "VLDL", "LDLCALC", "LDLDIRECT"  CBC    Component Value Date/Time   WBC 6.4 01/12/2022 0342   RBC 4.19 (L) 01/12/2022 0342   HGB 14.3 01/12/2022 0342   HGB 14.0 11/17/2021 1124   HCT 41.4 01/12/2022 0342   HCT  41.1 11/17/2021 1124   PLT 57 (L) 01/12/2022 0342   PLT 103 (L) 11/17/2021 1124   MCV 98.8 01/12/2022 0342   MCV 98 (H) 11/17/2021 1124   MCH 34.1 (H) 01/12/2022 0342   MCHC 34.5 01/12/2022 0342   RDW 13.3 01/12/2022 0342   RDW 12.5 11/17/2021 1124   LYMPHSABS 1.3 01/11/2022 1648   LYMPHSABS 1.9 11/17/2021 1124   MONOABS 0.6 01/11/2022 1648   EOSABS 0.0 01/11/2022 1648   EOSABS 0.2 11/17/2021 1124   BASOSABS 0.0 01/11/2022 1648   BASOSABS 0.1 11/17/2021 1124    Lab Results  Component Value Date   HGBA1C 5.4 11/17/2021    Assessment & Plan:  1. Alcoholic cirrhosis of liver without ascites (HCC) Asymptomatic Findings of portal hypertension, esophageal varices on CT scan He will need EGD Placed on PPI Consider switching lisinopril to a beta-blocker at next visit - Ambulatory referral to Gastroenterology  2. Lower urinary tract symptoms This is affecting his sleep Trial of tamsulosin - tamsulosin (FLOMAX) 0.4 MG CAPS capsule; Take 1 capsule (0.4 mg total) by mouth daily.  Dispense: 30 capsule; Refill: 3  3. Neuropathy, alcoholic (HCC) Stable on gabapentin  4. Alcohol-induced acute pancreatitis without infection or necrosis He has cut back on alcohol consumption Continues to have slight residual abdominal pain We will place on naltrexone - naltrexone (DEPADE) 50 MG tablet; Take 1 tablet (50 mg total) by mouth daily.  Dispense: 30 tablet; Refill: 3 - omeprazole (PRILOSEC) 40 MG capsule; Take 1 capsule (40  mg total) by mouth daily.  Dispense: 30 capsule; Refill: 3  5. Weight gain Unclear etiology of weight gain Advised to increase physical activity, reduce caloric intake  6. Other insomnia Discussed sleep hygiene Lower urinary tract symptoms possibly contributing to this.  Hopefully Flomax will prevent his multiple nighttime awakenings and improve his quality of sleep    Meds ordered this encounter  Medications  . naltrexone (DEPADE) 50 MG tablet    Sig: Take 1  tablet (50 mg total) by mouth daily.    Dispense:  30 tablet    Refill:  3  . tamsulosin (FLOMAX) 0.4 MG CAPS capsule    Sig: Take 1 capsule (0.4 mg total) by mouth daily.    Dispense:  30 capsule    Refill:  3  . omeprazole (PRILOSEC) 40 MG capsule    Sig: Take 1 capsule (40 mg total) by mouth daily.    Dispense:  30 capsule    Refill:  3    Follow-up: Return in about 3 months (around 05/17/2022) for Chronic medical conditions.       Patrick Rakes, MD, FAAFP. [facility] [place], [place]   02/14/2022, 2:28 PM

## 2022-02-20 ENCOUNTER — Encounter: Payer: Self-pay | Admitting: *Deleted

## 2022-02-22 ENCOUNTER — Telehealth: Payer: Self-pay | Admitting: Emergency Medicine

## 2022-02-22 NOTE — Telephone Encounter (Signed)
Copied from [place] [id number]. Topic: Medical Record Request - Other >> Feb 21, 2022  9:08 AM Chapman Fitch wrote: Reason for CRM: pt would like to speak with office manager or someone about needing his last OV notes and a signature / please advise asap

## 2022-02-22 NOTE — Telephone Encounter (Signed)
Pt has been called and left a VM informing him to contact the office with more information

## 2022-02-23 NOTE — Telephone Encounter (Signed)
Call returned from voicemail and patient stated he has already had the issue taken care of.

## 2022-02-27 ENCOUNTER — Other Ambulatory Visit: Payer: Self-pay | Admitting: Pharmacist

## 2022-02-27 ENCOUNTER — Other Ambulatory Visit: Payer: Self-pay

## 2022-02-27 DIAGNOSIS — I10 Essential (primary) hypertension: Secondary | ICD-10-CM

## 2022-02-27 MED ORDER — LISINOPRIL 2.5 MG PO TABS
2.5000 mg | ORAL_TABLET | Freq: Every day | ORAL | 1 refills | Status: DC
  Filled 2022-02-27: qty 60, 60d supply, fill #0

## 2022-03-05 ENCOUNTER — Other Ambulatory Visit: Payer: Self-pay

## 2022-03-05 ENCOUNTER — Encounter (HOSPITAL_COMMUNITY): Payer: Self-pay | Admitting: Emergency Medicine

## 2022-03-05 ENCOUNTER — Encounter (HOSPITAL_COMMUNITY): Payer: Self-pay

## 2022-03-05 ENCOUNTER — Emergency Department (HOSPITAL_COMMUNITY)
Admission: EM | Admit: 2022-03-05 | Discharge: 2022-03-05 | Disposition: A | Discharge status: Home / Self Care | Payer: Self-pay | Attending: Emergency Medicine | Admitting: Emergency Medicine

## 2022-03-05 ENCOUNTER — Emergency Department (HOSPITAL_COMMUNITY): Payer: Self-pay

## 2022-03-05 ENCOUNTER — Emergency Department (HOSPITAL_COMMUNITY)
Admission: EM | Admit: 2022-03-05 | Discharge: 2022-03-06 | Disposition: A | Discharge status: Home / Self Care | Payer: Self-pay | Attending: Emergency Medicine | Admitting: Emergency Medicine

## 2022-03-05 DIAGNOSIS — R109 Unspecified abdominal pain: Secondary | ICD-10-CM | POA: Insufficient documentation

## 2022-03-05 DIAGNOSIS — Z79899 Other long term (current) drug therapy: Secondary | ICD-10-CM | POA: Insufficient documentation

## 2022-03-05 DIAGNOSIS — R0789 Other chest pain: Secondary | ICD-10-CM | POA: Insufficient documentation

## 2022-03-05 DIAGNOSIS — I10 Essential (primary) hypertension: Secondary | ICD-10-CM | POA: Insufficient documentation

## 2022-03-05 DIAGNOSIS — R000 Tachycardia, unspecified: Secondary | ICD-10-CM | POA: Insufficient documentation

## 2022-03-05 DIAGNOSIS — N200 Calculus of kidney: Secondary | ICD-10-CM

## 2022-03-05 DIAGNOSIS — R079 Chest pain, unspecified: Secondary | ICD-10-CM

## 2022-03-05 DIAGNOSIS — Y908 Blood alcohol level of 240 mg/100 ml or more: Secondary | ICD-10-CM | POA: Insufficient documentation

## 2022-03-05 DIAGNOSIS — R739 Hyperglycemia, unspecified: Secondary | ICD-10-CM | POA: Insufficient documentation

## 2022-03-05 LAB — CBC WITH DIFFERENTIAL/PLATELET
Abs Immature Granulocytes: 0.04 10*3/uL (ref 0.00–0.07)
Basophils Absolute: 0.1 10*3/uL (ref 0.0–0.1)
Basophils Relative: 1 %
Eosinophils Absolute: 0.1 10*3/uL (ref 0.0–0.5)
Eosinophils Relative: 1 %
HCT: 45.7 % (ref 39.0–52.0)
Hemoglobin: 16.1 g/dL (ref 13.0–17.0)
Immature Granulocytes: 1 %
Lymphocytes Relative: 36 %
Lymphs Abs: 2.5 10*3/uL (ref 0.7–4.0)
MCH: 35.4 pg — ABNORMAL HIGH (ref 26.0–34.0)
MCHC: 35.2 g/dL (ref 30.0–36.0)
MCV: 100.4 fL — ABNORMAL HIGH (ref 80.0–100.0)
Monocytes Absolute: 0.4 10*3/uL (ref 0.1–1.0)
Monocytes Relative: 6 %
Neutro Abs: 3.8 10*3/uL (ref 1.7–7.7)
Neutrophils Relative %: 55 %
Platelets: 113 10*3/uL — ABNORMAL LOW (ref 150–400)
RBC: 4.55 MIL/uL (ref 4.22–5.81)
RDW: 13.6 % (ref 11.5–15.5)
WBC: 6.8 10*3/uL (ref 4.0–10.5)
nRBC: 0 % (ref 0.0–0.2)

## 2022-03-05 LAB — URINALYSIS, ROUTINE W REFLEX MICROSCOPIC
Bilirubin Urine: NEGATIVE
Glucose, UA: NEGATIVE mg/dL
Hgb urine dipstick: NEGATIVE
Ketones, ur: NEGATIVE mg/dL
Leukocytes,Ua: NEGATIVE
Nitrite: NEGATIVE
Protein, ur: 30 mg/dL — AB
Specific Gravity, Urine: >1.046 — ABNORMAL HIGH (ref 1.005–1.030)
pH: 6 (ref 5.0–8.0)

## 2022-03-05 LAB — BASIC METABOLIC PANEL
Anion gap: 11 (ref 5–15)
BUN: 16 mg/dL (ref 6–20)
CO2: 20 mmol/L — ABNORMAL LOW (ref 22–32)
Calcium: 8.3 mg/dL — ABNORMAL LOW (ref 8.9–10.3)
Chloride: 104 mmol/L (ref 98–111)
Creatinine, Ser: 1.17 mg/dL (ref 0.61–1.24)
GFR, Estimated: >60 mL/min (ref >60)
Glucose, Bld: 124 mg/dL — ABNORMAL HIGH (ref 70–99)
Potassium: 3.9 mmol/L (ref 3.5–5.1)
Sodium: 135 mmol/L (ref 135–145)

## 2022-03-05 LAB — COMPREHENSIVE METABOLIC PANEL
ALT: 40 U/L (ref 0–44)
AST: 48 U/L — ABNORMAL HIGH (ref 15–41)
Albumin: 4.5 g/dL (ref 3.5–5.0)
Alkaline Phosphatase: 60 U/L (ref 38–126)
Anion gap: 11 (ref 5–15)
BUN: 15 mg/dL (ref 6–20)
CO2: 21 mmol/L — ABNORMAL LOW (ref 22–32)
Calcium: 8.5 mg/dL — ABNORMAL LOW (ref 8.9–10.3)
Chloride: 105 mmol/L (ref 98–111)
Creatinine, Ser: 1.14 mg/dL (ref 0.61–1.24)
GFR, Estimated: >60 mL/min (ref >60)
Glucose, Bld: 152 mg/dL — ABNORMAL HIGH (ref 70–99)
Potassium: 3.6 mmol/L (ref 3.5–5.1)
Sodium: 137 mmol/L (ref 135–145)
Total Bilirubin: 0.7 mg/dL (ref 0.3–1.2)
Total Protein: 8 g/dL (ref 6.5–8.1)

## 2022-03-05 LAB — TROPONIN I (HIGH SENSITIVITY)
Troponin I (High Sensitivity): 3 ng/L (ref <18)
Troponin I (High Sensitivity): 4 ng/L (ref <18)

## 2022-03-05 LAB — CBC
HCT: 43.8 % (ref 39.0–52.0)
Hemoglobin: 15.1 g/dL (ref 13.0–17.0)
MCH: 34.8 pg — ABNORMAL HIGH (ref 26.0–34.0)
MCHC: 34.5 g/dL (ref 30.0–36.0)
MCV: 100.9 fL — ABNORMAL HIGH (ref 80.0–100.0)
Platelets: 111 10*3/uL — ABNORMAL LOW (ref 150–400)
RBC: 4.34 MIL/uL (ref 4.22–5.81)
RDW: 13.6 % (ref 11.5–15.5)
WBC: 6.2 10*3/uL (ref 4.0–10.5)
nRBC: 0 % (ref 0.0–0.2)

## 2022-03-05 LAB — LIPASE, BLOOD: Lipase: 39 U/L (ref 11–51)

## 2022-03-05 LAB — ETHANOL: Alcohol, Ethyl (B): 333 mg/dL (ref <10)

## 2022-03-05 MED ORDER — IOHEXOL 300 MG/ML  SOLN
100.0000 mL | Freq: Once | INTRAMUSCULAR | Status: AC | PRN
  Administered 2022-03-05: 100 mL via INTRAVENOUS

## 2022-03-05 MED ORDER — KETOROLAC TROMETHAMINE 10 MG PO TABS
10.0000 mg | ORAL_TABLET | Freq: Four times a day (QID) | ORAL | 0 refills | Status: AC | PRN
  Filled 2022-03-05: qty 20, 5d supply, fill #0

## 2022-03-05 MED ORDER — KETOROLAC TROMETHAMINE 15 MG/ML IJ SOLN
15.0000 mg | Freq: Once | INTRAMUSCULAR | Status: AC
  Administered 2022-03-05: 15 mg via INTRAVENOUS
  Filled 2022-03-05: qty 1

## 2022-03-05 MED ORDER — SODIUM CHLORIDE 0.9 % IV BOLUS
1000.0000 mL | Freq: Once | INTRAVENOUS | Status: AC
  Administered 2022-03-05: 1000 mL via INTRAVENOUS

## 2022-03-05 MED ORDER — LACTATED RINGERS IV BOLUS
1000.0000 mL | Freq: Once | INTRAVENOUS | Status: AC
  Administered 2022-03-05: 1000 mL via INTRAVENOUS

## 2022-03-05 MED ORDER — HYDROMORPHONE HCL 1 MG/ML IJ SOLN
1.0000 mg | Freq: Once | INTRAMUSCULAR | Status: AC
  Administered 2022-03-05: 1 mg via INTRAVENOUS
  Filled 2022-03-05: qty 1

## 2022-03-05 MED ORDER — ONDANSETRON HCL 4 MG/2ML IJ SOLN
4.0000 mg | Freq: Once | INTRAMUSCULAR | Status: AC
  Administered 2022-03-05: 4 mg via INTRAVENOUS
  Filled 2022-03-05: qty 2

## 2022-03-05 MED ORDER — LIDOCAINE VISCOUS HCL 2 % MT SOLN
15.0000 mL | Freq: Once | OROMUCOSAL | Status: AC
  Administered 2022-03-05: 15 mL via ORAL
  Filled 2022-03-05: qty 15

## 2022-03-05 MED ORDER — ONDANSETRON 8 MG PO TBDP
8.0000 mg | ORAL_TABLET | Freq: Three times a day (TID) | ORAL | 0 refills | Status: DC | PRN
  Filled 2022-03-05: qty 20, 7d supply, fill #0

## 2022-03-05 MED ORDER — ALUM & MAG HYDROXIDE-SIMETH 200-200-20 MG/5ML PO SUSP
30.0000 mL | Freq: Once | ORAL | Status: AC
  Administered 2022-03-05: 30 mL via ORAL
  Filled 2022-03-05: qty 30

## 2022-03-05 MED ORDER — LORAZEPAM 2 MG/ML IJ SOLN
2.0000 mg | Freq: Once | INTRAMUSCULAR | Status: AC
  Administered 2022-03-05: 2 mg via INTRAVENOUS
  Filled 2022-03-05: qty 1

## 2022-03-05 MED ORDER — TAMSULOSIN HCL 0.4 MG PO CAPS
0.4000 mg | ORAL_CAPSULE | Freq: Every day | ORAL | 0 refills | Status: DC
  Filled 2022-03-05: qty 30, 30d supply, fill #0

## 2022-03-05 MED ORDER — ONDANSETRON 4 MG PO TBDP: 4.0000 mg | ORAL_TABLET | Freq: Three times a day (TID) | ORAL | 0 refills | Status: DC | PRN

## 2022-03-05 NOTE — ED Triage Notes (Signed)
Pt arrived via EMS, pt having anxiety attack on EMS arrival.

## 2022-03-05 NOTE — Discharge Instructions (Addendum)
Please take the Toradol for 6 hours as needed for severe pain.  You can also take Tylenol for breakthrough pain.  Take Zofran as needed for nausea.  Take the Flomax in the evenings with food and water.  Follow-up with urology for the stone.  Information above to call and schedule an appointment.  Return to the ED for fevers, intractable pain.

## 2022-03-05 NOTE — ED Provider Notes (Signed)
[facility] DEPT Provider Note   CSN: [id number] Arrival date & time: 03/05/22  1127     History  Chief Complaint  Patient presents with   Abdominal Pain    Curtez Brallier is a 46 y.o. male.   Mental Health Problem    Patient with history of hypertension, migraines, eczema, pancreatitis, alcohol use disorder, alcoholic pancreatitis, thrombocytopenia, alcoholic cirrhosis presents today due to abdominal pain and chest pain.  It started yesterday, the pain is in the epigastric area with radiation to his left arm and his back.  Endorses nausea and vomiting, denies any hematemesis.  Is like previous episodes pancreatitis.  Alcohol use earlier today.  Denies SI, HI, hallucinations, delusions, thoughts of self-harm.  Home Medications Prior to Admission medications   Medication Sig Start Date End Date Taking? Authorizing Provider  fluticasone (FLONASE) 50 MCG/ACT nasal spray Place 2 sprays into both nostrils daily. 12/29/21  Yes Brunetta Jeans, PA-C  gabapentin (NEURONTIN) 300 MG capsule TAKE 1 CAPSULE (300 MG TOTAL) BY MOUTH 3 (THREE) TIMES DAILY. Patient taking differently: Take 300 mg by mouth 3 (three) times daily. 11/17/21 11/17/22 Yes Charlott Rakes, MD  ketorolac (TORADOL) 10 MG tablet Take 1 tablet (10 mg total) by mouth every 6 (six) hours as needed. 03/05/22  Yes Sherrill Raring, PA-C  lisinopril (ZESTRIL) 2.5 MG tablet Take 1 tablet (2.5 mg total) by mouth daily. 02/27/22  Yes Charlott Rakes, MD  Multiple Vitamin (MULTIVITAMIN PO) Take 1 tablet by mouth daily.   Yes [provider]  naltrexone (DEPADE) 50 MG tablet Take 1 tablet (50 mg total) by mouth daily. 02/14/22  Yes Charlott Rakes, MD  omeprazole (PRILOSEC) 40 MG capsule Take 1 capsule (40 mg total) by mouth daily. 02/14/22  Yes Newlin, Charlane Ferretti, MD  ondansetron (ZOFRAN-ODT) 8 MG disintegrating tablet Take 1 tablet (8 mg total) by mouth every 8 (eight) hours as needed for nausea or  vomiting. 03/05/22  Yes Sherrill Raring, PA-C  tamsulosin (FLOMAX) 0.4 MG CAPS capsule Take 1 capsule (0.4 mg total) by mouth daily after supper. 03/05/22  Yes Sherrill Raring, PA-C  diclofenac (VOLTAREN) 75 MG EC tablet Take 1 tablet (75 mg total) by mouth 2 (two) times daily. Patient not taking: Reported on 02/14/2022 02/05/22   Fransico Meadow, PA-C  Glecaprevir-Pibrentasvir (MAVYRET) 100-40 MG TABS Take 3 tablets by mouth daily with breakfast. Patient not taking: Reported on 01/11/2022 12/01/19   Kuppelweiser, Cassie L, RPH-CPP  potassium chloride SA (KLOR-CON) 20 MEQ tablet TAKE 1 TABLET (20 MEQ TOTAL) BY MOUTH DAILY. Patient taking differently: Take 20 mEq by mouth daily. 01/15/20 01/14/21  Argentina Donovan, PA-C  sucralfate (CARAFATE) 1 g tablet Take 1 tablet (1 g total) by mouth 4 (four) times daily -  with meals and at bedtime. Patient not taking: Reported on 02/14/2022 11/17/21   Charlott Rakes, MD      Allergies    Mushroom extract complex    Review of Systems   Review of Systems  Physical Exam Updated Vital Signs BP 120/76 (BP Location: Right Arm)   Pulse 89   Temp 98.5 F (36.9 C) (Oral)   Resp 16   SpO2 95%  Physical Exam Vitals and nursing note reviewed. Exam conducted with a chaperone present.  Constitutional:      Appearance: Normal appearance.  HENT:     Head: Normocephalic and atraumatic.  Eyes:     General: No scleral icterus.       Right eye: No discharge.  Left eye: No discharge.     Extraocular Movements: Extraocular movements intact.     Pupils: Pupils are equal, round, and reactive to light.  Cardiovascular:     Rate and Rhythm: Regular rhythm. Tachycardia present.     Pulses: Normal pulses.     Heart sounds: Normal heart sounds. No murmur heard.    No friction rub. No gallop.     Comments: Mildly tachycardic.  Upper and lower extremity pulses 2+ symmetric bilaterally. Pulmonary:     Effort: Pulmonary effort is normal. No respiratory distress.     Breath  sounds: Normal breath sounds.  Abdominal:     General: Abdomen is flat. Bowel sounds are normal. There is no distension.     Palpations: Abdomen is soft.     Tenderness: There is abdominal tenderness in the epigastric area.  Skin:    General: Skin is warm and dry.     Coloration: Skin is not jaundiced.  Neurological:     Mental Status: He is alert. Mental status is at baseline.     Coordination: Coordination normal.     ED Results / Procedures / Treatments   Labs (all labs ordered are listed, but only abnormal results are displayed) Labs Reviewed  COMPREHENSIVE METABOLIC PANEL - Abnormal; Notable for the following components:      Result Value   CO2 21 (*)    Glucose, Bld 152 (*)    Calcium 8.5 (*)    AST 48 (*)    All other components within normal limits  CBC WITH DIFFERENTIAL/PLATELET - Abnormal; Notable for the following components:   MCV 100.4 (*)    MCH 35.4 (*)    Platelets 113 (*)    All other components within normal limits  ETHANOL - Abnormal; Notable for the following components:   Alcohol, Ethyl (B) 333 (*)    All other components within normal limits  URINALYSIS, ROUTINE W REFLEX MICROSCOPIC - Abnormal; Notable for the following components:   Specific Gravity, Urine >1.046 (*)    Protein, ur 30 (*)    Bacteria, UA RARE (*)    All other components within normal limits  URINE CULTURE  LIPASE, BLOOD  TROPONIN I (HIGH SENSITIVITY)    EKG EKG Interpretation  Date/Time:  Sunday March 05 2022 12:16:15 EST Ventricular Rate:  109 PR Interval:  171 QRS Duration: 104 QT Interval:  333 QTC Calculation: 449 R Axis:   57 Text Interpretation: Sinus tachycardia Low voltage, precordial leads Rate faster Confirmed by Glynn Octave [phone]) on 03/05/2022 12:45:55 PM  Radiology CT Abdomen Pelvis W Contrast  Result Date: 03/05/2022 CLINICAL DATA:  Acute pancreatitis.  Abdominal pain. EXAM: CT ABDOMEN AND PELVIS WITH CONTRAST TECHNIQUE: Multidetector CT imaging of  the abdomen and pelvis was performed using the standard protocol following bolus administration of intravenous contrast. RADIATION DOSE REDUCTION: This exam was performed according to the departmental dose-optimization program which includes automated exposure control, adjustment of the mA and/or kV according to patient size and/or use of iterative reconstruction technique. CONTRAST:  OMNIPAQUE IOHEXOL 300 MG/ML  SOLN COMPARISON:  01/11/2022 FINDINGS: Lower Chest: No acute findings. Hepatobiliary: No hepatic masses identified. Severe diffuse hepatic steatosis is again demonstrated. Gallbladder is unremarkable. No evidence of biliary ductal dilatation. Pancreas: Previously seen peripancreatic inflammatory changes have resolved. No evidence of peripancreatic fluid collections. No evidence of pancreatic mass or ductal dilatation. Spleen: Within normal limits in size and appearance. Adrenals/Urinary Tract: No suspicious masses identified. 5 mm calculus is again seen  at the right UPJ, however there is no evidence of hydronephrosis. Unremarkable unopacified urinary bladder. Stomach/Bowel: No evidence of obstruction, inflammatory process or abnormal fluid collections. Normal appendix visualized. Vascular/Lymphatic: No pathologically enlarged lymph nodes. No acute vascular findings. Aortic atherosclerotic calcification incidentally noted. Reproductive:  No mass or other significant abnormality. Other:  None. Musculoskeletal: No suspicious bone lesions identified. Stable L2 vertebral body wedge compression deformity. IMPRESSION: Interval resolution of acute pancreatitis. No evidence of pseudocyst or other complication. 5 mm calculus at right UPJ, without evidence of hydronephrosis. Severe hepatic steatosis. Aortic Atherosclerosis (ICD10-I70.0). Electronically Signed   By: Danae Orleans M.D.   On: 03/05/2022 13:18   DG Chest 2 View  Result Date: 03/05/2022 CLINICAL DATA:  Shortness of breath, chest pain EXAM: CHEST -  2 VIEW COMPARISON:  09/12/2019 FINDINGS: Low lung volumes with bibasilar atelectasis. Heart is normal size. Mediastinal contours within normal limits. No acute bony abnormality. IMPRESSION: Low lung volumes, bibasilar atelectasis. Electronically Signed   By: Charlett Nose M.D.   On: 03/05/2022 12:14    Procedures Procedures    Medications Ordered in ED Medications  lactated ringers bolus 1,000 mL (0 mLs Intravenous Stopped 03/05/22 1347)  ondansetron (ZOFRAN) injection 4 mg (4 mg Intravenous Given 03/05/22 1218)  HYDROmorphone (DILAUDID) injection 1 mg (1 mg Intravenous Given 03/05/22 1218)  iohexol (OMNIPAQUE) 300 MG/ML solution 100 mL (100 mLs Intravenous Contrast Given 03/05/22 1229)  LORazepam (ATIVAN) injection 2 mg (2 mg Intravenous Given 03/05/22 1403)  ketorolac (TORADOL) 15 MG/ML injection 15 mg (15 mg Intravenous Given 03/05/22 1403)  alum & mag hydroxide-simeth (MAALOX/MYLANTA) 200-200-20 MG/5ML suspension 30 mL (30 mLs Oral Given 03/05/22 1428)    And  lidocaine (XYLOCAINE) 2 % viscous mouth solution 15 mL (15 mLs Oral Given 03/05/22 1429)    ED Course/ Medical Decision Making/ A&P Clinical Course as of 03/05/22 1623  Sun Mar 05, 2022  1241 DG Chest 2 View I viewed and interpreted chest x-ray.  Atelectasis but no pneumonia, pneumothorax or cardiomegaly.  I agree with radiologist interpretation. [HS]  1242 EKG shows sinus tachycardia with a rate of 109.  No specific ischemic changes, roughly unchanged compared to previous EKGs per my interpretation.  Patient is on cardiac monitoring in sinus tachycardia per my interpretation.  Rate about 100-105 [HS]  1300 CBC with Differential(!) No leukocytosis or anemia.  Macrocytic cbcs and thrombocytopenia consistent with chronic alcohol use [HS]  1409 CT Abdomen Pelvis W Contrast 5 mm stone at right UPJ.  No hydronephrosis, no signs of pancreatitis reassuringly. [HS]  1414 Patient complaining his pain feels like reflux.  Requesting GI cocktail  which I have ordered.  Pain not improved after Dilaudid on reevaluation, Toradol ordered. [HS]  1437 Comprehensive metabolic panel(!) No gross electrolyte derangement or AKI.  No transaminitis.  AST is elevated at 48 consistent with alcohol use disorder and this is roughly baseline per chart review [HS]  1438 Troponin I (High Sensitivity) Mild troponin and no ischemic changes on EKG.  Not consistent with ACS [HS]  1438 Lipase, blood Lipase is within normal limits, not pancreatitis [HS]  1438 Urinalysis, Routine w reflex microscopic Urine, Clean Catch(!) No signs of infection in the UA.  Some slight proteinuria. [HS]  1439 I reevaluated the patient.  Discussed work-up with patient.  Discussed that I suspect stone will pass on its own and there is no signs of infection that be suggestive of septic stone.  Patient states his pain is not improved.  He still feels  nauseated.  He is also very concerned about his alcohol use disorder and is concerned that if he were to go home he would deteriorate because he is unable to stop drinking.  Given his alcohol use disorder I am somewhat hesitant to send the patient home with narcotics on top of knowing he is likely to keep drinking.  Patient is requesting to be admitted.  I will consult hospitalist to see if this is a possibility in the setting of intractable pain, nausea and vomiting in the setting of a 5 mm stone. [HS]  1452 I consulted with urology who agreed from a urologic perspective no intervention indicated.  Agree with Toradol, Flomax, Zofran and strict return precautions with urology outpatient follow-up.  Also requesting we give the patient a strainer for urine. [HS]  1452 I consulted with hospitalist service.  There is a very kind and understanding of his alcohol disorder but from a hospitalist perspective patient does not meet any criteria for admission.  Not in withdrawal, nonseptic and urology will not do any intervention. [HS]    Clinical Course User  Index [HS] Theron Arista, PA-C                           Medical Decision Making Amount and/or Complexity of Data Reviewed Labs: ordered. Decision-making details documented in ED Course. Radiology: ordered. Decision-making details documented in ED Course.  Risk OTC drugs. Prescription drug management.   Patient presents for abdominal pain, chest pain and flank pain.  Differential is broad and includes pancreatitis, ACS, nephrolithiasis, alcohol withdrawal, intoxication, septic stone, UTI, pyelonephritis.  I ordered, reviewed and interpreted laboratory work-up as documented in ED course.  I ordered, reviewed and interpreted imaging studies as documented in the ED course.  I agree with radiologist interpretation.  I ordered medications including Ativan, Zofran, fluid bolus, GI cocktail, Dilaudid, Toradol.  I consulted urology the hospital service as documented ED course.  Ultimately although I did consider admission I do not think patient meets criteria and that sentiment was reflected by hospitalist service as well as the urology service.  Discussed results with the patient as well as very strict return precautions.  All questions were answered, patient stable for discharge at this time.          Final Clinical Impression(s) / ED Diagnoses Final diagnoses:  Nephrolithiasis    Rx / DC Orders ED Discharge Orders          Ordered    ondansetron (ZOFRAN-ODT) 8 MG disintegrating tablet  Every 8 hours PRN        03/05/22 1508    ketorolac (TORADOL) 10 MG tablet  Every 6 hours PRN        03/05/22 1508    tamsulosin (FLOMAX) 0.4 MG CAPS capsule  Daily after supper        03/05/22 1508              Theron Arista, PA-C 03/05/22 1623    Glynn Octave, MD 03/05/22 1658

## 2022-03-05 NOTE — Discharge Instructions (Signed)
Medications (Flomax, Toradol, Zofran) sent to your [facility] earlier today. Please take as previously prescribed. Rx for Zofran sent to 24 hour pharmacy tonight, take as needed as prescribed for vomiting.   Follow-up with urology, call to schedule an appointment.

## 2022-03-05 NOTE — ED Provider Notes (Incomplete)
Patrick [name] Provider Note   CSN: [id number] Arrival date & time: 03/05/22  1940     History {Add pertinent medical, surgical, social history, OB history to HPI:1} Chief Complaint  Patient presents with  . Flank Pain    Patrick [name] is a 46 y.o. male.  46 year old male with past medical history of pancreatitis secondary to alcohol use, cirrhosis, hypertension, tobacco use, marijuana use presents to the emergency room with ongoing left-sided chest pain which radiates down his left arm for the past 4 days associated with vomiting and feeling like his abdomen is swollen.  Patient was seen here earlier today for these same symptoms.  Had comprehensive work-up to include CT abdomen pelvis, troponin, EKG.  He was found to have a 5 mm proximal stone.  CT negative for pancreatitis, lipase normal.  Patient was consulted to urology who felt he could follow-up outpatient, consulted to hospitalist service for admission due to his ongoing pain with concern for his alcohol use, did not meet criteria for inpatient management and was ultimately discharged however left without his paperwork and was unaware he had scription's to pick up at the pharmacy.  His symptoms are unchanged this evening.  No other complaints or concerns.       Home Medications Prior to Admission medications   Medication Sig Start Date End Date Taking? Authorizing Provider  diclofenac (VOLTAREN) 75 MG EC tablet Take 1 tablet (75 mg total) by mouth 2 (two) times daily. Patient not taking: Reported on 02/14/2022 02/05/22   Fransico Meadow, PA-C  fluticasone [facility]) 50 MCG/ACT nasal spray Place 2 sprays into both nostrils daily. 12/29/21   Brunetta Jeans, PA-C  gabapentin (NEURONTIN) 300 MG capsule TAKE 1 CAPSULE (300 MG TOTAL) BY MOUTH 3 (THREE) TIMES DAILY. Patient taking differently: Take 300 mg by mouth 3 (three) times daily. 11/17/21 11/17/22  Charlott Rakes, MD  Glecaprevir-Pibrentasvir  (MAVYRET) 100-40 MG TABS Take 3 tablets by mouth daily with breakfast. Patient not taking: Reported on 01/11/2022 12/01/19   Kuppelweiser, Cassie L, RPH-CPP  ketorolac (TORADOL) 10 MG tablet Take 1 tablet (10 mg total) by mouth every 6 (six) hours as needed. 03/05/22   Sherrill Raring, PA-C  lisinopril (ZESTRIL) 2.5 MG tablet Take 1 tablet (2.5 mg total) by mouth daily. 02/27/22   Charlott Rakes, MD  Multiple Vitamin (MULTIVITAMIN PO) Take 1 tablet by mouth daily.    [provider]  naltrexone (DEPADE) 50 MG tablet Take 1 tablet (50 mg total) by mouth daily. 02/14/22   Charlott Rakes, MD  omeprazole (PRILOSEC) 40 MG capsule Take 1 capsule (40 mg total) by mouth daily. 02/14/22   Charlott Rakes, MD  ondansetron (ZOFRAN-ODT) 8 MG disintegrating tablet Take 1 tablet (8 mg total) by mouth every 8 (eight) hours as needed for nausea or vomiting. 03/05/22   Sherrill Raring, PA-C  potassium chloride SA (KLOR-CON) 20 MEQ tablet TAKE 1 TABLET (20 MEQ TOTAL) BY MOUTH DAILY. Patient taking differently: Take 20 mEq by mouth daily. 01/15/20 01/14/21  Argentina Donovan, PA-C  sucralfate (CARAFATE) 1 g tablet Take 1 tablet (1 g total) by mouth 4 (four) times daily -  with meals and at bedtime. Patient not taking: Reported on 02/14/2022 11/17/21   Charlott Rakes, MD  tamsulosin (FLOMAX) 0.4 MG CAPS capsule Take 1 capsule (0.4 mg total) by mouth daily after supper. 03/05/22   Sherrill Raring, PA-C      Allergies    Mushroom extract complex    Review  of Systems   Review of Systems Negative except as per HPI Physical Exam Updated Vital Signs BP (!) 152/113 (BP Location: Left Arm)   Pulse (!) 110   Temp 98.8 F (37.1 C) (Oral)   Resp 18   Ht 5\' 11"  (1.803 m)   Wt 95 kg   SpO2 96%   BMI 29.21 kg/m  Physical Exam Vitals and nursing note reviewed.  Constitutional:      General: He is not in acute distress.    Appearance: He is well-developed. He is not diaphoretic.     Comments: Smells of alcohol, has water  bottle/thermos in bed with him  HENT:     Head: Normocephalic and atraumatic.  Cardiovascular:     Rate and Rhythm: Regular rhythm. Tachycardia present.     Heart sounds: Normal heart sounds.  Pulmonary:     Effort: Pulmonary effort is normal.     Breath sounds: Normal breath sounds.  Chest:     Chest wall: Tenderness present.  Abdominal:     Palpations: Abdomen is soft.     Tenderness: There is abdominal tenderness.  Musculoskeletal:     Right lower leg: No edema.     Left lower leg: No edema.  Skin:    General: Skin is warm and dry.     Findings: No erythema or rash.  Neurological:     Mental Status: He is alert and oriented to person, place, and time.  Psychiatric:        Behavior: Behavior normal.     ED Results / Procedures / Treatments   Labs (all labs ordered are listed, but only abnormal results are displayed) Labs Reviewed  BASIC METABOLIC PANEL - Abnormal; Notable for the following components:      Result Value   CO2 20 (*)    Glucose, Bld 124 (*)    Calcium 8.3 (*)    All other components within normal limits  CBC - Abnormal; Notable for the following components:   MCV 100.9 (*)    MCH 34.8 (*)    Platelets 111 (*)    All other components within normal limits  TROPONIN I (HIGH SENSITIVITY)    EKG None  Radiology CT Abdomen Pelvis W Contrast  Result Date: 03/05/2022 CLINICAL DATA:  Acute pancreatitis.  Abdominal pain. EXAM: CT ABDOMEN AND PELVIS WITH CONTRAST TECHNIQUE: Multidetector CT imaging of the abdomen and pelvis was performed using the standard protocol following bolus administration of intravenous contrast. RADIATION DOSE REDUCTION: This exam was performed according to the departmental dose-optimization program which includes automated exposure control, adjustment of the mA and/or kV according to patient size and/or use of iterative reconstruction technique. CONTRAST:  13/08/2021 OMNIPAQUE IOHEXOL 300 MG/ML  SOLN COMPARISON:  01/11/2022 FINDINGS: Lower  Chest: No acute findings. Hepatobiliary: No hepatic masses identified. Severe diffuse hepatic steatosis is again demonstrated. Gallbladder is unremarkable. No evidence of biliary ductal dilatation. Pancreas: Previously seen peripancreatic inflammatory changes have resolved. No evidence of peripancreatic fluid collections. No evidence of pancreatic mass or ductal dilatation. Spleen: Within normal limits in size and appearance. Adrenals/Urinary Tract: No suspicious masses identified. 5 mm calculus is again seen at the right UPJ, however there is no evidence of hydronephrosis. Unremarkable unopacified urinary bladder. Stomach/Bowel: No evidence of obstruction, inflammatory process or abnormal fluid collections. Normal appendix visualized. Vascular/Lymphatic: No pathologically enlarged lymph nodes. No acute vascular findings. Aortic atherosclerotic calcification incidentally noted. Reproductive:  No mass or other significant abnormality. Other:  None. Musculoskeletal: No suspicious bone  lesions identified. Stable L2 vertebral body wedge compression deformity. IMPRESSION: Interval resolution of acute pancreatitis. No evidence of pseudocyst or other complication. 5 mm calculus at right UPJ, without evidence of hydronephrosis. Severe hepatic steatosis. Aortic Atherosclerosis (ICD10-I70.0). Electronically Signed   By: Danae Orleans M.D.   On: 03/05/2022 13:18   DG Chest 2 View  Result Date: 03/05/2022 CLINICAL DATA:  Shortness of breath, chest pain EXAM: CHEST - 2 VIEW COMPARISON:  09/12/2019 FINDINGS: Low lung volumes with bibasilar atelectasis. Heart is normal size. Mediastinal contours within normal limits. No acute bony abnormality. IMPRESSION: Low lung volumes, bibasilar atelectasis. Electronically Signed   By: Charlett Nose M.D.   On: 03/05/2022 12:14    Procedures Procedures  {Document cardiac monitor, telemetry assessment procedure when appropriate:1}  Medications Ordered in ED Medications  ketorolac  (TORADOL) 15 MG/ML injection 15 mg (has no administration in time range)  sodium chloride 0.9 % bolus 1,000 mL (has no administration in time range)  ondansetron (ZOFRAN) injection 4 mg (has no administration in time range)    ED Course/ Medical Decision Making/ A&P                           Medical Decision Making Risk Prescription drug management.   This patient presents to the ED for concern of chest pain and vomiting as above, this involves an extensive number of treatment options, and is a complaint that carries with it a high risk of complications and morbidity.  The differential diagnosis includes but not limited to ACS, msk pain, dissection, gastritis, GERD, kidney stone, pancreatitis.    Co morbidities that complicate the patient evaluation  Alcohol abuse, cirrhosis, pancreatitis, tobacco and marijuana use.    Additional history obtained:  External records from outside source obtained and reviewed including CT scan obtained earlier today showing 5 mm UPJ left side stone without hydro-.  Labs reviewed from visit this afternoon including CMP with mildly elevated AST at 48 otherwise no significant changes, normal renal function.  CBC with normal white count, alcohol elevated at 333, urinalysis with protein, no evidence of infection. Troponin 3   Lab Tests:  I Ordered, and personally interpreted labs.  The pertinent results include: CBC with normal WBC.  BMP with elevated glucose at 124, nonfasting, normal renal function.  Troponin 4, not significantly changed from prior earlier today.   Consultations Obtained:  I requested consultation with the ER attending, Dr. Lynelle Doctor,  and discussed lab and imaging findings as well as pertinent plan - they recommend: ***   Problem List / ED Course / Critical interventions / Medication management  *** I ordered medication including ***  for ***  Reevaluation of the patient after these medicines showed that the patient  {resolved/improved/worsened:23923::"improved"} I have reviewed the patients home medicines and have made adjustments as needed   Social Determinants of Health:  Has PCP, has been referred to GI and is awaiting appointment    Test / Admission - Considered:  Evaluation today for the second visit with same complaint, left without his papers and was unaware he had rx at his pharmacy. Patient intoxicated at that visit, I am concerned the water bottle in bed with him does not contain water and may contain alcohol. His BP is elevated, he is mildly tachycardic. Will provide antiemetics, IVF, toradol for his pain with plan for dc home. He was consulted already to urology and hospitalist today and can follow up with urology outpatient as  previously referred.    {Document critical care time when appropriate:1} {Document review of labs and clinical decision tools ie heart score, Chads2Vasc2 etc:1}  {Document your independent review of radiology images, and any outside records:1} {Document your discussion with family members, caretakers, and with consultants:1} {Document social determinants of health affecting pt's care:1} {Document your decision making why or why not admission, treatments were needed:1} Final Clinical Impression(s) / ED Diagnoses Final diagnoses:  None    Rx / DC Orders ED Discharge Orders     None

## 2022-03-05 NOTE — ED Provider Triage Note (Signed)
Emergency Medicine Provider Triage Evaluation Note  Patrick [name] , a 46 y.o. male  was evaluated in triage.  Pt complains of chest pain. Pt report he was just discharged from the hospital after being seen earlier today.  Was diagnosed with kidney stone.  Report now having pain to his chest along with L arm pain.  Endorse sob and "hurt all over".  Hx of alcohol use  Review of Systems  Positive: As above Negative: As above  Physical Exam  BP (!) 152/113 (BP Location: Left Arm)   Pulse (!) 110   Temp 98.8 F (37.1 C) (Oral)   Resp 18   Ht 5\' 11"  (1.803 m)   Wt 95 kg   SpO2 96%   BMI 29.21 kg/m  Gen:   Awake, no distress   Resp:  Normal effort  MSK:   Moves extremities without difficulty  Other:    Medical Decision Making  Medically screening exam initiated at 8:05 PM.  Appropriate orders placed.  Stratton Villwock was informed that the remainder of the evaluation will be completed by another provider, this initial triage assessment does not replace that evaluation, and the importance of remaining in the ED until their evaluation is complete.     Domenic Moras, PA-C 03/05/22 2006

## 2022-03-05 NOTE — ED Notes (Signed)
Patient transported to CT via stretcher.

## 2022-03-05 NOTE — ED Triage Notes (Signed)
Pt c/o flank pain. Was seen earlier for the same.

## 2022-03-05 NOTE — ED Notes (Addendum)
Pt refused VS, refused paperwork, swearing at this Probation officer.

## 2022-03-05 NOTE — ED Provider Notes (Signed)
[facility]-EMERGENCY DEPT Provider Note   CSN: [id number] Arrival date & time: 03/05/22  1940     History  Chief Complaint  Patient presents with   Flank Pain    Patrick [name] is a 46 y.o. male.  46 year old male with past medical history of pancreatitis secondary to alcohol use, cirrhosis, hypertension, tobacco use, marijuana use presents to the emergency room with ongoing left-sided chest pain which radiates down his left arm for the past 4 days associated with vomiting and feeling like his abdomen is swollen.  Patient was seen here earlier today for these same symptoms.  Had comprehensive work-up to include CT abdomen pelvis, troponin, EKG.  He was found to have a 5 mm proximal stone.  CT negative for pancreatitis, lipase normal.  Patient was consulted to urology who felt he could follow-up outpatient, consulted to hospitalist service for admission due to his ongoing pain with concern for his alcohol use, did not meet criteria for inpatient management and was ultimately discharged however left without his paperwork and was unaware he had scription's to pick up at the pharmacy.  His symptoms are unchanged this evening.  No other complaints or concerns.       Home Medications Prior to Admission medications   Medication Sig Start Date End Date Taking? Authorizing Provider  fluticasone (FLONASE) 50 MCG/ACT nasal spray Place 2 sprays into both nostrils daily. 12/29/21  Yes Waldon Merl, PA-C  gabapentin (NEURONTIN) 300 MG capsule TAKE 1 CAPSULE (300 MG TOTAL) BY MOUTH 3 (THREE) TIMES DAILY. Patient taking differently: Take 300 mg by mouth 3 (three) times daily. 11/17/21 11/17/22 Yes Hoy Register, MD  ketorolac (TORADOL) 10 MG tablet Take 1 tablet (10 mg total) by mouth every 6 (six) hours as needed. 03/05/22  Yes Theron Arista, PA-C  lisinopril (ZESTRIL) 2.5 MG tablet Take 1 tablet (2.5 mg total) by mouth daily. 02/27/22  Yes Hoy Register, MD  Multiple Vitamin  (MULTIVITAMIN PO) Take 1 tablet by mouth daily.   Yes [provider]  naltrexone (DEPADE) 50 MG tablet Take 1 tablet (50 mg total) by mouth daily. 02/14/22  Yes Hoy Register, MD  omeprazole (PRILOSEC) 40 MG capsule Take 1 capsule (40 mg total) by mouth daily. 02/14/22  Yes Newlin, Odette Horns, MD  ondansetron (ZOFRAN-ODT) 4 MG disintegrating tablet Take 1 tablet (4 mg total) by mouth every 8 (eight) hours as needed for nausea or vomiting. 03/05/22  Yes Jeannie Fend, PA-C  ondansetron (ZOFRAN-ODT) 8 MG disintegrating tablet Take 1 tablet (8 mg total) by mouth every 8 (eight) hours as needed for nausea or vomiting. 03/05/22  Yes Theron Arista, PA-C  tamsulosin (FLOMAX) 0.4 MG CAPS capsule Take 1 capsule (0.4 mg total) by mouth daily after supper. 03/05/22  Yes Theron Arista, PA-C  diclofenac (VOLTAREN) 75 MG EC tablet Take 1 tablet (75 mg total) by mouth 2 (two) times daily. Patient not taking: Reported on 02/14/2022 02/05/22   Elson Areas, PA-C  Glecaprevir-Pibrentasvir (MAVYRET) 100-40 MG TABS Take 3 tablets by mouth daily with breakfast. Patient not taking: Reported on 01/11/2022 12/01/19   Kuppelweiser, Cassie L, RPH-CPP  potassium chloride SA (KLOR-CON) 20 MEQ tablet TAKE 1 TABLET (20 MEQ TOTAL) BY MOUTH DAILY. Patient taking differently: Take 20 mEq by mouth daily. 01/15/20 01/14/21  Anders Simmonds, PA-C  sucralfate (CARAFATE) 1 g tablet Take 1 tablet (1 g total) by mouth 4 (four) times daily -  with meals and at bedtime. Patient not taking: Reported on  02/14/2022 11/17/21   Hoy Register, MD      Allergies    Mushroom extract complex    Review of Systems   Review of Systems Negative except as per HPI Physical Exam Updated Vital Signs BP (!) 152/107   Pulse (!) 106   Temp 98.8 F (37.1 C)   Resp 16   Ht 5\' 11"  (1.803 m)   Wt 95 kg   SpO2 95%   BMI 29.21 kg/m  Physical Exam Vitals and nursing note reviewed.  Constitutional:      General: He is not in acute distress.     Appearance: He is well-developed. He is not diaphoretic.     Comments: Smells of alcohol, has water bottle/thermos in bed with him  HENT:     Head: Normocephalic and atraumatic.  Cardiovascular:     Rate and Rhythm: Regular rhythm. Tachycardia present.     Heart sounds: Normal heart sounds.  Pulmonary:     Effort: Pulmonary effort is normal.     Breath sounds: Normal breath sounds.  Chest:     Chest wall: Tenderness present.  Abdominal:     Palpations: Abdomen is soft.     Tenderness: There is abdominal tenderness.  Musculoskeletal:     Right lower leg: No edema.     Left lower leg: No edema.  Skin:    General: Skin is warm and dry.     Findings: No erythema or rash.  Neurological:     Mental Status: He is alert and oriented to person, place, and time.  Psychiatric:        Behavior: Behavior normal.     ED Results / Procedures / Treatments   Labs (all labs ordered are listed, but only abnormal results are displayed) Labs Reviewed  BASIC METABOLIC PANEL - Abnormal; Notable for the following components:      Result Value   CO2 20 (*)    Glucose, Bld 124 (*)    Calcium 8.3 (*)    All other components within normal limits  CBC - Abnormal; Notable for the following components:   MCV 100.9 (*)    MCH 34.8 (*)    Platelets 111 (*)    All other components within normal limits  TROPONIN I (HIGH SENSITIVITY)    EKG EKG Interpretation  Date/Time:  Sunday March 05 2022 20:11:27 EST Ventricular Rate:  110 PR Interval:  161 QRS Duration: 103 QT Interval:  333 QTC Calculation: 451 R Axis:   63 Text Interpretation: Sinus tachycardia Confirmed by 09-07-2001 [id number]) on 03/05/2022 11:46:30 PM  Radiology CT Abdomen Pelvis W Contrast  Result Date: 03/05/2022 CLINICAL DATA:  Acute pancreatitis.  Abdominal pain. EXAM: CT ABDOMEN AND PELVIS WITH CONTRAST TECHNIQUE: Multidetector CT imaging of the abdomen and pelvis was performed using the standard protocol following bolus  administration of intravenous contrast. RADIATION DOSE REDUCTION: This exam was performed according to the departmental dose-optimization program which includes automated exposure control, adjustment of the mA and/or kV according to patient size and/or use of iterative reconstruction technique. CONTRAST:  13/08/2021 OMNIPAQUE IOHEXOL 300 MG/ML  SOLN COMPARISON:  01/11/2022 FINDINGS: Lower Chest: No acute findings. Hepatobiliary: No hepatic masses identified. Severe diffuse hepatic steatosis is again demonstrated. Gallbladder is unremarkable. No evidence of biliary ductal dilatation. Pancreas: Previously seen peripancreatic inflammatory changes have resolved. No evidence of peripancreatic fluid collections. No evidence of pancreatic mass or ductal dilatation. Spleen: Within normal limits in size and appearance. Adrenals/Urinary Tract: No suspicious masses identified. 5  mm calculus is again seen at the right UPJ, however there is no evidence of hydronephrosis. Unremarkable unopacified urinary bladder. Stomach/Bowel: No evidence of obstruction, inflammatory process or abnormal fluid collections. Normal appendix visualized. Vascular/Lymphatic: No pathologically enlarged lymph nodes. No acute vascular findings. Aortic atherosclerotic calcification incidentally noted. Reproductive:  No mass or other significant abnormality. Other:  None. Musculoskeletal: No suspicious bone lesions identified. Stable L2 vertebral body wedge compression deformity. IMPRESSION: Interval resolution of acute pancreatitis. No evidence of pseudocyst or other complication. 5 mm calculus at right UPJ, without evidence of hydronephrosis. Severe hepatic steatosis. Aortic Atherosclerosis (ICD10-I70.0). Electronically Signed   By: Danae Orleans M.D.   On: 03/05/2022 13:18   DG Chest 2 View  Result Date: 03/05/2022 CLINICAL DATA:  Shortness of breath, chest pain EXAM: CHEST - 2 VIEW COMPARISON:  09/12/2019 FINDINGS: Low lung volumes with bibasilar  atelectasis. Heart is normal size. Mediastinal contours within normal limits. No acute bony abnormality. IMPRESSION: Low lung volumes, bibasilar atelectasis. Electronically Signed   By: Charlett Nose M.D.   On: 03/05/2022 12:14    Procedures Procedures    Medications Ordered in ED Medications  ketorolac (TORADOL) 15 MG/ML injection 15 mg (15 mg Intravenous Given 03/05/22 2316)  sodium chloride 0.9 % bolus 1,000 mL (1,000 mLs Intravenous New Bag/Given 03/05/22 2316)  ondansetron (ZOFRAN) injection 4 mg (4 mg Intravenous Given 03/05/22 2316)    ED Course/ Medical Decision Making/ A&P                           Medical Decision Making Risk Prescription drug management.   This patient presents to the ED for concern of chest pain and vomiting as above, this involves an extensive number of treatment options, and is a complaint that carries with it a high risk of complications and morbidity.  The differential diagnosis includes but not limited to ACS, msk pain, dissection, gastritis, GERD, kidney stone, pancreatitis.    Co morbidities that complicate the patient evaluation  Alcohol abuse, cirrhosis, pancreatitis, tobacco and marijuana use.    Additional history obtained:  External records from outside source obtained and reviewed including CT scan obtained earlier today showing 5 mm UPJ left side stone without hydro-.  Labs reviewed from visit this afternoon including CMP with mildly elevated AST at 48 otherwise no significant changes, normal renal function.  CBC with normal white count, alcohol elevated at 333, urinalysis with protein, no evidence of infection. Troponin 3   Lab Tests:  I Ordered, and personally interpreted labs.  The pertinent results include: CBC with normal WBC.  BMP with elevated glucose at 124, nonfasting, normal renal function.  Troponin 4, not significantly changed from prior earlier today.  EKG Sinus tach, rate 110   Consultations Obtained:  I requested  consultation with the ER attending, Dr. Lynelle Doctor,  and discussed lab and imaging findings as well as pertinent plan - they recommend: agree with plan for dc to follow up with urology.    Problem List / ED Course / Critical interventions / Medication management  46 year old male presents emergency room with complaint of left-sided chest pain radiating down left arm as well as back pain.  States similar to his prior pancreatitis episodes, feels like his abdomen is distended.  Patient was seen earlier today for same, ACS ruled out, diagnosed with left 5 mill proximal ureteral stone.  No fever, no UTI, normal renal function.  Pain was not controlled with Ativan, Dilaudid, Toradol.  Urology was consulted, felt appropriate for outpatient follow-up.  Hospitalist was consulted due to alcohol history and concern for managing pain outpatient, felt not to meet criteria for inpatient admission.  Ultimately, patient left without his discharge papers, was unaware that he had prescriptions to pick up including Flomax, Zofran, Toradol.  Returns with above complaint.  Found to have generalized abdominal discomfort, no CVA tenderness.  He is mildly tachycardic and hypertensive.  He has a bottle of liquid in bed with him, question if this is alcohol, did have elevated alcohol level earlier visit today.  Labs are unchanged compared to earlier today.  EKG without ischemic changes.  Heart rate improved with IV fluids.  Patient is able to tolerate p.o.'s.  He is discharged with instructions to pick up his prior prescriptions, current pharmacy is closed, will send Zofran to 24-hour pharmacy.  He is provided with referral to urology for follow-up. I ordered medication including zofran, toradol, IVF  for pain, vomiting  Reevaluation of the patient after these medicines showed that the patient improved- no longer vomiting, has tolerated sandwich I have reviewed the patients home medicines and have made adjustments as needed   Social  Determinants of Health:  Has PCP, has been referred to GI and is awaiting appointment    Test / Admission - Considered:  Evaluation today for the second visit with same complaint, left without his papers and was unaware he had rx at his pharmacy. Patient intoxicated at that visit, I am concerned the water bottle in bed with him does not contain water and may contain alcohol. His BP is elevated, he is mildly tachycardic. Will provide antiemetics, IVF, toradol for his pain with plan for dc home. He was consulted already to urology and hospitalist today and can follow up with urology outpatient as previously referred.          Final Clinical Impression(s) / ED Diagnoses Final diagnoses:  Chest pain, unspecified type    Rx / DC Orders ED Discharge Orders          Ordered    ondansetron (ZOFRAN-ODT) 4 MG disintegrating tablet  Every 8 hours PRN        03/05/22 2312              Tacy Learn, PA-C 03/06/22 0018    Dorie Rank, MD 03/08/22 1529

## 2022-03-06 ENCOUNTER — Other Ambulatory Visit: Payer: Self-pay

## 2022-03-06 ENCOUNTER — Ambulatory Visit: Payer: Self-pay

## 2022-03-06 LAB — URINE CULTURE: Culture: NO GROWTH

## 2022-03-06 NOTE — Telephone Encounter (Signed)
Left message on voicemail to return call - has he taken Tamsulosin? -Urine cx has not resulted  Advise to take Tamsulosin, follow with Mobile Unit tomorrow for evaluation. Give location.

## 2022-03-06 NOTE — Telephone Encounter (Signed)
  Chief Complaint: Abdominal pain Symptoms: pain of 13-14/10 Frequency: Ongoing Pertinent Negatives: Patient denies  Disposition: [x] ED /[] Urgent Care (no appt availability in office) / [] Appointment(In office/virtual)/ []  [facility] [facility] [facility]/ [] Home Care/ [] Refused Recommended Disposition /[]  Mobile Bus/ []  Follow-up with PCP Additional Notes: Pt has been to ED 2 times yesterday. Pain continues. Pt also states that sometimes he feels like he needs to urinate, but only has a few drops come out.     Reason for Disposition  [1] Unable to urinate (or only a few drops) > 4 hours AND [2] bladder feels very full (e.g., palpable bladder or strong urge to urinate)  [1] SEVERE pain (e.g., excruciating) AND [2] present > 1 hour  Answer Assessment - Initial Assessment Questions 1. LOCATION: "Where does it hurt?"      Abdomen, chest 2. RADIATION: "Does the pain shoot anywhere else?" (e.g., chest, back)     Abdominal pain 3. ONSET: "When did the pain begin?" (Minutes, hours or days ago)       4. SUDDEN: "Gradual or sudden onset?"      5. PATTERN "Does the pain come and go, or is it constant?"    - If it comes and goes: "How long does it last?" "Do you have pain now?"     (Note: Comes and goes means the pain is intermittent. It goes away completely between bouts.)    - If constant: "Is it getting better, staying the same, or getting worse?"      (Note: Constant means the pain never goes away completely; most serious pain is constant and gets worse.)      constant 6. SEVERITY: "How bad is the pain?"  (e.g., Scale 1-10; mild, moderate, or severe)    - MILD (1-3): Doesn't interfere with normal activities, abdomen soft and not tender to touch.     - MODERATE (4-7): Interferes with normal activities or awakens from sleep, abdomen tender to touch.     - SEVERE (8-10): Excruciating pain, doubled over, unable to do any normal activities.       13-14/10 7. RECURRENT SYMPTOM: "Have you ever  had this type of stomach pain before?" If Yes, ask: "When was the last time?" and "What happened that time?"       8. CAUSE: "What do you think is causing the stomach pain?"     Kidney stones 9. RELIEVING/AGGRAVATING FACTORS: "What makes it better or worse?" (e.g., antacids, bending or twisting motion, bowel movement)     BC powder, and IBU 10. OTHER SYMPTOMS: "Do you have any other symptoms?" (e.g., back pain, diarrhea, fever, urination pain, vomiting)  Protocols used: Abdominal Pain - Male-A-AH

## 2022-03-07 NOTE — Telephone Encounter (Signed)
His most recent ED imaging reveals presence of a kidney stone and he needs to be evaluated for UTI as well.  Hopefully he can get to the mobile unit tomorrow.

## 2022-03-08 NOTE — Telephone Encounter (Signed)
Left message on voicemail to return call.

## 2022-03-17 ENCOUNTER — Other Ambulatory Visit: Payer: Self-pay

## 2022-03-20 ENCOUNTER — Other Ambulatory Visit: Payer: Self-pay

## 2022-05-17 ENCOUNTER — Ambulatory Visit: Payer: Self-pay | Admitting: Family Medicine

## 2023-02-07 ENCOUNTER — Other Ambulatory Visit: Payer: Self-pay

## 2023-02-07 ENCOUNTER — Other Ambulatory Visit: Payer: Self-pay | Admitting: Family Medicine

## 2023-02-07 ENCOUNTER — Telehealth: Payer: Self-pay | Admitting: Family Medicine

## 2023-02-07 DIAGNOSIS — I10 Essential (primary) hypertension: Secondary | ICD-10-CM

## 2023-02-07 DIAGNOSIS — G621 Alcoholic polyneuropathy: Secondary | ICD-10-CM

## 2023-02-07 DIAGNOSIS — K852 Alcohol induced acute pancreatitis without necrosis or infection: Secondary | ICD-10-CM

## 2023-02-07 MED ORDER — GABAPENTIN 300 MG PO CAPS
ORAL_CAPSULE | Freq: Three times a day (TID) | ORAL | 1 refills | Status: AC
  Filled 2023-02-07: qty 90, 30d supply, fill #0
  Filled 2023-03-21: qty 90, 30d supply, fill #1

## 2023-02-07 MED ORDER — LISINOPRIL 2.5 MG PO TABS
2.5000 mg | ORAL_TABLET | Freq: Every day | ORAL | 0 refills | Status: AC
  Filled 2023-02-07: qty 60, 60d supply, fill #0

## 2023-02-07 MED ORDER — OMEPRAZOLE 40 MG PO CPDR
40.0000 mg | DELAYED_RELEASE_CAPSULE | Freq: Every day | ORAL | 1 refills | Status: AC
  Filled 2023-02-07: qty 30, 30d supply, fill #0
  Filled 2023-03-21: qty 30, 30d supply, fill #1

## 2023-02-07 NOTE — Telephone Encounter (Signed)
Pt's call dropped  unable to reach pt.  No answer.

## 2023-02-07 NOTE — Telephone Encounter (Signed)
Patient has upcoming appointment 04/11/23-Courtesy refill extended to reach appointment Requested Prescriptions  Pending Prescriptions Disp Refills   gabapentin (NEURONTIN) 300 MG capsule 90 capsule 1    Sig: TAKE 1 CAPSULE (300 MG TOTAL) BY MOUTH 3 (THREE) TIMES DAILY.     Neurology: Anticonvulsants - gabapentin Passed - 02/07/2023 11:07 AM      Passed - Cr in normal range and within 360 days    Creat  Date Value Ref Range Status  12/29/2019 1.11 0.60 - 1.35 mg/dL Final   Creatinine, Ser  Date Value Ref Range Status  03/05/2022 1.17 0.61 - 1.24 mg/dL Final         Passed - Completed PHQ-2 or PHQ-9 in the last 360 days      Passed - Valid encounter within last 12 months    Recent Outpatient Visits           11 months ago Alcoholic cirrhosis of liver without ascites (HCC)   [place] [facility] Hoy Register, MD   1 year ago Muscle cramps   [place] [facility] Hoy Register, MD   2 years ago LUQ pain   [place] [facility] [place], [place], [place]   3 years ago Hypokalemia   [facility] [place], [place], [place]   3 years ago Chronic alcoholic gastritis without hemorrhage   [place] [facility] [place], [place], [place]       Future Appointments             In 2 months Anders Simmonds, [place] [facility]             omeprazole (PRILOSEC) 40 MG capsule 30 capsule 1    Sig: Take 1 capsule (40 mg total) by mouth daily.     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 ascites (HCC)   [place] [facility] Hoy Register, MD   1 year ago Muscle cramps   [place] [facility] Hoy Register, MD   2 years ago LUQ pain   [facility] [place], [place], [place]   3 years ago Hypokalemia   [facility] [place], [place], [place]   3 years ago Chronic alcoholic gastritis without hemorrhage   [place] [facility] [place], [place], PA-C       Future Appointments             In 2 months Anders Simmonds, [place] [facility]             lisinopril (ZESTRIL) 2.5 MG tablet 60 tablet 0    Sig: Take 1 tablet (2.5 mg total) by mouth daily.     Cardiovascular:  ACE Inhibitors Failed - 02/07/2023 11:07 AM      Failed - Cr in normal range and within 180 days    Creat  Date Value Ref Range Status  12/29/2019 1.11 0.60 - 1.35 mg/dL Final   Creatinine, Ser  Date Value Ref Range Status  03/05/2022 1.17 0.61 - 1.24  mg/dL Final         Failed - K in normal range and within 180 days    Potassium  Date Value Ref Range Status  03/05/2022 3.9 3.5 - 5.1 mmol/L Final         Failed - Last BP in normal range    BP Readings from Last 1 Encounters:  03/06/22 (!) 132/93         Failed - Valid encounter within last 6 months    Recent Outpatient Visits           11 months ago 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 [place], [place], [place]   3 years ago Chronic alcoholic gastritis without hemorrhage   [place] [facility] [place], [place]ella Schlein, [place]       Future Appointments             In 2 months Sharon Seller, [place], PA-C [facility]             Passed - Patient is not pregnant

## 2023-02-07 NOTE — Telephone Encounter (Signed)
Medication Refill - Medication:  gabapentin (NEURONTIN) 300 MG capsule  omeprazole (PRILOSEC) 40 MG capsule  lisinopril (ZESTRIL) 2.5 MG tablet   Has the patient contacted their pharmacy? No. (Agent: If no, request that the patient contact the pharmacy for the refill. If patient does not wish to contact the pharmacy document the reason why and proceed with request.) (Agent: If yes, when and what did the pharmacy advise?)  Preferred Pharmacy (with phone number or street name):  [facility] Phone: [phone]  Fax: [phone]     Has the patient been seen for an appointment in the last year OR does the patient have an upcoming appointment? Yes.    Agent: Please be advised that RX refills may take up to 3 business days. We ask that you follow-up with your pharmacy.

## 2023-02-10 ENCOUNTER — Other Ambulatory Visit: Payer: Self-pay

## 2023-02-10 ENCOUNTER — Encounter (HOSPITAL_COMMUNITY): Payer: Self-pay | Admitting: *Deleted

## 2023-02-10 ENCOUNTER — Emergency Department (HOSPITAL_COMMUNITY): Payer: Self-pay

## 2023-02-10 ENCOUNTER — Emergency Department (HOSPITAL_COMMUNITY)
Admission: EM | Admit: 2023-02-10 | Discharge: 2023-02-10 | Disposition: A | Discharge status: Home / Self Care | Payer: Self-pay | Attending: Emergency Medicine | Admitting: Emergency Medicine

## 2023-02-10 DIAGNOSIS — N201 Calculus of ureter: Secondary | ICD-10-CM | POA: Insufficient documentation

## 2023-02-10 DIAGNOSIS — R109 Unspecified abdominal pain: Secondary | ICD-10-CM

## 2023-02-10 LAB — CBC
HCT: 39.2 % (ref 39.0–52.0)
Hemoglobin: 13.3 g/dL (ref 13.0–17.0)
MCH: 32.8 pg (ref 26.0–34.0)
MCHC: 33.9 g/dL (ref 30.0–36.0)
MCV: 96.6 fL (ref 80.0–100.0)
Platelets: 130 10*3/uL — ABNORMAL LOW (ref 150–400)
RBC: 4.06 MIL/uL — ABNORMAL LOW (ref 4.22–5.81)
RDW: 12.6 % (ref 11.5–15.5)
WBC: 7.2 10*3/uL (ref 4.0–10.5)
nRBC: 0 % (ref 0.0–0.2)

## 2023-02-10 LAB — URINALYSIS, ROUTINE W REFLEX MICROSCOPIC
Bilirubin Urine: NEGATIVE
Glucose, UA: NEGATIVE mg/dL
Hgb urine dipstick: NEGATIVE
Ketones, ur: NEGATIVE mg/dL
Leukocytes,Ua: NEGATIVE
Nitrite: NEGATIVE
Protein, ur: NEGATIVE mg/dL
Specific Gravity, Urine: 1.021 (ref 1.005–1.030)
pH: 6 (ref 5.0–8.0)

## 2023-02-10 LAB — COMPREHENSIVE METABOLIC PANEL
ALT: 23 U/L (ref 0–44)
AST: 27 U/L (ref 15–41)
Albumin: 4 g/dL (ref 3.5–5.0)
Alkaline Phosphatase: 57 U/L (ref 38–126)
Anion gap: 12 (ref 5–15)
BUN: 20 mg/dL (ref 6–20)
CO2: 20 mmol/L — ABNORMAL LOW (ref 22–32)
Calcium: 8.8 mg/dL — ABNORMAL LOW (ref 8.9–10.3)
Chloride: 107 mmol/L (ref 98–111)
Creatinine, Ser: 1.18 mg/dL (ref 0.61–1.24)
GFR, Estimated: >60 mL/min (ref >60)
Glucose, Bld: 95 mg/dL (ref 70–99)
Potassium: 3.6 mmol/L (ref 3.5–5.1)
Sodium: 139 mmol/L (ref 135–145)
Total Bilirubin: 0.6 mg/dL (ref 0.3–1.2)
Total Protein: 7 g/dL (ref 6.5–8.1)

## 2023-02-10 LAB — LIPASE, BLOOD: Lipase: 25 U/L (ref 11–51)

## 2023-02-10 MED ORDER — ONDANSETRON 4 MG PO TBDP
8.0000 mg | ORAL_TABLET | Freq: Once | ORAL | Status: AC
  Administered 2023-02-10: 8 mg via ORAL
  Filled 2023-02-10: qty 2

## 2023-02-10 MED ORDER — OXYCODONE-ACETAMINOPHEN 5-325 MG PO TABS
1.0000 | ORAL_TABLET | Freq: Once | ORAL | Status: AC
  Administered 2023-02-10: 1 via ORAL
  Filled 2023-02-10: qty 1

## 2023-02-10 MED ORDER — TAMSULOSIN HCL 0.4 MG PO CAPS
0.4000 mg | ORAL_CAPSULE | Freq: Every day | ORAL | 0 refills | Status: AC
  Filled 2023-02-10: qty 30, 30d supply, fill #0

## 2023-02-10 MED ORDER — KETOROLAC TROMETHAMINE 15 MG/ML IJ SOLN
15.0000 mg | Freq: Once | INTRAMUSCULAR | Status: AC
  Administered 2023-02-10: 15 mg via INTRAVENOUS
  Filled 2023-02-10: qty 1

## 2023-02-10 MED ORDER — ONDANSETRON HCL 8 MG PO TABS
8.0000 mg | ORAL_TABLET | Freq: Three times a day (TID) | ORAL | 0 refills | Status: AC | PRN
  Filled 2023-02-10: qty 20, 7d supply, fill #0

## 2023-02-10 MED ORDER — OXYCODONE-ACETAMINOPHEN 5-325 MG PO TABS
1.0000 | ORAL_TABLET | Freq: Four times a day (QID) | ORAL | 0 refills | Status: AC | PRN
Start: 2023-02-10 — End: ?
  Filled 2023-02-10: qty 15, 4d supply, fill #0

## 2023-02-10 MED ORDER — FLUORESCEIN SODIUM 1 MG OP STRP
ORAL_STRIP | OPHTHALMIC | Status: AC
  Filled 2023-02-10: qty 1

## 2023-02-10 NOTE — ED Provider Notes (Signed)
[place] EMERGENCY DEPARTMENT AT [facility] Provider Note   CSN: [id number] Arrival date & time: 02/10/23  0037     History  Chief Complaint  Patient presents with   Abdominal Pain    Patrick [name] is a 47 y.o. male.  HPI   47 year old male presents today complaining of left flank pain.  He states this is like prior kidney stones.  Pain has been present for 3 days.  Has taken over-the-counter medications without relief.  Reports that he has had multiple kidney stones in the past but has not followed up with a urologist.  He has a low vision and is unable to see if there is any blood in his urine.  Pain has been constant in nature.  He does state he has had some problems starting his urinary flow over the past week.  He states he is not taking any chronic medications at this time and reports that he is supposed to be on gabapentin and lisinopril.  Home Medications Prior to Admission medications   Medication Sig Start Date End Date Taking? Authorizing Provider  ondansetron (ZOFRAN) 8 MG tablet Take 1 tablet (8 mg total) by mouth every 8 (eight) hours as needed for nausea or vomiting. 02/10/23  Yes Margarita Grizzle, MD  oxyCODONE-acetaminophen (PERCOCET/ROXICET) 5-325 MG tablet Take 1 tablet by mouth every 6 (six) hours as needed for severe pain. 02/10/23  Yes Margarita Grizzle, MD  tamsulosin (FLOMAX) 0.4 MG CAPS capsule Take 1 capsule (0.4 mg total) by mouth daily. 02/10/23  Yes Margarita Grizzle, MD  diclofenac (VOLTAREN) 75 MG EC tablet Take 1 tablet (75 mg total) by mouth 2 (two) times daily. Patient not taking: Reported on 02/14/2022 02/05/22   Elson Areas, PA-C  fluticasone [facility]) 50 MCG/ACT nasal spray Place 2 sprays into both nostrils daily. Patient not taking: Reported on 02/10/2023 12/29/21   Waldon Merl, PA-C  gabapentin (NEURONTIN) 300 MG capsule TAKE 1 CAPSULE (300 MG TOTAL) BY MOUTH 3 (THREE) TIMES DAILY. Patient not taking: Reported on 02/10/2023 02/07/23  02/07/24  Hoy Register, MD  Glecaprevir-Pibrentasvir (MAVYRET) 100-40 MG TABS Take 3 tablets by mouth daily with breakfast. Patient not taking: Reported on 01/11/2022 12/01/19   Kuppelweiser, Cassie L, RPH-CPP  ketorolac (TORADOL) 10 MG tablet Take 1 tablet (10 mg total) by mouth every 6 (six) hours as needed. Patient not taking: Reported on 02/10/2023 03/05/22   Theron Arista, PA-C  lisinopril (ZESTRIL) 2.5 MG tablet Take 1 tablet (2.5 mg total) by mouth daily. Patient not taking: Reported on 02/10/2023 02/07/23   Hoy Register, MD  naltrexone (DEPADE) 50 MG tablet Take 1 tablet (50 mg total) by mouth daily. Patient not taking: Reported on 02/10/2023 02/14/22   Hoy Register, MD  omeprazole (PRILOSEC) 40 MG capsule Take 1 capsule (40 mg total) by mouth daily. Patient not taking: Reported on 02/10/2023 02/07/23   Hoy Register, MD  potassium chloride SA (KLOR-CON) 20 MEQ tablet TAKE 1 TABLET (20 MEQ TOTAL) BY MOUTH DAILY. Patient taking differently: Take 20 mEq by mouth daily. 01/15/20 01/14/21  Anders Simmonds, PA-C  sucralfate (CARAFATE) 1 g tablet Take 1 tablet (1 g total) by mouth 4 (four) times daily -  with meals and at bedtime. Patient not taking: Reported on 02/14/2022 11/17/21   Hoy Register, MD      Allergies    Mushroom extract complex    Review of Systems   Review of Systems  Physical Exam Updated Vital Signs BP 107/66  Pulse 80   Temp 97.8 F (36.6 C)   Resp 13   Ht 1.803 m (5\' 11" )   Wt 95 kg   SpO2 97%   BMI 29.21 kg/m  Physical Exam Vitals and nursing note reviewed.  Constitutional:      Appearance: He is well-developed.  HENT:     Head: Normocephalic.  Cardiovascular:     Rate and Rhythm: Normal rate and regular rhythm.  Pulmonary:     Effort: Pulmonary effort is normal.  Abdominal:     General: Abdomen is flat. Bowel sounds are normal.     Palpations: Abdomen is soft.     Tenderness: There is abdominal tenderness in the periumbilical area and left  lower quadrant.  Skin:    General: Skin is warm.  Neurological:     General: No focal deficit present.     Mental Status: He is alert.     ED Results / Procedures / Treatments   Labs (all labs ordered are listed, but only abnormal results are displayed) Labs Reviewed  COMPREHENSIVE METABOLIC PANEL - Abnormal; Notable for the following components:      Result Value   CO2 20 (*)    Calcium 8.8 (*)    All other components within normal limits  CBC - Abnormal; Notable for the following components:   RBC 4.06 (*)    Platelets 130 (*)    All other components within normal limits  LIPASE, BLOOD  URINALYSIS, ROUTINE W REFLEX MICROSCOPIC    EKG None  Radiology CT Renal Stone Study  Result Date: 02/10/2023 CLINICAL DATA:  Abdominal pain/flank pain. Evaluate for kidney stone. EXAM: CT ABDOMEN AND PELVIS WITHOUT CONTRAST TECHNIQUE: Multidetector CT imaging of the abdomen and pelvis was performed following the standard protocol without IV contrast. RADIATION DOSE REDUCTION: This exam was performed according to the departmental dose-optimization program which includes automated exposure control, adjustment of the mA and/or kV according to patient size and/or use of iterative reconstruction technique. COMPARISON:  03/05/2022 FINDINGS: Lower chest: Dependent changes with subsequent pleural ground-glass attenuation is noted in the lung bases. No pleural fluid or consolidative change. Hepatobiliary: Contour of the liver is slightly nodular. There is relative hypertrophy of the caudate lobe and lateral segment of left hepatic lobe. Small stones noted layering within the dependent portion of the gallbladder. No wall thickening or inflammation. Pancreas: Unremarkable. No pancreatic ductal dilatation or surrounding inflammatory changes. Spleen: The spleen measures 18.4 cm in cranial caudal dimension. No focal splenic lesion. Adrenals/Urinary Tract: Normal adrenal glands. No nephrolithiasis, hydronephrosis  or suspicious mass. 5 mm exophytic hyperdensity off the inferior cortex of the left kidney is noted, image 47/3. This appears unchanged compared with the previous exam and is too small to reliably characterize. 8 mm stone within the distal right ureter just before the UVJ is identified, image 84/3. this does not result in significant right hydronephrosis or hydroureter. Urinary bladder is unremarkable. Stomach/Bowel: Stomach appears normal. No dilated loops of large or small bowel. The appendix is visualized and is within normal limits. Vascular/Lymphatic: Aortic atherosclerosis. No signs of abdominopelvic adenopathy. Reproductive: Prostate is unremarkable. Other: No free fluid or fluid collections. No signs of pneumoperitoneum. Musculoskeletal: Remote healed left inferior pubic rami fracture. 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 IMPRESSION: 1. There is an 8 mm stone within the distal right ureter just before the UVJ. This does not result in significant right hydronephrosis or hydroureter. 2. Cirrhosis with splenomegaly. 3. Cholelithiasis. 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 5.  Aortic Atherosclerosis (ICD10-I70.0). Electronically Signed   By: Signa Kell M.D.   On: 02/10/2023 10:38    Procedures Procedures    Medications Ordered in ED Medications  fluorescein 1 MG ophthalmic strip (has no administration in time range)  oxyCODONE-acetaminophen (PERCOCET/ROXICET) 5-325 MG per tablet 1 tablet (1 tablet Oral Given 02/10/23 0121)  ondansetron (ZOFRAN-ODT) disintegrating tablet 8 mg (8 mg Oral Given 02/10/23 0119)  ketorolac (TORADOL) 15 MG/ML injection 15 mg (15 mg Intravenous Given 02/10/23 0948)    ED Course/ Medical Decision Making/ A&P Clinical Course as of 02/10/23 1112  Sat Feb 10, 2023  1105 CT reviewed and interpreted and sent for right ureteral stone Radiologist interpretation notes 8 mm stone in the distal right ureter just before the UVJ.  [DR]  1105 Complete metabolic panel  reviewed interpreted and mild decrease CO2 and calcium otherwise within normal limits CBC reviewed interpreted normal Urinalysis viewed interpreted and within normal limits [DR]    Clinical Course User Index [DR] Margarita Grizzle, MD                                 Medical Decision Making Amount and/or Complexity of Data Reviewed Radiology: ordered.  Risk Prescription drug management.   47 year old male history of kidney stones presents today with left flank pain which he identifies as similar to prior renal colic.  Patient was evaluated here with CBC, complete metabolic panel and lipase which are significant for mild thrombocytopenia otherwise appear to be within normal limits and with urinalysis which is negative for any signs or symptoms of infection or hematuria Differential diagnosis includes but is not limited to renal colic, UTI, diverticulitis, abdominal aortic aneurysm and dissection, musculoskeletal etiology.  Doubt UTI with normal urinalysis.  Doubt diverticulitis with no other symptoms and not a change in appetite or stooling.  Doubt abdominal aortic aneurysm with no midline tenderness and equal and normal pulses Patient evaluated with CT stone study.  He appears to have a right distal ureteral stone.  He is complaining of pain on the left.  Is unclear if the symptoms he is having correlate with the CT findings.  Patient will be started on some pain medicines and antiemetics.  There is no evidence of acute renal dysfunction or kidney structure with hydronephrosis or infection However, based on the 8 mm stone finding he will need urology follow-up.  This is discussed with the patient.  We discussed the need for close urological follow-up and return precautions [facility] will send his medications to the [facility] on [address].       Final Clinical Impression(s) / ED Diagnoses Final diagnoses:  Left flank  pain  Abdominal pain, unspecified abdominal location  Ureteral stone    Rx / DC Orders ED Discharge Orders          Ordered    oxyCODONE-acetaminophen (PERCOCET/ROXICET) 5-325 MG tablet  Every 6 hours PRN        02/10/23 1111    ondansetron (ZOFRAN) 8 MG tablet  Every 8 hours PRN        02/10/23 1111    tamsulosin (FLOMAX) 0.4 MG CAPS capsule  Daily        02/10/23 1111              Margarita Grizzle, MD 02/10/23 1112

## 2023-02-10 NOTE — ED Triage Notes (Signed)
The pt has had problems urinating for the past 3 weeks his lt flank pain started one week ago  no blood in urine but he sometimes cannot urinate when he wants to.  Hx of  kidney stones

## 2023-02-10 NOTE — Discharge Instructions (Addendum)
You have a large kidney stone on the right side. Please call the urologist on Monday for follow-up this week. Return if you are having pain that is not trouble with pain medicine or inability to urinate.  Please drink plenty of fluids and take medication as prescribed

## 2023-02-12 ENCOUNTER — Other Ambulatory Visit: Payer: Self-pay

## 2023-02-14 ENCOUNTER — Telehealth: Payer: Self-pay

## 2023-02-14 NOTE — Telephone Encounter (Signed)
Copied from CRM [id number]. Topic: Referral - Request for Referral >> Feb 07, 2023 10:27 AM Clide Dales wrote: Has patient seen PCP for this complaint? Yes.   *If NO, is insurance requiring patient see PCP for this issue before PCP can refer them? Referral for which specialty: Urology Preferred provider/office:  Reason for referral: Kidney stones

## 2023-03-13 ENCOUNTER — Other Ambulatory Visit: Payer: Self-pay

## 2023-03-13 ENCOUNTER — Emergency Department (HOSPITAL_COMMUNITY)
Admission: EM | Admit: 2023-03-13 | Discharge: 2023-03-14 | Disposition: A | Discharge status: Home / Self Care | Payer: Self-pay | Attending: Emergency Medicine | Admitting: Emergency Medicine

## 2023-03-13 DIAGNOSIS — Z79899 Other long term (current) drug therapy: Secondary | ICD-10-CM | POA: Insufficient documentation

## 2023-03-13 DIAGNOSIS — I10 Essential (primary) hypertension: Secondary | ICD-10-CM | POA: Insufficient documentation

## 2023-03-13 DIAGNOSIS — R109 Unspecified abdominal pain: Secondary | ICD-10-CM | POA: Insufficient documentation

## 2023-03-13 NOTE — ED Triage Notes (Signed)
Pt arrives to ED c/o left sided flank pain x 1 day. Pt reports frequent urination and blood in urine. Pt endorses previous hx of kidney stones. Pt denies N/V.

## 2023-03-14 ENCOUNTER — Emergency Department (HOSPITAL_COMMUNITY): Payer: Self-pay

## 2023-03-14 LAB — URINALYSIS, ROUTINE W REFLEX MICROSCOPIC
Bilirubin Urine: NEGATIVE
Glucose, UA: NEGATIVE mg/dL
Hgb urine dipstick: NEGATIVE
Ketones, ur: NEGATIVE mg/dL
Leukocytes,Ua: NEGATIVE
Nitrite: NEGATIVE
Protein, ur: NEGATIVE mg/dL
Specific Gravity, Urine: 1.024 (ref 1.005–1.030)
pH: 6 (ref 5.0–8.0)

## 2023-03-14 LAB — MAGNESIUM: Magnesium: 1.7 mg/dL (ref 1.7–2.4)

## 2023-03-14 LAB — LIPASE, BLOOD: Lipase: 24 U/L (ref 11–51)

## 2023-03-14 MED ORDER — OXYCODONE-ACETAMINOPHEN 5-325 MG PO TABS
1.0000 | ORAL_TABLET | Freq: Once | ORAL | Status: AC
  Administered 2023-03-14: 1 via ORAL
  Filled 2023-03-14: qty 1

## 2023-03-14 MED ORDER — KETOROLAC TROMETHAMINE 60 MG/2ML IM SOLN
30.0000 mg | Freq: Once | INTRAMUSCULAR | Status: AC
  Administered 2023-03-14: 30 mg via INTRAMUSCULAR
  Filled 2023-03-14: qty 2

## 2023-03-14 MED ORDER — ONDANSETRON 4 MG PO TBDP
4.0000 mg | ORAL_TABLET | Freq: Once | ORAL | Status: AC
  Administered 2023-03-14: 4 mg via ORAL
  Filled 2023-03-14: qty 1

## 2023-03-14 NOTE — ED Provider Notes (Signed)
EMERGENCY DEPARTMENT AT [facility] Provider Note   CSN: [id number] Arrival date & time: 03/13/23  2348     History  Chief Complaint  Patient presents with   Flank Pain    Patrick [name] is a 47 y.o. male.   Flank Pain  Patient presents for left-sided flank pain.  Medical history includes migraines, HTN, pancreatitis, nephrolithiasis.  He was seen in the ED a month ago for similar symptoms.  He had 8 mm stone on right UVJ at the time.  Over the past week, he has had pain in the left flank.  Pain worsened today.  It radiates to left testicle.  He denies any recent testicular swelling or penile discharge.  He has had some hematuria urinary frequency, but this improved today.  He took some Aleve earlier in the day.  He has had nausea without vomiting.     Home Medications Prior to Admission medications   Medication Sig Start Date End Date Taking? Authorizing Provider  diclofenac (VOLTAREN) 75 MG EC tablet Take 1 tablet (75 mg total) by mouth 2 (two) times daily. Patient not taking: Reported on 02/14/2022 02/05/22   Elson Areas, PA-C  fluticasone [facility]) 50 MCG/ACT nasal spray Place 2 sprays into both nostrils daily. Patient not taking: Reported on 02/10/2023 12/29/21   Waldon Merl, PA-C  gabapentin (NEURONTIN) 300 MG capsule TAKE 1 CAPSULE (300 MG TOTAL) BY MOUTH 3 (THREE) TIMES DAILY. Patient not taking: Reported on 02/10/2023 02/07/23 02/07/24  Hoy Register, MD  Glecaprevir-Pibrentasvir (MAVYRET) 100-40 MG TABS Take 3 tablets by mouth daily with breakfast. Patient not taking: Reported on 01/11/2022 12/01/19   Kuppelweiser, Cassie L, RPH-CPP  ketorolac (TORADOL) 10 MG tablet Take 1 tablet (10 mg total) by mouth every 6 (six) hours as needed. Patient not taking: Reported on 02/10/2023 03/05/22   Theron Arista, PA-C  lisinopril (ZESTRIL) 2.5 MG tablet Take 1 tablet (2.5 mg total) by mouth daily. Patient not taking: Reported on 02/10/2023 02/07/23   Hoy Register, MD  naltrexone (DEPADE) 50 MG tablet Take 1 tablet (50 mg total) by mouth daily. Patient not taking: Reported on 02/10/2023 02/14/22   Hoy Register, MD  omeprazole (PRILOSEC) 40 MG capsule Take 1 capsule (40 mg total) by mouth daily. Patient not taking: Reported on 02/10/2023 02/07/23   Hoy Register, MD  ondansetron (ZOFRAN) 8 MG tablet Take 1 tablet (8 mg total) by mouth every 8 (eight) hours as needed for nausea or vomiting. 02/10/23   Margarita Grizzle, MD  oxyCODONE-acetaminophen (PERCOCET/ROXICET) 5-325 MG tablet Take 1 tablet by mouth every 6 (six) hours as needed for severe pain. 02/10/23   Margarita Grizzle, MD  potassium chloride SA (KLOR-CON) 20 MEQ tablet TAKE 1 TABLET (20 MEQ TOTAL) BY MOUTH DAILY. Patient taking differently: Take 20 mEq by mouth daily. 01/15/20 01/14/21  Anders Simmonds, PA-C  sucralfate (CARAFATE) 1 g tablet Take 1 tablet (1 g total) by mouth 4 (four) times daily -  with meals and at bedtime. Patient not taking: Reported on 02/14/2022 11/17/21   Hoy Register, MD  tamsulosin (FLOMAX) 0.4 MG CAPS capsule Take 1 capsule (0.4 mg total) by mouth daily. 02/10/23   Margarita Grizzle, MD      Allergies    Mushroom extract complex    Review of Systems   Review of Systems  Gastrointestinal:  Positive for nausea.  Genitourinary:  Positive for flank pain and testicular pain.  All other systems reviewed and are negative.  Physical Exam Updated Vital Signs BP 116/72 (BP Location: Right Arm)   Pulse 87   Temp 97.6 F (36.4 C) (Oral)   Resp 17   Ht 5\' 11"  (1.803 m)   Wt 90.7 kg   SpO2 94%   BMI 27.89 kg/m  Physical Exam Vitals and nursing note reviewed. Exam conducted with a chaperone present.  Constitutional:      General: He is not in acute distress.    Appearance: Normal appearance. He is well-developed. He is not ill-appearing, toxic-appearing or diaphoretic.  HENT:     Head: Normocephalic and atraumatic.     Right Ear: External ear normal.     Left  Ear: External ear normal.     Nose: Nose normal.     Mouth/Throat:     Mouth: Mucous membranes are moist.  Eyes:     Extraocular Movements: Extraocular movements intact.     Conjunctiva/sclera: Conjunctivae normal.  Cardiovascular:     Rate and Rhythm: Normal rate and regular rhythm.  Pulmonary:     Effort: Pulmonary effort is normal. No respiratory distress.  Abdominal:     General: There is no distension.     Palpations: Abdomen is soft.     Tenderness: There is no abdominal tenderness.  Genitourinary:    Testes:        Right: Tenderness or swelling not present.        Left: Tenderness or swelling not present.  Musculoskeletal:        General: No swelling. Normal range of motion.     Cervical back: Normal range of motion and neck supple.  Skin:    General: Skin is warm and dry.     Coloration: Skin is not jaundiced or pale.  Neurological:     General: No focal deficit present.     Mental Status: He is alert and oriented to person, place, and time.  Psychiatric:        Mood and Affect: Mood normal.        Behavior: Behavior normal.     ED Results / Procedures / Treatments   Labs (all labs ordered are listed, but only abnormal results are displayed) Labs Reviewed  URINALYSIS, ROUTINE W REFLEX MICROSCOPIC  LIPASE, BLOOD  MAGNESIUM  CBC  COMPREHENSIVE METABOLIC PANEL    EKG None  Radiology CT Renal Stone Study  Result Date: 03/14/2023 CLINICAL DATA:  Left-sided flank pain. EXAM: CT ABDOMEN AND PELVIS WITHOUT CONTRAST TECHNIQUE: Multidetector CT imaging of the abdomen and pelvis was performed following the standard protocol without IV contrast. RADIATION DOSE REDUCTION: This exam was performed according to the departmental dose-optimization program which includes automated exposure control, adjustment of the mA and/or kV according to patient size and/or use of iterative reconstruction technique. COMPARISON:  February 10, 2023 FINDINGS: Lower chest: No acute  abnormality. Hepatobiliary: No focal liver abnormality is seen. A thin layer tiny gallstones is seen within the lumen of an otherwise normal-appearing gallbladder. There is no evidence of biliary dilatation. Pancreas: Unremarkable. No pancreatic ductal dilatation or surrounding inflammatory changes. Spleen: There is stable splenomegaly without focal splenic lesions. Adrenals/Urinary Tract: Adrenal glands are unremarkable. Kidneys are normal in size, without renal calculi or hydronephrosis. A stable 5 mm mildly hyperdense exophytic lesion is seen within the lateral aspect of the lower pole of the left kidney (axial CT image 37, CT series 2). Mild, stable bilateral nonspecific perinephric inflammatory fat stranding is noted. Bladder is unremarkable. Stomach/Bowel: Stomach is within normal limits. Appendix appears  normal. No evidence of bowel wall thickening, distention, or inflammatory changes. Noninflamed diverticula are seen within the proximal to mid sigmoid colon. Vascular/Lymphatic: Aortic atherosclerosis. No enlarged abdominal or pelvic lymph nodes. Reproductive: Prostate is unremarkable. Other: No abdominal wall hernia or abnormality. No abdominopelvic ascites. Musculoskeletal: A chronic compression fracture deformity is seen at the level of L2. No acute osseous abnormalities are identified. IMPRESSION: 1. Cholelithiasis. 2. Sigmoid diverticulosis. 3. 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 4. Aortic atherosclerosis. Aortic Atherosclerosis (ICD10-I70.0). Electronically Signed   By: Aram Candela M.D.   On: 03/14/2023 04:13   DG Abdomen 1 View  Result Date: 03/14/2023 CLINICAL DATA:  Left flank pain x1 day. EXAM: ABDOMEN - 1 VIEW COMPARISON:  None Available. FINDINGS: The bowel gas pattern is normal. A moderate to large amount of stool is seen throughout the colon. No radio-opaque calculi or other significant radiographic abnormality are seen. IMPRESSION: Moderate to large stool  burden without evidence of bowel obstruction. Electronically Signed   By: Aram Candela M.D.   On: 03/14/2023 02:28    Procedures Procedures    Medications Ordered in ED Medications  ondansetron (ZOFRAN-ODT) disintegrating tablet 4 mg (4 mg Oral Given 03/14/23 0331)  oxyCODONE-acetaminophen (PERCOCET/ROXICET) 5-325 MG per tablet 1 tablet (1 tablet Oral Given 03/14/23 0331)  ketorolac (TORADOL) injection 30 mg (30 mg Intramuscular Given 03/14/23 0330)    ED Course/ Medical Decision Making/ A&P                                 Medical Decision Making Amount and/or Complexity of Data Reviewed Labs: ordered. Radiology: ordered.  Risk Prescription drug management.   This patient presents to the ED for concern of left leg pain, this involves an extensive number of treatment options, and is a complaint that carries with it a high risk of complications and morbidity.  The differential diagnosis includes nephrolithiasis, UTI, constipation, occult injury   Co morbidities that complicate the patient evaluation  migraines, HTN, pancreatitis, nephrolithiasis   Additional history obtained:  Additional history obtained from N/A External records from outside source obtained and reviewed including EMR   Lab Tests:  I Ordered, and personally interpreted labs.  The pertinent results include: No evidence of UTI or hematuria.   Imaging Studies ordered:  I ordered imaging studies including CT stone study I independently visualized and interpreted imaging which showed no acute findings I agree with the radiologist interpretation   Cardiac Monitoring: / EKG:  The patient was maintained on a cardiac monitor.  I personally viewed and interpreted the cardiac monitored which showed an underlying rhythm of: Sinus rhythm  Problem List / ED Course / Critical interventions / Medication management  Patient presents for left-sided flank pain and nausea.  Symptoms have been present over the  past week but worsened today.  He has had recent hematuria.  On exam, he is well-appearing.  There is no testicular swelling or tenderness.  Urinalysis does not show evidence of hematuria or infection.  CT stone study was ordered.  Percocet and Toradol were ordered for analgesia.  Zofran was ordered for nausea.  Patient with stone study which did not show any acute findings.  He had improved symptoms while in the ED.  He was discharged in stable condition. I ordered medication including Percocet, Toradol for analgesia; Zofran for nausea Reevaluation of the patient after these medicines showed that the patient improved I have reviewed  the patients home medicines and have made adjustments as needed         Final Clinical Impression(s) / ED Diagnoses Final diagnoses:  Left flank pain    Rx / DC Orders ED Discharge Orders     None         Gloris Manchester, MD 03/14/23 [phone]

## 2023-03-14 NOTE — Discharge Instructions (Signed)
Test results today are reassuring.  Take ibuprofen and Tylenol as needed for pain and soreness.  Return to the emergency department for any new or worsening symptoms of concern.

## 2023-03-14 NOTE — ED Notes (Signed)
Patient transported to CT 

## 2023-03-14 NOTE — ED Notes (Signed)
Pt unable to urinate at this time MD Dixon at bedside

## 2023-03-21 ENCOUNTER — Other Ambulatory Visit: Payer: Self-pay

## 2023-04-11 ENCOUNTER — Ambulatory Visit: Payer: Self-pay | Admitting: Physician Assistant

## 2023-04-11 NOTE — Progress Notes (Unsigned)
Patient ID: Patrick [name], male   DOB: [birth year], 47 y.o.   MRN: [id number]    Flank Pain   Patient presents for left-sided flank pain.  Medical history includes migraines, HTN, pancreatitis, nephrolithiasis.  He was seen in the ED a month ago for similar symptoms.  He had 8 mm stone on right UVJ at the time.  Over the past week, he has had pain in the left flank.  Pain worsened today.  It radiates to left testicle.  He denies any recent testicular swelling or penile discharge.  He has had some hematuria urinary frequency, but this improved today.  He took some Aleve earlier in the day.  He has had nausea without vomiting   Medical Decision Making Amount and/or Complexity of Data Reviewed Labs: ordered. Radiology: ordered.   Risk Prescription drug management.     This patient presents to the ED for concern of left leg pain, this involves an extensive number of treatment options, and is a complaint that carries with it a high risk of complications and morbidity.  The differential diagnosis includes nephrolithiasis, UTI, constipation, occult injury     Co morbidities that complicate the patient evaluation   migraines, HTN, pancreatitis, nephrolithiasis     Additional history obtained:   Additional history obtained from N/A External records from outside source obtained and reviewed including EMR     Lab Tests:   I Ordered, and personally interpreted labs.  The pertinent results include: No evidence of UTI or hematuria.     Imaging Studies ordered:   I ordered imaging studies including CT stone study I independently visualized and interpreted imaging which showed no acute findings I agree with the radiologist interpretation     Cardiac Monitoring: / EKG:   The patient was maintained on a cardiac monitor.  I personally viewed and interpreted the cardiac monitored which showed an underlying rhythm of: Sinus rhythm   Problem List / ED Course / Critical interventions / Medication  management   Patient presents for left-sided flank pain and nausea.  Symptoms have been present over the past week but worsened today.  He has had recent hematuria.  On exam, he is well-appearing.  There is no testicular swelling or tenderness.  Urinalysis does not show evidence of hematuria or infection.  CT stone study was ordered.  Percocet and Toradol were ordered for analgesia.  Zofran was ordered for nausea.  Patient with stone study which did not show any acute findings.  He had improved symptoms while in the ED.  He was discharged in stable condition. I ordered medication including Percocet, Toradol for analgesia; Zofran for nausea Reevaluation of the patient after these medicines showed that the patient improved I have reviewed the patients home medicines and have made adjustments as needed    CT renal study findings: IMPRESSION: 1. Cholelithiasis. 2. Sigmoid diverticulosis. 3. 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 4. Aortic atherosclerosis.
